# Patient Record
Sex: Female | Born: 1940 | Race: White | Hispanic: No | Smoking: Former smoker
Health system: Southern US, Community
[De-identification: ages and names within clinical notes are randomized; demographics above are authoritative.]

## PROBLEM LIST (undated history)

## (undated) DIAGNOSIS — IMO0001 Reserved for inherently not codable concepts without codable children: Secondary | ICD-10-CM

## (undated) DIAGNOSIS — Z9289 Personal history of other medical treatment: Secondary | ICD-10-CM

## (undated) DIAGNOSIS — D649 Anemia, unspecified: Secondary | ICD-10-CM

## (undated) DIAGNOSIS — E039 Hypothyroidism, unspecified: Secondary | ICD-10-CM

## (undated) DIAGNOSIS — F17200 Nicotine dependence, unspecified, uncomplicated: Secondary | ICD-10-CM

## (undated) DIAGNOSIS — C801 Malignant (primary) neoplasm, unspecified: Secondary | ICD-10-CM

## (undated) DIAGNOSIS — J189 Pneumonia, unspecified organism: Secondary | ICD-10-CM

## (undated) DIAGNOSIS — C3490 Malignant neoplasm of unspecified part of unspecified bronchus or lung: Secondary | ICD-10-CM

## (undated) DIAGNOSIS — E785 Hyperlipidemia, unspecified: Secondary | ICD-10-CM

## (undated) DIAGNOSIS — F419 Anxiety disorder, unspecified: Secondary | ICD-10-CM

## (undated) DIAGNOSIS — E119 Type 2 diabetes mellitus without complications: Secondary | ICD-10-CM

## (undated) DIAGNOSIS — I10 Essential (primary) hypertension: Secondary | ICD-10-CM

## (undated) HISTORY — DX: Hyperlipidemia, unspecified: E78.5

## (undated) HISTORY — DX: Pneumonia, unspecified organism: J18.9

## (undated) HISTORY — DX: Type 2 diabetes mellitus without complications: E11.9

## (undated) HISTORY — DX: Essential (primary) hypertension: I10

## (undated) HISTORY — DX: Hypothyroidism, unspecified: E03.9

## (undated) HISTORY — DX: Anxiety disorder, unspecified: F41.9

## (undated) HISTORY — DX: Nicotine dependence, unspecified, uncomplicated: F17.200

## (undated) HISTORY — PX: EYE SURGERY: SHX253

---

## 2005-02-11 DIAGNOSIS — J189 Pneumonia, unspecified organism: Secondary | ICD-10-CM

## 2005-02-11 HISTORY — DX: Pneumonia, unspecified organism: J18.9

## 2014-09-26 DIAGNOSIS — C3491 Malignant neoplasm of unspecified part of right bronchus or lung: Secondary | ICD-10-CM | POA: Insufficient documentation

## 2014-10-13 ENCOUNTER — Ambulatory Visit (HOSPITAL_BASED_OUTPATIENT_CLINIC_OR_DEPARTMENT_OTHER): Payer: Medicare Other | Admitting: Hematology

## 2014-10-13 ENCOUNTER — Other Ambulatory Visit: Payer: Self-pay | Admitting: Hematology

## 2014-10-13 ENCOUNTER — Ambulatory Visit: Payer: Self-pay | Admitting: Internal Medicine

## 2014-10-13 ENCOUNTER — Telehealth: Payer: Self-pay | Admitting: Hematology

## 2014-10-13 ENCOUNTER — Other Ambulatory Visit: Payer: Self-pay

## 2014-10-13 ENCOUNTER — Telehealth: Payer: Self-pay | Admitting: *Deleted

## 2014-10-13 ENCOUNTER — Encounter: Payer: Self-pay | Admitting: Hematology

## 2014-10-13 ENCOUNTER — Other Ambulatory Visit (HOSPITAL_BASED_OUTPATIENT_CLINIC_OR_DEPARTMENT_OTHER): Payer: Medicare Other

## 2014-10-13 ENCOUNTER — Other Ambulatory Visit: Payer: Self-pay | Admitting: *Deleted

## 2014-10-13 VITALS — BP 126/61 | HR 78 | Temp 98.4°F | Resp 18 | Ht 62.0 in | Wt 140.6 lb

## 2014-10-13 DIAGNOSIS — E46 Unspecified protein-calorie malnutrition: Secondary | ICD-10-CM

## 2014-10-13 DIAGNOSIS — C781 Secondary malignant neoplasm of mediastinum: Secondary | ICD-10-CM

## 2014-10-13 DIAGNOSIS — E875 Hyperkalemia: Secondary | ICD-10-CM

## 2014-10-13 DIAGNOSIS — C3490 Malignant neoplasm of unspecified part of unspecified bronchus or lung: Secondary | ICD-10-CM

## 2014-10-13 DIAGNOSIS — E039 Hypothyroidism, unspecified: Secondary | ICD-10-CM | POA: Diagnosis not present

## 2014-10-13 DIAGNOSIS — F419 Anxiety disorder, unspecified: Secondary | ICD-10-CM

## 2014-10-13 LAB — COMPREHENSIVE METABOLIC PANEL (CC13)
ALBUMIN: 3.6 g/dL (ref 3.5–5.0)
ALK PHOS: 42 U/L (ref 40–150)
ALT: 16 U/L (ref 0–55)
AST: 16 U/L (ref 5–34)
Anion Gap: 7 mEq/L (ref 3–11)
BILIRUBIN TOTAL: 0.45 mg/dL (ref 0.20–1.20)
BUN: 16.3 mg/dL (ref 7.0–26.0)
CO2: 28 meq/L (ref 22–29)
Calcium: 10.2 mg/dL (ref 8.4–10.4)
Chloride: 101 mEq/L (ref 98–109)
Creatinine: 1 mg/dL (ref 0.6–1.1)
EGFR: 57 mL/min/{1.73_m2} — ABNORMAL LOW (ref >90)
GLUCOSE: 132 mg/dL (ref 70–140)
Potassium: 5.6 mEq/L — ABNORMAL HIGH (ref 3.5–5.1)
SODIUM: 136 meq/L (ref 136–145)
TOTAL PROTEIN: 6.6 g/dL (ref 6.4–8.3)

## 2014-10-13 LAB — CBC & DIFF AND RETIC
BASO%: 0.2 % (ref 0.0–2.0)
BASOS ABS: 0 10*3/uL (ref 0.0–0.1)
EOS%: 0.9 % (ref 0.0–7.0)
Eosinophils Absolute: 0.1 10*3/uL (ref 0.0–0.5)
HEMATOCRIT: 35.6 % (ref 34.8–46.6)
HGB: 12.2 g/dL (ref 11.6–15.9)
IMMATURE RETIC FRACT: 4.7 % (ref 1.60–10.00)
LYMPH#: 1.6 10*3/uL (ref 0.9–3.3)
LYMPH%: 12.2 % — AB (ref 14.0–49.7)
MCH: 30.3 pg (ref 25.1–34.0)
MCHC: 34.3 g/dL (ref 31.5–36.0)
MCV: 88.6 fL (ref 79.5–101.0)
MONO#: 0.7 10*3/uL (ref 0.1–0.9)
MONO%: 5.6 % (ref 0.0–14.0)
NEUT#: 10.8 10*3/uL — ABNORMAL HIGH (ref 1.5–6.5)
NEUT%: 81.1 % — AB (ref 38.4–76.8)
PLATELETS: 189 10*3/uL (ref 145–400)
RBC: 4.02 10*6/uL (ref 3.70–5.45)
RDW: 14.1 % (ref 11.2–14.5)
RETIC CT ABS: 67.13 10*3/uL (ref 33.70–90.70)
Retic %: 1.67 % (ref 0.70–2.10)
WBC: 13.3 10*3/uL — ABNORMAL HIGH (ref 3.9–10.3)

## 2014-10-13 LAB — LACTATE DEHYDROGENASE (CC13): LDH: 189 U/L (ref 125–245)

## 2014-10-13 LAB — MAGNESIUM (CC13): Magnesium: 2 mg/dl (ref 1.5–2.5)

## 2014-10-13 MED ORDER — ONDANSETRON HCL 4 MG PO TABS: 4.0000 mg | ORAL_TABLET | Freq: Three times a day (TID) | ORAL | Status: DC | PRN

## 2014-10-13 MED ORDER — LORAZEPAM 0.5 MG PO TABS: 0.5000 mg | ORAL_TABLET | Freq: Three times a day (TID) | ORAL | Status: DC | PRN

## 2014-10-13 MED ORDER — CITALOPRAM HYDROBROMIDE 10 MG PO TABS: 10.0000 mg | ORAL_TABLET | Freq: Every day | ORAL | Status: DC

## 2014-10-13 NOTE — Progress Notes (Signed)
Patient suffers from hip pains and ambulatory issues which impairs their ability to perform daily activities like ambulating in the home. A walker, can, or crutch will not resolve  issue with performing activities of daily living. A wheelchair will allow patient to safely perform daily activities. Patient is not able to propel themselves in the home using a standard weight wheelchair due to weakness. Patient can self propel in the lightweight wheelchair.  Accessories: elevating leg rests (ELRs), wheel locks, extensions and anti-tippers.

## 2014-10-13 NOTE — Telephone Encounter (Signed)
Gave patient avs report and appointments for September including scans. Tx added per pof - no care plan at this time. Due to holiday and dates for scans and port tx to start 10/24/14 - GK aware

## 2014-10-13 NOTE — Telephone Encounter (Signed)
Per staff message and POF I have scheduled appts. Advised scheduler of appts. JMW  

## 2014-10-13 NOTE — Telephone Encounter (Signed)
per [place] will call to set up scans with pt

## 2014-10-13 NOTE — Telephone Encounter (Signed)
New chemo approved per maggie/lashonya and will have patient get a new schedule 9/2 at chemo class

## 2014-10-14 ENCOUNTER — Other Ambulatory Visit: Payer: Medicare Other

## 2014-10-17 ENCOUNTER — Encounter: Payer: Self-pay | Admitting: Hematology

## 2014-10-17 NOTE — Progress Notes (Signed)
Marland Kitchen    HEMATOLOGY/ONCOLOGY CONSULTATION NOTE  Date of Service: 10/17/2014  CHIEF COMPLAINTS/PURPOSE OF CONSULTATION:  Newly diagnosed small cell carcinoma of the lung for further evaluation and management.  HISTORY OF PRESENTING ILLNESS:  Rachel Chapman is a wonderful 74 y.o. female who has been referred to [place] by Dr Marikay Alar Kopstick and Dr Man Bernie Covey of [facility] further evaluation and management of newly diagnosed small cell lung cancer.  Patient has a history of heavy smoking one and 1/2 pack per day for 60 years, hypertension, dyslipidemia, diabetes, hypothyroidism and significant anxiety.   She presented to the [facility] in [place] with 1-2 months of progressive shortness of breath, fatigue and weakness, dizziness and dehydration with a weight loss of about 15-20 pounds. She had a CT scan of the chest on 09/26/2014 which showed enlarged mediastinal lymph nodes.  She subsequently had a bronchoscopic biopsy of the subcarinal lymph nodes which were report showed a small cell lung carcinoma.  She subsequently moved from [place] to [place] to live with her daughter and son and get her further treatment here.  Patient is noted to be very anxious and notes that she even gets jittery.  She does not have an official diagnosis of COPD but notes that she was prescribed albuterol inhaler which she has been using as needed. She notes that she quit smoking about one month ago.  She notes that she just moved and is living with her daughter she is eating a little better and has gained back some of her lost weight.  She notes no headaches no focal neurological deficits.  Notes some element of anorexia and fatigue. It is very anxious about what additional workup and treatment she might need as requesting some antianxiety medicines.     MEDICAL HISTORY:  Past Medical History  Diagnosis Date  . Hypertension   . Dyslipidemia   . Diabetes   .  Hypothyroidism   . Pneumonia 2007  . Smoker     1.5 packs per day for 60 years.  Quit one month ago  . Anxiety     SURGICAL HISTORY: Past Surgical History  Procedure Laterality Date  . Cesarean section      SOCIAL HISTORY: Social History   Social History  . Marital Status: Widowed    Spouse Name: N/A  . Number of Children: N/A  . Years of Education: N/A   Occupational History  . Not on file.   Social History Main Topics  . Smoking status: Former Smoker -- 1.50 packs/day for 60 years    Quit date: 09/12/2014  . Smokeless tobacco: Not on file  . Alcohol Use: No  . Drug Use: No  . Sexual Activity: No   Other Topics Concern  . 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 Cancer Neg Hx   . Clotting disorder Neg Hx     ALLERGIES:  has No Known Allergies.  MEDICATIONS:  Current Outpatient Prescriptions  Medication Sig Dispense Refill  . albuterol (PROVENTIL HFA;VENTOLIN HFA) 108 (90 BASE) MCG/ACT inhaler Inhale 1 puff into the lungs every 6 (six) hours as needed.    . cholecalciferol (VITAMIN D) 1000 UNITS tablet Take 1,000 Units by mouth daily.    Marland Kitchen levothyroxine (SYNTHROID, LEVOTHROID) 112 MCG tablet Take 112 mcg by mouth 2 (two) times daily.    Marland Kitchen  Multiple Vitamin (MULTIVITAMIN) tablet Take 1 tablet by mouth daily.    . nebivolol (BYSTOLIC) 5 MG tablet Take 5 mg by mouth 2 (two) times daily.    . simvastatin (ZOCOR) 20 MG tablet Take 20 mg by mouth daily.    . sitaGLIPtin (JANUVIA) 100 MG tablet Take 100 mg by mouth daily.    . vitamin B-12 (CYANOCOBALAMIN) 1000 MCG tablet Take 1,000 mcg by mouth daily.    . citalopram (CELEXA) 10 MG tablet Take 1 tablet (10 mg total) by mouth daily. 30 tablet 1  . LORazepam (ATIVAN) 0.5 MG tablet Take 1 tablet (0.5 mg total) by mouth every 8 (eight) hours as needed for anxiety (or nausea). Could take 2 tab 30-45 mins  prior to PET/CT and MRI Brain 60 tablet 0  . ondansetron (ZOFRAN) 4 MG tablet Take 1 tablet (4 mg total) by mouth every 8 (eight) hours as needed for nausea or vomiting. 30 tablet 0   No current facility-administered medications for this visit.     REVIEW OF SYSTEMS:    10 Point review of Systems was done is negative except as noted above.  PHYSICAL EXAMINATION: ECOG PERFORMANCE STATUS: 1 - Symptomatic but completely ambulatory  . Filed Vitals:   10/13/14 0840  Height: '5\' 2"'$  (1.575 m)  Weight: 140 lb 9.6 oz (63.776 kg)   Filed Weights   10/13/14 0840  Weight: 140 lb 9.6 oz (63.776 kg)   .Body mass index is 25.71 kg/(m^2).  GENERAL:alert, in no acute distress and comfortable SKIN: skin color, texture, turgor are normal, no rashes or significant lesions EYES: normal, conjunctiva are pink and non-injected, sclera clear OROPHARYNX:no exudate, no erythema and lips, buccal mucosa, and tongue normal  NECK: supple, no JVD, thyroid normal size, non-tender, without nodularity LYMPH:  no palpable lymphadenopathy in the cervical, axillary or inguinal LUNGS: clear to auscultation with normal respiratory effort HEART: regular rate & rhythm,  no murmurs and no lower extremity edema ABDOMEN: abdomen soft, non-tender, normoactive bowel sounds  Musculoskeletal: no cyanosis of digits and no clubbing  PSYCH: alert & oriented x 3 with fluent speech NEURO: no focal motor/sensory deficits  LABORATORY DATA:  I have reviewed the data as listed  . CBC Latest Ref Rng 10/13/2014  WBC 3.9 - 10.3 10e3/uL 13.3(H)  Hemoglobin 11.6 - 15.9 g/dL 12.2  Hematocrit 34.8 - 46.6 % 35.6  Platelets 145 - 400 10e3/uL 189    . CMP Latest Ref Rng 10/13/2014  Glucose 70 - 140 mg/dl 132  BUN 7.0 - 26.0 mg/dL 16.3  Creatinine 0.6 - 1.1 mg/dL 1.0  Sodium 136 - 145 mEq/L 136  Potassium 3.5 - 5.1 mEq/L 5.6(H)  CO2 22 - 29 mEq/L 28  Calcium 8.4 - 10.4 mg/dL 10.2  Total Protein 6.4 - 8.3 g/dL 6.6  Total Bilirubin  0.20 - 1.20 mg/dL 0.45  Alkaline Phos 40 - 150 U/L 42  AST 5 - 34 U/L 16  ALT 0 - 55 U/L 16     RADIOGRAPHIC STUDIES:  CT chest without contrast [09/26/2014]-done at [facility].  There is left apical scarring with fibrous retraction that is grossly unchanged from the prior examination. There is a tree in bud opacification within the anterior aspect of the right upper lung field measuring 8 mm x 6 mm. There is a large paratracheal lymph node mass that measures 2.3 cm x 2.8 cm with extension into the right hilum.  There is a 2.9 cm subcarinal lymphadenopathy. Gallstones noted within  the gallbladder. Impression Mediastinal lymphadenopathy indicative of neoplasm.  9 mm right apical nodule.  Apical pleural based thickening, left-sided, chronic in nature.  [facility] pathology report specimen #[id number] Final diagnosis A] subcarinal lymph node #1 ultrasound-guided cell block- Small cell carcinoma B] subcarinal lymph node #2 ultrasound-guided cell block- Small cell carcinoma  Comment: Immunohistochemistry studies were performed and results of both the diagnosis positive for CD 56, pancytokeratin and synaptophysin.  Negative for chromogranin, CD3 and CD 20.  ASSESSMENT & PLAN:   74 year old female with history of heavy smoking with  #1 Newly diagnosed small cell lung cancer -unknown staging at this time.  CT scan on 09/26/2014 at the outside hospital showed mediastinal adenopathy which was bronchoscopically biopsied and noted to be consistent with small cell lung cancer. No overt focal neurological deficits or symptoms suggestive of brain metastases at this time. Plan -PET/CT scan and MRI of the brain to complete staging as soon as possible.  This has been scheduled for 10/19/2014. -We will start treatment with carboplatin plus etoposide immediately after staging -Radiation oncology referral for consideration of concurrent radiation therapy if the  patient has limited stage small cell lung cancer.   -Patient was counseled to continue her smoking cessation.  She notes that she quit about a month ago. -We discussed the diagnosis in detail as well as the nature of the disease treatment options which would be further characterized after staging workup. -Given referral to nutritional therapy  #2 protein calorie malnutrition and weight loss due to small cell lung cancer. -Given referral to nutritional therapy.  #3 ex-smoker 90-pack-year history of smoking at one month ago.  #4 likely COPD has never been diagnosed as such.  He has been recently given an albuterol inhaler as needed.  Not oxygen dependent.  #5 hyperkalemia potassium 5.6.  Counseled patient to reduce potassium intake and increase oral fluid intake.patient has normal kidney function. Plan -A repeat potassium levels with labs in 5-6 days.  #6 hypothyroidism -continuity with levothyroxine replacement  #7Diabetes patient has been controlled with oral hypoglycemics.  She has lost about 15-20 pounds. Plan Continue Januvia. -we will discontinue her metformin to reduce its anorexic effects. -Patient will monitor blood sugars at home  #8 severe anxiety.  Patient is having insomnia and difficulty with relaxing and is requesting medications for this. Plan -We'll start the patient on Celexa for long-term management of generalized anxiety. -Given prescription for lorazepam when necessary for acute anxiety attacks and as premedication for her MRI of the brain and PET/CT scan and  As needed for insomnia.   All of the patients and her family's questions were answered to their apparent satisfaction. The patient knows to call the clinic with any problems, questions or concerns.  I spent 60 minutes counseling the patient face to face. The total time spent in the appointment was 80 minutes and more than 50% was on counseling and direct patient cares.    Sullivan Lone MD [place] AAHIVMS [facility]  [facility] Hematology/Oncology Physician [facility]  (Office):       [phone] (Work cell):  [phone] (Fax):           [phone]  10/17/2014 7:17 PM

## 2014-10-18 ENCOUNTER — Encounter (HOSPITAL_COMMUNITY): Payer: Self-pay | Admitting: Pharmacist

## 2014-10-19 ENCOUNTER — Ambulatory Visit (HOSPITAL_COMMUNITY)
Admission: RE | Admit: 2014-10-19 | Discharge: 2014-10-19 | Disposition: A | Discharge status: Home / Self Care | Payer: Medicare Other | Source: Ambulatory Visit | Attending: Hematology | Admitting: Hematology

## 2014-10-19 ENCOUNTER — Other Ambulatory Visit: Payer: Self-pay | Admitting: Hematology

## 2014-10-19 ENCOUNTER — Ambulatory Visit (HOSPITAL_BASED_OUTPATIENT_CLINIC_OR_DEPARTMENT_OTHER): Payer: Medicare Other

## 2014-10-19 ENCOUNTER — Encounter (HOSPITAL_COMMUNITY)
Admission: RE | Admit: 2014-10-19 | Discharge: 2014-10-19 | Disposition: A | Discharge status: Home / Self Care | Payer: Medicare Other | Source: Ambulatory Visit | Attending: Hematology | Admitting: Hematology

## 2014-10-19 ENCOUNTER — Encounter: Payer: Self-pay | Admitting: General Practice

## 2014-10-19 VITALS — BP 136/66 | HR 69 | Temp 98.6°F | Resp 18

## 2014-10-19 DIAGNOSIS — M47896 Other spondylosis, lumbar region: Secondary | ICD-10-CM | POA: Insufficient documentation

## 2014-10-19 DIAGNOSIS — Z79899 Other long term (current) drug therapy: Secondary | ICD-10-CM | POA: Insufficient documentation

## 2014-10-19 DIAGNOSIS — C3490 Malignant neoplasm of unspecified part of unspecified bronchus or lung: Secondary | ICD-10-CM | POA: Diagnosis not present

## 2014-10-19 DIAGNOSIS — Z5111 Encounter for antineoplastic chemotherapy: Secondary | ICD-10-CM | POA: Diagnosis not present

## 2014-10-19 DIAGNOSIS — C781 Secondary malignant neoplasm of mediastinum: Secondary | ICD-10-CM | POA: Diagnosis not present

## 2014-10-19 DIAGNOSIS — R590 Localized enlarged lymph nodes: Secondary | ICD-10-CM | POA: Diagnosis not present

## 2014-10-19 DIAGNOSIS — I700 Atherosclerosis of aorta: Secondary | ICD-10-CM | POA: Insufficient documentation

## 2014-10-19 DIAGNOSIS — K8020 Calculus of gallbladder without cholecystitis without obstruction: Secondary | ICD-10-CM | POA: Diagnosis not present

## 2014-10-19 LAB — GLUCOSE, CAPILLARY: GLUCOSE-CAPILLARY: 190 mg/dL — AB (ref 65–99)

## 2014-10-19 MED ORDER — FLUDEOXYGLUCOSE F - 18 (FDG) INJECTION
6.6000 | Freq: Once | INTRAVENOUS | Status: DC | PRN
  Administered 2014-10-19: 6.6 via INTRAVENOUS
  Filled 2014-10-19: qty 6.6

## 2014-10-19 MED ORDER — SODIUM CHLORIDE 0.9 % IV SOLN
100.0000 mg/m2 | Freq: Once | INTRAVENOUS | Status: AC
  Administered 2014-10-19: 170 mg via INTRAVENOUS
  Filled 2014-10-19: qty 8.5

## 2014-10-19 MED ORDER — SODIUM CHLORIDE 0.9 % IV SOLN
366.0000 mg | Freq: Once | INTRAVENOUS | Status: AC
  Administered 2014-10-19: 370 mg via INTRAVENOUS
  Filled 2014-10-19: qty 37

## 2014-10-19 MED ORDER — GADOBENATE DIMEGLUMINE 529 MG/ML IV SOLN
12.0000 mL | Freq: Once | INTRAVENOUS | Status: AC | PRN
  Administered 2014-10-19: 12 mL via INTRAVENOUS

## 2014-10-19 MED ORDER — SODIUM CHLORIDE 0.9 % IV SOLN
Freq: Once | INTRAVENOUS | Status: AC
  Administered 2014-10-19: 11:00:00 via INTRAVENOUS

## 2014-10-19 MED ORDER — SODIUM CHLORIDE 0.9 % IV SOLN
Freq: Once | INTRAVENOUS | Status: AC
  Administered 2014-10-19: 11:00:00 via INTRAVENOUS
  Filled 2014-10-19: qty 8

## 2014-10-19 NOTE — Patient Instructions (Addendum)
[facility] Discharge Instructions for Patients Receiving Chemotherapy  Today you received the following chemotherapy agents Carboplatin and Etoposide.   To help prevent nausea and vomiting after your treatment, we encourage you to take your nausea medication Zofran 4 mg every 8 hours as needed.  needed. If you develop nausea and vomiting that is not controlled by your nausea medication, call the clinic.   BELOW ARE SYMPTOMS THAT SHOULD BE REPORTED IMMEDIATELY:  *FEVER GREATER THAN 100.5 F  *CHILLS WITH OR WITHOUT FEVER  NAUSEA AND VOMITING THAT IS NOT CONTROLLED WITH YOUR NAUSEA MEDICATION  *UNUSUAL SHORTNESS OF BREATH  *UNUSUAL BRUISING OR BLEEDING  TENDERNESS IN MOUTH AND THROAT WITH OR WITHOUT PRESENCE OF ULCERS  *URINARY PROBLEMS  *BOWEL PROBLEMS  UNUSUAL RASH Items with * indicate a potential emergency and should be followed up as soon as possible.  Feel free to call the clinic you have any questions or concerns. The clinic phone number is [phone].  Please show the [place] at check-in to the Emergency Department and triage nurse.  Etoposide, VP-16 injection What is this medicine? ETOPOSIDE, VP-16 (e toe POE side) is a chemotherapy drug. It is used to treat testicular cancer, lung cancer, and other cancers. This medicine may be used for other purposes; ask your health care provider or pharmacist if you have questions. COMMON BRAND NAME(S): Etopophos, Toposar, VePesid What should I tell my health care provider before I take this medicine? They need to know if you have any of these conditions: -infection -kidney disease -low blood counts, like low white cell, platelet, or red cell counts -an unusual or allergic reaction to etoposide, other chemotherapeutic agents, other medicines, foods, dyes, or preservatives -pregnant or trying to get pregnant -breast-feeding How should I use this medicine? This medicine is for infusion into a vein.  It is administered in a hospital or clinic by a specially trained health care professional. Talk to your pediatrician regarding the use of this medicine in children. Special care may be needed. Overdosage: If you think you have taken too much of this medicine contact a poison control center or emergency room at once. NOTE: This medicine is only for you. Do not share this medicine with others. What if I miss a dose? It is important not to miss your dose. Call your doctor or health care professional if you are unable to keep an appointment. What may interact with this medicine? -cyclosporine -medicines to increase blood counts like filgrastim, pegfilgrastim, sargramostim -vaccines This list may not describe all possible interactions. Give your health care provider a list of all the medicines, herbs, non-prescription drugs, or dietary supplements you use. Also tell them if you smoke, drink alcohol, or use illegal drugs. Some items may interact with your medicine. What should I watch for while using this medicine? Visit your doctor for checks on your progress. This drug may make you feel generally unwell. This is not uncommon, as chemotherapy can affect healthy cells as well as cancer cells. Report any side effects. Continue your course of treatment even though you feel ill unless your doctor tells you to stop. In some cases, you may be given additional medicines to help with side effects. Follow all directions for their use. Call your doctor or health care professional for advice if you get a fever, chills or sore throat, or other symptoms of a cold or flu. Do not treat yourself. This drug decreases your body's ability to fight infections. Try to avoid being  around people who are sick. This medicine may increase your risk to bruise or bleed. Call your doctor or health care professional if you notice any unusual bleeding. Be careful brushing and flossing your teeth or using a toothpick because you may  get an infection or bleed more easily. If you have any dental work done, tell your dentist you are receiving this medicine. Avoid taking products that contain aspirin, acetaminophen, ibuprofen, naproxen, or ketoprofen unless instructed by your doctor. These medicines may hide a fever. Do not become pregnant while taking this medicine. Women should inform their doctor if they wish to become pregnant or think they might be pregnant. There is a potential for serious side effects to an unborn child. Talk to your health care professional or pharmacist for more information. Do not breast-feed an infant while taking this medicine. What side effects may I notice from receiving this medicine? Side effects that you should report to your doctor or health care professional as soon as possible: -allergic reactions like skin rash, itching or hives, swelling of the face, lips, or tongue -low blood counts - this medicine may decrease the number of white blood cells, red blood cells and platelets. You may be at increased risk for infections and bleeding. -signs of infection - fever or chills, cough, sore throat, pain or difficulty passing urine -signs of decreased platelets or bleeding - bruising, pinpoint red spots on the skin, black, tarry stools, blood in the urine -signs of decreased red blood cells - unusually weak or tired, fainting spells, lightheadedness -breathing problems -changes in vision -mouth or throat sores or ulcers -pain, redness, swelling or irritation at the injection site -pain, tingling, numbness in the hands or feet -redness, blistering, peeling or loosening of the skin, including inside the mouth -seizures -vomiting Side effects that usually do not require medical attention (report to your doctor or health care professional if they continue or are bothersome): -diarrhea -hair loss -loss of appetite -nausea -stomach pain This list may not describe all possible side effects. Call your  doctor for medical advice about side effects. You may report side effects to FDA at 1-800-FDA-1088. Where should I keep my medicine? This drug is given in a hospital or clinic and will not be stored at home. NOTE: This sheet is a summary. It may not cover all possible information. If you have questions about this medicine, talk to your doctor, pharmacist, or health care provider.  2015, Elsevier/Gold Standard. (2007-06-01 17:24:12) Carboplatin injection What is this medicine? CARBOPLATIN (KAR boe pla tin) is a chemotherapy drug. It targets fast dividing cells, like cancer cells, and causes these cells to die. This medicine is used to treat ovarian cancer and many other cancers. This medicine may be used for other purposes; ask your health care provider or pharmacist if you have questions. COMMON BRAND NAME(S): Paraplatin What should I tell my health care provider before I take this medicine? They need to know if you have any of these conditions: -blood disorders -hearing problems -kidney disease -recent or ongoing radiation therapy -an unusual or allergic reaction to carboplatin, cisplatin, other chemotherapy, other medicines, foods, dyes, or preservatives -pregnant or trying to get pregnant -breast-feeding How should I use this medicine? This drug is usually given as an infusion into a vein. It is administered in a hospital or clinic by a specially trained health care professional. Talk to your pediatrician regarding the use of this medicine in children. Special care may be needed. Overdosage: If you think you  have taken too much of this medicine contact a poison control center or emergency room at once. NOTE: This medicine is only for you. Do not share this medicine with others. What if I miss a dose? It is important not to miss a dose. Call your doctor or health care professional if you are unable to keep an appointment. What may interact with this medicine? -medicines for  seizures -medicines to increase blood counts like filgrastim, pegfilgrastim, sargramostim -some antibiotics like amikacin, gentamicin, neomycin, streptomycin, tobramycin -vaccines Talk to your doctor or health care professional before taking any of these medicines: -acetaminophen -aspirin -ibuprofen -ketoprofen -naproxen This list may not describe all possible interactions. Give your health care provider a list of all the medicines, herbs, non-prescription drugs, or dietary supplements you use. Also tell them if you smoke, drink alcohol, or use illegal drugs. Some items may interact with your medicine. What should I watch for while using this medicine? Your condition will be monitored carefully while you are receiving this medicine. You will need important blood work done while you are taking this medicine. This drug may make you feel generally unwell. This is not uncommon, as chemotherapy can affect healthy cells as well as cancer cells. Report any side effects. Continue your course of treatment even though you feel ill unless your doctor tells you to stop. In some cases, you may be given additional medicines to help with side effects. Follow all directions for their use. Call your doctor or health care professional for advice if you get a fever, chills or sore throat, or other symptoms of a cold or flu. Do not treat yourself. This drug decreases your body's ability to fight infections. Try to avoid being around people who are sick. This medicine may increase your risk to bruise or bleed. Call your doctor or health care professional if you notice any unusual bleeding. Be careful brushing and flossing your teeth or using a toothpick because you may get an infection or bleed more easily. If you have any dental work done, tell your dentist you are receiving this medicine. Avoid taking products that contain aspirin, acetaminophen, ibuprofen, naproxen, or ketoprofen unless instructed by your doctor.  These medicines may hide a fever. Do not become pregnant while taking this medicine. Women should inform their doctor if they wish to become pregnant or think they might be pregnant. There is a potential for serious side effects to an unborn child. Talk to your health care professional or pharmacist for more information. Do not breast-feed an infant while taking this medicine. What side effects may I notice from receiving this medicine? Side effects that you should report to your doctor or health care professional as soon as possible: -allergic reactions like skin rash, itching or hives, swelling of the face, lips, or tongue -signs of infection - fever or chills, cough, sore throat, pain or difficulty passing urine -signs of decreased platelets or bleeding - bruising, pinpoint red spots on the skin, black, tarry stools, nosebleeds -signs of decreased red blood cells - unusually weak or tired, fainting spells, lightheadedness -breathing problems -changes in hearing -changes in vision -chest pain -high blood pressure -low blood counts - This drug may decrease the number of white blood cells, red blood cells and platelets. You may be at increased risk for infections and bleeding. -nausea and vomiting -pain, swelling, redness or irritation at the injection site -pain, tingling, numbness in the hands or feet -problems with balance, talking, walking -trouble passing urine or change in  the amount of urine Side effects that usually do not require medical attention (report to your doctor or health care professional if they continue or are bothersome): -hair loss -loss of appetite -metallic taste in the mouth or changes in taste This list may not describe all possible side effects. Call your doctor for medical advice about side effects. You may report side effects to FDA at 1-800-FDA-1088. Where should I keep my medicine? This drug is given in a hospital or clinic and will not be stored at  home. NOTE: This sheet is a summary. It may not cover all possible information. If you have questions about this medicine, talk to your doctor, pharmacist, or health care provider.  2015, Elsevier/Gold Standard. (2007-05-05 14:38:05)

## 2014-10-19 NOTE — Progress Notes (Signed)
Met Ms Rachel Chapman, who goes by "Rachel Chapman" (spelling?), with her son and daughter-in-law in chemo class, following up with each of them today for spiritual and emotional support.Rachel Chapman was very tearful and almost unable to engage in chemo class because of her fears about dying and "being a burden" on her family; at her family's encouragement, she took a xanax to cope with her high level of anxiety.  Per family, this anxiety is not typical for her.  Since dx, Rachel Chapman has relocated from home in [place] to son Rachel Chapman and DIL Rachel Chapman's home for support.  Per DIL, pt also reports weakness and wobbliness in her legs, as well as lightheadedness, causing her to avoid walking; per DIL, pt's baseline is all independent ADLs.  Per DIL, this is of concern significant enough to seek MD/psych consultation.  Family appears to have loving and affectionate rapport.  Rachel Chapman and Rachel Chapman appear very supportive and concerned; Rachel Chapman reports high caregiving stress level, particularly with pt's sudden decrease in independence (with walking, showering, etc) and distress at pt's talking about dying in front of Rachel Chapman and Rachel Chapman's teenage daughters.  Family welcomes chaplain support; son has met Counseling Intern Rachel Chapman, as well.  Per pt, she keeps her prayer shawl (from hospital volunteers, via chaplain) with her at all times at home, which has become a source of comfort.  Following for support and encouragement, but please also page as needs arise, including anxiety at appointments/in infusion.  Thank you!  [place], [place] Pager [phone] Voicemail  [phone]

## 2014-10-20 ENCOUNTER — Ambulatory Visit (HOSPITAL_BASED_OUTPATIENT_CLINIC_OR_DEPARTMENT_OTHER): Payer: Medicare Other | Admitting: Hematology

## 2014-10-20 ENCOUNTER — Ambulatory Visit (HOSPITAL_BASED_OUTPATIENT_CLINIC_OR_DEPARTMENT_OTHER): Payer: Medicare Other

## 2014-10-20 ENCOUNTER — Encounter: Payer: Self-pay | Admitting: General Practice

## 2014-10-20 ENCOUNTER — Other Ambulatory Visit (HOSPITAL_BASED_OUTPATIENT_CLINIC_OR_DEPARTMENT_OTHER): Payer: Medicare Other

## 2014-10-20 ENCOUNTER — Other Ambulatory Visit: Payer: Self-pay | Admitting: Radiology

## 2014-10-20 ENCOUNTER — Encounter: Payer: Self-pay | Admitting: Hematology

## 2014-10-20 VITALS — BP 137/65 | HR 76 | Temp 97.7°F | Resp 18 | Ht 62.0 in | Wt 139.0 lb

## 2014-10-20 DIAGNOSIS — C3490 Malignant neoplasm of unspecified part of unspecified bronchus or lung: Secondary | ICD-10-CM

## 2014-10-20 DIAGNOSIS — C781 Secondary malignant neoplasm of mediastinum: Secondary | ICD-10-CM

## 2014-10-20 DIAGNOSIS — E875 Hyperkalemia: Secondary | ICD-10-CM | POA: Diagnosis not present

## 2014-10-20 DIAGNOSIS — E039 Hypothyroidism, unspecified: Secondary | ICD-10-CM

## 2014-10-20 DIAGNOSIS — Z5111 Encounter for antineoplastic chemotherapy: Secondary | ICD-10-CM | POA: Diagnosis not present

## 2014-10-20 DIAGNOSIS — E119 Type 2 diabetes mellitus without complications: Secondary | ICD-10-CM

## 2014-10-20 LAB — CBC & DIFF AND RETIC
BASO%: 0.2 % (ref 0.0–2.0)
Basophils Absolute: 0 10*3/uL (ref 0.0–0.1)
EOS ABS: 0.1 10*3/uL (ref 0.0–0.5)
EOS%: 0.6 % (ref 0.0–7.0)
HCT: 35.4 % (ref 34.8–46.6)
HGB: 12.8 g/dL (ref 11.6–15.9)
IMMATURE RETIC FRACT: 3.2 % (ref 1.60–10.00)
LYMPH#: 1.6 10*3/uL (ref 0.9–3.3)
LYMPH%: 14.2 % (ref 14.0–49.7)
MCH: 30.6 pg (ref 25.1–34.0)
MCHC: 36.2 g/dL — ABNORMAL HIGH (ref 31.5–36.0)
MCV: 84.7 fL (ref 79.5–101.0)
MONO#: 1.4 10*3/uL — AB (ref 0.1–0.9)
MONO%: 12.2 % (ref 0.0–14.0)
NEUT%: 72.8 % (ref 38.4–76.8)
NEUTROS ABS: 8.3 10*3/uL — AB (ref 1.5–6.5)
Platelets: 283 10*3/uL (ref 145–400)
RBC: 4.18 10*6/uL (ref 3.70–5.45)
RDW: 14.4 % (ref 11.2–14.5)
RETIC CT ABS: 94.05 10*3/uL — AB (ref 33.70–90.70)
Retic %: 2.25 % — ABNORMAL HIGH (ref 0.70–2.10)
WBC: 11.4 10*3/uL — AB (ref 3.9–10.3)

## 2014-10-20 LAB — COMPREHENSIVE METABOLIC PANEL (CC13)
ALT: 26 U/L (ref 0–55)
AST: 19 U/L (ref 5–34)
Albumin: 3.7 g/dL (ref 3.5–5.0)
Alkaline Phosphatase: 47 U/L (ref 40–150)
Anion Gap: 10 mEq/L (ref 3–11)
BILIRUBIN TOTAL: 0.67 mg/dL (ref 0.20–1.20)
BUN: 17.7 mg/dL (ref 7.0–26.0)
CO2: 24 meq/L (ref 22–29)
CREATININE: 1 mg/dL (ref 0.6–1.1)
Calcium: 9.8 mg/dL (ref 8.4–10.4)
Chloride: 97 mEq/L — ABNORMAL LOW (ref 98–109)
EGFR: 55 mL/min/{1.73_m2} — ABNORMAL LOW (ref >90)
GLUCOSE: 178 mg/dL — AB (ref 70–140)
Potassium: 4.4 mEq/L (ref 3.5–5.1)
SODIUM: 130 meq/L — AB (ref 136–145)
TOTAL PROTEIN: 6.6 g/dL (ref 6.4–8.3)

## 2014-10-20 LAB — URIC ACID (CC13): Uric Acid, Serum: 5.3 mg/dl (ref 2.6–7.4)

## 2014-10-20 MED ORDER — SODIUM CHLORIDE 0.9 % IV SOLN
100.0000 mg/m2 | Freq: Once | INTRAVENOUS | Status: AC
  Administered 2014-10-20: 170 mg via INTRAVENOUS
  Filled 2014-10-20: qty 8.5

## 2014-10-20 MED ORDER — PROCHLORPERAZINE MALEATE 10 MG PO TABS
ORAL_TABLET | ORAL | Status: AC
  Filled 2014-10-20: qty 1

## 2014-10-20 MED ORDER — LIDOCAINE-PRILOCAINE 2.5-2.5 % EX KIT: PACK | Freq: Once | CUTANEOUS | Status: DC

## 2014-10-20 MED ORDER — PROCHLORPERAZINE MALEATE 10 MG PO TABS
10.0000 mg | ORAL_TABLET | Freq: Once | ORAL | Status: AC
  Administered 2014-10-20: 10 mg via ORAL

## 2014-10-20 MED ORDER — SODIUM CHLORIDE 0.9 % IV SOLN
Freq: Once | INTRAVENOUS | Status: AC
  Administered 2014-10-20: 15:00:00 via INTRAVENOUS

## 2014-10-20 NOTE — Patient Instructions (Signed)
[facility] Discharge Instructions for Patients Receiving Chemotherapy  Today you received the following chemotherapy agents: Etoposide   To help prevent nausea and vomiting after your treatment, we encourage you to take your nausea medication as directed.    If you develop nausea and vomiting that is not controlled by your nausea medication, call the clinic.   BELOW ARE SYMPTOMS THAT SHOULD BE REPORTED IMMEDIATELY:  *FEVER GREATER THAN 100.5 F  *CHILLS WITH OR WITHOUT FEVER  NAUSEA AND VOMITING THAT IS NOT CONTROLLED WITH YOUR NAUSEA MEDICATION  *UNUSUAL SHORTNESS OF BREATH  *UNUSUAL BRUISING OR BLEEDING  TENDERNESS IN MOUTH AND THROAT WITH OR WITHOUT PRESENCE OF ULCERS  *URINARY PROBLEMS  *BOWEL PROBLEMS  UNUSUAL RASH Items with * indicate a potential emergency and should be followed up as soon as possible.  Feel free to call the clinic you have any questions or concerns. The clinic phone number is [phone].  Please show the CHEMO ALERT CARD at check-in to the Emergency Department and triage nurse.   

## 2014-10-20 NOTE — Progress Notes (Signed)
Spiritual Care Note  Provided further emotional and logistical support to pt ("Rachel Chapman"), son Wille Glaser, and daughter-in-law Langley Gauss.  They all celebrated that Rachel Chapman has had more energy/appetite and much less distress, which Rachel Chapman's affect reflected today, as well.  Per family, they are grateful that PET scan showed no mets.  Langley Gauss is helping Rachel Chapman prepare emotionally for hair loss; to that end, I shared hair regrowth photos, gave Rachel Chapman a knitted cap, and encouraged facing that change in small steps.  Also provided lung cancer print materials about phone and online support resources, as well as information about [employer] support for teens (Rachel Chapman's granddaughters) because family has expressed repeated concern about emotional care for them as they navigate fears and worries.  Family very appreciative of chaplain support, encouragement, and engagement.  Following, and family aware of ongoing chaplain availability, but please also page as needs arise.  Thank you.  [place], [place] Pager [phone] Voicemail [phone]

## 2014-10-21 ENCOUNTER — Other Ambulatory Visit: Payer: Self-pay | Admitting: Hematology

## 2014-10-21 ENCOUNTER — Other Ambulatory Visit: Payer: Medicare Other

## 2014-10-21 ENCOUNTER — Ambulatory Visit: Payer: Medicare Other | Admitting: Hematology

## 2014-10-21 ENCOUNTER — Telehealth: Payer: Self-pay | Admitting: Hematology

## 2014-10-21 ENCOUNTER — Ambulatory Visit (HOSPITAL_COMMUNITY)
Admission: RE | Admit: 2014-10-21 | Discharge: 2014-10-21 | Disposition: A | Discharge status: Home / Self Care | Payer: Medicare Other | Source: Ambulatory Visit | Attending: Hematology | Admitting: Hematology

## 2014-10-21 ENCOUNTER — Encounter (HOSPITAL_COMMUNITY): Payer: Self-pay

## 2014-10-21 ENCOUNTER — Ambulatory Visit (HOSPITAL_BASED_OUTPATIENT_CLINIC_OR_DEPARTMENT_OTHER): Payer: Medicare Other

## 2014-10-21 ENCOUNTER — Encounter: Payer: Self-pay | Admitting: Radiation Oncology

## 2014-10-21 VITALS — BP 114/79 | HR 82 | Temp 98.6°F | Resp 18

## 2014-10-21 DIAGNOSIS — Z87891 Personal history of nicotine dependence: Secondary | ICD-10-CM | POA: Diagnosis not present

## 2014-10-21 DIAGNOSIS — C3490 Malignant neoplasm of unspecified part of unspecified bronchus or lung: Secondary | ICD-10-CM

## 2014-10-21 DIAGNOSIS — Z5111 Encounter for antineoplastic chemotherapy: Secondary | ICD-10-CM

## 2014-10-21 DIAGNOSIS — E119 Type 2 diabetes mellitus without complications: Secondary | ICD-10-CM | POA: Insufficient documentation

## 2014-10-21 DIAGNOSIS — F419 Anxiety disorder, unspecified: Secondary | ICD-10-CM | POA: Insufficient documentation

## 2014-10-21 DIAGNOSIS — C779 Secondary and unspecified malignant neoplasm of lymph node, unspecified: Secondary | ICD-10-CM | POA: Diagnosis not present

## 2014-10-21 DIAGNOSIS — E785 Hyperlipidemia, unspecified: Secondary | ICD-10-CM | POA: Diagnosis not present

## 2014-10-21 DIAGNOSIS — E039 Hypothyroidism, unspecified: Secondary | ICD-10-CM | POA: Diagnosis not present

## 2014-10-21 DIAGNOSIS — C781 Secondary malignant neoplasm of mediastinum: Secondary | ICD-10-CM

## 2014-10-21 DIAGNOSIS — I10 Essential (primary) hypertension: Secondary | ICD-10-CM | POA: Insufficient documentation

## 2014-10-21 DIAGNOSIS — C3491 Malignant neoplasm of unspecified part of right bronchus or lung: Secondary | ICD-10-CM | POA: Diagnosis present

## 2014-10-21 HISTORY — DX: Malignant (primary) neoplasm, unspecified: C80.1

## 2014-10-21 LAB — CBC WITH DIFFERENTIAL/PLATELET
BASOS ABS: 0 10*3/uL (ref 0.0–0.1)
Basophils Relative: 0 % (ref 0–1)
EOS PCT: 1 % (ref 0–5)
Eosinophils Absolute: 0.2 10*3/uL (ref 0.0–0.7)
HCT: 36.9 % (ref 36.0–46.0)
Hemoglobin: 12.9 g/dL (ref 12.0–15.0)
Lymphocytes Relative: 12 % (ref 12–46)
Lymphs Abs: 1.4 10*3/uL (ref 0.7–4.0)
MCH: 30.4 pg (ref 26.0–34.0)
MCHC: 35 g/dL (ref 30.0–36.0)
MCV: 87 fL (ref 78.0–100.0)
MONO ABS: 0.7 10*3/uL (ref 0.1–1.0)
Monocytes Relative: 7 % (ref 3–12)
NEUTROS PCT: 80 % — AB (ref 43–77)
Neutro Abs: 8.9 10*3/uL — ABNORMAL HIGH (ref 1.7–7.7)
PLATELETS: 296 10*3/uL (ref 150–400)
RBC: 4.24 MIL/uL (ref 3.87–5.11)
RDW: 14.4 % (ref 11.5–15.5)
WBC: 11.2 10*3/uL — ABNORMAL HIGH (ref 4.0–10.5)

## 2014-10-21 LAB — BASIC METABOLIC PANEL
Anion gap: 8 (ref 5–15)
BUN: 22 mg/dL — ABNORMAL HIGH (ref 6–20)
CHLORIDE: 94 mmol/L — AB (ref 101–111)
CO2: 26 mmol/L (ref 22–32)
CREATININE: 0.93 mg/dL (ref 0.44–1.00)
Calcium: 9.5 mg/dL (ref 8.9–10.3)
GFR calc non Af Amer: 58 mL/min — ABNORMAL LOW (ref >60)
Glucose, Bld: 174 mg/dL — ABNORMAL HIGH (ref 65–99)
POTASSIUM: 4.3 mmol/L (ref 3.5–5.1)
Sodium: 128 mmol/L — ABNORMAL LOW (ref 135–145)

## 2014-10-21 LAB — PROTIME-INR
INR: 0.91 (ref 0.00–1.49)
Prothrombin Time: 12.5 seconds (ref 11.6–15.2)

## 2014-10-21 LAB — GLUCOSE, CAPILLARY: GLUCOSE-CAPILLARY: 166 mg/dL — AB (ref 65–99)

## 2014-10-21 LAB — APTT: aPTT: 26 seconds (ref 24–37)

## 2014-10-21 MED ORDER — CEFAZOLIN SODIUM-DEXTROSE 2-3 GM-% IV SOLR
2.0000 g | INTRAVENOUS | Status: AC
  Administered 2014-10-21: 2 g via INTRAVENOUS

## 2014-10-21 MED ORDER — PROCHLORPERAZINE MALEATE 10 MG PO TABS
ORAL_TABLET | ORAL | Status: AC
  Filled 2014-10-21: qty 1

## 2014-10-21 MED ORDER — CEFAZOLIN SODIUM-DEXTROSE 2-3 GM-% IV SOLR
INTRAVENOUS | Status: AC
  Filled 2014-10-21: qty 50

## 2014-10-21 MED ORDER — MIDAZOLAM HCL 2 MG/2ML IJ SOLN
INTRAMUSCULAR | Status: AC
  Filled 2014-10-21: qty 4

## 2014-10-21 MED ORDER — PROCHLORPERAZINE MALEATE 10 MG PO TABS
10.0000 mg | ORAL_TABLET | Freq: Once | ORAL | Status: AC
  Administered 2014-10-21: 10 mg via ORAL

## 2014-10-21 MED ORDER — HEPARIN SOD (PORK) LOCK FLUSH 100 UNIT/ML IV SOLN
INTRAVENOUS | Status: AC
  Filled 2014-10-21: qty 5

## 2014-10-21 MED ORDER — FENTANYL CITRATE (PF) 100 MCG/2ML IJ SOLN
INTRAMUSCULAR | Status: AC
  Filled 2014-10-21: qty 2

## 2014-10-21 MED ORDER — HEPARIN SOD (PORK) LOCK FLUSH 100 UNIT/ML IV SOLN
500.0000 [IU] | Freq: Once | INTRAVENOUS | Status: AC | PRN
  Administered 2014-10-21: 500 [IU]
  Filled 2014-10-21: qty 5

## 2014-10-21 MED ORDER — SODIUM CHLORIDE 0.9 % IV SOLN
Freq: Once | INTRAVENOUS | Status: AC
  Administered 2014-10-21: 13:00:00 via INTRAVENOUS

## 2014-10-21 MED ORDER — LIDOCAINE HCL 1 % IJ SOLN
INTRAMUSCULAR | Status: AC
  Filled 2014-10-21: qty 20

## 2014-10-21 MED ORDER — ETOPOSIDE CHEMO INJECTION 1 GM/50ML
100.0000 mg/m2 | Freq: Once | INTRAVENOUS | Status: AC
  Administered 2014-10-21: 170 mg via INTRAVENOUS
  Filled 2014-10-21: qty 8.5

## 2014-10-21 MED ORDER — FENTANYL CITRATE (PF) 100 MCG/2ML IJ SOLN
INTRAMUSCULAR | Status: AC | PRN
  Administered 2014-10-21: 25 ug via INTRAVENOUS
  Administered 2014-10-21: 50 ug via INTRAVENOUS

## 2014-10-21 MED ORDER — LIDOCAINE-EPINEPHRINE 2 %-1:100000 IJ SOLN
INTRAMUSCULAR | Status: AC
  Filled 2014-10-21: qty 1

## 2014-10-21 MED ORDER — SODIUM CHLORIDE 0.9 % IV SOLN
INTRAVENOUS | Status: DC
  Administered 2014-10-21: 08:00:00 via INTRAVENOUS

## 2014-10-21 MED ORDER — SODIUM CHLORIDE 0.9 % IJ SOLN
10.0000 mL | INTRAMUSCULAR | Status: DC | PRN
  Administered 2014-10-21: 10 mL
  Filled 2014-10-21: qty 10

## 2014-10-21 MED ORDER — MIDAZOLAM HCL 2 MG/2ML IJ SOLN
INTRAMUSCULAR | Status: AC | PRN
  Administered 2014-10-21: 1 mg via INTRAVENOUS
  Administered 2014-10-21 (×2): 0.5 mg via INTRAVENOUS

## 2014-10-21 NOTE — Progress Notes (Addendum)
Thoracic Location of Tumor / Histology: Lung Small cell carcinoma  (Large mass centered around the right hilar region and invading the mediastinum )  Patient presented  months ago with symptoms of: Shortness breath,fatigue,weakness  Weight loss 15-20 lbs  1-2 months,went to [facility], Dr. Lauretta Chester  Seen CT scan 09/26/14 done,  Biopsies of  (if applicable) revealed: Bronchoscopy  Bx done in [place], 8/216 = per note  From Dr,. Irene Limbo ,shows subcarinal lymph nodes report shpwed a small lung carcinoma, path report copy brought by family  Tobacco/Marijuana/Snuff/ETOH use: 1.5ppd x 60 years,quit 09/27/14 ,no alcohol or illicit drug use   Past/Anticipated interventions by cardiothoracic surgery, if any: none at present   Past/Anticipated interventions by medical oncology, if any:Dr. Irene Limbo 10/17/14, to start chemotherapy and referral nutrition therapy    Signs/Symptoms  Weight changes, if any:15-20 lbs loss but gaining some back since moved here to live with daughter and son   Respiratory complaints, if any: SOB, has inhaler, says COPD not dx,   Hemoptysis, if any: NO  Pain issues, if any:  Abdominal   Cramping, gas  SAFETY ISSUES: yes, unsteady   Prior radiation? NO  Pacemaker/ICD? NO  Possible current pregnancy?  N/A  Is the patient on methotrexate? NO  Current Complaints / other details:  Widowed,  1 son, Anxiety,(has acute anxiety attacks)  Mother lung cancer non smoker, sister colon cancer living,   Allergies: NKA  BP 122/68 mmHg  Pulse 73  Temp(Src) 98.6 F (37 C) (Oral)  Resp 20  Ht '5\' 3"'$  (1.6 m)  Wt 136 lb 6.4 oz (61.871 kg)  BMI 24.17 kg/m2  SpO2 100%  Wt Readings from Last 3 Encounters:  10/24/14 136 lb 6.4 oz (61.871 kg)  10/21/14 139 lb (63.05 kg)  10/20/14 139 lb (63.05 kg)

## 2014-10-21 NOTE — Patient Instructions (Signed)
[facility] Discharge Instructions for Patients Receiving Chemotherapy  Today you received the following chemotherapy agents Etoposide (VP 16) To help prevent nausea and vomiting after your treatment, we encourage you to take your nausea medication as prescribed.If you develop nausea and vomiting that is not controlled by your nausea medication, call the clinic.   BELOW ARE SYMPTOMS THAT SHOULD BE REPORTED IMMEDIATELY:  *FEVER GREATER THAN 100.5 F  *CHILLS WITH OR WITHOUT FEVER  NAUSEA AND VOMITING THAT IS NOT CONTROLLED WITH YOUR NAUSEA MEDICATION  *UNUSUAL SHORTNESS OF BREATH  *UNUSUAL BRUISING OR BLEEDING  TENDERNESS IN MOUTH AND THROAT WITH OR WITHOUT PRESENCE OF ULCERS  *URINARY PROBLEMS  *BOWEL PROBLEMS  UNUSUAL RASH Items with * indicate a potential emergency and should be followed up as soon as possible.  Feel free to call the clinic you have any questions or concerns. The clinic phone number is [phone].  Please show the CHEMO ALERT CARD at check-in to the Emergency Department and triage nurse. 

## 2014-10-21 NOTE — Progress Notes (Signed)
Dizziness resolved, pt stable, vss, afebrile.  portacath inserted today in IR,  Port was left accessed because pt has a 1230 appointment at the cancer center.  Pt going via wheelchair with family with her to cancer center with nurse tech taking her.

## 2014-10-21 NOTE — H&P (Signed)
Chief Complaint: Patient was seen in consultation today for  Port-A-Cath placement  Referring Physician(s): Kale,Gautam Kishore  History of Present Illness: Rachel Chapman is a 74 y.o. female with past medical history significant for hypertension, prior tobacco abuse, hypothyroidism, diabetes, hyperlipidemia, and newly diagnosed small cell carcinoma of the right lung. She presents today for Port-A-Cath placement for chemotherapy.  Past Medical History  Diagnosis Date  . Hypertension   . Dyslipidemia   . Diabetes   . Hypothyroidism   . Pneumonia 2007  . Smoker     1.5 packs per day for 60 years.  Quit one month ago  . Anxiety   . Cancer     Past Surgical History  Procedure Laterality Date  . Cesarean section      Allergies: Review of patient's allergies indicates no known allergies.  Medications: Prior to Admission medications   Medication Sig Start Date End Date Taking? Authorizing Provider  cholecalciferol (VITAMIN D) 1000 UNITS tablet Take 1,000 Units by mouth daily.   Yes Historical Provider, MD  citalopram (CELEXA) 10 MG tablet Take 1 tablet (10 mg total) by mouth daily. 10/13/14  Yes Gautam Juleen China, MD  levothyroxine (SYNTHROID, LEVOTHROID) 112 MCG tablet Take 112 mcg by mouth 2 (two) times daily.   Yes Historical Provider, MD  LORazepam (ATIVAN) 0.5 MG tablet Take 1 tablet (0.5 mg total) by mouth every 8 (eight) hours as needed for anxiety (or nausea). Could take 2 tab 30-45 mins prior to PET/CT and MRI Brain 10/13/14  Yes [name], MD  Multiple Vitamin (MULTIVITAMIN) tablet Take 1 tablet by mouth daily.   Yes Historical Provider, MD  nebivolol (BYSTOLIC) 5 MG tablet Take 5 mg by mouth 2 (two) times daily.   Yes Historical Provider, MD  simvastatin (ZOCOR) 20 MG tablet Take 20 mg by mouth at bedtime.    Yes Historical Provider, MD  sitaGLIPtin (JANUVIA) 100 MG tablet Take 100 mg by mouth daily.   Yes Historical Provider, MD  vitamin B-12  (CYANOCOBALAMIN) 1000 MCG tablet Take 1,000 mcg by mouth daily.   Yes Historical Provider, MD  albuterol (PROVENTIL HFA;VENTOLIN HFA) 108 (90 BASE) MCG/ACT inhaler Inhale 2 puffs into the lungs every 6 (six) hours as needed for shortness of breath.     Historical Provider, MD  lidocaine-prilocaine (EMLA) cream Apply topically once. 10/20/14   [name], MD  ondansetron (ZOFRAN) 4 MG tablet Take 1 tablet (4 mg total) by mouth every 8 (eight) hours as needed for nausea or vomiting. 10/13/14   [name], MD     Family History  Problem Relation Age of Onset  . Cancer Neg Hx   . Clotting disorder Neg Hx     Social History   Social History  . Marital Status: Widowed    Spouse Name: N/A  . Number of Children: N/A  . Years of Education: N/A   Social History Main Topics  . Smoking status: Former Smoker -- 1.50 packs/day for 60 years    Quit date: 09/12/2014  . Smokeless tobacco: None  . Alcohol Use: No  . Drug Use: No  . Sexual Activity: No   Other Topics Concern  . None   Social History Narrative      Review of Systems  Constitutional: Negative for fever and chills.  Respiratory: Negative for cough.        Some dyspnea with exertion  Cardiovascular: Negative for chest pain.  Gastrointestinal: Negative for nausea, vomiting, abdominal  pain and blood in stool.  Genitourinary: Negative for dysuria and hematuria.  Musculoskeletal: Negative for back pain.  Neurological: Negative for headaches.  Psychiatric/Behavioral: The patient is nervous/anxious.     Vital Signs: BP 174/67 mmHg  Pulse 72  Temp(Src) 97.9 F (36.6 C) (Oral)  Resp 16  Ht '5\' 2"'$  (1.575 m)  Wt 139 lb (63.05 kg)  BMI 25.42 kg/m2  SpO2 100%  Physical Exam  Constitutional: She is oriented to person, place, and time. She appears well-developed and well-nourished.  Cardiovascular: Normal rate and regular rhythm.   Pulmonary/Chest: Effort normal. She has wheezes.  Abdominal: Soft. Bowel sounds  are normal. There is no tenderness.  Musculoskeletal: Normal range of motion. She exhibits no edema.  Neurological: She is alert and oriented to person, place, and time.    Mallampati Score:     Imaging: Mr Jeri Cos XQ Contrast  10/19/2014   CLINICAL DATA:  New diagnosis of small cell lung cancer. Staging. Patient complains of confusion and nausea.  EXAM: MRI HEAD WITHOUT AND WITH CONTRAST  TECHNIQUE: Multiplanar, multiecho pulse sequences of the brain and surrounding structures were obtained without and with intravenous contrast.  CONTRAST:  25m MULTIHANCE GADOBENATE DIMEGLUMINE 529 MG/ML IV SOLN  COMPARISON:  None.  FINDINGS: No evidence for acute infarction, hemorrhage, mass lesion, hydrocephalus, or extra-axial fluid. T2 shine through affecting the LEFT parietal subcortical white matter corresponds to an area of ischemic demyelination on FLAIR sequence. Generalized atrophy. Fairly advanced T2 and FLAIR hyperintensities throughout periventricular and subcortical white matter representing chronic microvascular ischemic change.  Flow voids are maintained throughout the carotid, basilar, and vertebral arteries. There are no areas of chronic hemorrhage.  Pituitary, pineal, and cerebellar tonsils unremarkable. No upper cervical lesions.  Post infusion, no abnormal intracranial enhancement of concern for metastatic disease. There may be a small 4 mm nonenhancing extra-axial lesion along the LEFT occipital extra-axial compartment, suspected meningioma or osteoma (see image 4 series 10.  There is slight linear enhancement associated with a LEFT superior frontal gyrus and subcortical white matter. See image 36 series 11 and image 15 series 13. This is favored represent an incidental venous vascular anomaly, but short-term 6 weeks follow-up, using 3T imaging, is recommended for further evaluation.  No acute sinus or mastoid disease. Negative orbits. Scalp soft tissues unremarkable.  IMPRESSION: Slight linear  enhancement associated with the LEFT superior frontal gyrus favored to represent an incidental occult vascular malformation. Short-term 6 week follow-up recommended using 3 T scanner.  No acute intracranial findings. No enhancing intracranial mass lesion is definitely observed.   Electronically Signed   By: JStaci RighterM.D.   On: 10/19/2014 08:47   Nm Pet Image Initial (pi) Skull Base To Thigh  10/19/2014   CLINICAL DATA:  Initial treatment strategy for small cell lung cancer.  EXAM: NUCLEAR MEDICINE PET SKULL BASE TO THIGH  TECHNIQUE: 6.6 mCi F-18 FDG was injected intravenously. Full-ring PET imaging was performed from the skull base to thigh after the radiotracer. CT data was obtained and used for attenuation correction and anatomic localization.  FASTING BLOOD GLUCOSE:  Value: 190 mg/dl  COMPARISON:  None.  FINDINGS: NECK  No hypermetabolic lymph nodes in the neck.  CHEST  There is a large hypermetabolic mass centered around the right hilar region and invading the mediastinum. This measures 10.6 cm in maximum length and has an SUV max equal to 15.0. Tumor extends into the sub- carinal and right paratracheal region. Encasement and narrowing of the right mainstem  bronchus and right pulmonary artery noted. Hypermetabolic right paratracheal and sub- carinal adenopathy noted. Sub- carinal lymph node measures 2.5 cm and has an SUV max equal to 14.1. High right paratracheal lymph node measures 2.1 cm and has an SUV max equal to 15.8.Scarring identified within the left upper lobe. No pleural effusion.  ABDOMEN/PELVIS  No abnormal hypermetabolic activity within the liver, pancreas, adrenal glands, or spleen. Multiple stones identified within the gallbladder. Aortic atherosclerosis. No hypermetabolic lymph nodes in the abdomen or pelvis.  SKELETON  Degenerative disc disease is identified within the lumbar spine. There is an anterolisthesis of L5 on S1 bilateral L5 pars defects are present. There is heterogeneous marrow  uptake identified. No definite evidence for hypermetabolic bone metastases. No focal hypermetabolic activity to suggest skeletal metastasis.  IMPRESSION: 1. Intensely hypermetabolic right lung mass compatible with primary small cell lung cancer. There is evidence of involvement of the right mainstem bronchus as well as invasion of the mediastinum including the sub- carinal region. 2. Hypermetabolic sub- carinal and right paratracheal lymph node metastasis. 3. No evidence for distant metastatic disease. 4. Gallstones 5. Lumbar spondylosis 6. Aortic atherosclerosis.   Electronically Signed   By: Kerby Moors M.D.   On: 10/19/2014 10:54    Labs:  CBC:  Recent Labs  10/13/14 1123 10/20/14 1344 10/21/14 0755  WBC 13.3* 11.4* 11.2*  HGB 12.2 12.8 12.9  HCT 35.6 35.4 36.9  PLT 189 283 296    COAGS:  Recent Labs  10/21/14 0755  INR 0.91  APTT 26    BMP:  Recent Labs  10/13/14 1122 10/20/14 1344 10/21/14 0755  NA 136 130* 128*  K 5.6* 4.4 4.3  CL  --   --  94*  CO2 '28 24 26  '$ GLUCOSE 132 178* 174*  BUN 16.3 17.7 22*  CALCIUM 10.2 9.8 9.5  CREATININE 1.0 1.0 0.93  GFRNONAA  --   --  58*  GFRAA  --   --  >60    LIVER FUNCTION TESTS:  Recent Labs  10/13/14 1122 10/20/14 1344  BILITOT 0.45 0.67  AST 16 19  ALT 16 26  ALKPHOS 42 47  PROT 6.6 6.6  ALBUMIN 3.6 3.7    TUMOR MARKERS: No results for input(s): AFPTM, CEA, CA199, CHROMGRNA in the last 8760 hours.  Assessment and Plan: Patient with history of newly diagnosed small cell carcinoma of right lung. Plan is for Port-A-Cath placement today for chemotherapy.Risks and benefits discussed with the patient/family including, but not limited to bleeding, infection, pneumothorax, or fibrin sheath development and need for additional procedures.All of the patient's questions were answered, patient is agreeable to proceed.Consent signed and in chart.     Thank you for this interesting consult.  I greatly enjoyed meeting  Rachel Chapman and look forward to participating in their care.  A copy of this report was sent to the requesting provider on this date.  Signed: D. Rowe Robert 10/21/2014, 9:01 AM   I spent a total of 15 minutes  in face to face in clinical consultation, greater than 50% of which was counseling/coordinating care for Port-A-Cath placement

## 2014-10-21 NOTE — Telephone Encounter (Signed)
Gave and printed appt sched and avs for pt for Sept and OCT °

## 2014-10-21 NOTE — Procedures (Signed)
R IJ Port cathter placement with US and fluoroscopy No complication No blood loss. See complete dictation in [facility] PACS.  

## 2014-10-21 NOTE — Discharge Instructions (Signed)
Implanted Port Insertion, Care After °Refer to this sheet in the next few weeks. These instructions provide you with information on caring for yourself after your procedure. Your health care provider may also give you more specific instructions. Your treatment has been planned according to current medical practices, but problems sometimes occur. Call your health care provider if you have any problems or questions after your procedure. °WHAT TO EXPECT AFTER THE PROCEDURE °After your procedure, it is typical to have the following:  °· Discomfort at the port insertion site. Ice packs to the area will help. °· Bruising on the skin over the port. This will subside in 3-4 days. °HOME CARE INSTRUCTIONS °· After your port is placed, you will get a manufacturer's information card. The card has information about your port. Keep this card with you at all times.   °· Know what kind of port you have. There are many types of ports available.   °· Wear a medical alert bracelet in case of an emergency. This can help alert health care workers that you have a port.   °· The port can stay in for as long as your health care provider believes it is necessary.   °· A home health care nurse may give medicines and take care of the port.   °· You or a family member can get special training and directions for giving medicine and taking care of the port at home.   °SEEK MEDICAL CARE IF:  °· Your port does not flush or you are unable to get a blood return.   °· You have a fever or chills. °SEEK IMMEDIATE MEDICAL CARE IF: °· You have new fluid or pus coming from your incision.   °· You notice a bad smell coming from your incision site.   °· You have swelling, pain, or more redness at the incision or port site.   °· You have chest pain or shortness of breath. °Document Released: 11/18/2012 Document Revised: 02/02/2013 Document Reviewed: 11/18/2012 °[employer]® Patient Information ©2015 [employer]. This information is not intended to replace  advice given to you by your health care provider. Make sure you discuss any questions you have with your health care provider. °Implanted Port Home Guide °An implanted port is a type of central line that is placed under the skin. Central lines are used to provide IV access when treatment or nutrition needs to be given through a person's veins. Implanted ports are used for long-term IV access. An implanted port may be placed because:  °· You need IV medicine that would be irritating to the small veins in your hands or arms.   °· You need long-term IV medicines, such as antibiotics.   °· You need IV nutrition for a long period.   °· You need frequent blood draws for lab tests.   °· You need dialysis.   °Implanted ports are usually placed in the chest area, but they can also be placed in the upper arm, the abdomen, or the leg. An implanted port has two main parts:  °· Reservoir. The reservoir is round and will appear as a small, raised area under your skin. The reservoir is the part where a needle is inserted to give medicines or draw blood.   °· Catheter. The catheter is a thin, flexible tube that extends from the reservoir. The catheter is placed into a large vein. Medicine that is inserted into the reservoir goes into the catheter and then into the vein.   °HOW WILL I CARE FOR MY INCISION SITE? °Do not get the incision site wet. Bathe or   shower as directed by your health care provider.  °HOW IS MY PORT ACCESSED? °Special steps must be taken to access the port:  °· Before the port is accessed, a numbing cream can be placed on the skin. This helps numb the skin over the port site.   °· Your health care provider uses a sterile technique to access the port. °· Your health care provider must put on a mask and sterile gloves. °· The skin over your port is cleaned carefully with an antiseptic and allowed to dry. °· The port is gently pinched between sterile gloves, and a needle is inserted into the port. °· Only  "non-coring" port needles should be used to access the port. Once the port is accessed, a blood return should be checked. This helps ensure that the port is in the vein and is not clogged.   °· If your port needs to remain accessed for a constant infusion, a clear (transparent) bandage will be placed over the needle site. The bandage and needle will need to be changed every week, or as directed by your health care provider.   °· Keep the bandage covering the needle clean and dry. Do not get it wet. Follow your health care provider's instructions on how to take a shower or bath while the port is accessed.   °· If your port does not need to stay accessed, no bandage is needed over the port.   °WHAT IS FLUSHING? °Flushing helps keep the port from getting clogged. Follow your health care provider's instructions on how and when to flush the port. Ports are usually flushed with saline solution or a medicine called heparin. The need for flushing will depend on how the port is used.  °· If the port is used for intermittent medicines or blood draws, the port will need to be flushed:   °· After medicines have been given.   °· After blood has been drawn.   °· As part of routine maintenance.   °· If a constant infusion is running, the port may not need to be flushed.   °HOW LONG WILL MY PORT STAY IMPLANTED? °The port can stay in for as long as your health care provider thinks it is needed. When it is time for the port to come out, surgery will be done to remove it. The procedure is similar to the one performed when the port was put in.  °WHEN SHOULD I SEEK IMMEDIATE MEDICAL CARE? °When you have an implanted port, you should seek immediate medical care if:  °· You notice a bad smell coming from the incision site.   °· You have swelling, redness, or drainage at the incision site.   °· You have more swelling or pain at the port site or the surrounding area.   °· You have a fever that is not controlled with medicine. °Document  Released: 01/28/2005 Document Revised: 11/18/2012 Document Reviewed: 10/05/2012 °[employer]® Patient Information ©2015 [employer]. This information is not intended to replace advice given to you by your health care provider. Make sure you discuss any questions you have with your health care provider.Conscious Sedation, Adult, Care After °Refer to this sheet in the next few weeks. These instructions provide you with information on caring for yourself after your procedure. Your health care provider may also give you more specific instructions. Your treatment has been planned according to current medical practices, but problems sometimes occur. Call your health care provider if you have any problems or questions after your procedure. °WHAT TO EXPECT AFTER THE PROCEDURE  °After your procedure: °·   You may feel sleepy, clumsy, and have poor balance for several hours. °· Vomiting may occur if you eat too soon after the procedure. °HOME CARE INSTRUCTIONS °· Do not participate in any activities where you could become injured for at least 24 hours. Do not: °¨ Drive. °¨ Swim. °¨ Ride a bicycle. °¨ Operate heavy machinery. °¨ Cook. °¨ Use power tools. °¨ Climb ladders. °¨ Work from a high place. °· Do not make important decisions or sign legal documents until you are improved. °· If you vomit, drink water, juice, or soup when you can drink without vomiting. Make sure you have little or no nausea before eating solid foods. °· Only take over-the-counter or prescription medicines for pain, discomfort, or fever as directed by your health care provider. °· Make sure you and your family fully understand everything about the medicines given to you, including what side effects may occur. °· You should not drink alcohol, take sleeping pills, or take medicines that cause drowsiness for at least 24 hours. °· If you smoke, do not smoke without supervision. °· If you are feeling better, you may resume normal activities 24 hours after you  were sedated. °· Keep all appointments with your health care provider. °SEEK MEDICAL CARE IF: °· Your skin is pale or bluish in color. °· You continue to feel nauseous or vomit. °· Your pain is getting worse and is not helped by medicine. °· You have bleeding or swelling. °· You are still sleepy or feeling clumsy after 24 hours. °SEEK IMMEDIATE MEDICAL CARE IF: °· You develop a rash. °· You have difficulty breathing. °· You develop any type of allergic problem. °· You have a fever. °MAKE SURE YOU: °· Understand these instructions. °· Will watch your condition. °· Will get help right away if you are not doing well or get worse. °Document Released: 11/18/2012 Document Reviewed: 11/18/2012 °[employer]® Patient Information ©2015 [employer]. This information is not intended to replace advice given to you by your health care provider. Make sure you discuss any questions you have with your health care provider. ° °

## 2014-10-21 NOTE — Progress Notes (Signed)
Post Right IJ PAC insert. Dressing D&I and covered with gauze and tegaderm and upper bandaid over insertion site.PAC was left accessed ,capped and clamped x2 as pt has and appointment at the [facility] at 1230.Pt sat on edged of bed BP 102/66 then ambulated pt in room. Pt became dizzy and was placed back in bed where dizziness resolved. Pt is resting comfortably with HOB 45 degrees.

## 2014-10-24 ENCOUNTER — Ambulatory Visit: Payer: Medicare Other

## 2014-10-24 ENCOUNTER — Telehealth: Payer: Self-pay | Admitting: *Deleted

## 2014-10-24 ENCOUNTER — Encounter: Payer: Self-pay | Admitting: General Practice

## 2014-10-24 ENCOUNTER — Encounter: Payer: Self-pay | Admitting: Radiation Oncology

## 2014-10-24 ENCOUNTER — Ambulatory Visit
Admission: RE | Admit: 2014-10-24 | Discharge: 2014-10-24 | Disposition: A | Discharge status: Home / Self Care | Payer: Medicare Other | Source: Ambulatory Visit | Attending: Radiation Oncology | Admitting: Radiation Oncology

## 2014-10-24 ENCOUNTER — Ambulatory Visit (HOSPITAL_COMMUNITY)
Admission: RE | Admit: 2014-10-24 | Discharge: 2014-10-24 | Disposition: A | Discharge status: Home / Self Care | Payer: Medicare Other | Source: Ambulatory Visit | Attending: Radiation Oncology | Admitting: Radiation Oncology

## 2014-10-24 VITALS — BP 81/49 | HR 68 | Temp 98.6°F | Resp 20 | Ht 63.0 in | Wt 136.4 lb

## 2014-10-24 DIAGNOSIS — Z510 Encounter for antineoplastic radiation therapy: Secondary | ICD-10-CM | POA: Diagnosis present

## 2014-10-24 DIAGNOSIS — C3490 Malignant neoplasm of unspecified part of unspecified bronchus or lung: Secondary | ICD-10-CM

## 2014-10-24 DIAGNOSIS — C342 Malignant neoplasm of middle lobe, bronchus or lung: Secondary | ICD-10-CM | POA: Diagnosis not present

## 2014-10-24 DIAGNOSIS — F17210 Nicotine dependence, cigarettes, uncomplicated: Secondary | ICD-10-CM | POA: Insufficient documentation

## 2014-10-24 HISTORY — DX: Malignant neoplasm of unspecified part of unspecified bronchus or lung: C34.90

## 2014-10-24 MED ORDER — SODIUM CHLORIDE 0.9 % IV SOLN
INTRAVENOUS | Status: DC
  Administered 2014-10-24: 10:00:00 via INTRAVENOUS

## 2014-10-24 MED ORDER — HEPARIN SOD (PORK) LOCK FLUSH 100 UNIT/ML IV SOLN
500.0000 [IU] | INTRAVENOUS | Status: AC | PRN
  Administered 2014-10-24: 500 [IU]
  Filled 2014-10-24: qty 5

## 2014-10-24 MED ORDER — SODIUM CHLORIDE 0.9 % IJ SOLN
10.0000 mL | INTRAMUSCULAR | Status: AC | PRN
  Administered 2014-10-24: 10 mL

## 2014-10-24 NOTE — Progress Notes (Signed)
Rachel Chapman    HEMATOLOGY/ONCOLOGY CONSULTATION NOTE  Date of Service: 10/20/2014  CHIEF COMPLAINTS/PURPOSE OF CONSULTATION:  Newly diagnosed small cell carcinoma of the lung for further evaluation and management.  HISTORY OF PRESENTING ILLNESS: (plz see my initial clinic note for details on initial presentation)  Interval History  Patient is here for her schedule clinic followup. She had her PET/CT which showed bulky mediastinal disease but no evidence of overt spread beyond the chest making this limited stage small cell lung cancer. MRI brain with no overt evidence of brain mets. She started her first cycle of carboplatin/Etoposide 10/19/2014 and tolerated the first day well. Had scheduled appointment to have her port placed 10/21/2014. Accompanied by son and daughter. All questions answered in details. Notes that she is a little less anxious today.  MEDICAL HISTORY:  Past Medical History  Diagnosis Date  . Hypertension   . Dyslipidemia   . Diabetes   . Hypothyroidism   . Pneumonia 2007  . Smoker     1.5 packs per day for 60 years.  Quit one month ago  . Anxiety   . Cancer   . Lung cancer 8/216    hilar/mediastinum lung scca    SURGICAL HISTORY: Past Surgical History  Procedure Laterality Date  . Cesarean section      SOCIAL HISTORY: Social History   Social History  . Marital Status: Widowed    Spouse Name: N/A  . Number of Children: N/A  . Years of Education: N/A   Occupational History  . Not on file.   Social History Main Topics  . Smoking status: Former Smoker -- 1.50 packs/day for 60 years    Quit date: 09/12/2014  . Smokeless tobacco: Not on file  . Alcohol Use: No  . Drug Use: No  . Sexual Activity: No   Other Topics Concern  . 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 Clotting disorder Neg Hx   . Cancer Mother     lung non smoker  . Cancer  Sister     colon    ALLERGIES:  has No Known Allergies.  MEDICATIONS:  Current Outpatient Prescriptions  Medication Sig Dispense Refill  . albuterol (PROVENTIL HFA;VENTOLIN HFA) 108 (90 BASE) MCG/ACT inhaler Inhale 2 puffs into the lungs every 6 (six) hours as needed for shortness of breath.     . cholecalciferol (VITAMIN D) 1000 UNITS tablet Take 1,000 Units by mouth daily.    . citalopram (CELEXA) 10 MG tablet Take 1 tablet (10 mg total) by mouth daily. 30 tablet 1  . levothyroxine (SYNTHROID, LEVOTHROID) 112 MCG tablet Take 112 mcg by mouth 2 (two) times daily.    Rachel Chapman LORazepam (ATIVAN) 0.5 MG tablet Take 1 tablet (0.5 mg total) by mouth every 8 (eight) hours as needed for anxiety (or nausea). Could take 2 tab 30-45 mins prior to PET/CT and MRI Brain 60 tablet 0  . Multiple Vitamin (MULTIVITAMIN) tablet Take 1 tablet by mouth daily.    . nebivolol (BYSTOLIC) 5 MG tablet Take 5 mg by mouth 2 (two) times daily.    . ondansetron (ZOFRAN) 4 MG tablet Take 1 tablet (4 mg total) by mouth every 8 (eight) hours as needed for nausea or vomiting. 30 tablet 0  . simvastatin (ZOCOR) 20 MG tablet Take 20 mg by mouth at bedtime.     . sitaGLIPtin (JANUVIA) 100 MG  tablet Take 100 mg by mouth daily.    . vitamin B-12 (CYANOCOBALAMIN) 1000 MCG tablet Take 1,000 mcg by mouth daily.    Rachel Chapman lidocaine-prilocaine (EMLA) cream Apply topically once. 30 each 2   No current facility-administered medications for this visit.   Facility-Administered Medications Ordered in Other Visits  Medication Dose Route Frequency Provider Last Rate Last Dose  . 0.9 %  sodium chloride infusion   Intravenous Continuous [name], MD   Stopped at 10/24/14 1157  . fludeoxyglucose F - 18 (FDG) injection 6.6 milli Curie  6.6 milli Curie Intravenous Once PRN Medication Radiologist, MD   6.6 milli Curie at 10/19/14 1005     REVIEW OF SYSTEMS:    10 Point review of Systems was done is negative except as noted above.  PHYSICAL  EXAMINATION: ECOG PERFORMANCE STATUS: 1 - Symptomatic but completely ambulatory  . Filed Vitals:   10/20/14 1413  Height: '5\' 2"'$  (1.575 m)  Weight: 139 lb (63.05 kg)   Filed Weights   10/20/14 1413  Weight: 139 lb (63.05 kg)   .Body mass index is 25.42 kg/(m^2).  GENERAL:alert, in no acute distress but anxious SKIN: skin color, texture, turgor are normal, no rashes or significant lesions EYES: normal, conjunctiva are pink and non-injected, sclera clear OROPHARYNX:no exudate, no erythema and lips, buccal mucosa, and tongue normal  NECK: supple, no JVD, thyroid normal size, non-tender, without nodularity LYMPH:  no palpable lymphadenopathy in the cervical, axillary or inguinal LUNGS: clear to auscultation with normal respiratory effort HEART: regular rate & rhythm,  no murmurs and no lower extremity edema ABDOMEN: abdomen soft, non-tender, normoactive bowel sounds  Musculoskeletal: no cyanosis of digits and no clubbing  PSYCH: alert & oriented x 3 with fluent speech NEURO: no focal motor/sensory deficits  LABORATORY DATA:  I have reviewed the data as listed  . CBC Latest Ref Rng 10/21/2014 10/20/2014 10/13/2014  WBC 4.0 - 10.5 K/uL 11.2(H) 11.4(H) 13.3(H)  Hemoglobin 12.0 - 15.0 g/dL 12.9 12.8 12.2  Hematocrit 36.0 - 46.0 % 36.9 35.4 35.6  Platelets 150 - 400 K/uL 296 283 189    . CMP Latest Ref Rng 10/21/2014 10/20/2014 10/13/2014  Glucose 65 - 99 mg/dL 174(H) 178(H) 132  BUN 6 - 20 mg/dL 22(H) 17.7 16.3  Creatinine 0.44 - 1.00 mg/dL 0.93 1.0 1.0  Sodium 135 - 145 mmol/L 128(L) 130(L) 136  Potassium 3.5 - 5.1 mmol/L 4.3 4.4 5.6(H)  Chloride 101 - 111 mmol/L 94(L) - -  CO2 22 - 32 mmol/L '26 24 28  '$ Calcium 8.9 - 10.3 mg/dL 9.5 9.8 10.2  Total Protein 6.4 - 8.3 g/dL - 6.6 6.6  Total Bilirubin 0.20 - 1.20 mg/dL - 0.67 0.45  Alkaline Phos 40 - 150 U/L - 47 42  AST 5 - 34 U/L - 19 16  ALT 0 - 55 U/L - 26 16     RADIOGRAPHIC STUDIES:  CT chest without contrast [09/26/2014]-done  at [facility].  There is left apical scarring with fibrous retraction that is grossly unchanged from the prior examination. There is a tree in bud opacification within the anterior aspect of the right upper lung field measuring 8 mm x 6 mm. There is a large paratracheal lymph node mass that measures 2.3 cm x 2.8 cm with extension into the right hilum.  There is a 2.9 cm subcarinal lymphadenopathy. Gallstones noted within the gallbladder. Impression Mediastinal lymphadenopathy indicative of neoplasm.  9 mm right apical nodule.  Apical pleural based  thickening, left-sided, chronic in nature.  [facility] pathology report specimen #[id number] Final diagnosis A] subcarinal lymph node #1 ultrasound-guided cell block- Small cell carcinoma B] subcarinal lymph node #2 ultrasound-guided cell block- Small cell carcinoma  Comment: Immunohistochemistry studies were performed and results of both the diagnosis positive for CD 56, pancytokeratin and synaptophysin.  Negative for chromogranin, CD3 and CD 20.  .Mr Jeri Cos Wo Contrast  10/19/2014   CLINICAL DATA:  New diagnosis of small cell lung cancer. Staging. Patient complains of confusion and nausea.  EXAM: MRI HEAD WITHOUT AND WITH CONTRAST  TECHNIQUE: Multiplanar, multiecho pulse sequences of the brain and surrounding structures were obtained without and with intravenous contrast.  CONTRAST:  21m MULTIHANCE GADOBENATE DIMEGLUMINE 529 MG/ML IV SOLN  COMPARISON:  None.  FINDINGS: No evidence for acute infarction, hemorrhage, mass lesion, hydrocephalus, or extra-axial fluid. T2 shine through affecting the LEFT parietal subcortical white matter corresponds to an area of ischemic demyelination on FLAIR sequence. Generalized atrophy. Fairly advanced T2 and FLAIR hyperintensities throughout periventricular and subcortical white matter representing chronic microvascular ischemic change.  Flow voids are maintained throughout  the carotid, basilar, and vertebral arteries. There are no areas of chronic hemorrhage.  Pituitary, pineal, and cerebellar tonsils unremarkable. No upper cervical lesions.  Post infusion, no abnormal intracranial enhancement of concern for metastatic disease. There may be a small 4 mm nonenhancing extra-axial lesion along the LEFT occipital extra-axial compartment, suspected meningioma or osteoma (see image 4 series 10.  There is slight linear enhancement associated with a LEFT superior frontal gyrus and subcortical white matter. See image 36 series 11 and image 15 series 13. This is favored represent an incidental venous vascular anomaly, but short-term 6 weeks follow-up, using 3T imaging, is recommended for further evaluation.  No acute sinus or mastoid disease. Negative orbits. Scalp soft tissues unremarkable.  IMPRESSION: Slight linear enhancement associated with the LEFT superior frontal gyrus favored to represent an incidental occult vascular malformation. Short-term 6 week follow-up recommended using 3 T scanner.  No acute intracranial findings. No enhancing intracranial mass lesion is definitely observed.   Electronically Signed   By: JStaci RighterM.D.   On: 10/19/2014 08:47   Nm Pet Image Initial (pi) Skull Base To Thigh  10/19/2014   CLINICAL DATA:  Initial treatment strategy for small cell lung cancer.  EXAM: NUCLEAR MEDICINE PET SKULL BASE TO THIGH  TECHNIQUE: 6.6 mCi F-18 FDG was injected intravenously. Full-ring PET imaging was performed from the skull base to thigh after the radiotracer. CT data was obtained and used for attenuation correction and anatomic localization.  FASTING BLOOD GLUCOSE:  Value: 190 mg/dl  COMPARISON:  None.  FINDINGS: NECK  No hypermetabolic lymph nodes in the neck.  CHEST  There is a large hypermetabolic mass centered around the right hilar region and invading the mediastinum. This measures 10.6 cm in maximum length and has an SUV max equal to 15.0. Tumor extends into the  sub- carinal and right paratracheal region. Encasement and narrowing of the right mainstem bronchus and right pulmonary artery noted. Hypermetabolic right paratracheal and sub- carinal adenopathy noted. Sub- carinal lymph node measures 2.5 cm and has an SUV max equal to 14.1. High right paratracheal lymph node measures 2.1 cm and has an SUV max equal to 15.8.Scarring identified within the left upper lobe. No pleural effusion.  ABDOMEN/PELVIS  No abnormal hypermetabolic activity within the liver, pancreas, adrenal glands, or spleen. Multiple stones identified within the gallbladder. Aortic atherosclerosis. No hypermetabolic lymph  nodes in the abdomen or pelvis.  SKELETON  Degenerative disc disease is identified within the lumbar spine. There is an anterolisthesis of L5 on S1 bilateral L5 pars defects are present. There is heterogeneous marrow uptake identified. No definite evidence for hypermetabolic bone metastases. No focal hypermetabolic activity to suggest skeletal metastasis.  IMPRESSION: 1. Intensely hypermetabolic right lung mass compatible with primary small cell lung cancer. There is evidence of involvement of the right mainstem bronchus as well as invasion of the mediastinum including the sub- carinal region. 2. Hypermetabolic sub- carinal and right paratracheal lymph node metastasis. 3. No evidence for distant metastatic disease. 4. Gallstones 5. Lumbar spondylosis 6. Aortic atherosclerosis.   Electronically Signed   By: Kerby Moors M.D.   On: 10/19/2014 10:54   Ir Fluoro Guide Cv Line Right  10/21/2014   CLINICAL DATA:  Primary right lung carcinoma, needs access for chemotherapy  EXAM: TUNNELED PORT CATHETER PLACEMENT WITH ULTRASOUND AND FLUOROSCOPIC GUIDANCE  FLUOROSCOPY TIME:  30 seconds, 5 mGy  ANESTHESIA/SEDATION: Intravenous Fentanyl and Versed were administered as conscious sedation during continuous cardiorespiratory monitoring by the radiology RN, with a total moderate sedation time of 17  minutes.  TECHNIQUE: The procedure, risks, benefits, and alternatives were explained to the patient. Questions regarding the procedure were encouraged and answered. The patient understands and consents to the procedure. As antibiotic prophylaxis, cefazolin 2 g was ordered pre-procedure and administered intravenously within one hour of incision. Patency of the right IJ vein was confirmed with ultrasound with image documentation. An appropriate skin site was determined. Skin site was marked. Region was prepped using maximum barrier technique including cap and mask, sterile gown, sterile gloves, large sterile sheet, and Chlorhexidine as cutaneous antisepsis. The region was infiltrated locally with 1% lidocaine. Under real-time ultrasound guidance, the right IJ vein was accessed with a 21 gauge micropuncture needle; the needle tip within the vein was confirmed with ultrasound image documentation. Needle was exchanged over a 018 guidewire for transitional dilator which allowed passage of the [facility] wire into the IVC. Over this, the transitional dilator was exchanged for a 5 [place] MPA catheter. A small incision was made on the right anterior chest wall and a subcutaneous pocket fashioned. The power-injectable port was positioned and its catheter tunneled to the right IJ dermatotomy site. The MPA catheter was exchanged over an Amplatz wire for a peel-away sheath, through which the port catheter, which had been trimmed to the appropriate length, was advanced and positioned under fluoroscopy with its tip at the cavoatrial junction. Spot chest radiograph confirms good catheter position and no pneumothorax. The pocket was closed with deep interrupted and subcuticular continuous 3-0 Monocryl sutures. The port was flushed per protocol. The incisions were covered with Dermabond then covered with a sterile dressing.  COMPLICATIONS: COMPLICATIONS None immediate  IMPRESSION: Technically successful right IJ power-injectable port  catheter placement. Ready for routine use.   Electronically Signed   By: Lucrezia Europe M.D.   On: 10/21/2014 11:14   Ir US Guide Vasc Access Right  10/21/2014   CLINICAL DATA:  Primary right lung carcinoma, needs access for chemotherapy  EXAM: TUNNELED PORT CATHETER PLACEMENT WITH ULTRASOUND AND FLUOROSCOPIC GUIDANCE  FLUOROSCOPY TIME:  30 seconds, 5 mGy  ANESTHESIA/SEDATION: Intravenous Fentanyl and Versed were administered as conscious sedation during continuous cardiorespiratory monitoring by the radiology RN, with a total moderate sedation time of 17 minutes.  TECHNIQUE: The procedure, risks, benefits, and alternatives were explained to the patient. Questions regarding the procedure were encouraged and  answered. The patient understands and consents to the procedure. As antibiotic prophylaxis, cefazolin 2 g was ordered pre-procedure and administered intravenously within one hour of incision. Patency of the right IJ vein was confirmed with ultrasound with image documentation. An appropriate skin site was determined. Skin site was marked. Region was prepped using maximum barrier technique including cap and mask, sterile gown, sterile gloves, large sterile sheet, and Chlorhexidine as cutaneous antisepsis. The region was infiltrated locally with 1% lidocaine. Under real-time ultrasound guidance, the right IJ vein was accessed with a 21 gauge micropuncture needle; the needle tip within the vein was confirmed with ultrasound image documentation. Needle was exchanged over a 018 guidewire for transitional dilator which allowed passage of the [facility] wire into the IVC. Over this, the transitional dilator was exchanged for a 5 [place] MPA catheter. A small incision was made on the right anterior chest wall and a subcutaneous pocket fashioned. The power-injectable port was positioned and its catheter tunneled to the right IJ dermatotomy site. The MPA catheter was exchanged over an Amplatz wire for a peel-away sheath, through  which the port catheter, which had been trimmed to the appropriate length, was advanced and positioned under fluoroscopy with its tip at the cavoatrial junction. Spot chest radiograph confirms good catheter position and no pneumothorax. The pocket was closed with deep interrupted and subcuticular continuous 3-0 Monocryl sutures. The port was flushed per protocol. The incisions were covered with Dermabond then covered with a sterile dressing.  COMPLICATIONS: COMPLICATIONS None immediate  IMPRESSION: Technically successful right IJ power-injectable port catheter placement. Ready for routine use.   Electronically Signed   By: Lucrezia Europe M.D.   On: 10/21/2014 11:14   ASSESSMENT & PLAN:   74 year old female with history of heavy smoking with  #1 Newly diagnosed Limited stage Small cell lung cancer -unknown staging at this time.  CT scan on 09/26/2014 at the outside hospital showed mediastinal adenopathy which was bronchoscopically biopsied and noted to be consistent with small cell lung cancer. No overt focal neurological deficits or symptoms suggestive of brain metastases at this time. PET/CT showed bulky hypermetabolic mediastinal disease consistent with limited stage small cell lung cancer. MRI brain with no overt CNS mets Plan -patient started with cycle of carboplatin + Etoposide on 10/19/2014. -she will be getting a port on 10/21/2014 -scheduled to see radiation oncology on 10/24/2014 to consider concurrent chemo-RT either from cycle 1 of chemotherapy or cycle 2 -PET/CT and MRI Brain were discussed in details with the patient, her son and daughter in clinic. -f/u with nutritional therapy  #2 protein calorie malnutrition and weight loss due to small cell lung cancer. -f/u with nutritional therapy.  #3 ex-smoker 90-pack-year history of smoking at one month ago.  #4 likely COPD has never been diagnosed as such.  He has been recently given an albuterol inhaler as needed.  Not oxygen dependent.  #5  hyperkalemia potassium 5.6. - resolved on rpt labs  #6 hypothyroidism -continuity with levothyroxine replacement  #7Diabetes patient has been controlled with oral hypoglycemics.  She has lost about 15-20 pounds. Plan Continue Januvia. -off her metformin to reduce its anorexic effects. -Patient will monitor blood sugars at home  #8 severe anxiety.  Patient is having insomnia and difficulty with relaxing and is requesting medications for this. Plan -continue on Celexa for long-term management of generalized anxiety. -continue lorazepam when necessary for acute anxiety attacks  RTC in 1 week for chemotherapy toxicity check with labs.  I spent 20 minutes counseling the  patient face to face. The total time spent in the appointment was 30 minutes and more than 50% was on counseling and direct patient cares.    Sullivan Lone MD [place] AAHIVMS [facility] Hematology/Oncology Physician [facility]  (Office):       [phone] (Work cell):  [phone] (Fax):           [phone] 10/20/2014

## 2014-10-24 NOTE — Progress Notes (Signed)
Please see the Nurse Progress Note in the MD Initial Consult Encounter for this patient. 

## 2014-10-24 NOTE — Progress Notes (Signed)
Spiritual Care Note  Followed up with Rachel Chapman, who goes by "Rachel Chapman," and her son Rachel Chapman and daughter-in-law Rachel Chapman at [facility], where Rachel Chapman was receiving fluids.  Received original referral from [place], [place] via Polo Riley, LCSW due to high score on distress screen, as well as poor appetite/hydration and weight loss.    Provided further spiritual and emotional support to pt and family.  Son and DIL are feeling caregiver distress and fatigue.  Rachel Chapman reports no appetite or interest in food, naming that she is indeed "fighting" (that is, approaching tx seriously as a route to longer, better life) but that nothing appeals to her.  Encouraged her to see RD for support and suggestions for maximizing calories/nutrition; pt indicates that appointment is already scheduled.  Explored motivations and coping strategies.  Assisted pt and family in identifying and verbalizing common goals, using repeatable communication tools.  Provided encouragement and affirmation as Rachel Chapman walked from clinic to [facility] hospital entrance.  Pt and family left with a lunch plan and specific eating goals in mind.  Pt and family verbalized appreciation and are aware of ongoing chaplain availability.  Please also page as needs arise.  Thank you.  [place], [place] Pager [phone] Voicemail  [phone]

## 2014-10-24 NOTE — Telephone Encounter (Signed)
Called sickle  Cell they can give IVF"S NS now, per Charlene,thanked her  Escorted patient via 2w/c to sickle cell  9:24 AM

## 2014-10-24 NOTE — Progress Notes (Signed)
Patient ID: Rachel Chapman, female   DOB: 1940/11/08, 74 y.o.   MRN: [id number] Patient of dr. Isidore Moos, with diagnosis of Primary cancer of right middle lobe of lung (162.4) Arrived to the [facility] for infusion of NS. Port was accessed, good blood return noted. Port is fresh and glue is present on skin; scar is intact. Pt tolerated procedure well. Pt is ambulatory post-procedure. Discharged to home.  Lucien Mons, [place]

## 2014-10-24 NOTE — Progress Notes (Signed)
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 female who presented shortness breath, fatigue, weakness and weight loss of 15-20 lbs  Bronchoscopy / Biopsy in [place] state revealed small cell carcinoma.   She has quit smoking since diagnosis was established last month. She moved here to be with family during treatments.    PET negative for metastatic disease.  There is bilateral mediastinal adenopathy including left prevascular node that is hypermetabolic, and mediastinal invasion by primary tumor. Brain MRI negative for obvious metastases.  Dr. Irene Limbo, last week, started chemotherapy.   Respiratory complaints, if any: SOB, has inhaler, says COPD not dx,   Hemoptysis, if any: NO  Pain issues, if any:  Abdominal Cramping, gas, constipation with diarrhea.  Other: dizzy, cannot stand without help, poor po intake.  Anxiety,(has acute anxiety attacks)     PREVIOUS RADIATION THERAPY: No  PAST MEDICAL HISTORY:  has a past medical history of Hypertension; Dyslipidemia; Diabetes; Hypothyroidism; Pneumonia (2007); Smoker; Anxiety; Cancer; and Lung cancer (8/216).    PAST SURGICAL HISTORY: Past Surgical History  Procedure Laterality Date  . Cesarean section      FAMILY HISTORY: family history includes Cancer in her mother and sister. There is no history of Clotting disorder.  SOCIAL HISTORY:  reports that she quit smoking about 6 weeks ago. She does not have any smokeless tobacco history on file. She reports that she does not drink alcohol or use illicit  drugs.  ALLERGIES: Review of patient's allergies indicates no known allergies.  MEDICATIONS:  Current Outpatient Prescriptions  Medication Sig Dispense Refill  . cholecalciferol (VITAMIN D) 1000 UNITS tablet Take 1,000 Units by mouth daily.    . citalopram (CELEXA) 10 MG tablet Take 1 tablet (10 mg total) by mouth daily. 30 tablet 1  . levothyroxine (SYNTHROID, LEVOTHROID) 112 MCG tablet Take 112 mcg by mouth 2 (two) times daily.    Marland Kitchen lidocaine-prilocaine (EMLA) cream Apply topically once. 30 each 2  . LORazepam (ATIVAN) 0.5 MG tablet Take 1 tablet (0.5 mg total) by mouth every 8 (eight) hours as needed for anxiety (or nausea). Could take 2 tab 30-45 mins prior to PET/CT and MRI Brain 60 tablet 0  . nebivolol (BYSTOLIC) 5 MG tablet Take 5 mg by mouth 2 (two) times daily.    . ondansetron (ZOFRAN) 4 MG tablet Take 1 tablet (4 mg total) by mouth every 8 (eight) hours as needed for nausea or vomiting. 30 tablet 0  . simvastatin (ZOCOR) 20 MG tablet Take 20 mg by mouth at bedtime.     . vitamin B-12 (CYANOCOBALAMIN) 1000 MCG tablet Take 1,000 mcg by mouth daily.    Marland Kitchen albuterol (PROVENTIL HFA;VENTOLIN HFA) 108 (90 BASE) MCG/ACT inhaler Inhale 2 puffs into the lungs every 6 (six) hours as needed for shortness of breath.     . Multiple Vitamin (MULTIVITAMIN) tablet Take 1 tablet by mouth daily.    . sitaGLIPtin (JANUVIA) 100 MG tablet Take 100 mg by mouth daily.     No current facility-administered medications for this encounter.  Facility-Administered Medications Ordered in Other Encounters  Medication Dose Route Frequency Provider Last Rate Last Dose  . 0.9 %  sodium chloride infusion   Intravenous Continuous [name], MD   Stopped at 10/24/14 1157  . fludeoxyglucose F - 18 (FDG) injection 6.6 milli Curie  6.6 milli Curie Intravenous Once PRN Medication Radiologist, MD   6.6 milli Curie at 10/19/14 1005    REVIEW OF SYSTEMS:  Notable for that above.   PHYSICAL EXAM:  height is '5\' 3"'$   (1.6 m) and weight is 136 lb 6.4 oz (61.871 kg). Her oral temperature is 98.6 F (37 C). Her blood pressure is 81/49 and her pulse is 68. Her respiration is 20 and oxygen saturation is 100%.   General: Alert and oriented, in no acute distress HEENT: Head is normocephalic. Extraocular movements are intact. Oropharynx is clear. Neck: Neck is supple, no palpable cervical or supraclavicular lymphadenopathy. Heart: Regular in rate and rhythm with no murmurs, rubs, or gallops. Chest: Clear to auscultation bilaterally, with no rhonchi, wheezes, or rales. Abdomen: Soft, nontender, nondistended, with no rigidity or guarding. Extremities: No cyanosis or edema. Lymphatics: see Neck Exam Skin: No concerning lesions. Musculoskeletal: symmetric strength and muscle tone throughout. Neurologic: Cranial nerves II through XII are grossly intact. No obvious focalities. Speech is fluent.  Cannot stand without help - unsteady Psychiatric: Judgment and insight are intact. Affect blunted  ECOG = 3  0 - Asymptomatic (Fully active, able to carry on all predisease activities without restriction)  1 - Symptomatic but completely ambulatory (Restricted in physically strenuous activity but ambulatory and able to carry out work of a light or sedentary nature. For example, light housework, office work)  2 - Symptomatic, <50% in bed during the day (Ambulatory and capable of all self care but unable to carry out any work activities. Up and about more than 50% of waking hours)  3 - Symptomatic, >50% in bed, but not bedbound (Capable of only limited self-care, confined to bed or chair 50% or more of waking hours)  4 - Bedbound (Completely disabled. Cannot carry on any self-care. Totally confined to bed or chair)  5 - Death   Eustace Pen MM, Creech RH, Tormey DC, et al. [phone]). "Toxicity and response criteria of the [facility]". [place] Oncol. 5 (6): 649-55   LABORATORY DATA:  Lab Results   Component Value Date   WBC 11.2* 10/21/2014   HGB 12.9 10/21/2014   HCT 36.9 10/21/2014   MCV 87.0 10/21/2014   PLT 296 10/21/2014   CMP     Component Value Date/Time   NA 128* 10/21/2014 0755   NA 130* 10/20/2014 1344   K 4.3 10/21/2014 0755   K 4.4 10/20/2014 1344   CL 94* 10/21/2014 0755   CO2 26 10/21/2014 0755   CO2 24 10/20/2014 1344   GLUCOSE 174* 10/21/2014 0755   GLUCOSE 178* 10/20/2014 1344   BUN 22* 10/21/2014 0755   BUN 17.7 10/20/2014 1344   CREATININE 0.93 10/21/2014 0755   CREATININE 1.0 10/20/2014 1344   CALCIUM 9.5 10/21/2014 0755   CALCIUM 9.8 10/20/2014 1344   PROT 6.6 10/20/2014 1344   ALBUMIN 3.7 10/20/2014 1344   AST 19 10/20/2014 1344   ALT 26 10/20/2014 1344   ALKPHOS 47 10/20/2014 1344   BILITOT 0.67 10/20/2014 1344   GFRNONAA 58* 10/21/2014 0755   GFRAA >60 10/21/2014 0755         RADIOGRAPHY: Mr Jeri Cos Wo Contrast  10/19/2014  CLINICAL DATA:  New diagnosis of small cell lung cancer. Staging. Patient complains of confusion and nausea.  EXAM: MRI HEAD WITHOUT AND WITH CONTRAST  TECHNIQUE: Multiplanar, multiecho pulse sequences of the brain and surrounding structures were obtained without and with intravenous contrast.  CONTRAST:  33m MULTIHANCE GADOBENATE DIMEGLUMINE 529 MG/ML IV SOLN  COMPARISON:  None.  FINDINGS: No evidence for acute infarction, hemorrhage, mass lesion, hydrocephalus, or extra-axial fluid. T2 shine through affecting the LEFT parietal subcortical white matter corresponds to an area of ischemic demyelination on FLAIR sequence. Generalized atrophy. Fairly advanced T2 and FLAIR hyperintensities throughout periventricular and subcortical white matter representing chronic microvascular ischemic change.  Flow voids are maintained throughout the carotid, basilar, and vertebral arteries. There are no areas of chronic hemorrhage.  Pituitary, pineal, and cerebellar tonsils unremarkable. No upper cervical lesions.  Post infusion, no abnormal  intracranial enhancement of concern for metastatic disease. There may be a small 4 mm nonenhancing extra-axial lesion along the LEFT occipital extra-axial compartment, suspected meningioma or osteoma (see image 4 series 10.  There is slight linear enhancement associated with a LEFT superior frontal gyrus and subcortical white matter. See image 36 series 11 and image 15 series 13. This is favored represent an incidental venous vascular anomaly, but short-term 6 weeks follow-up, using 3T imaging, is recommended for further evaluation.  No acute sinus or mastoid disease. Negative orbits. Scalp soft tissues unremarkable.  IMPRESSION: Slight linear enhancement associated with the LEFT superior frontal gyrus favored to represent an incidental occult vascular malformation. Short-term 6 week follow-up recommended using 3 T scanner.  No acute intracranial findings. No enhancing intracranial mass lesion is definitely observed.   Electronically Signed   By: JStaci RighterM.D.   On: 10/19/2014 08:47   Nm Pet Image Initial (pi) Skull Base To Thigh  10/19/2014   CLINICAL DATA:  Initial treatment strategy for small cell lung cancer.  EXAM: NUCLEAR MEDICINE PET SKULL BASE TO THIGH  TECHNIQUE: 6.6 mCi F-18 FDG was injected intravenously. Full-ring PET imaging was performed from the skull base to thigh after the radiotracer. CT data was obtained and used for attenuation correction and anatomic localization.  FASTING BLOOD GLUCOSE:  Value: 190 mg/dl  COMPARISON:  None.  FINDINGS: NECK  No hypermetabolic lymph nodes in the neck.  CHEST  There is a large hypermetabolic mass centered around the right hilar region and invading the mediastinum. This measures 10.6 cm in maximum length and has an SUV max equal to 15.0. Tumor extends into the sub- carinal and right paratracheal region. Encasement and narrowing of the right mainstem bronchus and right pulmonary artery noted. Hypermetabolic right paratracheal and sub- carinal adenopathy  noted. Sub- carinal lymph node measures 2.5 cm and has an SUV max equal to 14.1. High right paratracheal lymph node measures 2.1 cm and has an SUV max equal to 15.8.Scarring identified within the left upper lobe. No pleural effusion.  ABDOMEN/PELVIS  No abnormal hypermetabolic activity within the liver, pancreas, adrenal glands, or spleen. Multiple stones identified within the gallbladder. Aortic atherosclerosis. No hypermetabolic lymph nodes in the abdomen or pelvis.  SKELETON  Degenerative disc disease is identified within the lumbar spine. There is an anterolisthesis of L5 on S1 bilateral L5 pars defects are present. There is heterogeneous marrow uptake identified. No definite evidence for hypermetabolic bone metastases. No focal hypermetabolic activity to suggest skeletal metastasis.  IMPRESSION: 1. Intensely hypermetabolic right lung mass compatible with primary small cell lung cancer. There is evidence of involvement of the  right mainstem bronchus as well as invasion of the mediastinum including the sub- carinal region. 2. Hypermetabolic sub- carinal and right paratracheal lymph node metastasis. 3. No evidence for distant metastatic disease. 4. Gallstones 5. Lumbar spondylosis 6. Aortic atherosclerosis.   Electronically Signed   By: Kerby Moors M.D.   On: 10/19/2014 10:54   Ir Fluoro Guide Cv Line Right  10/21/2014   CLINICAL DATA:  Primary right lung carcinoma, needs access for chemotherapy  EXAM: TUNNELED PORT CATHETER PLACEMENT WITH ULTRASOUND AND FLUOROSCOPIC GUIDANCE  FLUOROSCOPY TIME:  30 seconds, 5 mGy  ANESTHESIA/SEDATION: Intravenous Fentanyl and Versed were administered as conscious sedation during continuous cardiorespiratory monitoring by the radiology RN, with a total moderate sedation time of 17 minutes.  TECHNIQUE: The procedure, risks, benefits, and alternatives were explained to the patient. Questions regarding the procedure were encouraged and answered. The patient understands and  consents to the procedure. As antibiotic prophylaxis, cefazolin 2 g was ordered pre-procedure and administered intravenously within one hour of incision. Patency of the right IJ vein was confirmed with ultrasound with image documentation. An appropriate skin site was determined. Skin site was marked. Region was prepped using maximum barrier technique including cap and mask, sterile gown, sterile gloves, large sterile sheet, and Chlorhexidine as cutaneous antisepsis. The region was infiltrated locally with 1% lidocaine. Under real-time ultrasound guidance, the right IJ vein was accessed with a 21 gauge micropuncture needle; the needle tip within the vein was confirmed with ultrasound image documentation. Needle was exchanged over a 018 guidewire for transitional dilator which allowed passage of the [facility] wire into the IVC. Over this, the transitional dilator was exchanged for a 5 [place] MPA catheter. A small incision was made on the right anterior chest wall and a subcutaneous pocket fashioned. The power-injectable port was positioned and its catheter tunneled to the right IJ dermatotomy site. The MPA catheter was exchanged over an Amplatz wire for a peel-away sheath, through which the port catheter, which had been trimmed to the appropriate length, was advanced and positioned under fluoroscopy with its tip at the cavoatrial junction. Spot chest radiograph confirms good catheter position and no pneumothorax. The pocket was closed with deep interrupted and subcuticular continuous 3-0 Monocryl sutures. The port was flushed per protocol. The incisions were covered with Dermabond then covered with a sterile dressing.  COMPLICATIONS: COMPLICATIONS None immediate  IMPRESSION: Technically successful right IJ power-injectable port catheter placement. Ready for routine use.   Electronically Signed   By: Lucrezia Europe M.D.   On: 10/21/2014 11:14   Ir US Guide Vasc Access Right  10/21/2014   CLINICAL DATA:  Primary right lung  carcinoma, needs access for chemotherapy  EXAM: TUNNELED PORT CATHETER PLACEMENT WITH ULTRASOUND AND FLUOROSCOPIC GUIDANCE  FLUOROSCOPY TIME:  30 seconds, 5 mGy  ANESTHESIA/SEDATION: Intravenous Fentanyl and Versed were administered as conscious sedation during continuous cardiorespiratory monitoring by the radiology RN, with a total moderate sedation time of 17 minutes.  TECHNIQUE: The procedure, risks, benefits, and alternatives were explained to the patient. Questions regarding the procedure were encouraged and answered. The patient understands and consents to the procedure. As antibiotic prophylaxis, cefazolin 2 g was ordered pre-procedure and administered intravenously within one hour of incision. Patency of the right IJ vein was confirmed with ultrasound with image documentation. An appropriate skin site was determined. Skin site was marked. Region was prepped using maximum barrier technique including cap and mask, sterile gown, sterile gloves, large sterile sheet, and Chlorhexidine as cutaneous antisepsis. The region was  infiltrated locally with 1% lidocaine. Under real-time ultrasound guidance, the right IJ vein was accessed with a 21 gauge micropuncture needle; the needle tip within the vein was confirmed with ultrasound image documentation. Needle was exchanged over a 018 guidewire for transitional dilator which allowed passage of the [facility] wire into the IVC. Over this, the transitional dilator was exchanged for a 5 [place] MPA catheter. A small incision was made on the right anterior chest wall and a subcutaneous pocket fashioned. The power-injectable port was positioned and its catheter tunneled to the right IJ dermatotomy site. The MPA catheter was exchanged over an Amplatz wire for a peel-away sheath, through which the port catheter, which had been trimmed to the appropriate length, was advanced and positioned under fluoroscopy with its tip at the cavoatrial junction. Spot chest radiograph confirms  good catheter position and no pneumothorax. The pocket was closed with deep interrupted and subcuticular continuous 3-0 Monocryl sutures. The port was flushed per protocol. The incisions were covered with Dermabond then covered with a sterile dressing.  COMPLICATIONS: COMPLICATIONS None immediate  IMPRESSION: Technically successful right IJ power-injectable port catheter placement. Ready for routine use.   Electronically Signed   By: Lucrezia Europe M.D.   On: 10/21/2014 11:14      IMPRESSION/PLAN: Today, I talked to the patient about the findings and work-up thus far. We discussed the patient's diagnosis of small cell lung cancer, STAGE III,  and general treatment for this, highlighting the role of radiotherapy in the management. We discussed the available radiation techniques, and focused on the details of logistics and delivery.    We discussed the risks, benefits, and side effects of radiotherapy to the chest for approx 6 weeks. Side effects may include but not necessarily be limited to: esophagitis, skin irritation, fatigue, rare internal organ injury. No guarantees of treatment were given. A consent form was signed and placed in the patient's medical record. The patient was encouraged to ask questions that I answered to the best of my ability. We will proceed with RT planning later this week.  Will order Baseline PFTs Social work consult for anxiety Nutrition consult - anorexia. IV fluids today - patient unsteady, suspect related in part due to poor po intake, dehydration, labs/orthostatic vitals noted.  __________________________________________   [name], MD

## 2014-10-25 ENCOUNTER — Encounter: Payer: Self-pay | Admitting: *Deleted

## 2014-10-25 ENCOUNTER — Ambulatory Visit: Payer: Medicare Other

## 2014-10-25 NOTE — Progress Notes (Signed)
[facility] Psychosocial Distress Screening Clinical Social Work  Clinical Social Work was referred by distress screening protocol.  The patient scored a 8 on the Psychosocial Distress Thermometer which indicates severe distress. Clinical Social Worker reviewed chart and chaplain, Lorrin Jackson met with pt on 10/24/14 to assess for distress and other psychosocial needs. Please see copy of chaplain's note as noted below. CSW team will be available at future appointments as well.   ONCBCN DISTRESS SCREENING 10/24/2014  Screening Type Initial Screening  Distress experienced in past week (1-10) 8  Emotional problem type Nervousness/Anxiety;Adjusting to illness;Feeling hopeless  Spiritual/Religous concerns type Loss of sense of purpose  Physical Problem type Pain;Getting around;Loss of appetitie;Constipation/diarrhea  Physician notified of physical symptoms Yes  Referral to clinical social work Yes    Spiritual Care Note  Followed up with Ms Schmutz, who goes by "Chickie," and her son Wille Glaser and daughter-in-law Langley Gauss at [facility], where Chickie was receiving fluids. Received original referral from [place], [place] via Polo Riley, LCSW due to high score on distress screen, as well as poor appetite/hydration and weight loss.   Provided further spiritual and emotional support to pt and family. Son and DIL are feeling caregiver distress and fatigue. Chickie reports no appetite or interest in food, naming that she is indeed "fighting" (that is, approaching tx seriously as a route to longer, better life) but that nothing appeals to her. Encouraged her to see RD for support and suggestions for maximizing calories/nutrition; pt indicates that appointment is already scheduled. Explored motivations and coping strategies. Assisted pt and family in identifying and verbalizing common goals, using repeatable communication tools. Provided encouragement and affirmation as Chickie walked from clinic to [facility]  hospital entrance. Pt and family left with a lunch plan and specific eating goals in mind.  Pt and family verbalized appreciation and are aware of ongoing chaplain availability. Please also page as needs arise. Thank you.  [address] Lorrin Jackson, [place] Pager [phone] Voicemail [phone]           Clinical Social Worker follow up needed: Yes.    If yes, follow up plan: See above Loren Racer, [place] Worker [place]  [facility] Phone: [phone] Fax: [phone]

## 2014-10-26 ENCOUNTER — Ambulatory Visit (HOSPITAL_BASED_OUTPATIENT_CLINIC_OR_DEPARTMENT_OTHER): Payer: Medicare Other | Admitting: Hematology

## 2014-10-26 ENCOUNTER — Ambulatory Visit
Admission: RE | Admit: 2014-10-26 | Discharge: 2014-10-26 | Disposition: A | Discharge status: Home / Self Care | Payer: Medicare Other | Source: Ambulatory Visit | Attending: Radiation Oncology | Admitting: Radiation Oncology

## 2014-10-26 ENCOUNTER — Other Ambulatory Visit (HOSPITAL_BASED_OUTPATIENT_CLINIC_OR_DEPARTMENT_OTHER): Payer: Medicare Other

## 2014-10-26 ENCOUNTER — Ambulatory Visit: Payer: Medicare Other

## 2014-10-26 ENCOUNTER — Encounter: Payer: Self-pay | Admitting: Hematology

## 2014-10-26 VITALS — BP 129/57 | HR 67 | Temp 97.5°F | Resp 17 | Ht 63.0 in | Wt 139.6 lb

## 2014-10-26 VITALS — Wt 140.1 lb

## 2014-10-26 DIAGNOSIS — E876 Hypokalemia: Secondary | ICD-10-CM

## 2014-10-26 DIAGNOSIS — F419 Anxiety disorder, unspecified: Secondary | ICD-10-CM | POA: Diagnosis not present

## 2014-10-26 DIAGNOSIS — E119 Type 2 diabetes mellitus without complications: Secondary | ICD-10-CM

## 2014-10-26 DIAGNOSIS — C781 Secondary malignant neoplasm of mediastinum: Secondary | ICD-10-CM | POA: Diagnosis not present

## 2014-10-26 DIAGNOSIS — Z510 Encounter for antineoplastic radiation therapy: Secondary | ICD-10-CM | POA: Diagnosis not present

## 2014-10-26 DIAGNOSIS — F411 Generalized anxiety disorder: Secondary | ICD-10-CM

## 2014-10-26 DIAGNOSIS — E875 Hyperkalemia: Secondary | ICD-10-CM

## 2014-10-26 DIAGNOSIS — E039 Hypothyroidism, unspecified: Secondary | ICD-10-CM | POA: Diagnosis not present

## 2014-10-26 DIAGNOSIS — C3490 Malignant neoplasm of unspecified part of unspecified bronchus or lung: Secondary | ICD-10-CM

## 2014-10-26 DIAGNOSIS — E46 Unspecified protein-calorie malnutrition: Secondary | ICD-10-CM

## 2014-10-26 DIAGNOSIS — C342 Malignant neoplasm of middle lobe, bronchus or lung: Secondary | ICD-10-CM | POA: Insufficient documentation

## 2014-10-26 LAB — COMPREHENSIVE METABOLIC PANEL (CC13)
ALT: 24 U/L (ref 0–55)
AST: 17 U/L (ref 5–34)
Albumin: 3 g/dL — ABNORMAL LOW (ref 3.5–5.0)
Alkaline Phosphatase: 42 U/L (ref 40–150)
Anion Gap: 6 mEq/L (ref 3–11)
BILIRUBIN TOTAL: 0.46 mg/dL (ref 0.20–1.20)
BUN: 29 mg/dL — ABNORMAL HIGH (ref 7.0–26.0)
CHLORIDE: 100 meq/L (ref 98–109)
CO2: 25 meq/L (ref 22–29)
CREATININE: 0.8 mg/dL (ref 0.6–1.1)
Calcium: 8 mg/dL — ABNORMAL LOW (ref 8.4–10.4)
EGFR: 71 mL/min/{1.73_m2} — ABNORMAL LOW (ref >90)
GLUCOSE: 159 mg/dL — AB (ref 70–140)
Potassium: 4.8 mEq/L (ref 3.5–5.1)
SODIUM: 131 meq/L — AB (ref 136–145)
TOTAL PROTEIN: 5.4 g/dL — AB (ref 6.4–8.3)

## 2014-10-26 LAB — CBC & DIFF AND RETIC
BASO%: 0.2 % (ref 0.0–2.0)
Basophils Absolute: 0 10*3/uL (ref 0.0–0.1)
EOS%: 2 % (ref 0.0–7.0)
Eosinophils Absolute: 0.1 10*3/uL (ref 0.0–0.5)
HCT: 29.2 % — ABNORMAL LOW (ref 34.8–46.6)
HGB: 10.3 g/dL — ABNORMAL LOW (ref 11.6–15.9)
IMMATURE RETIC FRACT: 2.7 % (ref 1.60–10.00)
LYMPH#: 1.1 10*3/uL (ref 0.9–3.3)
LYMPH%: 17.9 % (ref 14.0–49.7)
MCH: 30.5 pg (ref 25.1–34.0)
MCHC: 35.3 g/dL (ref 31.5–36.0)
MCV: 86.4 fL (ref 79.5–101.0)
MONO#: 0.1 10*3/uL (ref 0.1–0.9)
MONO%: 1.2 % (ref 0.0–14.0)
NEUT%: 78.7 % — AB (ref 38.4–76.8)
NEUTROS ABS: 4.7 10*3/uL (ref 1.5–6.5)
Platelets: 163 10*3/uL (ref 145–400)
RBC: 3.38 10*6/uL — AB (ref 3.70–5.45)
RDW: 14.3 % (ref 11.2–14.5)
RETIC CT ABS: 8.11 10*3/uL — AB (ref 33.70–90.70)
WBC: 6 10*3/uL (ref 3.9–10.3)

## 2014-10-26 LAB — MAGNESIUM (CC13): Magnesium: 1.9 mg/dl (ref 1.5–2.5)

## 2014-10-26 MED ORDER — SODIUM CHLORIDE 0.9 % IJ SOLN
10.0000 mL | Freq: Once | INTRAMUSCULAR | Status: AC
  Administered 2014-10-26: 10 mL via INTRAVENOUS

## 2014-10-26 MED ORDER — HEPARIN SOD (PORK) LOCK FLUSH 100 UNIT/ML IV SOLN
500.0000 [IU] | Freq: Once | INTRAVENOUS | Status: AC
  Administered 2014-10-26: 500 [IU] via INTRAVENOUS

## 2014-10-26 NOTE — Progress Notes (Addendum)
Patient arrived ambulatory, here for IV start for Ct simulation,gave name and dob as Identification, labs 10/21/14 BUN+22, CR=0.9, patient states she feels better  After getting IVF'S the other day,drinking more also, not allergic to IV dye,  Doesn't take metformin, emla cream over right port a cath,  Accessed by Joaquim Lai, RN,  X 1 attempt  Excellent blood return, called Joellen Jersey, RT therapist patient is ready for Ct simulation

## 2014-10-26 NOTE — Progress Notes (Signed)
  Radiation Oncology         [phone] ________________________________  Name: Rachel Chapman MRN: [id number]  Date: 10/26/2014  DOB: 11/29/1940  SIMULATION AND TREATMENT PLANNING NOTE  Outpatient  DIAGNOSIS:     ICD-9-CM ICD-10-CM   1. Cancer of middle lobe of lung 162.4 C34.2     NARRATIVE:  The patient was brought to the [facility].  Identity was confirmed.  All relevant records and images related to the planned course of therapy were reviewed.  The patient freely provided informed written consent to proceed with treatment after reviewing the details related to the planned course of therapy. The consent form was witnessed and verified by the simulation staff.    Then, the patient was set-up in a stable reproducible  supine position for radiation therapy.  CT images were obtained with IV constrast.  Surface markings were placed.  The CT images were loaded into the planning software.    TREATMENT PLANNING NOTE: Treatment planning then occurred.  The radiation prescription was entered and confirmed.    A total of 4 medically necessary complex treatment devices were fabricated and supervised by me - 4 fields with MLCs to block heart, lungs, cord, esophagus . I have requested : 3D Simulation  I have requested a DVH of the following structures: heart, lungs, cord, esophagus.  I have ordered:Nutrition Consult IMRT may be needed if the [facility] does not demonstrate adequate sparing of her normal tissues.  The patient will receive 60 Gy in 30 fractions to the gross disease in her chest.  Special Treatment Procedure Note: The patient will be receiving chemotherapy concurrently. Chemotherapy heightens the risk of side effects. I have considered this during the patient's treatment planning process and will monitor the patient accordingly for side effects on a weekly basis. Concurrent chemotherapy increases the complexity of this patient's treatment and therefore this constitutes  a special treatment procedure.  -----------------------------------  [name], MD

## 2014-10-28 ENCOUNTER — Telehealth: Payer: Self-pay | Admitting: *Deleted

## 2014-10-28 NOTE — Telephone Encounter (Signed)
-----   Message from Arna Snipe, RN sent at 10/19/2014  4:20 PM EDT ----- Regarding: Irene Limbo - cHEMO fOLLOW-UP CALL. Contact: [phone] This is daughter-in-law's cell. Pt lives with her and her husband. First time carbo/VP16. Her IV was in her left arm and was tender when she was discharged.

## 2014-10-28 NOTE — Telephone Encounter (Signed)
MD vist this week. Patient is not having any issues after chemotherapy. Patient tolerated well.

## 2014-10-28 NOTE — Progress Notes (Signed)
Received patient in the clinic following simulation. Obtained blood from power port per protocol to satisfy need for labs. Flushed power port per protocol. Removed access needle from power port. Needle intact upon removal. Applied an occlusive dressing over the site. Patient tolerated this well. Discharged patient with family to proceed onto appointment with Dr. Irene Limbo. Delivered vials from blood draw to lab.

## 2014-10-31 ENCOUNTER — Encounter: Payer: Self-pay | Admitting: General Practice

## 2014-10-31 ENCOUNTER — Ambulatory Visit: Payer: Medicare Other | Admitting: Nutrition

## 2014-10-31 ENCOUNTER — Ambulatory Visit (HOSPITAL_COMMUNITY)
Admission: RE | Admit: 2014-10-31 | Discharge: 2014-10-31 | Disposition: A | Discharge status: Home / Self Care | Payer: Medicare Other | Source: Ambulatory Visit | Attending: Radiation Oncology | Admitting: Radiation Oncology

## 2014-10-31 DIAGNOSIS — Z510 Encounter for antineoplastic radiation therapy: Secondary | ICD-10-CM | POA: Diagnosis not present

## 2014-10-31 DIAGNOSIS — F17210 Nicotine dependence, cigarettes, uncomplicated: Secondary | ICD-10-CM | POA: Insufficient documentation

## 2014-10-31 DIAGNOSIS — C342 Malignant neoplasm of middle lobe, bronchus or lung: Secondary | ICD-10-CM | POA: Diagnosis not present

## 2014-10-31 DIAGNOSIS — R0609 Other forms of dyspnea: Secondary | ICD-10-CM | POA: Diagnosis not present

## 2014-10-31 LAB — PULMONARY FUNCTION TEST
DL/VA % pred: 70 %
DL/VA: 3.31 ml/min/mmHg/L
DLCO COR % PRED: 50 %
DLCO cor: 11.57 ml/min/mmHg
DLCO unc % pred: 44 %
DLCO unc: 10.29 ml/min/mmHg
FEF 25-75 POST: 1.76 L/s
FEF 25-75 Pre: 1.23 L/sec
FEF2575-%Change-Post: 43 %
FEF2575-%Pred-Post: 116 %
FEF2575-%Pred-Pre: 81 %
FEV1-%CHANGE-POST: 10 %
FEV1-%Pred-Post: 84 %
FEV1-%Pred-Pre: 76 %
FEV1-POST: 1.65 L
FEV1-Pre: 1.5 L
FEV1FVC-%CHANGE-POST: 3 %
FEV1FVC-%Pred-Pre: 101 %
FEV6-%Change-Post: 6 %
FEV6-%PRED-PRE: 79 %
FEV6-%Pred-Post: 84 %
FEV6-PRE: 1.97 L
FEV6-Post: 2.1 L
FEV6FVC-%Pred-Post: 105 %
FEV6FVC-%Pred-Pre: 105 %
FVC-%CHANGE-POST: 6 %
FVC-%PRED-PRE: 74 %
FVC-%Pred-Post: 79 %
FVC-POST: 2.1 L
FVC-PRE: 1.97 L
POST FEV6/FVC RATIO: 100 %
PRE FEV1/FVC RATIO: 76 %
PRE FEV6/FVC RATIO: 100 %
Post FEV1/FVC ratio: 79 %
RV % pred: 86 %
RV: 1.97 L
TLC % PRED: 82 %
TLC: 4.02 L

## 2014-10-31 MED ORDER — ALBUTEROL SULFATE (2.5 MG/3ML) 0.083% IN NEBU
2.5000 mg | INHALATION_SOLUTION | Freq: Once | RESPIRATORY_TRACT | Status: AC
  Administered 2014-10-31: 2.5 mg via RESPIRATORY_TRACT

## 2014-10-31 NOTE — Progress Notes (Signed)
Spiritual Care Note  Spoke with pt Rachel Chapman, son Wille Glaser, and DIL Langley Gauss in [employer].  Family reports intermittent improvement in appetite/eating and energy/affect.  Pt reports some improvement with walking and also continued experiences with dizziness.  Rachel Chapman was less talkative today and appeared to have lower energy than in some of our past encounters.  She indicated joy at seeing her granddaughters' performance in their high school marching band's half-time show; band is a significant source of meaning in their household, including Jon's active involvement in practices and travel.  Son and DIL indicate consternation at pt's lack of eating, "even though we offer her all kinds of choices--everything she likes or mentions."  Pt and family seem to benefit most from opportunities to speak with chaplain in pairs or individually, in encounters that encourage more depth and privacy/intimacy of sharing.  Will continue to offer such opportunities as I see pt/family.   Pt/family aware of ongoing chaplain availability, but please also page as needs arise.  Thank you.  [place], [place] Pager [phone] Voicemail  [phone]

## 2014-10-31 NOTE — Progress Notes (Signed)
74 year old female diagnosed with small cell lung cancer. She is a patient of Dr. Irene Limbo.  Past medical history includes hypertension, dyslipidemia, diabetes, hypothyroidism, pneumonia, tobacco, and anxiety.  Medications include vitamin D, Celexa, Synthroid, multivitamin, Zofran, Januvia, vitamin B12.  Labs include sodium 131, glucose 159, and albumin 3.0 on September 14.  Height: 5 feet 3 inches. Weight: 140.1 pounds September 14. Usual body weight: 150 pounds. BMI: 24.82.  Patient prefers to be called "Rachel Chapman". Patient reports poor appetite. She endorses approximate 10 pound weight loss. Patient does not enjoy eating meat but does enjoy yogurt and some other protein containing foods. Patient has alternating constipation and diarrhea. Patient's family attends nutrition assessment with patient and son reports patient is highly "suggestible." She currently does not consume oral nutrition supplements.  Nutrition diagnosis: Unintended weight loss related to poor appetite as evidenced by 10 pound weight loss from usual body weight.  Intervention: Educated patient to increase meals and snacks to 6 times daily. Educated patient on high-protein foods and recommended patient consume high protein foods 6 times daily. Reviewed how to add additional calories. Recommended patient try oral nutrition supplements including Carnation breakfast.  Provided a variety of samples. Discouraged continued weight loss. Fact sheets were provided.  Questions were answered.  Teach back method was used.  Contact information was given.  Monitoring, evaluation, goals: Patient will work to increase calories and protein to minimize further weight loss.  Next visit: Wednesday, September 28, during infusion.  **Disclaimer: This note was dictated with voice recognition software. Similar sounding words can inadvertently be transcribed and this note may contain transcription errors which may not have been corrected upon  publication of note.**

## 2014-11-01 ENCOUNTER — Ambulatory Visit (INDEPENDENT_AMBULATORY_CARE_PROVIDER_SITE_OTHER): Payer: Medicare Other | Admitting: Adult Health

## 2014-11-01 ENCOUNTER — Encounter: Payer: Self-pay | Admitting: Adult Health

## 2014-11-01 VITALS — HR 78 | Temp 98.4°F | Resp 16 | Ht 63.0 in | Wt 140.0 lb

## 2014-11-01 DIAGNOSIS — Z7689 Persons encountering health services in other specified circumstances: Secondary | ICD-10-CM

## 2014-11-01 DIAGNOSIS — E119 Type 2 diabetes mellitus without complications: Secondary | ICD-10-CM | POA: Diagnosis not present

## 2014-11-01 DIAGNOSIS — E039 Hypothyroidism, unspecified: Secondary | ICD-10-CM

## 2014-11-01 DIAGNOSIS — Z7189 Other specified counseling: Secondary | ICD-10-CM

## 2014-11-01 DIAGNOSIS — E785 Hyperlipidemia, unspecified: Secondary | ICD-10-CM | POA: Insufficient documentation

## 2014-11-01 DIAGNOSIS — I10 Essential (primary) hypertension: Secondary | ICD-10-CM | POA: Diagnosis not present

## 2014-11-01 DIAGNOSIS — Z760 Encounter for issue of repeat prescription: Secondary | ICD-10-CM

## 2014-11-01 MED ORDER — LEVOTHYROXINE SODIUM 112 MCG PO TABS: 112.0000 ug | ORAL_TABLET | Freq: Two times a day (BID) | ORAL | Status: DC

## 2014-11-01 MED ORDER — LISINOPRIL 10 MG PO TABS: 10.0000 mg | ORAL_TABLET | Freq: Every day | ORAL | Status: DC

## 2014-11-01 MED ORDER — ALBUTEROL SULFATE HFA 108 (90 BASE) MCG/ACT IN AERS: 2.0000 | INHALATION_SPRAY | Freq: Four times a day (QID) | RESPIRATORY_TRACT | Status: DC | PRN

## 2014-11-01 MED ORDER — VITAMIN D 1000 UNITS PO TABS: 1000.0000 [IU] | ORAL_TABLET | Freq: Every day | ORAL | Status: AC

## 2014-11-01 MED ORDER — VITAMIN B-12 1000 MCG PO TABS: 1000.0000 ug | ORAL_TABLET | Freq: Every day | ORAL | Status: DC

## 2014-11-01 MED ORDER — CITALOPRAM HYDROBROMIDE 10 MG PO TABS: 10.0000 mg | ORAL_TABLET | Freq: Every day | ORAL | Status: DC

## 2014-11-01 MED ORDER — SITAGLIPTIN PHOSPHATE 100 MG PO TABS: 100.0000 mg | ORAL_TABLET | Freq: Every day | ORAL | Status: DC

## 2014-11-01 MED ORDER — SIMVASTATIN 20 MG PO TABS: 20.0000 mg | ORAL_TABLET | Freq: Every day | ORAL | Status: DC

## 2014-11-01 NOTE — Progress Notes (Addendum)
HPI:  Rachel Chapman is here to establish care. She recently moved from [place] to [place] to be with her daughter while she undergoes treatment for small cell lung CA. She is with her daughter at this appointment.   Last PCP and physical: Has not had a physical in many years  Immunizations:Has not had any and does not want any Diet:Decreased appetite and poor eating habits. Does not like protein Exercise:Does not exercise.  Colonoscopy: never had and does not want one Dexa: 2005 Pap Smear:"44 years ago"  Mammogram:never had one, does not want one Eye: Does not have an eye doctor Has the following chronic problems that require follow up and concerns today:  Lung Cancer - Was diagnosed with small cell lung cancer in August 2016.PET/CT showed bulky mediastinal disease but no evidence of overt spread beyond the chest making this limited stage. She has started chemo and then begins five days a week for 7 weeks of radiation.    Diabetes  - Unknown how well controlled her diabetes is at this time. Per daughter, her oncologist recommended going off Metformin in order to add some weight for chemo and radiation. She is also taking Januvia  Hypertension - This appears to be well controlled on Bystolic. She does not monitor her blood pressure at home. She can no longer afford Bystolic and needs a cheaper option. She does not have any reported history of CHF  She reports SOB with exertion. Vision loss and decreased appetite.   ROS negative for unless reported above: fevers, chills, hearing  loss, chest pain, palpitations, leg claudication, struggling to breath,Not feeling congested in the chest, no orthopenia, no cough,no wheezing,no soft tissue swelling, no hemoptysis, melena, hematochezia, hematuria, falls, loc, si, or thoughts of self harm.    Past Medical History  Diagnosis Date  . Hypertension   . Dyslipidemia   . Diabetes   . Hypothyroidism   . Pneumonia 2007  . Smoker     1.5 packs  per day for 60 years.  Quit one month ago  . Anxiety   . Cancer   . Lung cancer 8/216    hilar/mediastinum lung scca    Past Surgical History  Procedure Laterality Date  . Cesarean section      Family History  Problem Relation Age of Onset  . Clotting disorder Neg Hx   . Cancer Mother     lung non smoker  . Cancer Sister     colon  . Diabetes Sister   . Diabetes Mother     Social History   Social History  . Marital Status: Widowed    Spouse Name: N/A  . Number of Children: N/A  . Years of Education: N/A   Social History Main Topics  . Smoking status: Former Smoker -- 1.50 packs/day for 60 years    Quit date: 09/12/2014  . Smokeless tobacco: Not on file  . Alcohol Use: No  . Drug Use: No  . Sexual Activity: No   Other Topics Concern  . Not on file   Social History Narrative   She has moved from [place]   She is retired from working with special education    Was married widowed twice.            Current outpatient prescriptions:  .  albuterol (PROVENTIL HFA;VENTOLIN HFA) 108 (90 BASE) MCG/ACT inhaler, Inhale 2 puffs into the lungs every 6 (six) hours as needed for shortness of breath. , Disp: , Rfl:  .  cholecalciferol (VITAMIN D) 1000 UNITS tablet, Take 1,000 Units by mouth daily., Disp: , Rfl:  .  citalopram (CELEXA) 10 MG tablet, Take 1 tablet (10 mg total) by mouth daily., Disp: 30 tablet, Rfl: 1 .  levothyroxine (SYNTHROID, LEVOTHROID) 112 MCG tablet, Take 112 mcg by mouth 2 (two) times daily., Disp: , Rfl:  .  lidocaine-prilocaine (EMLA) cream, Apply topically once., Disp: 30 each, Rfl: 2 .  LORazepam (ATIVAN) 0.5 MG tablet, Take 1 tablet (0.5 mg total) by mouth every 8 (eight) hours as needed for anxiety (or nausea). Could take 2 tab 30-45 mins prior to PET/CT and MRI Brain, Disp: 60 tablet, Rfl: 0 .  simvastatin (ZOCOR) 20 MG tablet, Take 20 mg by mouth at bedtime. , Disp: , Rfl:  .  sitaGLIPtin (JANUVIA) 100 MG tablet, Take 100 mg by mouth daily., Disp: ,  Rfl:  .  vitamin B-12 (CYANOCOBALAMIN) 1000 MCG tablet, Take 1,000 mcg by mouth daily., Disp: , Rfl:  .  lisinopril (PRINIVIL,ZESTRIL) 10 MG tablet, Take 1 tablet (10 mg total) by mouth daily., Disp: 90 tablet, Rfl: 1  EXAM:  Filed Vitals:   11/01/14 1150  Pulse: 78  Temp: 98.4 F (36.9 C)  Resp: 16    Body mass index is 24.81 kg/(m^2).  GENERAL: vitals reviewed and listed above, alert, oriented, appears well hydrated and in no acute distress. She appears tired.  HEENT: atraumatic, conjunttiva clear, no obvious abnormalities on inspection of external nose and ears  NECK: Neck is soft and supple without masses, no adenopathy or thyromegaly, trachea midline, no JVD. Normal range of motion.   LUNGS: clear to auscultation bilaterally, no wheezes, rales or rhonchi, good air movement  CV: Regular rate and rhythm, normal S1/S2, no audible murmurs, gallops, or rubs. No carotid bruit and no peripheral edema.   MS: moves all extremities without noticeable abnormality. No edema noted  Skin: warm and dry, no rash . Has port in right chest wall.   Extremities: No clubbing, cyanosis, or edema. Capillary refill is WNL. Pulses intact bilaterally in upper and lower extremities.   Neuro: CN II-XII intact, sensation and reflexes normal throughout, 5/5 muscle strength in bilateral upper and lower extremities. Normal finger to nose. Normal rapid alternating movements. Normal romberg. No pronator drift.   PSYCH: pleasant and cooperative, no obvious depression or anxiety  ASSESSMENT AND PLAN:  1. Diabetes type 2, controlled - Will check A1c at next visit.  - Consider adding glipizide  2. Essential hypertension - She does not have any history of CHF and was not on a ACE with her diabetes.  - lisinopril (PRINIVIL,ZESTRIL) 10 MG tablet; Take 1 tablet (10 mg total) by mouth daily.  Dispense: 90 tablet; Refill: 1 - Monitor BP at home. Keep log and bring it to physical. Inform me if BP readings over  694 systolic.  - D/C Bystolic 3. Hypothyroidism, unspecified hypothyroidism type - Will check TSH at next visit.   4. Encounter to establish care - Follow up as soon as you can for a CPE - Follow up sooner if needed - Eat high quality high protein foods to help increase weight.  Discussed the following assessment and plan:  -We reviewed the PMH, PSH, FH, SH, Meds and Allergies. -We provided refills for any medications we will prescribe as needed. -We addressed current concerns per orders and patient instructions. -We have asked for records for pertinent exams, studies, vaccines and notes from previous providers. -We have advised patient to follow up per  instructions below.   -Patient advised to return or notify a provider immediately if symptoms worsen or persist or new concerns arise.    [name], AGNP

## 2014-11-01 NOTE — Patient Instructions (Signed)
It was great meeting you today!  Please follow up at your convenience for the complete physical.   Continue to work on eating high calorie foods to get your weight up.   If you need anything in the meantime, please let me know. Good luck with the chemo and radiation over the next few weeks.

## 2014-11-01 NOTE — Addendum Note (Signed)
Addended by: Apolinar Junes on: 11/01/2014 04:16 PM   Modules accepted: Orders

## 2014-11-01 NOTE — Progress Notes (Signed)
Pre visit review using our clinic review tool, if applicable. No additional management support is needed unless otherwise documented below in the visit note. 

## 2014-11-01 NOTE — Addendum Note (Signed)
Addended by: Apolinar Junes on: 11/01/2014 04:30 PM   Modules accepted: Orders

## 2014-11-02 DIAGNOSIS — Z510 Encounter for antineoplastic radiation therapy: Secondary | ICD-10-CM | POA: Diagnosis not present

## 2014-11-03 NOTE — Progress Notes (Signed)
Marland Kitchen    HEMATOLOGY/ONCOLOGY CLINIC NOTE  Date of Service: 10/26/2014  CHIEF COMPLAINTS/PURPOSE OF CONSULTATION:  Newly diagnosed small cell carcinoma of the lung for further evaluation and management.  HISTORY OF PRESENTING ILLNESS: (plz see my initial clinic note for details on initial presentation)  Interval History  Patient is here for her scheduled clinic followup for toxicity check.  She appears much less answers than before and notes that her appetite is significantly improved.  She has been started on radiation therapy.  Sleeping better. No acute new symptoms.   MEDICAL HISTORY:  Past Medical History  Diagnosis Date  . Hypertension   . Dyslipidemia   . Diabetes   . Hypothyroidism   . Pneumonia 2007  . Smoker     1.5 packs per day for 60 years.  Quit one month ago  . Anxiety   . Cancer   . Lung cancer 8/216    hilar/mediastinum lung scca    SURGICAL HISTORY: Past Surgical History  Procedure Laterality Date  . Cesarean section      SOCIAL HISTORY: Social History   Social History  . Marital Status: Widowed    Spouse Name: N/A  . Number of Children: N/A  . Years of Education: N/A   Occupational History  . Not on file.   Social History Main Topics  . Smoking status: Former Smoker -- 1.50 packs/day for 60 years    Quit date: 09/12/2014  . Smokeless tobacco: Not on file  . Alcohol Use: No  . Drug Use: No  . Sexual Activity: No   Other Topics Concern  . Not on file   Social History Narrative   She has moved from [place]   She is retired from working with special education    Was married widowed twice.         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 Clotting disorder Neg Hx   . Cancer Mother     lung non smoker  . Cancer Sister     colon  . Diabetes Sister   . Diabetes Mother     ALLERGIES:  has No Known Allergies.  MEDICATIONS:  Current Outpatient Prescriptions    Medication Sig Dispense Refill  . lidocaine-prilocaine (EMLA) cream Apply topically once. 30 each 2  . LORazepam (ATIVAN) 0.5 MG tablet Take 1 tablet (0.5 mg total) by mouth every 8 (eight) hours as needed for anxiety (or nausea). Could take 2 tab 30-45 mins prior to PET/CT and MRI Brain 60 tablet 0  . albuterol (PROVENTIL HFA;VENTOLIN HFA) 108 (90 BASE) MCG/ACT inhaler Inhale 2 puffs into the lungs every 6 (six) hours as needed for shortness of breath. 3.7 g 3  . cholecalciferol (VITAMIN D) 1000 UNITS tablet Take 1 tablet (1,000 Units total) by mouth daily. 90 tablet 3  . citalopram (CELEXA) 10 MG tablet Take 1 tablet (10 mg total) by mouth daily. 30 tablet 3  . levothyroxine (SYNTHROID, LEVOTHROID) 112 MCG tablet Take 1 tablet (112 mcg total) by mouth 2 (two) times daily. 90 tablet 3  . lisinopril (PRINIVIL,ZESTRIL) 10 MG tablet Take 1 tablet (10 mg total) by mouth daily. 90 tablet 1  . simvastatin (ZOCOR) 20 MG tablet Take 1 tablet (20 mg total) by mouth at bedtime. 90 tablet 3  . sitaGLIPtin (JANUVIA) 100 MG tablet Take 1 tablet (100 mg total) by mouth daily. 30 tablet 3  . vitamin B-12 (CYANOCOBALAMIN) 1000  MCG tablet Take 1 tablet (1,000 mcg total) by mouth daily. 90 tablet 3   No current facility-administered medications for this visit.     REVIEW OF SYSTEMS:    10 Point review of Systems was done is negative except as noted above.  PHYSICAL EXAMINATION: ECOG PERFORMANCE STATUS: 1 - Symptomatic but completely ambulatory  . Filed Vitals:   10/26/14 1139  Height: '5\' 3"'$  (1.6 m)  Weight: 139 lb 9.6 oz (63.322 kg)   Filed Weights   10/26/14 1139  Weight: 139 lb 9.6 oz (63.322 kg)   .Body mass index is 24.74 kg/(m^2).  GENERAL:alert, in no acute distress but anxious SKIN: skin color, texture, turgor are normal, no rashes or significant lesions EYES: normal, conjunctiva are pink and non-injected, sclera clear OROPHARYNX:no exudate, no erythema and lips, buccal mucosa, and  tongue normal  NECK: supple, no JVD, thyroid normal size, non-tender, without nodularity LYMPH:  no palpable lymphadenopathy in the cervical, axillary or inguinal LUNGS: clear to auscultation with normal respiratory effort HEART: regular rate & rhythm,  no murmurs and no lower extremity edema ABDOMEN: abdomen soft, non-tender, normoactive bowel sounds  Musculoskeletal: no cyanosis of digits and no clubbing  PSYCH: alert & oriented x 3 with fluent speech NEURO: no focal motor/sensory deficits  LABORATORY DATA:  I have reviewed the data as listed  . CBC Latest Ref Rng 10/26/2014 10/21/2014 10/20/2014  WBC 3.9 - 10.3 10e3/uL 6.0 11.2(H) 11.4(H)  Hemoglobin 11.6 - 15.9 g/dL 10.3(L) 12.9 12.8  Hematocrit 34.8 - 46.6 % 29.2(L) 36.9 35.4  Platelets 145 - 400 10e3/uL 163 296 283    . CMP Latest Ref Rng 10/26/2014 10/21/2014 10/20/2014  Glucose 70 - 140 mg/dl 159(H) 174(H) 178(H)  BUN 7.0 - 26.0 mg/dL 29.0(H) 22(H) 17.7  Creatinine 0.6 - 1.1 mg/dL 0.8 0.93 1.0  Sodium 136 - 145 mEq/L 131(L) 128(L) 130(L)  Potassium 3.5 - 5.1 mEq/L 4.8 4.3 4.4  Chloride 101 - 111 mmol/L - 94(L) -  CO2 22 - 29 mEq/L '25 26 24  '$ Calcium 8.4 - 10.4 mg/dL 8.0(L) 9.5 9.8  Total Protein 6.4 - 8.3 g/dL 5.4(L) - 6.6  Total Bilirubin 0.20 - 1.20 mg/dL 0.46 - 0.67  Alkaline Phos 40 - 150 U/L 42 - 47  AST 5 - 34 U/L 17 - 19  ALT 0 - 55 U/L 24 - 26     RADIOGRAPHIC STUDIES:  CT chest without contrast [09/26/2014]-done at [facility].  There is left apical scarring with fibrous retraction that is grossly unchanged from the prior examination. There is a tree in bud opacification within the anterior aspect of the right upper lung field measuring 8 mm x 6 mm. There is a large paratracheal lymph node mass that measures 2.3 cm x 2.8 cm with extension into the right hilum.  There is a 2.9 cm subcarinal lymphadenopathy. Gallstones noted within the gallbladder. Impression Mediastinal lymphadenopathy  indicative of neoplasm.  9 mm right apical nodule.  Apical pleural based thickening, left-sided, chronic in nature.  [facility] pathology report specimen #[id number] Final diagnosis A] subcarinal lymph node #1 ultrasound-guided cell block- Small cell carcinoma B] subcarinal lymph node #2 ultrasound-guided cell block- Small cell carcinoma  Comment: Immunohistochemistry studies were performed and results of both the diagnosis positive for CD 56, pancytokeratin and synaptophysin.  Negative for chromogranin, CD3 and CD 20.  .Mr Jeri Cos Wo Contrast  10/19/2014   CLINICAL DATA:  New diagnosis of small cell lung cancer. Staging.  Patient complains of confusion and nausea.  EXAM: MRI HEAD WITHOUT AND WITH CONTRAST  TECHNIQUE: Multiplanar, multiecho pulse sequences of the brain and surrounding structures were obtained without and with intravenous contrast.  CONTRAST:  46m MULTIHANCE GADOBENATE DIMEGLUMINE 529 MG/ML IV SOLN  COMPARISON:  None.  FINDINGS: No evidence for acute infarction, hemorrhage, mass lesion, hydrocephalus, or extra-axial fluid. T2 shine through affecting the LEFT parietal subcortical white matter corresponds to an area of ischemic demyelination on FLAIR sequence. Generalized atrophy. Fairly advanced T2 and FLAIR hyperintensities throughout periventricular and subcortical white matter representing chronic microvascular ischemic change.  Flow voids are maintained throughout the carotid, basilar, and vertebral arteries. There are no areas of chronic hemorrhage.  Pituitary, pineal, and cerebellar tonsils unremarkable. No upper cervical lesions.  Post infusion, no abnormal intracranial enhancement of concern for metastatic disease. There may be a small 4 mm nonenhancing extra-axial lesion along the LEFT occipital extra-axial compartment, suspected meningioma or osteoma (see image 4 series 10.  There is slight linear enhancement associated with a LEFT superior frontal gyrus and  subcortical white matter. See image 36 series 11 and image 15 series 13. This is favored represent an incidental venous vascular anomaly, but short-term 6 weeks follow-up, using 3T imaging, is recommended for further evaluation.  No acute sinus or mastoid disease. Negative orbits. Scalp soft tissues unremarkable.  IMPRESSION: Slight linear enhancement associated with the LEFT superior frontal gyrus favored to represent an incidental occult vascular malformation. Short-term 6 week follow-up recommended using 3 T scanner.  No acute intracranial findings. No enhancing intracranial mass lesion is definitely observed.   Electronically Signed   By: JStaci RighterM.D.   On: 10/19/2014 08:47   Nm Pet Image Initial (pi) Skull Base To Thigh  10/19/2014   CLINICAL DATA:  Initial treatment strategy for small cell lung cancer.  EXAM: NUCLEAR MEDICINE PET SKULL BASE TO THIGH  TECHNIQUE: 6.6 mCi F-18 FDG was injected intravenously. Full-ring PET imaging was performed from the skull base to thigh after the radiotracer. CT data was obtained and used for attenuation correction and anatomic localization.  FASTING BLOOD GLUCOSE:  Value: 190 mg/dl  COMPARISON:  None.  FINDINGS: NECK  No hypermetabolic lymph nodes in the neck.  CHEST  There is a large hypermetabolic mass centered around the right hilar region and invading the mediastinum. This measures 10.6 cm in maximum length and has an SUV max equal to 15.0. Tumor extends into the sub- carinal and right paratracheal region. Encasement and narrowing of the right mainstem bronchus and right pulmonary artery noted. Hypermetabolic right paratracheal and sub- carinal adenopathy noted. Sub- carinal lymph node measures 2.5 cm and has an SUV max equal to 14.1. High right paratracheal lymph node measures 2.1 cm and has an SUV max equal to 15.8.Scarring identified within the left upper lobe. No pleural effusion.  ABDOMEN/PELVIS  No abnormal hypermetabolic activity within the liver, pancreas,  adrenal glands, or spleen. Multiple stones identified within the gallbladder. Aortic atherosclerosis. No hypermetabolic lymph nodes in the abdomen or pelvis.  SKELETON  Degenerative disc disease is identified within the lumbar spine. There is an anterolisthesis of L5 on S1 bilateral L5 pars defects are present. There is heterogeneous marrow uptake identified. No definite evidence for hypermetabolic bone metastases. No focal hypermetabolic activity to suggest skeletal metastasis.  IMPRESSION: 1. Intensely hypermetabolic right lung mass compatible with primary small cell lung cancer. There is evidence of involvement of the right mainstem bronchus as well as invasion of the mediastinum including  the sub- carinal region. 2. Hypermetabolic sub- carinal and right paratracheal lymph node metastasis. 3. No evidence for distant metastatic disease. 4. Gallstones 5. Lumbar spondylosis 6. Aortic atherosclerosis.   Electronically Signed   By: Kerby Moors M.D.   On: 10/19/2014 10:54   Ir Fluoro Guide Cv Line Right  10/21/2014   CLINICAL DATA:  Primary right lung carcinoma, needs access for chemotherapy  EXAM: TUNNELED PORT CATHETER PLACEMENT WITH ULTRASOUND AND FLUOROSCOPIC GUIDANCE  FLUOROSCOPY TIME:  30 seconds, 5 mGy  ANESTHESIA/SEDATION: Intravenous Fentanyl and Versed were administered as conscious sedation during continuous cardiorespiratory monitoring by the radiology RN, with a total moderate sedation time of 17 minutes.  TECHNIQUE: The procedure, risks, benefits, and alternatives were explained to the patient. Questions regarding the procedure were encouraged and answered. The patient understands and consents to the procedure. As antibiotic prophylaxis, cefazolin 2 g was ordered pre-procedure and administered intravenously within one hour of incision. Patency of the right IJ vein was confirmed with ultrasound with image documentation. An appropriate skin site was determined. Skin site was marked. Region was prepped  using maximum barrier technique including cap and mask, sterile gown, sterile gloves, large sterile sheet, and Chlorhexidine as cutaneous antisepsis. The region was infiltrated locally with 1% lidocaine. Under real-time ultrasound guidance, the right IJ vein was accessed with a 21 gauge micropuncture needle; the needle tip within the vein was confirmed with ultrasound image documentation. Needle was exchanged over a 018 guidewire for transitional dilator which allowed passage of the [facility] wire into the IVC. Over this, the transitional dilator was exchanged for a 5 [place] MPA catheter. A small incision was made on the right anterior chest wall and a subcutaneous pocket fashioned. The power-injectable port was positioned and its catheter tunneled to the right IJ dermatotomy site. The MPA catheter was exchanged over an Amplatz wire for a peel-away sheath, through which the port catheter, which had been trimmed to the appropriate length, was advanced and positioned under fluoroscopy with its tip at the cavoatrial junction. Spot chest radiograph confirms good catheter position and no pneumothorax. The pocket was closed with deep interrupted and subcuticular continuous 3-0 Monocryl sutures. The port was flushed per protocol. The incisions were covered with Dermabond then covered with a sterile dressing.  COMPLICATIONS: COMPLICATIONS None immediate  IMPRESSION: Technically successful right IJ power-injectable port catheter placement. Ready for routine use.   Electronically Signed   By: Lucrezia Europe M.D.   On: 10/21/2014 11:14   Ir US Guide Vasc Access Right  10/21/2014   CLINICAL DATA:  Primary right lung carcinoma, needs access for chemotherapy  EXAM: TUNNELED PORT CATHETER PLACEMENT WITH ULTRASOUND AND FLUOROSCOPIC GUIDANCE  FLUOROSCOPY TIME:  30 seconds, 5 mGy  ANESTHESIA/SEDATION: Intravenous Fentanyl and Versed were administered as conscious sedation during continuous cardiorespiratory monitoring by the radiology RN,  with a total moderate sedation time of 17 minutes.  TECHNIQUE: The procedure, risks, benefits, and alternatives were explained to the patient. Questions regarding the procedure were encouraged and answered. The patient understands and consents to the procedure. As antibiotic prophylaxis, cefazolin 2 g was ordered pre-procedure and administered intravenously within one hour of incision. Patency of the right IJ vein was confirmed with ultrasound with image documentation. An appropriate skin site was determined. Skin site was marked. Region was prepped using maximum barrier technique including cap and mask, sterile gown, sterile gloves, large sterile sheet, and Chlorhexidine as cutaneous antisepsis. The region was infiltrated locally with 1% lidocaine. Under real-time ultrasound guidance, the right  IJ vein was accessed with a 21 gauge micropuncture needle; the needle tip within the vein was confirmed with ultrasound image documentation. Needle was exchanged over a 018 guidewire for transitional dilator which allowed passage of the [facility] wire into the IVC. Over this, the transitional dilator was exchanged for a 5 [place] MPA catheter. A small incision was made on the right anterior chest wall and a subcutaneous pocket fashioned. The power-injectable port was positioned and its catheter tunneled to the right IJ dermatotomy site. The MPA catheter was exchanged over an Amplatz wire for a peel-away sheath, through which the port catheter, which had been trimmed to the appropriate length, was advanced and positioned under fluoroscopy with its tip at the cavoatrial junction. Spot chest radiograph confirms good catheter position and no pneumothorax. The pocket was closed with deep interrupted and subcuticular continuous 3-0 Monocryl sutures. The port was flushed per protocol. The incisions were covered with Dermabond then covered with a sterile dressing.  COMPLICATIONS: COMPLICATIONS None immediate  IMPRESSION: Technically  successful right IJ power-injectable port catheter placement. Ready for routine use.   Electronically Signed   By: Lucrezia Europe M.D.   On: 10/21/2014 11:14   ASSESSMENT & PLAN:   74 year old female with history of heavy smoking with  #1 Newly diagnosed Limited stage Small cell lung cancer -unknown staging at this time.  CT scan on 09/26/2014 at the outside hospital showed mediastinal adenopathy which was bronchoscopically biopsied and noted to be consistent with small cell lung cancer. No overt focal neurological deficits or symptoms suggestive of brain metastases at this time. PET/CT showed bulky hypermetabolic mediastinal disease consistent with limited stage small cell lung cancer. MRI brain with no overt CNS mets  Patient in for followup today and notes no overt prohibitive toxicities of this time.  No new acute concerns.  Improving oral intake.  Had her port placed uneventfully.  Plan -patient started with cycle of carboplatin + Etoposide on 10/19/2014. -continued followup for her scheduled RT. -She will see me before her second cycle of carboplatin and etoposide. -f/u with nutritional therapy  #2 protein calorie malnutrition and weight loss due to small cell lung cancer. -f/u with nutritional therapy.  #3 ex-smoker 90-pack-year history of smoking at one month ago. Continues to stay off her cigarettes.  #4 likely COPD -- has never been diagnosed as such.  He has been recently given an albuterol inhaler as needed.  Not oxygen dependent.  #5 hyperkalemia  - resolved on rpt labs  #6 hypothyroidism -continue with levothyroxine replacement  #7Diabetes patient has been controlled with oral hypoglycemics.  She has lost about 15-20 pounds. Plan Continue Januvia. -off her metformin to reduce its anorexic effects. -Patient will monitor blood sugars at home  #8 severe anxiety.  Patient is having insomnia and difficulty with relaxing. Patient appears much improved on Celexa and when  necessary lorazepam. Heart anxiety is from acute smoking cessation and due to her medical condition. Plan -continue on Celexa for long-term management of generalized anxiety. -continue lorazepam when necessary for acute anxiety attacks  RTC 11/09/2014 prior to second cycle of carboplatin and etoposide chemotherapy with Dr. Irene Limbo  I spent 20 minutes counseling the patient face to face. The total time spent in the appointment was 25 minutes and more than 50% was on counseling and direct patient cares.    Sullivan Lone MD [place] AAHIVMS [facility] Hematology/Oncology Physician [facility]  (Office):       [phone] (Work cell):  [phone] (Fax):  [phone] 10/20/2014   

## 2014-11-07 ENCOUNTER — Encounter: Payer: Self-pay | Admitting: Radiation Oncology

## 2014-11-07 ENCOUNTER — Ambulatory Visit
Admission: RE | Admit: 2014-11-07 | Discharge: 2014-11-07 | Disposition: A | Discharge status: Home / Self Care | Payer: Medicare Other | Source: Ambulatory Visit | Attending: Radiation Oncology | Admitting: Radiation Oncology

## 2014-11-07 ENCOUNTER — Ambulatory Visit: Payer: Medicare Other | Admitting: Radiation Oncology

## 2014-11-07 VITALS — BP 120/59 | HR 72 | Temp 97.5°F | Ht 63.0 in | Wt 142.5 lb

## 2014-11-07 DIAGNOSIS — Z510 Encounter for antineoplastic radiation therapy: Secondary | ICD-10-CM | POA: Diagnosis not present

## 2014-11-07 DIAGNOSIS — C342 Malignant neoplasm of middle lobe, bronchus or lung: Secondary | ICD-10-CM

## 2014-11-07 NOTE — Progress Notes (Signed)
Rachel Chapman is here for her first treatment. She denies shortness of breath, says she is eating ok. She does report some mild fatigue. She reports feeling dizzy when she got off the machine today. BP 120/59 mmHg  Pulse 72  Temp(Src) 97.5 F (36.4 C)  Ht '5\' 3"'$  (1.6 m)  Wt 142 lb 8 oz (64.638 kg)  BMI 25.25 kg/m2  Wt Readings from Last 3 Encounters:  11/07/14 142 lb 8 oz (64.638 kg)  11/01/14 140 lb (63.504 kg)  10/26/14 139 lb 9.6 oz (63.322 kg)

## 2014-11-07 NOTE — Progress Notes (Signed)
   Weekly Management Note:  Outpatient    ICD-9-CM ICD-10-CM   1. Cancer of middle lobe of lung 162.4 C34.2     Current Dose:  2 Gy  Projected Dose: 60 Gy   Narrative:  The patient presents for routine under treatment assessment.  CBCT/MVCT images/Port film x-rays were reviewed.  The chart was checked. Anxiety during tomotherapy treatments  Physical Findings:  Wt Readings from Last 3 Encounters:  11/07/14 142 lb 8 oz (64.638 kg)  11/01/14 140 lb (63.504 kg)  10/26/14 139 lb 9.6 oz (63.322 kg)    height is '5\' 3"'$  (1.6 m) and weight is 142 lb 8 oz (64.638 kg). Her temperature is 97.5 F (36.4 C). Her blood pressure is 120/59 and her pulse is 72.  NAD, early alopecia  CBC    Component Value Date/Time   WBC 6.0 10/26/2014 0911   WBC 11.2* 10/21/2014 0755   RBC 3.38* 10/26/2014 0911   RBC 4.24 10/21/2014 0755   HGB 10.3* 10/26/2014 0911   HGB 12.9 10/21/2014 0755   HCT 29.2* 10/26/2014 0911   HCT 36.9 10/21/2014 0755   PLT 163 10/26/2014 0911   PLT 296 10/21/2014 0755   MCV 86.4 10/26/2014 0911   MCV 87.0 10/21/2014 0755   MCH 30.5 10/26/2014 0911   MCH 30.4 10/21/2014 0755   MCHC 35.3 10/26/2014 0911   MCHC 35.0 10/21/2014 0755   RDW 14.3 10/26/2014 0911   RDW 14.4 10/21/2014 0755   LYMPHSABS 1.1 10/26/2014 0911   LYMPHSABS 1.4 10/21/2014 0755   MONOABS 0.1 10/26/2014 0911   MONOABS 0.7 10/21/2014 0755   EOSABS 0.1 10/26/2014 0911   EOSABS 0.2 10/21/2014 0755   BASOSABS 0.0 10/26/2014 0911   BASOSABS 0.0 10/21/2014 0755     CMP     Component Value Date/Time   NA 131* 10/26/2014 0911   NA 128* 10/21/2014 0755   K 4.8 10/26/2014 0911   K 4.3 10/21/2014 0755   CL 94* 10/21/2014 0755   CO2 25 10/26/2014 0911   CO2 26 10/21/2014 0755   GLUCOSE 159* 10/26/2014 0911   GLUCOSE 174* 10/21/2014 0755   BUN 29.0* 10/26/2014 0911   BUN 22* 10/21/2014 0755   CREATININE 0.8 10/26/2014 0911   CREATININE 0.93 10/21/2014 0755   CALCIUM 8.0* 10/26/2014 0911   CALCIUM 9.5  10/21/2014 0755   PROT 5.4* 10/26/2014 0911   ALBUMIN 3.0* 10/26/2014 0911   AST 17 10/26/2014 0911   ALT 24 10/26/2014 0911   ALKPHOS 42 10/26/2014 0911   BILITOT 0.46 10/26/2014 0911   GFRNONAA 58* 10/21/2014 0755   GFRAA >60 10/21/2014 0755     Impression:  The patient is tolerating radiotherapy.   Plan:  Continue radiotherapy as planned. Ativan PRN anxiety/claustrophia.  -----------------------------------  [name], MD

## 2014-11-07 NOTE — Progress Notes (Signed)
IMRT Device Note       ICD-9-CM ICD-10-CM   1. Cancer of middle lobe of lung 162.4 C34.2     6.6 delivered field widths represent one set of IMRT treatment devices. The code is [phone].  -----------------------------------  Rachel Gibson, MD

## 2014-11-08 ENCOUNTER — Ambulatory Visit: Payer: Medicare Other

## 2014-11-08 ENCOUNTER — Ambulatory Visit
Admission: RE | Admit: 2014-11-08 | Discharge: 2014-11-08 | Disposition: A | Discharge status: Home / Self Care | Payer: Medicare Other | Source: Ambulatory Visit | Attending: Radiation Oncology | Admitting: Radiation Oncology

## 2014-11-08 DIAGNOSIS — Z510 Encounter for antineoplastic radiation therapy: Secondary | ICD-10-CM | POA: Diagnosis not present

## 2014-11-09 ENCOUNTER — Ambulatory Visit
Admission: RE | Admit: 2014-11-09 | Discharge: 2014-11-09 | Disposition: A | Discharge status: Home / Self Care | Payer: Medicare Other | Source: Ambulatory Visit | Attending: Radiation Oncology | Admitting: Radiation Oncology

## 2014-11-09 ENCOUNTER — Ambulatory Visit: Payer: Medicare Other | Admitting: Nutrition

## 2014-11-09 ENCOUNTER — Encounter: Payer: Self-pay | Admitting: Hematology

## 2014-11-09 ENCOUNTER — Ambulatory Visit (HOSPITAL_BASED_OUTPATIENT_CLINIC_OR_DEPARTMENT_OTHER): Payer: Medicare Other

## 2014-11-09 ENCOUNTER — Ambulatory Visit: Payer: Medicare Other

## 2014-11-09 ENCOUNTER — Ambulatory Visit (HOSPITAL_BASED_OUTPATIENT_CLINIC_OR_DEPARTMENT_OTHER): Payer: Medicare Other | Admitting: Hematology

## 2014-11-09 ENCOUNTER — Other Ambulatory Visit (HOSPITAL_BASED_OUTPATIENT_CLINIC_OR_DEPARTMENT_OTHER): Payer: Medicare Other

## 2014-11-09 VITALS — BP 116/61 | HR 75 | Temp 97.7°F | Resp 18 | Ht 63.0 in | Wt 141.4 lb

## 2014-11-09 DIAGNOSIS — D6481 Anemia due to antineoplastic chemotherapy: Secondary | ICD-10-CM

## 2014-11-09 DIAGNOSIS — L640 Drug-induced androgenic alopecia: Secondary | ICD-10-CM | POA: Diagnosis not present

## 2014-11-09 DIAGNOSIS — T50905A Adverse effect of unspecified drugs, medicaments and biological substances, initial encounter: Secondary | ICD-10-CM

## 2014-11-09 DIAGNOSIS — C3490 Malignant neoplasm of unspecified part of unspecified bronchus or lung: Secondary | ICD-10-CM | POA: Diagnosis present

## 2014-11-09 DIAGNOSIS — Z5111 Encounter for antineoplastic chemotherapy: Secondary | ICD-10-CM | POA: Diagnosis present

## 2014-11-09 DIAGNOSIS — E119 Type 2 diabetes mellitus without complications: Secondary | ICD-10-CM | POA: Diagnosis not present

## 2014-11-09 DIAGNOSIS — Z510 Encounter for antineoplastic radiation therapy: Secondary | ICD-10-CM | POA: Diagnosis not present

## 2014-11-09 DIAGNOSIS — C781 Secondary malignant neoplasm of mediastinum: Secondary | ICD-10-CM | POA: Diagnosis not present

## 2014-11-09 DIAGNOSIS — Z95828 Presence of other vascular implants and grafts: Secondary | ICD-10-CM

## 2014-11-09 DIAGNOSIS — F419 Anxiety disorder, unspecified: Secondary | ICD-10-CM

## 2014-11-09 DIAGNOSIS — L658 Other specified nonscarring hair loss: Secondary | ICD-10-CM

## 2014-11-09 DIAGNOSIS — E039 Hypothyroidism, unspecified: Secondary | ICD-10-CM

## 2014-11-09 DIAGNOSIS — F411 Generalized anxiety disorder: Secondary | ICD-10-CM

## 2014-11-09 LAB — COMPREHENSIVE METABOLIC PANEL (CC13)
ALBUMIN: 3.5 g/dL (ref 3.5–5.0)
ALK PHOS: 66 U/L (ref 40–150)
ALT: 22 U/L (ref 0–55)
AST: 12 U/L (ref 5–34)
Anion Gap: 8 mEq/L (ref 3–11)
BUN: 22.3 mg/dL (ref 7.0–26.0)
CALCIUM: 9.2 mg/dL (ref 8.4–10.4)
CO2: 25 mEq/L (ref 22–29)
CREATININE: 1.1 mg/dL (ref 0.6–1.1)
Chloride: 101 mEq/L (ref 98–109)
EGFR: 48 mL/min/{1.73_m2} — ABNORMAL LOW (ref >90)
Glucose: 211 mg/dl — ABNORMAL HIGH (ref 70–140)
POTASSIUM: 4.6 meq/L (ref 3.5–5.1)
Sodium: 134 mEq/L — ABNORMAL LOW (ref 136–145)
Total Bilirubin: <0.3 mg/dL (ref 0.20–1.20)
Total Protein: 6.2 g/dL — ABNORMAL LOW (ref 6.4–8.3)

## 2014-11-09 LAB — CBC & DIFF AND RETIC
BASO%: 0.1 % (ref 0.0–2.0)
BASOS ABS: 0 10*3/uL (ref 0.0–0.1)
EOS%: 1.3 % (ref 0.0–7.0)
Eosinophils Absolute: 0.1 10*3/uL (ref 0.0–0.5)
HEMATOCRIT: 28.3 % — AB (ref 34.8–46.6)
HGB: 9.7 g/dL — ABNORMAL LOW (ref 11.6–15.9)
Immature Retic Fract: 22.2 % — ABNORMAL HIGH (ref 1.60–10.00)
LYMPH#: 1.2 10*3/uL (ref 0.9–3.3)
LYMPH%: 18.3 % (ref 14.0–49.7)
MCH: 30.3 pg (ref 25.1–34.0)
MCHC: 34.3 g/dL (ref 31.5–36.0)
MCV: 88.4 fL (ref 79.5–101.0)
MONO#: 1.2 10*3/uL — ABNORMAL HIGH (ref 0.1–0.9)
MONO%: 17.2 % — AB (ref 0.0–14.0)
NEUT#: 4.2 10*3/uL (ref 1.5–6.5)
NEUT%: 63.1 % (ref 38.4–76.8)
PLATELETS: 303 10*3/uL (ref 145–400)
RBC: 3.2 10*6/uL — ABNORMAL LOW (ref 3.70–5.45)
RDW: 15 % — ABNORMAL HIGH (ref 11.2–14.5)
RETIC CT ABS: 108.8 10*3/uL — AB (ref 33.70–90.70)
Retic %: 3.4 % — ABNORMAL HIGH (ref 0.70–2.10)
WBC: 6.7 10*3/uL (ref 3.9–10.3)

## 2014-11-09 LAB — MAGNESIUM (CC13): Magnesium: 2.1 mg/dl (ref 1.5–2.5)

## 2014-11-09 MED ORDER — SODIUM CHLORIDE 0.9 % IV SOLN
Freq: Once | INTRAVENOUS | Status: AC
  Administered 2014-11-09: 20 mL via INTRAVENOUS

## 2014-11-09 MED ORDER — MAGIC MOUTHWASH W/LIDOCAINE: 5.0000 mL | Freq: Four times a day (QID) | ORAL | Status: DC | PRN

## 2014-11-09 MED ORDER — SODIUM CHLORIDE 0.9 % IV SOLN
Freq: Once | INTRAVENOUS | Status: AC
  Administered 2014-11-09: 13:00:00 via INTRAVENOUS
  Filled 2014-11-09: qty 8

## 2014-11-09 MED ORDER — HEPARIN SOD (PORK) LOCK FLUSH 100 UNIT/ML IV SOLN
500.0000 [IU] | Freq: Once | INTRAVENOUS | Status: AC | PRN
  Administered 2014-11-09: 500 [IU]
  Filled 2014-11-09: qty 5

## 2014-11-09 MED ORDER — CARBOPLATIN CHEMO INJECTION 450 MG/45ML
344.0000 mg | Freq: Once | INTRAVENOUS | Status: AC
  Administered 2014-11-09: 340 mg via INTRAVENOUS
  Filled 2014-11-09: qty 34

## 2014-11-09 MED ORDER — LORAZEPAM 0.5 MG PO TABS: 0.5000 mg | ORAL_TABLET | Freq: Three times a day (TID) | ORAL | Status: DC | PRN

## 2014-11-09 MED ORDER — SODIUM CHLORIDE 0.9 % IV SOLN
100.0000 mg/m2 | Freq: Once | INTRAVENOUS | Status: AC
  Administered 2014-11-09: 170 mg via INTRAVENOUS
  Filled 2014-11-09: qty 8.5

## 2014-11-09 MED ORDER — SENNOSIDES-DOCUSATE SODIUM 8.6-50 MG PO TABS: 2.0000 | ORAL_TABLET | Freq: Every evening | ORAL | Status: DC | PRN

## 2014-11-09 MED ORDER — SODIUM CHLORIDE 0.9 % IJ SOLN
10.0000 mL | INTRAMUSCULAR | Status: DC | PRN
  Administered 2014-11-09: 10 mL
  Filled 2014-11-09: qty 10

## 2014-11-09 MED ORDER — SODIUM CHLORIDE 0.9 % IJ SOLN
10.0000 mL | INTRAMUSCULAR | Status: DC | PRN
  Administered 2014-11-09: 10 mL via INTRAVENOUS
  Filled 2014-11-09: qty 10

## 2014-11-09 NOTE — Progress Notes (Signed)
Nutrition follow-up completed with patient and family, during infusion for lung cancer. Patient reports she has a poor appetite. States she forces herself to eat. Patient unhappy with current weight and states she would like to weigh 134 pounds because her closes will look nicer. Weight documented 141.4 pounds on September 28 stable from 140.1 pounds September 14. Patient refuses oral nutrition supplements.  Nutrition diagnosis: Unintended weight loss improved.  Intervention:  Support and encouragement provided to continue increased calories and protein for weight maintenance. Reviewed high-calorie, high-protein snacks. Teach back method used.  Monitoring, evaluation, goals: Patient will continue to consume adequate calories and protein for weight maintenance.  Next visit: Thursday, October 20, during infusion.  **Disclaimer: This note was dictated with voice recognition software. Similar sounding words can inadvertently be transcribed and this note may contain transcription errors which may not have been corrected upon publication of note.**

## 2014-11-09 NOTE — Patient Instructions (Signed)
[facility] Discharge Instructions for Patients Receiving Chemotherapy  Today you received the following chemotherapy agents Carboplatin/Etoposide  To help prevent nausea and vomiting after your treatment, we encourage you to take your nausea medication     If you develop nausea and vomiting that is not controlled by your nausea medication, call the clinic.   BELOW ARE SYMPTOMS THAT SHOULD BE REPORTED IMMEDIATELY:  *FEVER GREATER THAN 100.5 F  *CHILLS WITH OR WITHOUT FEVER  NAUSEA AND VOMITING THAT IS NOT CONTROLLED WITH YOUR NAUSEA MEDICATION  *UNUSUAL SHORTNESS OF BREATH  *UNUSUAL BRUISING OR BLEEDING  TENDERNESS IN MOUTH AND THROAT WITH OR WITHOUT PRESENCE OF ULCERS  *URINARY PROBLEMS  *BOWEL PROBLEMS  UNUSUAL RASH Items with * indicate a potential emergency and should be followed up as soon as possible.  Feel free to call the clinic you have any questions or concerns. The clinic phone number is [phone].  Please show the [facility] at check-in to the Emergency Department and triage nurse.

## 2014-11-09 NOTE — Patient Instructions (Signed)

## 2014-11-10 ENCOUNTER — Ambulatory Visit
Admission: RE | Admit: 2014-11-10 | Discharge: 2014-11-10 | Disposition: A | Discharge status: Home / Self Care | Payer: Medicare Other | Source: Ambulatory Visit | Attending: Radiation Oncology | Admitting: Radiation Oncology

## 2014-11-10 ENCOUNTER — Ambulatory Visit (HOSPITAL_BASED_OUTPATIENT_CLINIC_OR_DEPARTMENT_OTHER): Payer: Medicare Other

## 2014-11-10 ENCOUNTER — Telehealth: Payer: Self-pay | Admitting: Hematology

## 2014-11-10 ENCOUNTER — Ambulatory Visit: Payer: Medicare Other

## 2014-11-10 VITALS — BP 125/58 | HR 83 | Temp 98.0°F | Resp 18

## 2014-11-10 DIAGNOSIS — Z5111 Encounter for antineoplastic chemotherapy: Secondary | ICD-10-CM | POA: Diagnosis present

## 2014-11-10 DIAGNOSIS — C3490 Malignant neoplasm of unspecified part of unspecified bronchus or lung: Secondary | ICD-10-CM | POA: Diagnosis not present

## 2014-11-10 DIAGNOSIS — C781 Secondary malignant neoplasm of mediastinum: Secondary | ICD-10-CM

## 2014-11-10 DIAGNOSIS — C342 Malignant neoplasm of middle lobe, bronchus or lung: Secondary | ICD-10-CM

## 2014-11-10 DIAGNOSIS — Z510 Encounter for antineoplastic radiation therapy: Secondary | ICD-10-CM | POA: Diagnosis not present

## 2014-11-10 MED ORDER — HEPARIN SOD (PORK) LOCK FLUSH 100 UNIT/ML IV SOLN
500.0000 [IU] | Freq: Once | INTRAVENOUS | Status: AC | PRN
  Administered 2014-11-10: 500 [IU]
  Filled 2014-11-10: qty 5

## 2014-11-10 MED ORDER — PROCHLORPERAZINE MALEATE 10 MG PO TABS
10.0000 mg | ORAL_TABLET | Freq: Once | ORAL | Status: AC
Start: 2014-11-10 — End: 2014-11-10
  Administered 2014-11-10: 10 mg via ORAL

## 2014-11-10 MED ORDER — SODIUM CHLORIDE 0.9 % IJ SOLN
10.0000 mL | INTRAMUSCULAR | Status: DC | PRN
  Administered 2014-11-10: 10 mL
  Filled 2014-11-10: qty 10

## 2014-11-10 MED ORDER — SODIUM CHLORIDE 0.9 % IV SOLN
100.0000 mg/m2 | Freq: Once | INTRAVENOUS | Status: AC
  Administered 2014-11-10: 170 mg via INTRAVENOUS
  Filled 2014-11-10: qty 8.5

## 2014-11-10 MED ORDER — PROCHLORPERAZINE MALEATE 10 MG PO TABS
ORAL_TABLET | ORAL | Status: AC
  Filled 2014-11-10: qty 1

## 2014-11-10 MED ORDER — BIAFINE EX EMUL
CUTANEOUS | Status: DC | PRN
  Administered 2014-11-10: 16:00:00 via TOPICAL

## 2014-11-10 MED ORDER — EMOLLIENT BASE EX CREA: TOPICAL_CREAM | CUTANEOUS | Status: DC | PRN

## 2014-11-10 MED ORDER — SODIUM CHLORIDE 0.9 % IV SOLN
Freq: Once | INTRAVENOUS | Status: AC
  Administered 2014-11-10: 13:00:00 via INTRAVENOUS

## 2014-11-10 NOTE — Progress Notes (Signed)
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  Diagnosis: Limited stage small cell lung cancer  Current treatment: Concurrent chemoradiation  HISTORY OF PRESENTING ILLNESS: (plz see my initial clinic note for details on initial presentation)  Interval History  Patient is here for her scheduled clinic followup prior to her second cycle of carboplatin and etoposide. She notes that her breathing is much improved. She had certainly appears more relaxed. Significant alopecia as expected. Has had 2 doses of her IMRT. No acute new concerns. No fevers or chills. No other infections. No significant fatigue. Given a prescription for a hair wig as requested.  Notes that she's been eating better and gaining weight.  MEDICAL HISTORY:  Past Medical History  Diagnosis Date  . Hypertension   . Dyslipidemia   . Diabetes   . Hypothyroidism   . Pneumonia 2007  . Smoker     1.5 packs per day for 60 years.  Quit one month ago  . Anxiety   . Cancer   . Lung cancer 8/216    hilar/mediastinum lung scca    SURGICAL HISTORY: Past Surgical History  Procedure Laterality Date  . Cesarean section      SOCIAL HISTORY: Social History   Social History  . Marital Status: Widowed    Spouse Name: N/A  . Number of Children: N/A  . Years of Education: N/A   Occupational History  . Not on file.   Social History Main Topics  . Smoking status: Former Smoker -- 1.50 packs/day for 60 years    Quit date: 09/12/2014  . Smokeless tobacco: Not on file  . Alcohol Use: No  . Drug Use: No  . Sexual Activity: No   Other Topics Concern  . Not on file   Social History Narrative   She has moved from [place]   She is retired from working with special education    Was married widowed twice.         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 Clotting disorder Neg Hx   . Cancer Mother     lung non smoker  . Cancer Sister     colon  . Diabetes Sister   . Diabetes Mother     ALLERGIES:  has No Known Allergies.  MEDICATIONS:  Current Outpatient Prescriptions  Medication Sig Dispense Refill  . albuterol (PROVENTIL HFA;VENTOLIN HFA) 108 (90 BASE) MCG/ACT inhaler Inhale 2 puffs into the lungs every 6 (six) hours as needed for shortness of breath. 3.7 g 3  . cholecalciferol (VITAMIN D) 1000 UNITS tablet Take 1 tablet (1,000 Units total) by mouth daily. 90 tablet 3  . citalopram (CELEXA) 10 MG tablet Take 1 tablet (10 mg total) by mouth daily. 30 tablet 3  . levothyroxine (SYNTHROID, LEVOTHROID) 112 MCG tablet Take 1 tablet (112 mcg total) by mouth 2 (two) times daily. 90 tablet 3  . lidocaine-prilocaine (EMLA) cream Apply topically once. 30 each 2  . lisinopril (PRINIVIL,ZESTRIL) 10 MG tablet Take 1 tablet (10 mg total) by mouth daily. 90 tablet 1  . LORazepam (ATIVAN) 0.5 MG tablet Take 1 tablet (0.5 mg total) by mouth every 8 (eight) hours as needed for anxiety (or nausea). Could take 2 tab 30-45 mins prior to PET/CT and MRI Brain 60 tablet 0  . ondansetron (ZOFRAN-ODT) 4 MG disintegrating  tablet Take 4 mg by mouth every 8 (eight) hours as needed for nausea or vomiting.    . simvastatin (ZOCOR) 20 MG tablet Take 1 tablet (20 mg total) by mouth at bedtime. 90 tablet 3  . sitaGLIPtin (JANUVIA) 100 MG tablet Take 1 tablet (100 mg total) by mouth daily. 30 tablet 3  . vitamin B-12 (CYANOCOBALAMIN) 1000 MCG tablet Take 1 tablet (1,000 mcg total) by mouth daily. 90 tablet 3  . magic mouthwash w/lidocaine SOLN Take 5 mLs by mouth 4 (four) times daily as needed for mouth pain. 200 mL 0  . senna-docusate (SENNA S) 8.6-50 MG tablet Take 2 tablets by mouth at bedtime as needed for mild constipation. 60 tablet 1   No current facility-administered medications for this visit.     REVIEW OF SYSTEMS:    10 Point  review of Systems was done is negative except as noted above.  PHYSICAL EXAMINATION: ECOG PERFORMANCE STATUS: 1 - Symptomatic but completely ambulatory  . Filed Vitals:   11/09/14 1103  Height: '5\' 3"'$  (1.6 m)  Weight: 141 lb 6.4 oz (64.139 kg)   Filed Weights   11/09/14 1103  Weight: 141 lb 6.4 oz (64.139 kg)   .Body mass index is 25.05 kg/(m^2).  GENERAL:alert, in no acute distress but anxious SKIN: skin color, texture, turgor are normal, no rashes or significant lesions EYES: normal, conjunctiva are pink and non-injected, sclera clear OROPHARYNX:no exudate, no erythema and lips, buccal mucosa, and tongue normal  NECK: supple, no JVD, thyroid normal size, non-tender, without nodularity LYMPH:  no palpable lymphadenopathy in the cervical, axillary or inguinal LUNGS: clear to auscultation with normal respiratory effort HEART: regular rate & rhythm,  no murmurs and no lower extremity edema ABDOMEN: abdomen soft, non-tender, normoactive bowel sounds  Musculoskeletal: no cyanosis of digits and no clubbing  PSYCH: alert & oriented x 3 with fluent speech NEURO: no focal motor/sensory deficits  LABORATORY DATA:  I have reviewed the data as listed  . CBC Latest Ref Rng 11/09/2014 10/26/2014 10/21/2014  WBC 3.9 - 10.3 10e3/uL 6.7 6.0 11.2(H)  Hemoglobin 11.6 - 15.9 g/dL 9.7(L) 10.3(L) 12.9  Hematocrit 34.8 - 46.6 % 28.3(L) 29.2(L) 36.9  Platelets 145 - 400 10e3/uL 303 163 296    . CMP Latest Ref Rng 11/09/2014 10/26/2014 10/21/2014  Glucose 70 - 140 mg/dl 211(H) 159(H) 174(H)  BUN 7.0 - 26.0 mg/dL 22.3 29.0(H) 22(H)  Creatinine 0.6 - 1.1 mg/dL 1.1 0.8 0.93  Sodium 136 - 145 mEq/L 134(L) 131(L) 128(L)  Potassium 3.5 - 5.1 mEq/L 4.6 4.8 4.3  Chloride 101 - 111 mmol/L - - 94(L)  CO2 22 - 29 mEq/L '25 25 26  '$ Calcium 8.4 - 10.4 mg/dL 9.2 8.0(L) 9.5  Total Protein 6.4 - 8.3 g/dL 6.2(L) 5.4(L) -  Total Bilirubin 0.20 - 1.20 mg/dL <0.30 0.46 -  Alkaline Phos 40 - 150 U/L 66 42 -  AST 5 -  34 U/L 12 17 -  ALT 0 - 55 U/L 22 24 -     RADIOGRAPHIC STUDIES:  CT chest without contrast [09/26/2014]-done at [facility].  There is left apical scarring with fibrous retraction that is grossly unchanged from the prior examination. There is a tree in bud opacification within the anterior aspect of the right upper lung field measuring 8 mm x 6 mm. There is a large paratracheal lymph node mass that measures 2.3 cm x 2.8 cm with extension into the right hilum.  There is a 2.9 cm  subcarinal lymphadenopathy. Gallstones noted within the gallbladder. Impression Mediastinal lymphadenopathy indicative of neoplasm.  9 mm right apical nodule.  Apical pleural based thickening, left-sided, chronic in nature.  [facility] pathology report specimen #[id number] Final diagnosis A] subcarinal lymph node #1 ultrasound-guided cell block- Small cell carcinoma B] subcarinal lymph node #2 ultrasound-guided cell block- Small cell carcinoma  Comment: Immunohistochemistry studies were performed and results of both the diagnosis positive for CD 56, pancytokeratin and synaptophysin.  Negative for chromogranin, CD3 and CD 20.  .Mr Jeri Cos Wo Contrast  10/19/2014   CLINICAL DATA:  New diagnosis of small cell lung cancer. Staging. Patient complains of confusion and nausea.  EXAM: MRI HEAD WITHOUT AND WITH CONTRAST  TECHNIQUE: Multiplanar, multiecho pulse sequences of the brain and surrounding structures were obtained without and with intravenous contrast.  CONTRAST:  15m MULTIHANCE GADOBENATE DIMEGLUMINE 529 MG/ML IV SOLN  COMPARISON:  None.  FINDINGS: No evidence for acute infarction, hemorrhage, mass lesion, hydrocephalus, or extra-axial fluid. T2 shine through affecting the LEFT parietal subcortical white matter corresponds to an area of ischemic demyelination on FLAIR sequence. Generalized atrophy. Fairly advanced T2 and FLAIR hyperintensities throughout periventricular and  subcortical white matter representing chronic microvascular ischemic change.  Flow voids are maintained throughout the carotid, basilar, and vertebral arteries. There are no areas of chronic hemorrhage.  Pituitary, pineal, and cerebellar tonsils unremarkable. No upper cervical lesions.  Post infusion, no abnormal intracranial enhancement of concern for metastatic disease. There may be a small 4 mm nonenhancing extra-axial lesion along the LEFT occipital extra-axial compartment, suspected meningioma or osteoma (see image 4 series 10.  There is slight linear enhancement associated with a LEFT superior frontal gyrus and subcortical white matter. See image 36 series 11 and image 15 series 13. This is favored represent an incidental venous vascular anomaly, but short-term 6 weeks follow-up, using 3T imaging, is recommended for further evaluation.  No acute sinus or mastoid disease. Negative orbits. Scalp soft tissues unremarkable.  IMPRESSION: Slight linear enhancement associated with the LEFT superior frontal gyrus favored to represent an incidental occult vascular malformation. Short-term 6 week follow-up recommended using 3 T scanner.  No acute intracranial findings. No enhancing intracranial mass lesion is definitely observed.   Electronically Signed   By: JStaci RighterM.D.   On: 10/19/2014 08:47   Nm Pet Image Initial (pi) Skull Base To Thigh  10/19/2014   CLINICAL DATA:  Initial treatment strategy for small cell lung cancer.  EXAM: NUCLEAR MEDICINE PET SKULL BASE TO THIGH  TECHNIQUE: 6.6 mCi F-18 FDG was injected intravenously. Full-ring PET imaging was performed from the skull base to thigh after the radiotracer. CT data was obtained and used for attenuation correction and anatomic localization.  FASTING BLOOD GLUCOSE:  Value: 190 mg/dl  COMPARISON:  None.  FINDINGS: NECK  No hypermetabolic lymph nodes in the neck.  CHEST  There is a large hypermetabolic mass centered around the right hilar region and invading  the mediastinum. This measures 10.6 cm in maximum length and has an SUV max equal to 15.0. Tumor extends into the sub- carinal and right paratracheal region. Encasement and narrowing of the right mainstem bronchus and right pulmonary artery noted. Hypermetabolic right paratracheal and sub- carinal adenopathy noted. Sub- carinal lymph node measures 2.5 cm and has an SUV max equal to 14.1. High right paratracheal lymph node measures 2.1 cm and has an SUV max equal to 15.8.Scarring identified within the left upper lobe. No pleural effusion.  ABDOMEN/PELVIS  No abnormal hypermetabolic activity within the liver, pancreas, adrenal glands, or spleen. Multiple stones identified within the gallbladder. Aortic atherosclerosis. No hypermetabolic lymph nodes in the abdomen or pelvis.  SKELETON  Degenerative disc disease is identified within the lumbar spine. There is an anterolisthesis of L5 on S1 bilateral L5 pars defects are present. There is heterogeneous marrow uptake identified. No definite evidence for hypermetabolic bone metastases. No focal hypermetabolic activity to suggest skeletal metastasis.  IMPRESSION: 1. Intensely hypermetabolic right lung mass compatible with primary small cell lung cancer. There is evidence of involvement of the right mainstem bronchus as well as invasion of the mediastinum including the sub- carinal region. 2. Hypermetabolic sub- carinal and right paratracheal lymph node metastasis. 3. No evidence for distant metastatic disease. 4. Gallstones 5. Lumbar spondylosis 6. Aortic atherosclerosis.   Electronically Signed   By: Kerby Moors M.D.   On: 10/19/2014 10:54   Ir Fluoro Guide Cv Line Right  10/21/2014   CLINICAL DATA:  Primary right lung carcinoma, needs access for chemotherapy  EXAM: TUNNELED PORT CATHETER PLACEMENT WITH ULTRASOUND AND FLUOROSCOPIC GUIDANCE  FLUOROSCOPY TIME:  30 seconds, 5 mGy  ANESTHESIA/SEDATION: Intravenous Fentanyl and Versed were administered as conscious sedation  during continuous cardiorespiratory monitoring by the radiology RN, with a total moderate sedation time of 17 minutes.  TECHNIQUE: The procedure, risks, benefits, and alternatives were explained to the patient. Questions regarding the procedure were encouraged and answered. The patient understands and consents to the procedure. As antibiotic prophylaxis, cefazolin 2 g was ordered pre-procedure and administered intravenously within one hour of incision. Patency of the right IJ vein was confirmed with ultrasound with image documentation. An appropriate skin site was determined. Skin site was marked. Region was prepped using maximum barrier technique including cap and mask, sterile gown, sterile gloves, large sterile sheet, and Chlorhexidine as cutaneous antisepsis. The region was infiltrated locally with 1% lidocaine. Under real-time ultrasound guidance, the right IJ vein was accessed with a 21 gauge micropuncture needle; the needle tip within the vein was confirmed with ultrasound image documentation. Needle was exchanged over a 018 guidewire for transitional dilator which allowed passage of the [facility] wire into the IVC. Over this, the transitional dilator was exchanged for a 5 [place] MPA catheter. A small incision was made on the right anterior chest wall and a subcutaneous pocket fashioned. The power-injectable port was positioned and its catheter tunneled to the right IJ dermatotomy site. The MPA catheter was exchanged over an Amplatz wire for a peel-away sheath, through which the port catheter, which had been trimmed to the appropriate length, was advanced and positioned under fluoroscopy with its tip at the cavoatrial junction. Spot chest radiograph confirms good catheter position and no pneumothorax. The pocket was closed with deep interrupted and subcuticular continuous 3-0 Monocryl sutures. The port was flushed per protocol. The incisions were covered with Dermabond then covered with a sterile dressing.   COMPLICATIONS: COMPLICATIONS None immediate  IMPRESSION: Technically successful right IJ power-injectable port catheter placement. Ready for routine use.   Electronically Signed   By: Lucrezia Europe M.D.   On: 10/21/2014 11:14   Ir US Guide Vasc Access Right  10/21/2014   CLINICAL DATA:  Primary right lung carcinoma, needs access for chemotherapy  EXAM: TUNNELED PORT CATHETER PLACEMENT WITH ULTRASOUND AND FLUOROSCOPIC GUIDANCE  FLUOROSCOPY TIME:  30 seconds, 5 mGy  ANESTHESIA/SEDATION: Intravenous Fentanyl and Versed were administered as conscious sedation during continuous cardiorespiratory monitoring by the radiology RN, with a total moderate sedation time  of 17 minutes.  TECHNIQUE: The procedure, risks, benefits, and alternatives were explained to the patient. Questions regarding the procedure were encouraged and answered. The patient understands and consents to the procedure. As antibiotic prophylaxis, cefazolin 2 g was ordered pre-procedure and administered intravenously within one hour of incision. Patency of the right IJ vein was confirmed with ultrasound with image documentation. An appropriate skin site was determined. Skin site was marked. Region was prepped using maximum barrier technique including cap and mask, sterile gown, sterile gloves, large sterile sheet, and Chlorhexidine as cutaneous antisepsis. The region was infiltrated locally with 1% lidocaine. Under real-time ultrasound guidance, the right IJ vein was accessed with a 21 gauge micropuncture needle; the needle tip within the vein was confirmed with ultrasound image documentation. Needle was exchanged over a 018 guidewire for transitional dilator which allowed passage of the [facility] wire into the IVC. Over this, the transitional dilator was exchanged for a 5 [place] MPA catheter. A small incision was made on the right anterior chest wall and a subcutaneous pocket fashioned. The power-injectable port was positioned and its catheter tunneled to the  right IJ dermatotomy site. The MPA catheter was exchanged over an Amplatz wire for a peel-away sheath, through which the port catheter, which had been trimmed to the appropriate length, was advanced and positioned under fluoroscopy with its tip at the cavoatrial junction. Spot chest radiograph confirms good catheter position and no pneumothorax. The pocket was closed with deep interrupted and subcuticular continuous 3-0 Monocryl sutures. The port was flushed per protocol. The incisions were covered with Dermabond then covered with a sterile dressing.  COMPLICATIONS: COMPLICATIONS None immediate  IMPRESSION: Technically successful right IJ power-injectable port catheter placement. Ready for routine use.   Electronically Signed   By: Lucrezia Europe M.D.   On: 10/21/2014 11:14   ASSESSMENT & PLAN:   74 year old female with history of heavy smoking with  #1 Limited stage Small cell lung cancer. CT scan on 09/26/2014 at the outside hospital showed mediastinal adenopathy which was bronchoscopically biopsied and noted to be consistent with small cell lung cancer. No overt focal neurological deficits or symptoms suggestive of brain metastases at this time. PET/CT showed bulky hypermetabolic mediastinal disease consistent with limited stage small cell lung cancer. MRI brain with no overt CNS mets  Patient seen in for followup today and notes no overt prohibitive toxicities of this time.  No new acute concerns.  Improving oral intake. No port-related issues.  #2 normocytic normochromic anemia related to chemotherapy. #3 alopecia related to chemotherapy Plan Patient stable to proceed with second cycle  of carboplatin + Etoposide starting today. -continued followup for her scheduled RT. -Given refill on her Zofran  -Given a prescription for Magic mouthwash in case she developed some radiation associated esophagitis . -Given refill for when necessary Ativan for nausea or anxiety . -She will see me before her  third cycle of carboplatin and etoposide with a repeat PET CT scan to assess response -Given prescription for hair wig.  #4 protein calorie malnutrition and weight loss due to small cell lung cancer.  improving oral intake and nutritional status  #5 ex-smoker 90-pack-year history of smoking at one month ago. Continues to stay off her cigarettes.  #4 likely COPD -- has never been diagnosed as such.  He has been recently given an albuterol inhaler as needed.  Not oxygen dependent.  #6 hyperkalemia  - resolved on rpt labs  #7 hypothyroidism -continue with levothyroxine replacement  #8 Diabetes patient has  been controlled with oral hypoglycemics.  She has lost about 15-20 pounds. Plan Continue Januvia. -off her metformin to reduce its anorexic effects. -Patient will monitor blood sugars at home  #8 severe anxiety.  Patient was having insomnia and difficulty with relaxing. Patient appears much improved on Celexa and when necessary lorazepam. Plan -continue on Celexa for long-term management of generalized anxiety. -continue lorazepam when necessary for acute anxiety attacks  RTC in 3 weeks prior to third cycle of carboplatin and etoposide chemotherapy with Dr. Irene Limbo  I spent 25 minutes counseling the patient face to face. The total time spent in the appointment was 30 minutes and more than 50% was on counseling and direct patient cares.    Sullivan Lone MD [place] AAHIVMS [facility] Hematology/Oncology Physician [facility]  (Office):       [phone] (Work cell):  [phone] (Fax):           [phone] 10/20/2014

## 2014-11-10 NOTE — Patient Instructions (Signed)
[facility] Discharge Instructions for Patients Receiving Chemotherapy  Today you received the following chemotherapy agents Etoposide (VP 16) To help prevent nausea and vomiting after your treatment, we encourage you to take your nausea medication as prescribed.If you develop nausea and vomiting that is not controlled by your nausea medication, call the clinic.   BELOW ARE SYMPTOMS THAT SHOULD BE REPORTED IMMEDIATELY:  *FEVER GREATER THAN 100.5 F  *CHILLS WITH OR WITHOUT FEVER  NAUSEA AND VOMITING THAT IS NOT CONTROLLED WITH YOUR NAUSEA MEDICATION  *UNUSUAL SHORTNESS OF BREATH  *UNUSUAL BRUISING OR BLEEDING  TENDERNESS IN MOUTH AND THROAT WITH OR WITHOUT PRESENCE OF ULCERS  *URINARY PROBLEMS  *BOWEL PROBLEMS  UNUSUAL RASH Items with * indicate a potential emergency and should be followed up as soon as possible.  Feel free to call the clinic you have any questions or concerns. The clinic phone number is [phone].  Please show the CHEMO ALERT CARD at check-in to the Emergency Department and triage nurse. 

## 2014-11-10 NOTE — Telephone Encounter (Signed)
pof noted bur next appointments on schedule

## 2014-11-10 NOTE — Progress Notes (Signed)
Pt here for patient teaching.  Pt given Radiation and You booklet, skin care instructions and Biafine. Pt reports they have not watched the Radiation Therapy Education video. Reviewed areas of pertinence such as fatigue, skin changes, throat changes, cough and shortness of breath . Pt able to give teach back of to pat skin, use unscented/gentle soap and drink plenty of water,apply Biafine bid and avoid applying anything to skin within 4 hours of treatment. Pt needs reinforcement of information given and will contact nursing with any questions or concerns.    Pt states they have not watched the Radiation Therapy Education. Family given link for video. [email or web address]

## 2014-11-11 ENCOUNTER — Ambulatory Visit: Payer: Medicare Other

## 2014-11-11 ENCOUNTER — Ambulatory Visit (HOSPITAL_BASED_OUTPATIENT_CLINIC_OR_DEPARTMENT_OTHER): Payer: Medicare Other

## 2014-11-11 ENCOUNTER — Ambulatory Visit
Admission: RE | Admit: 2014-11-11 | Discharge: 2014-11-11 | Disposition: A | Discharge status: Home / Self Care | Payer: Medicare Other | Source: Ambulatory Visit | Attending: Radiation Oncology | Admitting: Radiation Oncology

## 2014-11-11 VITALS — BP 137/57 | HR 79 | Temp 98.0°F | Resp 18

## 2014-11-11 DIAGNOSIS — C781 Secondary malignant neoplasm of mediastinum: Secondary | ICD-10-CM | POA: Diagnosis not present

## 2014-11-11 DIAGNOSIS — C3490 Malignant neoplasm of unspecified part of unspecified bronchus or lung: Secondary | ICD-10-CM

## 2014-11-11 DIAGNOSIS — Z510 Encounter for antineoplastic radiation therapy: Secondary | ICD-10-CM | POA: Diagnosis not present

## 2014-11-11 DIAGNOSIS — Z5111 Encounter for antineoplastic chemotherapy: Secondary | ICD-10-CM | POA: Diagnosis present

## 2014-11-11 MED ORDER — PROCHLORPERAZINE MALEATE 10 MG PO TABS
ORAL_TABLET | ORAL | Status: AC
Start: 2014-11-11 — End: 2014-11-11
  Filled 2014-11-11: qty 1

## 2014-11-11 MED ORDER — PROCHLORPERAZINE MALEATE 10 MG PO TABS
10.0000 mg | ORAL_TABLET | Freq: Once | ORAL | Status: AC
  Administered 2014-11-11: 10 mg via ORAL

## 2014-11-11 MED ORDER — SODIUM CHLORIDE 0.9 % IV SOLN
Freq: Once | INTRAVENOUS | Status: AC
  Administered 2014-11-11: 10:00:00 via INTRAVENOUS

## 2014-11-11 MED ORDER — HEPARIN SOD (PORK) LOCK FLUSH 100 UNIT/ML IV SOLN
500.0000 [IU] | Freq: Once | INTRAVENOUS | Status: AC | PRN
  Administered 2014-11-11: 500 [IU]
  Filled 2014-11-11: qty 5

## 2014-11-11 MED ORDER — SODIUM CHLORIDE 0.9 % IJ SOLN
10.0000 mL | INTRAMUSCULAR | Status: DC | PRN
  Administered 2014-11-11: 10 mL
  Filled 2014-11-11: qty 10

## 2014-11-11 MED ORDER — SODIUM CHLORIDE 0.9 % IV SOLN
100.0000 mg/m2 | Freq: Once | INTRAVENOUS | Status: AC
  Administered 2014-11-11: 170 mg via INTRAVENOUS
  Filled 2014-11-11: qty 8.5

## 2014-11-11 NOTE — Patient Instructions (Signed)
[facility] Discharge Instructions for Patients Receiving Chemotherapy  Today you received the following chemotherapy agents Etoposide (VP 16) To help prevent nausea and vomiting after your treatment, we encourage you to take your nausea medication as prescribed.If you develop nausea and vomiting that is not controlled by your nausea medication, call the clinic.   BELOW ARE SYMPTOMS THAT SHOULD BE REPORTED IMMEDIATELY:  *FEVER GREATER THAN 100.5 F  *CHILLS WITH OR WITHOUT FEVER  NAUSEA AND VOMITING THAT IS NOT CONTROLLED WITH YOUR NAUSEA MEDICATION  *UNUSUAL SHORTNESS OF BREATH  *UNUSUAL BRUISING OR BLEEDING  TENDERNESS IN MOUTH AND THROAT WITH OR WITHOUT PRESENCE OF ULCERS  *URINARY PROBLEMS  *BOWEL PROBLEMS  UNUSUAL RASH Items with * indicate a potential emergency and should be followed up as soon as possible.  Feel free to call the clinic you have any questions or concerns. The clinic phone number is [phone].  Please show the CHEMO ALERT CARD at check-in to the Emergency Department and triage nurse. 

## 2014-11-14 ENCOUNTER — Ambulatory Visit
Admission: RE | Admit: 2014-11-14 | Discharge: 2014-11-14 | Disposition: A | Discharge status: Home / Self Care | Payer: Medicare Other | Source: Ambulatory Visit | Attending: Radiation Oncology | Admitting: Radiation Oncology

## 2014-11-14 ENCOUNTER — Ambulatory Visit: Admission: RE | Admit: 2014-11-14 | Payer: Medicare Other | Source: Ambulatory Visit | Admitting: Radiation Oncology

## 2014-11-14 ENCOUNTER — Ambulatory Visit: Payer: Medicare Other

## 2014-11-14 VITALS — BP 141/64 | HR 74 | Temp 97.5°F | Ht 63.0 in | Wt 142.0 lb

## 2014-11-14 DIAGNOSIS — Z510 Encounter for antineoplastic radiation therapy: Secondary | ICD-10-CM | POA: Diagnosis present

## 2014-11-14 DIAGNOSIS — F17210 Nicotine dependence, cigarettes, uncomplicated: Secondary | ICD-10-CM | POA: Diagnosis not present

## 2014-11-14 DIAGNOSIS — C342 Malignant neoplasm of middle lobe, bronchus or lung: Secondary | ICD-10-CM | POA: Diagnosis present

## 2014-11-14 NOTE — Progress Notes (Signed)
   Weekly Management Note:  Outpatient    ICD-9-CM ICD-10-CM   1. 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  CBCT/MVCT images/Port film x-rays were reviewed.  The chart was checked.  No new complaints.  Physical Findings:  Wt Readings from Last 3 Encounters:  11/14/14 142 lb (64.411 kg)  11/09/14 141 lb 6.4 oz (64.139 kg)  11/07/14 142 lb 8 oz (64.638 kg)    height is '5\' 3"'$  (1.6 m) and weight is 142 lb (64.411 kg). Her temperature is 97.5 F (36.4 C). Her blood pressure is 141/64 and her pulse is 74. Her oxygen saturation is 100%.  NAD, early alopecia, ambulatory  CBC    Component Value Date/Time   WBC 6.7 11/09/2014 1019   WBC 11.2* 10/21/2014 0755   RBC 3.20* 11/09/2014 1019   RBC 4.24 10/21/2014 0755   HGB 9.7* 11/09/2014 1019   HGB 12.9 10/21/2014 0755   HCT 28.3* 11/09/2014 1019   HCT 36.9 10/21/2014 0755   PLT 303 11/09/2014 1019   PLT 296 10/21/2014 0755   MCV 88.4 11/09/2014 1019   MCV 87.0 10/21/2014 0755   MCH 30.3 11/09/2014 1019   MCH 30.4 10/21/2014 0755   MCHC 34.3 11/09/2014 1019   MCHC 35.0 10/21/2014 0755   RDW 15.0* 11/09/2014 1019   RDW 14.4 10/21/2014 0755   LYMPHSABS 1.2 11/09/2014 1019   LYMPHSABS 1.4 10/21/2014 0755   MONOABS 1.2* 11/09/2014 1019   MONOABS 0.7 10/21/2014 0755   EOSABS 0.1 11/09/2014 1019   EOSABS 0.2 10/21/2014 0755   BASOSABS 0.0 11/09/2014 1019   BASOSABS 0.0 10/21/2014 0755     CMP     Component Value Date/Time   NA 134* 11/09/2014 1020   NA 128* 10/21/2014 0755   K 4.6 11/09/2014 1020   K 4.3 10/21/2014 0755   CL 94* 10/21/2014 0755   CO2 25 11/09/2014 1020   CO2 26 10/21/2014 0755   GLUCOSE 211* 11/09/2014 1020   GLUCOSE 174* 10/21/2014 0755   BUN 22.3 11/09/2014 1020   BUN 22* 10/21/2014 0755   CREATININE 1.1 11/09/2014 1020   CREATININE 0.93 10/21/2014 0755   CALCIUM 9.2  11/09/2014 1020   CALCIUM 9.5 10/21/2014 0755   PROT 6.2* 11/09/2014 1020   ALBUMIN 3.5 11/09/2014 1020   AST 12 11/09/2014 1020   ALT 22 11/09/2014 1020   ALKPHOS 66 11/09/2014 1020   BILITOT <0.30 11/09/2014 1020   GFRNONAA 58* 10/21/2014 0755   GFRAA >60 10/21/2014 0755     Impression:  The patient is tolerating radiotherapy.   Plan:  Continue radiotherapy as planned. Ativan PRN anxiety/claustrophia. Recommended limiting caffeine intake and staying hydrated. -----------------------------------  [name], MD

## 2014-11-14 NOTE — Progress Notes (Signed)
Ms. Rachel Chapman presents for her 6th fraction to her chest. She denies pain, says she is eating well but says she could drink more. She feels mild fatigue and admits to taking naps at times. She has no skin issues, and I encouraged the use of the biafine as needed.  BP 141/64 mmHg  Pulse 74  Temp(Src) 97.5 F (36.4 C)  Ht '5\' 3"'$  (1.6 m)  Wt 142 lb (64.411 kg)  BMI 25.16 kg/m2  SpO2 100%  Wt Readings from Last 3 Encounters:  11/14/14 142 lb (64.411 kg)  11/09/14 141 lb 6.4 oz (64.139 kg)  11/07/14 142 lb 8 oz (64.638 kg)

## 2014-11-15 ENCOUNTER — Encounter: Payer: Self-pay | Admitting: Adult Health

## 2014-11-15 ENCOUNTER — Telehealth: Payer: Self-pay | Admitting: Adult Health

## 2014-11-15 ENCOUNTER — Ambulatory Visit: Payer: Medicare Other

## 2014-11-15 ENCOUNTER — Ambulatory Visit
Admission: RE | Admit: 2014-11-15 | Discharge: 2014-11-15 | Disposition: A | Discharge status: Home / Self Care | Payer: Medicare Other | Source: Ambulatory Visit | Attending: Radiation Oncology | Admitting: Radiation Oncology

## 2014-11-15 ENCOUNTER — Ambulatory Visit (INDEPENDENT_AMBULATORY_CARE_PROVIDER_SITE_OTHER): Payer: Medicare Other | Admitting: Adult Health

## 2014-11-15 VITALS — BP 110/76 | Temp 98.4°F | Ht 63.0 in | Wt 143.8 lb

## 2014-11-15 DIAGNOSIS — E039 Hypothyroidism, unspecified: Secondary | ICD-10-CM | POA: Diagnosis not present

## 2014-11-15 DIAGNOSIS — E785 Hyperlipidemia, unspecified: Secondary | ICD-10-CM

## 2014-11-15 DIAGNOSIS — H9193 Unspecified hearing loss, bilateral: Secondary | ICD-10-CM

## 2014-11-15 DIAGNOSIS — Z0000 Encounter for general adult medical examination without abnormal findings: Secondary | ICD-10-CM

## 2014-11-15 DIAGNOSIS — Z23 Encounter for immunization: Secondary | ICD-10-CM | POA: Diagnosis not present

## 2014-11-15 DIAGNOSIS — E119 Type 2 diabetes mellitus without complications: Secondary | ICD-10-CM

## 2014-11-15 DIAGNOSIS — I10 Essential (primary) hypertension: Secondary | ICD-10-CM

## 2014-11-15 DIAGNOSIS — Z510 Encounter for antineoplastic radiation therapy: Secondary | ICD-10-CM | POA: Diagnosis not present

## 2014-11-15 LAB — HEPATIC FUNCTION PANEL
ALT: 15 U/L (ref 0–35)
AST: 9 U/L (ref 0–37)
Albumin: 3.7 g/dL (ref 3.5–5.2)
Alkaline Phosphatase: 57 U/L (ref 39–117)
BILIRUBIN DIRECT: 0.1 mg/dL (ref 0.0–0.3)
BILIRUBIN TOTAL: 0.4 mg/dL (ref 0.2–1.2)
Total Protein: 6 g/dL (ref 6.0–8.3)

## 2014-11-15 LAB — HEMOGLOBIN A1C: HEMOGLOBIN A1C: 7.4 % — AB (ref 4.6–6.5)

## 2014-11-15 LAB — LIPID PANEL
CHOL/HDL RATIO: 3
Cholesterol: 143 mg/dL (ref 0–200)
HDL: 46.4 mg/dL (ref >39.00)
LDL CALC: 71 mg/dL (ref 0–99)
NONHDL: 96.82
Triglycerides: 129 mg/dL (ref 0.0–149.0)
VLDL: 25.8 mg/dL (ref 0.0–40.0)

## 2014-11-15 LAB — TSH: TSH: 0.98 u[IU]/mL (ref 0.35–4.50)

## 2014-11-15 MED ORDER — ONETOUCH DELICA LANCETS FINE MISC: Status: DC

## 2014-11-15 MED ORDER — GLUCOSE BLOOD VI STRP: ORAL_STRIP | Status: DC

## 2014-11-15 NOTE — Progress Notes (Signed)
Subjective:   Patient presents today for their annual wellness visit. She is a pleasant caucasian female who  has a past medical history of Hypertension; Dyslipidemia; Diabetes ([place]); Hypothyroidism; Pneumonia (2007); Smoker; Anxiety; Cancer [facility]); and Lung cancer ([place]) (8/216).   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 She has also begun radiation therapy and is in her second week of that, she has a total of 7 weeks. She denies any issues with treatment besides her hair falling out.   She was seen in the ER in August for Dizziness.  Medicare questionnaire was completed  All immunizations and health maintenance protocols were reviewed with the patient.   Medication reconciliation,  past medical history, social history, problem list and allergies were reviewed in detail with the patient  Goals were established with regard to weight loss, exercise, and diet in compliance with medications   Preventive Screening-Counseling & Management  Smoking Status: Former Smoker [employer] Smoking status: No smokers in home  Risk Factors Regular exercise:  Diet: She is gaining weight and feels as though she has a good appetite.  Fall Risk: Yes   Cardiac risk factors:  advanced age (older than 35 for men, 23 for women)  Hyperlipidemia  Diabetes.  Family History:Diabetes  Depression Screen None. PHQ2 0   Activities of Daily Living  Independent ADLs and IADLs   Hearing Difficulties: patient declines  Cognitive Testing No reported trouble.   Normal 3 word recall  List the Names of Other Physician/Practitioners you currently use: 1.Oncology - Dr. Irene Limbo 2. Radiation Oncologist - Dr. Isidore Moos    There is no immunization history on file for this patient. Required Immunizations needed today Flu and PNA. Patient would only like PNA today  Screening tests- up to date Health Maintenance Due    Topic Date Due  . HEMOGLOBIN A1C  1940/05/28  . FOOT EXAM  03/25/1948  . OPHTHALMOLOGY EXAM  03/25/1948  . URINE MICROALBUMIN  03/25/1948  . TETANUS/TDAP  03/25/1957  . ZOSTAVAX  03/25/1998  . DEXA SCAN  03/26/2003  . PNA vac Low Risk Adult (1 of 2 - PCV13) 03/26/2003  . INFLUENZA VACCINE  09/12/2014    ROS- No pertinent positives discovered in course of AWV  The following were reviewed and entered/updated in epic: Past Medical History  Diagnosis Date  . Hypertension   . Dyslipidemia   . Diabetes   . Hypothyroidism   . Pneumonia 2007  . Smoker     1.5 packs per day for 60 years.  Quit one month ago  . Anxiety   . Cancer   . Lung cancer 8/216    hilar/mediastinum lung scca   Patient Active Problem List   Diagnosis Date Noted  . Essential hypertension 11/01/2014  . Diabetes type 2, controlled ([place]) 11/01/2014  . Hypothyroidism 11/01/2014  . Hyperlipidemia 11/01/2014  . Generalized anxiety disorder 11/01/2014  . Cancer of middle lobe of lung ([place]) 10/26/2014  . Small cell lung cancer ([place]) 10/13/2014   Past Surgical History  Procedure Laterality Date  . Cesarean section      Family History  Problem Relation Age of Onset  . Clotting disorder Neg Hx   . Cancer Mother     lung non smoker  . Cancer Sister     colon  . Diabetes Sister   . Diabetes Mother     Medications- reviewed and  updated Current Outpatient Prescriptions  Medication Sig Dispense Refill  . albuterol (PROVENTIL HFA;VENTOLIN HFA) 108 (90 BASE) MCG/ACT inhaler Inhale 2 puffs into the lungs every 6 (six) hours as needed for shortness of breath. 3.7 g 3  . cholecalciferol (VITAMIN D) 1000 UNITS tablet Take 1 tablet (1,000 Units total) by mouth daily. 90 tablet 3  . citalopram (CELEXA) 10 MG tablet Take 1 tablet (10 mg total) by mouth daily. 30 tablet 3  . emollient (BIAFINE) cream Apply topically as needed. 454 g 0  . levothyroxine (SYNTHROID, LEVOTHROID) 112 MCG tablet Take 1 tablet (112 mcg  total) by mouth 2 (two) times daily. 90 tablet 3  . lidocaine-prilocaine (EMLA) cream Apply topically once. 30 each 2  . lisinopril (PRINIVIL,ZESTRIL) 10 MG tablet Take 1 tablet (10 mg total) by mouth daily. 90 tablet 1  . LORazepam (ATIVAN) 0.5 MG tablet Take 1 tablet (0.5 mg total) by mouth every 8 (eight) hours as needed for anxiety (or nausea). Could take 2 tab 30-45 mins prior to PET/CT and MRI Brain 60 tablet 0  . magic mouthwash w/lidocaine SOLN Take 5 mLs by mouth 4 (four) times daily as needed for mouth pain. 200 mL 0  . ondansetron (ZOFRAN-ODT) 4 MG disintegrating tablet Take 4 mg by mouth every 8 (eight) hours as needed for nausea or vomiting.    . senna-docusate (SENNA S) 8.6-50 MG tablet Take 2 tablets by mouth at bedtime as needed for mild constipation. (Patient not taking: Reported on 11/14/2014) 60 tablet 1  . simvastatin (ZOCOR) 20 MG tablet Take 1 tablet (20 mg total) by mouth at bedtime. 90 tablet 3  . sitaGLIPtin (JANUVIA) 100 MG tablet Take 1 tablet (100 mg total) by mouth daily. 30 tablet 3  . vitamin B-12 (CYANOCOBALAMIN) 1000 MCG tablet Take 1 tablet (1,000 mcg total) by mouth daily. 90 tablet 3   No current facility-administered medications for this visit.   Facility-Administered Medications Ordered in Other Visits  Medication Dose Route Frequency Provider Last Rate Last Dose  . sodium chloride 0.9 % injection 10 mL  10 mL Intracatheter PRN [name], MD   10 mL at 11/11/14 1131    Allergies-reviewed and updated No Known Allergies  Social History   Social History  . Marital Status: Widowed    Spouse Name: N/A  . Number of Children: N/A  . Years of Education: N/A   Social History Main Topics  . Smoking status: Former Smoker -- 1.50 packs/day for 60 years    Quit date: 09/12/2014  . Smokeless tobacco: Not on file  . Alcohol Use: No  . Drug Use: No  . Sexual Activity: No   Other Topics Concern  . Not on file   Social History Narrative   She has  moved from [place]   She is retired from working with special education    Was married widowed twice.           Objective: There were no vitals taken for this visit. GENERAL: vitals reviewed and listed above, alert, oriented, appears well hydrated and in no acute distress. She appears with added weight and less tired than her previous visit  HEENT: atraumatic, conjunttiva clear, no obvious abnormalities on inspection of external nose and ears. Alopecia   NECK: Neck is soft and supple without masses, no adenopathy or thyromegaly, trachea midline, no JVD. Normal range of motion.   LUNGS: clear to auscultation bilaterally, no wheezes, rales or rhonchi, good air movement  CV: Regular  rate and rhythm, normal S1/S2, no audible murmurs, gallops, or rubs. No carotid bruit and no peripheral edema.   MS: moves all extremities without noticeable abnormality. No edema noted  Skin: warm and dry, no rash . Has port in right chest wall.   Extremities: No clubbing, cyanosis, or edema. Capillary refill is WNL. Pulses intact bilaterally in upper and lower extremities.   Neuro: CN II-XII intact, sensation and reflexes normal throughout, 5/5 muscle strength in bilateral upper and lower extremities. Normal finger to nose. Normal rapid alternating movements. Normal romberg. No pronator drift.   PSYCH: pleasant and cooperative, no obvious depression or anxiety  Assessment/Plan: 1. Medicare annual wellness visit, subsequent - Continue to add weight with high quality proteins - Rest  - Be as active as possible.   2. Essential hypertension - No change in medications - Lipid panel - Hepatic function panel - Hemoglobin A1c  3. Controlled type 2 diabetes mellitus without complication, without long-term current use of insulin ([place]) - Continue with not taking Metformin in order to help with weight gain.  - Lipid panel - Hepatic function panel - Hemoglobin V6P - ONETOUCH DELICA LANCETS FINE MISC; Test once  daily.  Dispense: 100 each; Refill: 5 - glucose blood (ONETOUCH VERIO) test strip; Test once daily.  Dispense: 100 each; Refill: 5  4. Hypothyroidism, unspecified hypothyroidism type - TSH - Consider change in Synthroid 5. Hyperlipidemia - Lipid panel - Hepatic function panel  6. Encounter for immunization - PNA vaccination given   Return precautions advised.

## 2014-11-15 NOTE — Progress Notes (Signed)
Pre visit review using our clinic review tool, if applicable. No additional management support is needed unless otherwise documented below in the visit note. 

## 2014-11-15 NOTE — Patient Instructions (Signed)
It was great seeing you again!  I will follow up with you on your blood work.  Make an appointment to follow up with before the end of the year/.   You are doing great!!!  Please let me know if you need anything.

## 2014-11-15 NOTE — Telephone Encounter (Signed)
Pt request refills which were already sent

## 2014-11-16 ENCOUNTER — Ambulatory Visit
Admission: RE | Admit: 2014-11-16 | Discharge: 2014-11-16 | Disposition: A | Discharge status: Home / Self Care | Payer: Medicare Other | Source: Ambulatory Visit | Attending: Radiation Oncology | Admitting: Radiation Oncology

## 2014-11-16 ENCOUNTER — Ambulatory Visit: Payer: Medicare Other

## 2014-11-16 DIAGNOSIS — Z510 Encounter for antineoplastic radiation therapy: Secondary | ICD-10-CM | POA: Diagnosis not present

## 2014-11-17 ENCOUNTER — Ambulatory Visit
Admission: RE | Admit: 2014-11-17 | Discharge: 2014-11-17 | Disposition: A | Discharge status: Home / Self Care | Payer: Medicare Other | Source: Ambulatory Visit | Attending: Radiation Oncology | Admitting: Radiation Oncology

## 2014-11-17 ENCOUNTER — Ambulatory Visit: Payer: Medicare Other

## 2014-11-17 DIAGNOSIS — Z510 Encounter for antineoplastic radiation therapy: Secondary | ICD-10-CM | POA: Diagnosis not present

## 2014-11-18 ENCOUNTER — Ambulatory Visit: Payer: Medicare Other

## 2014-11-18 ENCOUNTER — Ambulatory Visit
Admission: RE | Admit: 2014-11-18 | Discharge: 2014-11-18 | Disposition: A | Discharge status: Home / Self Care | Payer: Medicare Other | Source: Ambulatory Visit | Attending: Radiation Oncology | Admitting: Radiation Oncology

## 2014-11-18 DIAGNOSIS — Z510 Encounter for antineoplastic radiation therapy: Secondary | ICD-10-CM | POA: Diagnosis not present

## 2014-11-21 ENCOUNTER — Ambulatory Visit
Admission: RE | Admit: 2014-11-21 | Discharge: 2014-11-21 | Disposition: A | Discharge status: Home / Self Care | Payer: Medicare Other | Source: Ambulatory Visit | Attending: Radiation Oncology | Admitting: Radiation Oncology

## 2014-11-21 ENCOUNTER — Encounter: Payer: Self-pay | Admitting: Radiation Oncology

## 2014-11-21 ENCOUNTER — Ambulatory Visit: Payer: Medicare Other

## 2014-11-21 VITALS — BP 103/56 | HR 88 | Temp 97.4°F | Ht 63.0 in | Wt 145.7 lb

## 2014-11-21 DIAGNOSIS — C342 Malignant neoplasm of middle lobe, bronchus or lung: Secondary | ICD-10-CM

## 2014-11-21 DIAGNOSIS — Z510 Encounter for antineoplastic radiation therapy: Secondary | ICD-10-CM | POA: Diagnosis not present

## 2014-11-21 MED ORDER — SUCRALFATE 1 G PO TABS: ORAL_TABLET | ORAL | Status: DC

## 2014-11-21 NOTE — Progress Notes (Signed)
   Weekly Management Note:  Outpatient    ICD-9-CM ICD-10-CM   1. 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  CBCT/MVCT images/Port film x-rays were reviewed.  The chart was checked.  No new complaints but she is drinking "not much" and somewhat dizzy when standing after RT .Small catch in throat, no pain.  Physical Findings:  Wt Readings from Last 3 Encounters:  11/21/14 145 lb 11.2 oz (66.089 kg)  11/15/14 143 lb 12.8 oz (65.227 kg)  11/14/14 142 lb (64.411 kg)    height is '5\' 3"'$  (1.6 m) and weight is 145 lb 11.2 oz (66.089 kg). Her temperature is 97.4 F (36.3 C). Her blood pressure is 103/56 and her pulse is 88. Her oxygen saturation is 100%.  NAD, early alopecia, ambulatory  CBC    Component Value Date/Time   WBC 6.7 11/09/2014 1019   WBC 11.2* 10/21/2014 0755   RBC 3.20* 11/09/2014 1019   RBC 4.24 10/21/2014 0755   HGB 9.7* 11/09/2014 1019   HGB 12.9 10/21/2014 0755   HCT 28.3* 11/09/2014 1019   HCT 36.9 10/21/2014 0755   PLT 303 11/09/2014 1019   PLT 296 10/21/2014 0755   MCV 88.4 11/09/2014 1019   MCV 87.0 10/21/2014 0755   MCH 30.3 11/09/2014 1019   MCH 30.4 10/21/2014 0755   MCHC 34.3 11/09/2014 1019   MCHC 35.0 10/21/2014 0755   RDW 15.0* 11/09/2014 1019   RDW 14.4 10/21/2014 0755   LYMPHSABS 1.2 11/09/2014 1019   LYMPHSABS 1.4 10/21/2014 0755   MONOABS 1.2* 11/09/2014 1019   MONOABS 0.7 10/21/2014 0755   EOSABS 0.1 11/09/2014 1019   EOSABS 0.2 10/21/2014 0755   BASOSABS 0.0 11/09/2014 1019   BASOSABS 0.0 10/21/2014 0755     CMP     Component Value Date/Time   NA 134* 11/09/2014 1020   NA 128* 10/21/2014 0755   K 4.6 11/09/2014 1020   K 4.3 10/21/2014 0755   CL 94* 10/21/2014 0755   CO2 25 11/09/2014 1020   CO2 26 10/21/2014 0755   GLUCOSE 211* 11/09/2014 1020   GLUCOSE 174* 10/21/2014 0755   BUN 22.3 11/09/2014 1020    BUN 22* 10/21/2014 0755   CREATININE 1.1 11/09/2014 1020   CREATININE 0.93 10/21/2014 0755   CALCIUM 9.2 11/09/2014 1020   CALCIUM 9.5 10/21/2014 0755   PROT 6.0 11/15/2014 0858   PROT 6.2* 11/09/2014 1020   ALBUMIN 3.7 11/15/2014 0858   ALBUMIN 3.5 11/09/2014 1020   AST 9 11/15/2014 0858   AST 12 11/09/2014 1020   ALT 15 11/15/2014 0858   ALT 22 11/09/2014 1020   ALKPHOS 57 11/15/2014 0858   ALKPHOS 66 11/09/2014 1020   BILITOT 0.4 11/15/2014 0858   BILITOT <0.30 11/09/2014 1020   GFRNONAA 58* 10/21/2014 0755   GFRAA >60 10/21/2014 0755     Impression:  The patient is tolerating radiotherapy.   Plan:  Continue radiotherapy as planned. Urged to push PO intake (fluids particularly) and hold BP meds; pt reports she was recently switched to lisinopril alone.  I will let her PCP Beaulah Dinning know of this recommendation in light of her vitals today.  Patient will have labs and vitals checked tomorrow - if not satisfactory, we may order IV fluids.  Sucralfate for early esophagitis symptoms.   -----------------------------------  [name], MD

## 2014-11-21 NOTE — Progress Notes (Signed)
Rachel Chapman presents for her 11th treatment of radiation to her lung. She reports some fatigue, but is still able to complete her normal activities. She denies any pain at this time. She states she is eating well, but reports she is not drinking as much as she should, but her daughter states they are "working on it". She is orthostatic today with a sitting BP of 103/56 and pulse 88, and her standing BP is 76/37 and pulse 87. She does report some dizziness when standing, especially after radiation treatments.  BP 103/56 mmHg  Pulse 88  Temp(Src) 97.4 F (36.3 C)  Ht '5\' 3"'$  (1.6 m)  Wt 145 lb 11.2 oz (66.089 kg)  BMI 25.82 kg/m2  SpO2 100% Wt Readings from Last 3 Encounters:  11/21/14 145 lb 11.2 oz (66.089 kg)  11/15/14 143 lb 12.8 oz (65.227 kg)  11/14/14 142 lb (64.411 kg)

## 2014-11-22 ENCOUNTER — Ambulatory Visit: Payer: Medicare Other

## 2014-11-22 ENCOUNTER — Telehealth: Payer: Self-pay

## 2014-11-22 ENCOUNTER — Ambulatory Visit
Admission: RE | Admit: 2014-11-22 | Discharge: 2014-11-22 | Disposition: A | Discharge status: Home / Self Care | Payer: Medicare Other | Source: Ambulatory Visit | Attending: Radiation Oncology | Admitting: Radiation Oncology

## 2014-11-22 ENCOUNTER — Telehealth: Payer: Self-pay | Admitting: *Deleted

## 2014-11-22 DIAGNOSIS — C342 Malignant neoplasm of middle lobe, bronchus or lung: Secondary | ICD-10-CM

## 2014-11-22 DIAGNOSIS — Z510 Encounter for antineoplastic radiation therapy: Secondary | ICD-10-CM | POA: Diagnosis not present

## 2014-11-22 LAB — BASIC METABOLIC PANEL (CC13)
ANION GAP: 7 meq/L (ref 3–11)
BUN: 21.6 mg/dL (ref 7.0–26.0)
CALCIUM: 9 mg/dL (ref 8.4–10.4)
CO2: 25 mEq/L (ref 22–29)
Chloride: 105 mEq/L (ref 98–109)
Creatinine: 1 mg/dL (ref 0.6–1.1)
EGFR: 57 mL/min/{1.73_m2} — ABNORMAL LOW (ref >90)
GLUCOSE: 242 mg/dL — AB (ref 70–140)
Potassium: 4.8 mEq/L (ref 3.5–5.1)
Sodium: 137 mEq/L (ref 136–145)

## 2014-11-22 NOTE — Progress Notes (Signed)
Rachel Chapman left today without seeing the doctor, due to a prolonged wait at the time.  She was aware that she might need IVF due to her orthostatics, but stated she was not interested in receiving IVF at this time regardless of what the MD recommended. She stated she would go home and drink lots of fluids. I have let Dr. Isidore Moos know this via email.

## 2014-11-22 NOTE — Telephone Encounter (Signed)
Told Heather in the pharmacy that the ratio of the ingredients can be 1:1.

## 2014-11-22 NOTE — Telephone Encounter (Signed)
Called patient to ask about coming for labs today, patient will be here @ 1 pm, spoke with patient and she agreed to this.

## 2014-11-22 NOTE — Progress Notes (Signed)
Rachel Chapman is here to check her orthostatics after treatment today. Her sitting BP is  126/63, and pulse is 90. Her standing BP is 82/35 and pulse 88. Dr. Valere Dross has been paged as Dr. Isidore Moos is not in the office today.

## 2014-11-23 ENCOUNTER — Other Ambulatory Visit: Payer: Self-pay | Admitting: *Deleted

## 2014-11-23 ENCOUNTER — Ambulatory Visit: Payer: Medicare Other

## 2014-11-23 ENCOUNTER — Ambulatory Visit
Admission: RE | Admit: 2014-11-23 | Discharge: 2014-11-23 | Disposition: A | Discharge status: Home / Self Care | Payer: Medicare Other | Source: Ambulatory Visit | Attending: Radiation Oncology | Admitting: Radiation Oncology

## 2014-11-23 DIAGNOSIS — Z510 Encounter for antineoplastic radiation therapy: Secondary | ICD-10-CM | POA: Diagnosis not present

## 2014-11-24 ENCOUNTER — Ambulatory Visit: Payer: Medicare Other

## 2014-11-24 ENCOUNTER — Ambulatory Visit
Admission: RE | Admit: 2014-11-24 | Discharge: 2014-11-24 | Disposition: A | Discharge status: Home / Self Care | Payer: Medicare Other | Source: Ambulatory Visit | Attending: Radiation Oncology | Admitting: Radiation Oncology

## 2014-11-24 DIAGNOSIS — Z510 Encounter for antineoplastic radiation therapy: Secondary | ICD-10-CM | POA: Diagnosis not present

## 2014-11-25 ENCOUNTER — Ambulatory Visit: Payer: Medicare Other

## 2014-11-25 ENCOUNTER — Emergency Department (HOSPITAL_COMMUNITY)
Admission: EM | Admit: 2014-11-25 | Discharge: 2014-11-25 | Disposition: A | Discharge status: Home / Self Care | Payer: Medicare Other | Attending: Emergency Medicine | Admitting: Emergency Medicine

## 2014-11-25 ENCOUNTER — Ambulatory Visit
Admission: RE | Admit: 2014-11-25 | Discharge: 2014-11-25 | Disposition: A | Discharge status: Home / Self Care | Payer: Medicare Other | Source: Ambulatory Visit | Attending: Radiation Oncology | Admitting: Radiation Oncology

## 2014-11-25 ENCOUNTER — Encounter: Payer: Self-pay | Admitting: General Practice

## 2014-11-25 ENCOUNTER — Encounter (HOSPITAL_COMMUNITY): Payer: Self-pay

## 2014-11-25 DIAGNOSIS — R42 Dizziness and giddiness: Secondary | ICD-10-CM | POA: Diagnosis not present

## 2014-11-25 DIAGNOSIS — Z8701 Personal history of pneumonia (recurrent): Secondary | ICD-10-CM | POA: Diagnosis not present

## 2014-11-25 DIAGNOSIS — Z87891 Personal history of nicotine dependence: Secondary | ICD-10-CM | POA: Diagnosis not present

## 2014-11-25 DIAGNOSIS — E860 Dehydration: Secondary | ICD-10-CM | POA: Diagnosis not present

## 2014-11-25 DIAGNOSIS — T451X5A Adverse effect of antineoplastic and immunosuppressive drugs, initial encounter: Secondary | ICD-10-CM

## 2014-11-25 DIAGNOSIS — R000 Tachycardia, unspecified: Secondary | ICD-10-CM | POA: Insufficient documentation

## 2014-11-25 DIAGNOSIS — R079 Chest pain, unspecified: Secondary | ICD-10-CM | POA: Diagnosis not present

## 2014-11-25 DIAGNOSIS — R112 Nausea with vomiting, unspecified: Secondary | ICD-10-CM | POA: Diagnosis not present

## 2014-11-25 DIAGNOSIS — I10 Essential (primary) hypertension: Secondary | ICD-10-CM | POA: Insufficient documentation

## 2014-11-25 DIAGNOSIS — I951 Orthostatic hypotension: Secondary | ICD-10-CM

## 2014-11-25 DIAGNOSIS — R5383 Other fatigue: Secondary | ICD-10-CM | POA: Insufficient documentation

## 2014-11-25 DIAGNOSIS — Z79899 Other long term (current) drug therapy: Secondary | ICD-10-CM | POA: Diagnosis not present

## 2014-11-25 DIAGNOSIS — E785 Hyperlipidemia, unspecified: Secondary | ICD-10-CM | POA: Insufficient documentation

## 2014-11-25 DIAGNOSIS — E039 Hypothyroidism, unspecified: Secondary | ICD-10-CM | POA: Diagnosis not present

## 2014-11-25 DIAGNOSIS — E119 Type 2 diabetes mellitus without complications: Secondary | ICD-10-CM | POA: Insufficient documentation

## 2014-11-25 DIAGNOSIS — D61810 Antineoplastic chemotherapy induced pancytopenia: Secondary | ICD-10-CM | POA: Diagnosis not present

## 2014-11-25 DIAGNOSIS — Z510 Encounter for antineoplastic radiation therapy: Secondary | ICD-10-CM | POA: Diagnosis not present

## 2014-11-25 DIAGNOSIS — D701 Agranulocytosis secondary to cancer chemotherapy: Secondary | ICD-10-CM

## 2014-11-25 DIAGNOSIS — F419 Anxiety disorder, unspecified: Secondary | ICD-10-CM | POA: Diagnosis not present

## 2014-11-25 DIAGNOSIS — Z85118 Personal history of other malignant neoplasm of bronchus and lung: Secondary | ICD-10-CM | POA: Diagnosis not present

## 2014-11-25 DIAGNOSIS — R531 Weakness: Secondary | ICD-10-CM | POA: Diagnosis present

## 2014-11-25 LAB — CBC WITH DIFFERENTIAL/PLATELET
BASOS ABS: 0 10*3/uL (ref 0.0–0.1)
Basophils Relative: 1 %
EOS ABS: 0.1 10*3/uL (ref 0.0–0.7)
Eosinophils Relative: 3 %
HEMATOCRIT: 21.2 % — AB (ref 36.0–46.0)
HEMOGLOBIN: 7.3 g/dL — AB (ref 12.0–15.0)
LYMPHS PCT: 40 %
Lymphs Abs: 0.6 10*3/uL — ABNORMAL LOW (ref 0.7–4.0)
MCH: 30.5 pg (ref 26.0–34.0)
MCHC: 34.4 g/dL (ref 30.0–36.0)
MCV: 88.7 fL (ref 78.0–100.0)
MONOS PCT: 39 %
Monocytes Absolute: 0.7 10*3/uL (ref 0.1–1.0)
Neutro Abs: 0.3 10*3/uL — ABNORMAL LOW (ref 1.7–7.7)
Neutrophils Relative %: 17 %
Platelets: 105 10*3/uL — ABNORMAL LOW (ref 150–400)
RBC: 2.39 MIL/uL — AB (ref 3.87–5.11)
RDW: 15.5 % (ref 11.5–15.5)
WBC: 1.7 10*3/uL — AB (ref 4.0–10.5)

## 2014-11-25 LAB — COMPREHENSIVE METABOLIC PANEL
ALT: 29 U/L (ref 14–54)
ANION GAP: 7 (ref 5–15)
AST: 21 U/L (ref 15–41)
Albumin: 3.4 g/dL — ABNORMAL LOW (ref 3.5–5.0)
Alkaline Phosphatase: 63 U/L (ref 38–126)
BILIRUBIN TOTAL: 0.4 mg/dL (ref 0.3–1.2)
BUN: 19 mg/dL (ref 6–20)
CO2: 26 mmol/L (ref 22–32)
Calcium: 8.6 mg/dL — ABNORMAL LOW (ref 8.9–10.3)
Chloride: 100 mmol/L — ABNORMAL LOW (ref 101–111)
Creatinine, Ser: 1.25 mg/dL — ABNORMAL HIGH (ref 0.44–1.00)
GFR, EST AFRICAN AMERICAN: 47 mL/min — AB (ref >60)
GFR, EST NON AFRICAN AMERICAN: 41 mL/min — AB (ref >60)
Glucose, Bld: 233 mg/dL — ABNORMAL HIGH (ref 65–99)
POTASSIUM: 3.9 mmol/L (ref 3.5–5.1)
Sodium: 133 mmol/L — ABNORMAL LOW (ref 135–145)
TOTAL PROTEIN: 6.7 g/dL (ref 6.5–8.1)

## 2014-11-25 LAB — URINALYSIS, ROUTINE W REFLEX MICROSCOPIC
BILIRUBIN URINE: NEGATIVE
Glucose, UA: NEGATIVE mg/dL
Hgb urine dipstick: NEGATIVE
Ketones, ur: NEGATIVE mg/dL
NITRITE: NEGATIVE
PH: 6.5 (ref 5.0–8.0)
Protein, ur: NEGATIVE mg/dL
SPECIFIC GRAVITY, URINE: 1.006 (ref 1.005–1.030)
Urobilinogen, UA: 0.2 mg/dL (ref 0.0–1.0)

## 2014-11-25 LAB — I-STAT CG4 LACTIC ACID, ED
LACTIC ACID, VENOUS: 2.43 mmol/L — AB (ref 0.5–2.0)
LACTIC ACID, VENOUS: 1.49 mmol/L (ref 0.5–2.0)

## 2014-11-25 LAB — URINE MICROSCOPIC-ADD ON

## 2014-11-25 LAB — MAGNESIUM: MAGNESIUM: 1.8 mg/dL (ref 1.7–2.4)

## 2014-11-25 LAB — I-STAT TROPONIN, ED
TROPONIN I, POC: 0.01 ng/mL (ref 0.00–0.08)
TROPONIN I, POC: 0.02 ng/mL (ref 0.00–0.08)

## 2014-11-25 MED ORDER — SODIUM CHLORIDE 0.9 % IV BOLUS (SEPSIS)
1000.0000 mL | Freq: Once | INTRAVENOUS | Status: AC
Start: 2014-11-25 — End: 2014-11-25
  Administered 2014-11-25: 1000 mL via INTRAVENOUS

## 2014-11-25 MED ORDER — SODIUM CHLORIDE 0.9 % IV BOLUS (SEPSIS)
1000.0000 mL | Freq: Once | INTRAVENOUS | Status: AC
  Administered 2014-11-25: 1000 mL via INTRAVENOUS

## 2014-11-25 NOTE — ED Notes (Signed)
Delay in lab draw, edp in room

## 2014-11-25 NOTE — ED Notes (Signed)
Nurse getting labs 

## 2014-11-25 NOTE — ED Provider Notes (Signed)
CSN: [id number]     Arrival date & time 11/25/14  1258 History   First MD Initiated Contact with Patient 11/25/14 1305     Chief Complaint  Patient presents with  . Weakness     (Consider location/radiation/quality/duration/timing/severity/associated sxs/prior Treatment) HPI Comments: Chemo 2 weeks ago Radiation every day  Patient is a 74 y.o. female presenting with weakness.  Weakness This is a new problem. Episode onset: Since Monday. Associated symptoms include chest pain (1 second episodes intermittently at rest, pressure, brief, resolves spontaneously no associated symptoms). Pertinent negatives include no abdominal pain, no headaches and no shortness of breath. Exacerbated by: sitting to standing. Nothing relieves the symptoms. She has tried nothing for the symptoms. The treatment provided no relief.    Past Medical History  Diagnosis Date  . Hypertension   . Dyslipidemia   . Diabetes ([place])   . Hypothyroidism   . Pneumonia 2007  . Smoker     1.5 packs per day for 60 years.  Quit one month ago  . Anxiety   . Cancer ([place])   . Lung cancer ([place]) 8/216    hilar/mediastinum lung scca   Past Surgical History  Procedure Laterality Date  . Cesarean section     Family History  Problem Relation Age of Onset  . Clotting disorder Neg Hx   . Cancer Mother     lung non smoker  . Cancer Sister     colon  . Diabetes Sister   . Diabetes Mother    Social History  Substance Use Topics  . Smoking status: Former Smoker -- 1.50 packs/day for 60 years    Quit date: 09/12/2014  . Smokeless tobacco: None  . Alcohol Use: No   OB History    No data available     Review of Systems  Constitutional: Positive for appetite change (eating ok, not drinking fluids) and fatigue. Negative for fever.  HENT: Negative for sore throat.   Eyes: Negative for visual disturbance.  Respiratory: Negative for cough and shortness of breath.   Cardiovascular: Positive for chest pain (1 second  episodes intermittently at rest, pressure, brief, resolves spontaneously no associated symptoms).  Gastrointestinal: Positive for nausea and vomiting (2 x). Negative for abdominal pain, diarrhea, constipation and blood in stool.  Genitourinary: Negative for vaginal bleeding and difficulty urinating.  Musculoskeletal: Negative for back pain and neck pain.  Skin: Negative for rash.  Neurological: Positive for weakness and light-headedness. Negative for dizziness, syncope, facial asymmetry, numbness and headaches.      Allergies  Review of patient's allergies indicates no known allergies.  Home Medications   Prior to Admission medications   Medication Sig Start Date End Date Taking? Authorizing Provider  albuterol (PROVENTIL HFA;VENTOLIN HFA) 108 (90 BASE) MCG/ACT inhaler Inhale 2 puffs into the lungs every 6 (six) hours as needed for shortness of breath. 11/01/14  Yes [name], NP  cholecalciferol (VITAMIN D) 1000 UNITS tablet Take 1 tablet (1,000 Units total) by mouth daily. 11/01/14  Yes [name], NP  citalopram (CELEXA) 10 MG tablet Take 1 tablet (10 mg total) by mouth daily. 11/01/14  Yes [name], NP  emollient (BIAFINE) cream Apply topically as needed. 11/10/14  Yes [name], MD  glucose blood [facility] VERIO) test strip Test once daily. Patient taking differently: 1 each by Other route. One to two times daily, 11/15/14  Yes [name], NP  levothyroxine (SYNTHROID, LEVOTHROID) 112 MCG tablet Take 1 tablet (112 mcg total) by mouth 2 (two) times  daily. 11/01/14  Yes [name], NP  lidocaine-prilocaine (EMLA) cream Apply topically once. 10/20/14  Yes Gautam Juleen China, MD  LORazepam (ATIVAN) 0.5 MG tablet Take 1 tablet (0.5 mg total) by mouth every 8 (eight) hours as needed for anxiety (or nausea). Could take 2 tab 30-45 mins prior to PET/CT and MRI Brain 11/09/14  Yes [name], MD  ondansetron (ZOFRAN-ODT) 4 MG disintegrating tablet Take 4 mg by mouth every  8 (eight) hours as needed for nausea or vomiting.   Yes Historical Provider, MD  Jonetta Speak LANCETS FINE MISC Test once daily. 11/15/14  Yes [name], NP  senna-docusate (SENNA S) 8.6-50 MG tablet Take 2 tablets by mouth at bedtime as needed for mild constipation. 11/09/14  Yes [name], MD  simvastatin (ZOCOR) 20 MG tablet Take 1 tablet (20 mg total) by mouth at bedtime. 11/01/14  Yes [name], NP  sitaGLIPtin (JANUVIA) 100 MG tablet Take 1 tablet (100 mg total) by mouth daily. 11/01/14  Yes [name], NP  vitamin B-12 (CYANOCOBALAMIN) 1000 MCG tablet Take 1 tablet (1,000 mcg total) by mouth daily. 11/01/14  Yes [name], NP  lisinopril (PRINIVIL,ZESTRIL) 10 MG tablet Take 1 tablet (10 mg total) by mouth daily. 11/01/14   [name], NP  magic mouthwash w/lidocaine SOLN Take 5 mLs by mouth 4 (four) times daily as needed for mouth pain. Patient not taking: Reported on 11/15/2014 11/09/14   [name], MD  sucralfate (CARAFATE) 1 G tablet Dissolve 1 tablet in 10 mL H20 and swallow 5 min before meals Patient not taking: Reported on 11/25/2014 11/21/14   [name], MD   BP 148/74 mmHg  Pulse 110  Temp(Src) 98 F (36.7 C) (Oral)  Resp 26  SpO2 100% Physical Exam  Constitutional: She is oriented to person, place, and time. She appears well-developed and well-nourished. No distress.  HENT:  Head: Normocephalic and atraumatic.  Eyes: Conjunctivae and EOM are normal.  Neck: Normal range of motion.  Cardiovascular: Regular rhythm, normal heart sounds and intact distal pulses.  Tachycardia present.  Exam reveals no gallop and no friction rub.   No murmur heard. Pulmonary/Chest: Effort normal and breath sounds normal. No respiratory distress. She has no wheezes. She has no rales.  Abdominal: Soft. She exhibits no distension. There is no tenderness. There is no guarding.  Musculoskeletal: She exhibits no edema or tenderness.  Neurological: She is alert and  oriented to person, place, and time.  Skin: Skin is warm and dry. No rash noted. She is not diaphoretic. No erythema.  Nursing note and vitals reviewed.   ED Course  Procedures (including critical care time) Labs Review Labs Reviewed  COMPREHENSIVE METABOLIC PANEL - Abnormal; Notable for the following:    Sodium 133 (*)    Chloride 100 (*)    Glucose, Bld 233 (*)    Creatinine, Ser 1.25 (*)    Calcium 8.6 (*)    Albumin 3.4 (*)    GFR calc non Af Amer 41 (*)    GFR calc Af Amer 47 (*)    All other components within normal limits  CBC WITH DIFFERENTIAL/PLATELET - Abnormal; Notable for the following:    WBC 1.7 (*)    RBC 2.39 (*)    Hemoglobin 7.3 (*)    HCT 21.2 (*)    Platelets 105 (*)    Neutro Abs 0.3 (*)    Lymphs Abs 0.6 (*)    All other components within normal limits  URINALYSIS, ROUTINE W REFLEX MICROSCOPIC (NOT AT [facility]) - Abnormal; Notable for the following:    Leukocytes, UA TRACE (*)    All other components within normal limits  I-STAT CG4 LACTIC ACID, ED - Abnormal; Notable for the following:    Lactic Acid, Venous 2.43 (*)    All other components within normal limits  URINE CULTURE  MAGNESIUM  URINE MICROSCOPIC-ADD ON  I-STAT TROPOININ, ED  I-STAT CG4 LACTIC ACID, ED  I-STAT TROPOININ, ED  I-STAT CG4 LACTIC ACID, ED    Imaging Review No results found. I have personally reviewed and evaluated these images and lab results as part of my medical decision-making.   EKG Interpretation   Date/Time:  Friday November 25 2014 13:07:01 EDT Ventricular Rate:  102 PR Interval:  179 QRS Duration: 92 QT Interval:  354 QTC Calculation: 461 R Axis:   31 Text Interpretation:  Sinus tachycardia Abnormal R-wave progression, early  transition Baseline wander No previous ECGs available Confirmed by  [facility] MD, Chayanne Filippi ([id number]) on 11/25/2014 6:11:08 PM      MDM   Final diagnoses:  Pancytopenia due to antineoplastic chemotherapy ([place])  Chemotherapy-induced  neutropenia (HCC)  Orthostatic hypotension  Dehydration   74 year old female with a history of small cell lung cancer, hypertension, diabetes, hypothyroidism, hyperlipidemia who presents from radiation oncology for concern of lightheadedness and orthostatic hypotension.  Patient reports not taking his much fluids as oncology had recommended, and that she declined IV fluids on Monday and Wednesday for orthostasis, however today became increasingly symptomatic and blood pressures were initially 20U systolic with sitting with radiation oncology. Patient's lactate initially 2.43. She was given 2 L of normal saline, and repeat lactate is within normal limits.   Patient after the first liter of fluid reports she is no longer symptomatic, and had normal blood pressures point from sitting to standing without symptoms.  Labs show creatinine of 1.25 from 1, indicating likely dehydration.  Patient has pancytopenia with absolute neutrophil count of 300, hemoglobin of 7.3, and platelets 105.  Pancytopenia likely secondary to chemotherapy, doubt anemia secondary to bleeding based off of history.  Patient reports approximately 3 episodes of 1 second chest pressure without other symptoms, not pleuritic, no SOB, no hypoxia, with negative delta troponins, and low suspicion at this time for PE/anginal equivalent.  Patient neutropenic, however afebrile, without source of infection.  Discussed neutropenic precautions with pt.  Pt feeling improved, normal blood pressures. Mild tachycardia may be secondary to anemia however pt not symptomatic at this time, no cardiac hx, and do not feel emergent transfusion is indicated at this time.  Discussed with Oncology Dr. Alen Blew, patient, and family, and per patient preferences of discharge home, will have her follow up with Oncology on Monday for likely transfusion and return if she develops any symptoms.  Patient and family in agreement with plan.      Gareth Morgan, MD 11/25/14  [phone]

## 2014-11-25 NOTE — ED Notes (Signed)
edp still in room

## 2014-11-25 NOTE — ED Notes (Signed)
Notified edp and nurse the results of the Istat lactic acid

## 2014-11-25 NOTE — Progress Notes (Signed)
Spiritual Care Note  Spent >1 hour with pt ("Rachel Chapman") and family (son Boneta Lucks) in ED.  Provided emotional support and safe place for pt/family to share 1:1 about complex family dynamics, a source of ongoing stress for pt.  Continuing to follow for support.  [place], [place], [place] Pager [phone] Voicemail  [phone]

## 2014-11-25 NOTE — ED Notes (Signed)
Pt decline having port accessed due to not having EMLA cream at bedside.  Pt states she prefers to have peripheral IV started.

## 2014-11-25 NOTE — ED Notes (Signed)
Bed: WA07 Expected date:  Expected time:  Means of arrival:  Comments: Pt from cancer ctr low bp, dehydration

## 2014-11-25 NOTE — ED Notes (Addendum)
Patient brought from cancer center for hypotension and weakness.  Pt has been feeling weak for the last week and was advised to get IV fluids, which she has refused to this point.  Pt is currently undergoing chemo and radiation for lung cancer and was in for radiation treatment today and was weak and found to be hypotensive and orthostatic.  Pts BP in cancer center was 88/57 sitting, 59/37 standing, HR 114.

## 2014-11-25 NOTE — ED Notes (Signed)
Pt. was made aware that we need urine .  PT. Stated not at this time .

## 2014-11-27 LAB — URINE CULTURE

## 2014-11-28 ENCOUNTER — Telehealth: Payer: Self-pay

## 2014-11-28 ENCOUNTER — Ambulatory Visit
Admission: RE | Admit: 2014-11-28 | Discharge: 2014-11-28 | Disposition: A | Discharge status: Home / Self Care | Payer: Medicare Other | Source: Ambulatory Visit | Attending: Radiation Oncology | Admitting: Radiation Oncology

## 2014-11-28 ENCOUNTER — Ambulatory Visit: Payer: Medicare Other

## 2014-11-28 ENCOUNTER — Encounter: Payer: Self-pay | Admitting: Radiation Oncology

## 2014-11-28 VITALS — BP 84/49 | HR 102 | Temp 98.9°F | Ht 63.0 in | Wt 147.7 lb

## 2014-11-28 DIAGNOSIS — Z510 Encounter for antineoplastic radiation therapy: Secondary | ICD-10-CM | POA: Diagnosis not present

## 2014-11-28 DIAGNOSIS — C3490 Malignant neoplasm of unspecified part of unspecified bronchus or lung: Secondary | ICD-10-CM

## 2014-11-28 NOTE — Progress Notes (Addendum)
Weekly Management Note:  Outpatient    ICD-9-CM ICD-10-CM   1. Small cell lung cancer, unspecified laterality (HCC) 162.9 C34.90 0.9 %  sodium chloride infusion    Current Dose:  32Gy  Projected Dose: 60 Gy   Narrative:  The patient presents for routine under treatment assessment.  CBCT/MVCT images/Port film x-rays were reviewed.  The chart was checked.  Seen in ED on 10-14 due to severe orthostatic hypotension, received IV fluids. Still orthostatic today, but less so  She reports she is eating and drinking slightly better. She is still orthostatic with a sitting BP of 105/76, pulse 100, and standing BP of 84/49, pulse 102. She reports some mild fatigue, and naps in the afternoon, though she states this was her habit before she started radiation.  She denies any pain at her radiation site.     Physical Findings:  Wt Readings from Last 3 Encounters:  11/28/14 147 lb 11.2 oz (66.996 kg)  11/21/14 145 lb 11.2 oz (66.089 kg)  11/15/14 143 lb 12.8 oz (65.227 kg)    height is '5\' 3"'$  (1.6 m) and weight is 147 lb 11.2 oz (66.996 kg). Her temperature is 98.9 F (37.2 C). Her blood pressure is 84/49 and her pulse is 102.  NAD, non toxic appearing, ambulatory  CBC    Component Value Date/Time   WBC 1.7* 11/25/2014 1412   WBC 6.7 11/09/2014 1019   RBC 2.39* 11/25/2014 1412   RBC 3.20* 11/09/2014 1019   HGB 7.3* 11/25/2014 1412   HGB 9.7* 11/09/2014 1019   HCT 21.2* 11/25/2014 1412   HCT 28.3* 11/09/2014 1019   PLT 105* 11/25/2014 1412   PLT 303 11/09/2014 1019   MCV 88.7 11/25/2014 1412   MCV 88.4 11/09/2014 1019   MCH 30.5 11/25/2014 1412   MCH 30.3 11/09/2014 1019   MCHC 34.4 11/25/2014 1412   MCHC 34.3 11/09/2014 1019   RDW 15.5 11/25/2014 1412   RDW 15.0* 11/09/2014 1019   LYMPHSABS 0.6* 11/25/2014 1412   LYMPHSABS 1.2 11/09/2014 1019   MONOABS 0.7 11/25/2014 1412   MONOABS 1.2* 11/09/2014 1019   EOSABS 0.1 11/25/2014 1412   EOSABS 0.1 11/09/2014 1019   BASOSABS 0.0  11/25/2014 1412   BASOSABS 0.0 11/09/2014 1019     CMP     Component Value Date/Time   NA 133* 11/25/2014 1412   NA 137 11/22/2014 1317   K 3.9 11/25/2014 1412   K 4.8 11/22/2014 1317   CL 100* 11/25/2014 1412   CO2 26 11/25/2014 1412   CO2 25 11/22/2014 1317   GLUCOSE 233* 11/25/2014 1412   GLUCOSE 242* 11/22/2014 1317   BUN 19 11/25/2014 1412   BUN 21.6 11/22/2014 1317   CREATININE 1.25* 11/25/2014 1412   CREATININE 1.0 11/22/2014 1317   CALCIUM 8.6* 11/25/2014 1412   CALCIUM 9.0 11/22/2014 1317   PROT 6.7 11/25/2014 1412   PROT 6.2* 11/09/2014 1020   ALBUMIN 3.4* 11/25/2014 1412   ALBUMIN 3.5 11/09/2014 1020   AST 21 11/25/2014 1412   AST 12 11/09/2014 1020   ALT 29 11/25/2014 1412   ALT 22 11/09/2014 1020   ALKPHOS 63 11/25/2014 1412   ALKPHOS 66 11/09/2014 1020   BILITOT 0.4 11/25/2014 1412   BILITOT <0.30 11/09/2014 1020   GFRNONAA 41* 11/25/2014 1412   GFRAA 47* 11/25/2014 1412     Impression:  The patient is tolerating radiotherapy.   Plan:  Continue radiotherapy as planned.   Pt declined IV fluids today, but  willing to do so tomorrow if an appt can be arranged in the AM, before RT, in med/onc.  Unwilling to get IV fluids in sickle cell clinic.  Our nurse will look into scheduling for her.  I will ask Dr Irene Limbo if twice or thrice weekly appts can be arranged for IV fluids to get her through ChRT for the duration of her treatments  Sucralfate for early esophagitis symptoms.  Anemia - will notify Dr Irene Limbo as she may benefit from blood transfusion this week.   -----------------------------------  [name], MD

## 2014-11-28 NOTE — Telephone Encounter (Signed)
Spoke to patients daughter on the phone. Velena has stopped her blood pressure medication last week. Was seen in the ER for weakness and hypotension. Her H&H was 7.3 and 21 in the ER. It was recommended that she had a blood transfusion but patient refused. Daughter is worried about her blood counts and dehydration since the patient does not like to drink fluids.   They are following up with radiation oncology today and has chemo at the end of the week.

## 2014-11-28 NOTE — Telephone Encounter (Signed)
Per Cory's request called to see how pt was doing and to make sure she had stopped her blood pressure medicaiton. Spoke with pt's daughter and she states the blood pressure medicaiton was stopped by the oncologist last week.  Per Langley Gauss pt had a good weekend.

## 2014-11-28 NOTE — Progress Notes (Signed)
Ms. Kittrell is here for her 16th fraction to her lung. She reports she is eating and drinking slightly better. She is still orthostatic with a sitting BP of 105/76, pulse 100, and standing BP of 84/49, pulse 102. She reports some mild fatigue, and naps in the afternoon, though she states this was her habit before she started radiation. Her skin is intact, with no redness noted at this time. She denies any pain at her radiation site.   BP 84/49 mmHg  Pulse 102  Temp(Src) 98.9 F (37.2 C)  Ht '5\' 3"'$  (1.6 m)  Wt 147 lb 11.2 oz (66.996 kg)  BMI 26.17 kg/m2   Wt Readings from Last 3 Encounters:  11/28/14 147 lb 11.2 oz (66.996 kg)  11/21/14 145 lb 11.2 oz (66.089 kg)  11/15/14 143 lb 12.8 oz (65.227 kg)

## 2014-11-28 NOTE — Progress Notes (Signed)
I have spoken to Rachel Chapman regarding iv fluids ordered by Dr. Isidore Moos for tomorrow 11/29/14. She is agreeable to come in at 2:30 to receive iv fluids in the cancer center, and her radiation treatment has been changed to 1:40 to assist with an improved schedule for the patient.

## 2014-11-29 ENCOUNTER — Other Ambulatory Visit: Payer: Self-pay | Admitting: *Deleted

## 2014-11-29 ENCOUNTER — Ambulatory Visit
Admission: RE | Admit: 2014-11-29 | Discharge: 2014-11-29 | Disposition: A | Discharge status: Home / Self Care | Payer: Medicare Other | Source: Ambulatory Visit | Attending: Radiation Oncology | Admitting: Radiation Oncology

## 2014-11-29 ENCOUNTER — Ambulatory Visit (HOSPITAL_COMMUNITY)
Admission: RE | Admit: 2014-11-29 | Discharge: 2014-11-29 | Disposition: A | Discharge status: Home / Self Care | Payer: Medicare Other | Source: Ambulatory Visit | Attending: Radiation Oncology | Admitting: Radiation Oncology

## 2014-11-29 ENCOUNTER — Ambulatory Visit (HOSPITAL_COMMUNITY)
Admission: RE | Admit: 2014-11-29 | Discharge: 2014-11-29 | Disposition: A | Discharge status: Home / Self Care | Payer: Medicare Other | Source: Ambulatory Visit | Attending: Hematology | Admitting: Hematology

## 2014-11-29 ENCOUNTER — Telehealth: Payer: Self-pay | Admitting: *Deleted

## 2014-11-29 ENCOUNTER — Other Ambulatory Visit: Payer: Self-pay | Admitting: Hematology

## 2014-11-29 ENCOUNTER — Ambulatory Visit: Payer: Medicare Other

## 2014-11-29 DIAGNOSIS — K8020 Calculus of gallbladder without cholecystitis without obstruction: Secondary | ICD-10-CM | POA: Insufficient documentation

## 2014-11-29 DIAGNOSIS — D6481 Anemia due to antineoplastic chemotherapy: Secondary | ICD-10-CM | POA: Insufficient documentation

## 2014-11-29 DIAGNOSIS — I700 Atherosclerosis of aorta: Secondary | ICD-10-CM | POA: Insufficient documentation

## 2014-11-29 DIAGNOSIS — C3490 Malignant neoplasm of unspecified part of unspecified bronchus or lung: Secondary | ICD-10-CM | POA: Insufficient documentation

## 2014-11-29 DIAGNOSIS — M47896 Other spondylosis, lumbar region: Secondary | ICD-10-CM | POA: Insufficient documentation

## 2014-11-29 DIAGNOSIS — M4317 Spondylolisthesis, lumbosacral region: Secondary | ICD-10-CM | POA: Diagnosis not present

## 2014-11-29 DIAGNOSIS — T451X5A Adverse effect of antineoplastic and immunosuppressive drugs, initial encounter: Principal | ICD-10-CM

## 2014-11-29 DIAGNOSIS — Z79899 Other long term (current) drug therapy: Secondary | ICD-10-CM | POA: Diagnosis not present

## 2014-11-29 DIAGNOSIS — C3491 Malignant neoplasm of unspecified part of right bronchus or lung: Secondary | ICD-10-CM | POA: Insufficient documentation

## 2014-11-29 DIAGNOSIS — J90 Pleural effusion, not elsewhere classified: Secondary | ICD-10-CM | POA: Diagnosis not present

## 2014-11-29 DIAGNOSIS — Z510 Encounter for antineoplastic radiation therapy: Secondary | ICD-10-CM | POA: Diagnosis not present

## 2014-11-29 LAB — GLUCOSE, CAPILLARY: GLUCOSE-CAPILLARY: 214 mg/dL — AB (ref 65–99)

## 2014-11-29 MED ORDER — FLUDEOXYGLUCOSE F - 18 (FDG) INJECTION
7.2600 | Freq: Once | INTRAVENOUS | Status: DC | PRN
  Administered 2014-11-29: 7.26 via INTRAVENOUS
  Filled 2014-11-29: qty 7.26

## 2014-11-29 NOTE — Telephone Encounter (Signed)
Per staff message  I have scheduled appts. Advised desk rn of appts. JMW

## 2014-11-30 ENCOUNTER — Ambulatory Visit
Admission: RE | Admit: 2014-11-30 | Discharge: 2014-11-30 | Disposition: A | Discharge status: Home / Self Care | Payer: Medicare Other | Source: Ambulatory Visit | Attending: Radiation Oncology | Admitting: Radiation Oncology

## 2014-11-30 ENCOUNTER — Other Ambulatory Visit: Payer: Self-pay | Admitting: Neurology

## 2014-11-30 ENCOUNTER — Ambulatory Visit (HOSPITAL_BASED_OUTPATIENT_CLINIC_OR_DEPARTMENT_OTHER): Payer: Medicare Other

## 2014-11-30 ENCOUNTER — Ambulatory Visit: Payer: Medicare Other

## 2014-11-30 ENCOUNTER — Ambulatory Visit (HOSPITAL_BASED_OUTPATIENT_CLINIC_OR_DEPARTMENT_OTHER): Payer: Medicare Other | Admitting: Hematology

## 2014-11-30 ENCOUNTER — Telehealth: Payer: Self-pay | Admitting: Hematology

## 2014-11-30 ENCOUNTER — Encounter: Payer: Self-pay | Admitting: Hematology

## 2014-11-30 VITALS — BP 157/78 | HR 94 | Temp 98.5°F | Resp 20

## 2014-11-30 VITALS — BP 118/60 | HR 105 | Temp 98.0°F | Resp 18 | Ht 63.0 in | Wt 146.8 lb

## 2014-11-30 DIAGNOSIS — Z5111 Encounter for antineoplastic chemotherapy: Secondary | ICD-10-CM

## 2014-11-30 DIAGNOSIS — C3490 Malignant neoplasm of unspecified part of unspecified bronchus or lung: Secondary | ICD-10-CM | POA: Diagnosis present

## 2014-11-30 DIAGNOSIS — D6481 Anemia due to antineoplastic chemotherapy: Secondary | ICD-10-CM | POA: Diagnosis not present

## 2014-11-30 DIAGNOSIS — D649 Anemia, unspecified: Secondary | ICD-10-CM | POA: Diagnosis present

## 2014-11-30 DIAGNOSIS — Z95828 Presence of other vascular implants and grafts: Secondary | ICD-10-CM

## 2014-11-30 DIAGNOSIS — T451X5A Adverse effect of antineoplastic and immunosuppressive drugs, initial encounter: Principal | ICD-10-CM

## 2014-11-30 DIAGNOSIS — C781 Secondary malignant neoplasm of mediastinum: Secondary | ICD-10-CM

## 2014-11-30 DIAGNOSIS — Z510 Encounter for antineoplastic radiation therapy: Secondary | ICD-10-CM | POA: Diagnosis not present

## 2014-11-30 DIAGNOSIS — E119 Type 2 diabetes mellitus without complications: Secondary | ICD-10-CM | POA: Diagnosis not present

## 2014-11-30 DIAGNOSIS — C3491 Malignant neoplasm of unspecified part of right bronchus or lung: Secondary | ICD-10-CM | POA: Diagnosis present

## 2014-11-30 LAB — CBC & DIFF AND RETIC
BASO%: 0.2 % (ref 0.0–2.0)
BASOS ABS: 0 10*3/uL (ref 0.0–0.1)
EOS ABS: 0.1 10*3/uL (ref 0.0–0.5)
EOS%: 1.3 % (ref 0.0–7.0)
HEMATOCRIT: 20.4 % — AB (ref 34.8–46.6)
HEMOGLOBIN: 6.7 g/dL — AB (ref 11.6–15.9)
IMMATURE RETIC FRACT: 24.8 % — AB (ref 1.60–10.00)
LYMPH%: 18.2 % (ref 14.0–49.7)
MCH: 30.6 pg (ref 25.1–34.0)
MCHC: 32.8 g/dL (ref 31.5–36.0)
MCV: 93.2 fL (ref 79.5–101.0)
MONO#: 1.2 10*3/uL — AB (ref 0.1–0.9)
MONO%: 24.6 % — AB (ref 0.0–14.0)
NEUT#: 2.6 10*3/uL (ref 1.5–6.5)
NEUT%: 55.7 % (ref 38.4–76.8)
Platelets: 192 10*3/uL (ref 145–400)
RBC: 2.19 10*6/uL — ABNORMAL LOW (ref 3.70–5.45)
RDW: 19.3 % — AB (ref 11.2–14.5)
RETIC %: 6.91 % — AB (ref 0.70–2.10)
Retic Ct Abs: 151.33 10*3/uL — ABNORMAL HIGH (ref 33.70–90.70)
WBC: 4.7 10*3/uL (ref 3.9–10.3)
lymph#: 0.9 10*3/uL (ref 0.9–3.3)

## 2014-11-30 LAB — COMPREHENSIVE METABOLIC PANEL (CC13)
ALBUMIN: 2.9 g/dL — AB (ref 3.5–5.0)
ALK PHOS: 61 U/L (ref 40–150)
ALT: 15 U/L (ref 0–55)
AST: 12 U/L (ref 5–34)
Anion Gap: 6 mEq/L (ref 3–11)
BUN: 11.4 mg/dL (ref 7.0–26.0)
CALCIUM: 8.6 mg/dL (ref 8.4–10.4)
CO2: 24 mEq/L (ref 22–29)
CREATININE: 1 mg/dL (ref 0.6–1.1)
Chloride: 107 mEq/L (ref 98–109)
EGFR: 54 mL/min/{1.73_m2} — ABNORMAL LOW (ref >90)
GLUCOSE: 235 mg/dL — AB (ref 70–140)
POTASSIUM: 4.5 meq/L (ref 3.5–5.1)
SODIUM: 137 meq/L (ref 136–145)
TOTAL PROTEIN: 5.9 g/dL — AB (ref 6.4–8.3)

## 2014-11-30 LAB — ABO/RH: ABO/RH(D): B POS

## 2014-11-30 LAB — TECHNOLOGIST REVIEW

## 2014-11-30 LAB — LACTATE DEHYDROGENASE (CC13): LDH: 195 U/L (ref 125–245)

## 2014-11-30 MED ORDER — SODIUM CHLORIDE 0.9 % IV SOLN
Freq: Once | INTRAVENOUS | Status: AC
  Administered 2014-11-30: 11:00:00 via INTRAVENOUS

## 2014-11-30 MED ORDER — HEPARIN SOD (PORK) LOCK FLUSH 100 UNIT/ML IV SOLN
500.0000 [IU] | Freq: Once | INTRAVENOUS | Status: AC | PRN
  Administered 2014-11-30: 500 [IU]
  Filled 2014-11-30: qty 5

## 2014-11-30 MED ORDER — SODIUM CHLORIDE 0.9 % IV SOLN
250.0000 mL | Freq: Once | INTRAVENOUS | Status: AC
  Administered 2014-11-30: 250 mL via INTRAVENOUS

## 2014-11-30 MED ORDER — ACETAMINOPHEN 325 MG PO TABS
650.0000 mg | ORAL_TABLET | Freq: Once | ORAL | Status: AC
  Administered 2014-11-30: 650 mg via ORAL

## 2014-11-30 MED ORDER — SODIUM CHLORIDE 0.9 % IJ SOLN
10.0000 mL | INTRAMUSCULAR | Status: DC | PRN
Start: 2014-11-30 — End: 2014-11-30
  Administered 2014-11-30: 10 mL
  Filled 2014-11-30: qty 10

## 2014-11-30 MED ORDER — ACETAMINOPHEN 325 MG PO TABS
ORAL_TABLET | ORAL | Status: AC
  Filled 2014-11-30: qty 2

## 2014-11-30 MED ORDER — SODIUM CHLORIDE 0.9 % IV SOLN
300.0000 mg | Freq: Once | INTRAVENOUS | Status: AC
  Administered 2014-11-30: 300 mg via INTRAVENOUS
  Filled 2014-11-30: qty 30

## 2014-11-30 MED ORDER — SODIUM CHLORIDE 0.9 % IV SOLN
100.0000 mg/m2 | Freq: Once | INTRAVENOUS | Status: AC
  Administered 2014-11-30: 170 mg via INTRAVENOUS
  Filled 2014-11-30: qty 8.5

## 2014-11-30 MED ORDER — DIPHENHYDRAMINE HCL 25 MG PO CAPS
25.0000 mg | ORAL_CAPSULE | Freq: Once | ORAL | Status: AC
  Administered 2014-11-30: 25 mg via ORAL

## 2014-11-30 MED ORDER — SODIUM CHLORIDE 0.9 % IJ SOLN
10.0000 mL | INTRAMUSCULAR | Status: DC | PRN
  Administered 2014-11-30: 10 mL via INTRAVENOUS
  Filled 2014-11-30: qty 10

## 2014-11-30 MED ORDER — SODIUM CHLORIDE 0.9 % IV SOLN
Freq: Once | INTRAVENOUS | Status: AC
  Administered 2014-11-30: 12:00:00 via INTRAVENOUS
  Filled 2014-11-30: qty 8

## 2014-11-30 MED ORDER — DIPHENHYDRAMINE HCL 25 MG PO CAPS
ORAL_CAPSULE | ORAL | Status: AC
  Filled 2014-11-30: qty 1

## 2014-11-30 NOTE — Patient Instructions (Signed)

## 2014-11-30 NOTE — Telephone Encounter (Signed)
Appointments added per pof and patient will get a new schedule 10/20

## 2014-11-30 NOTE — Patient Instructions (Signed)
[facility] Discharge Instructions for Patients Receiving Chemotherapy  Today you received the following chemotherapy agents carboplatin, etoposide  To help prevent nausea and vomiting after your treatment, we encourage you to take your nausea medication   If you develop nausea and vomiting that is not controlled by your nausea medication, call the clinic.   BELOW ARE SYMPTOMS THAT SHOULD BE REPORTED IMMEDIATELY:  *FEVER GREATER THAN 100.5 F  *CHILLS WITH OR WITHOUT FEVER  NAUSEA AND VOMITING THAT IS NOT CONTROLLED WITH YOUR NAUSEA MEDICATION  *UNUSUAL SHORTNESS OF BREATH  *UNUSUAL BRUISING OR BLEEDING  TENDERNESS IN MOUTH AND THROAT WITH OR WITHOUT PRESENCE OF ULCERS  *URINARY PROBLEMS  *BOWEL PROBLEMS  UNUSUAL RASH Items with * indicate a potential emergency and should be followed up as soon as possible.  Feel free to call the clinic you have any questions or concerns. The clinic phone number is [phone].  Please show the [facility] at check-in to the Emergency Department and triage nurse.  Blood Transfusion  A blood transfusion is a procedure in which you receive donated blood through an IV tube. You may need a blood transfusion because of illness, surgery, or injury. The blood may come from a donor, or it may be your own blood that you donated previously. The blood given in a transfusion is made up of different types of cells. You may receive:  Red blood cells. These carry oxygen and replace lost blood.  Platelets. These control bleeding.  Plasma. Thishelps blood to clot. If you have hemophilia or another clotting disorder, you may also receive other types of blood products. LET [facility] CARE PROVIDER KNOW ABOUT:  Any allergies you have.  All medicines you are taking, including vitamins, herbs, eye drops, creams, and over-the-counter medicines.  Previous problems you or members of your family have had with the use of  anesthetics.  Any blood disorders you have.  Previous surgeries you have had.  Any medical conditions you may have.  Any previous reactions you have had during a blood transfusion.  RISKS AND COMPLICATIONS Generally, this is a safe procedure. However, problems may occur, including:  Having an allergic reaction to something in the donated blood.  Fever. This may be a reaction to the white blood cells in the transfused blood.  Iron overload. This can happen from having many transfusions.  Transfusion-related acute lung injury (TRALI). This is a rare reaction that causes lung damage. The cause is not known.TRALI can occur within hours of a transfusion or several days later.  Sudden (acute) or delayed hemolytic reactions. This happens if your blood does not match the cells in your transfusion. Your body's defense system (immune system) may try to attack the new cells. This complication is rare.  Infection. This is rare. BEFORE THE PROCEDURE  You may have a blood test to determine your blood type. This is necessary to know what kind of blood your body will accept.  If you are going to have a planned surgery, you may donate your own blood. This may be done in case you need to have a transfusion.  If you have had an allergic reaction to a transfusion in the past, you may be given medicine to help prevent a reaction. Take this medicine only as directed by your health care provider.  You will have your temperature, blood pressure, and pulse monitored before the transfusion. PROCEDURE   An IV will be started in your hand or arm.  The bag  of donated blood will be attached to your IV tube and given into your vein.  Your temperature, blood pressure, and pulse will be monitored regularly during the transfusion. This monitoring is done to detect early signs of a transfusion reaction.  If you have any signs or symptoms of a reaction, your transfusion will be stopped and you may be given  medicine.  When the transfusion is over, your IV will be removed.  Pressure may be applied to the IV site for a few minutes.  A bandage (dressing) will be applied. The procedure may vary among health care providers and hospitals. AFTER THE PROCEDURE  Your blood pressure, temperature, and pulse will be monitored regularly.   This information is not intended to replace advice given to you by your health care provider. Make sure you discuss any questions you have with your health care provider.   Document Released: 01/26/2000 Document Revised: 02/18/2014 Document Reviewed: 12/08/2013 Elsevier Interactive Patient Education [employer].

## 2014-11-30 NOTE — Progress Notes (Signed)
Hemoglobin 6.7. Spoke with Delle Reining RN, Dr Grier Mitts nurse. Pt to receive 1 unit of blood today, and 1 unit tomorrow. Per MD okay to treat.

## 2014-12-01 ENCOUNTER — Ambulatory Visit: Payer: Medicare Other | Admitting: Nutrition

## 2014-12-01 ENCOUNTER — Ambulatory Visit
Admission: RE | Admit: 2014-12-01 | Discharge: 2014-12-01 | Disposition: A | Discharge status: Home / Self Care | Payer: Medicare Other | Source: Ambulatory Visit | Attending: Radiation Oncology | Admitting: Radiation Oncology

## 2014-12-01 ENCOUNTER — Other Ambulatory Visit: Payer: Self-pay | Admitting: *Deleted

## 2014-12-01 ENCOUNTER — Ambulatory Visit: Payer: Medicare Other

## 2014-12-01 ENCOUNTER — Ambulatory Visit (HOSPITAL_BASED_OUTPATIENT_CLINIC_OR_DEPARTMENT_OTHER): Payer: Medicare Other

## 2014-12-01 VITALS — BP 148/73 | HR 106 | Temp 97.2°F | Resp 18

## 2014-12-01 DIAGNOSIS — D649 Anemia, unspecified: Secondary | ICD-10-CM

## 2014-12-01 DIAGNOSIS — C3490 Malignant neoplasm of unspecified part of unspecified bronchus or lung: Secondary | ICD-10-CM

## 2014-12-01 DIAGNOSIS — Z5111 Encounter for antineoplastic chemotherapy: Secondary | ICD-10-CM

## 2014-12-01 DIAGNOSIS — Z510 Encounter for antineoplastic radiation therapy: Secondary | ICD-10-CM | POA: Diagnosis not present

## 2014-12-01 DIAGNOSIS — T451X5A Adverse effect of antineoplastic and immunosuppressive drugs, initial encounter: Secondary | ICD-10-CM

## 2014-12-01 DIAGNOSIS — C3491 Malignant neoplasm of unspecified part of right bronchus or lung: Secondary | ICD-10-CM | POA: Diagnosis not present

## 2014-12-01 DIAGNOSIS — D6481 Anemia due to antineoplastic chemotherapy: Secondary | ICD-10-CM

## 2014-12-01 MED ORDER — PROCHLORPERAZINE MALEATE 10 MG PO TABS
ORAL_TABLET | ORAL | Status: AC
Start: 2014-12-01 — End: 2014-12-01
  Filled 2014-12-01: qty 1

## 2014-12-01 MED ORDER — SODIUM CHLORIDE 0.9 % IJ SOLN
10.0000 mL | INTRAMUSCULAR | Status: DC | PRN
  Administered 2014-12-01: 10 mL
  Filled 2014-12-01: qty 10

## 2014-12-01 MED ORDER — DIPHENHYDRAMINE HCL 25 MG PO CAPS
ORAL_CAPSULE | ORAL | Status: AC
  Filled 2014-12-01: qty 1

## 2014-12-01 MED ORDER — SODIUM CHLORIDE 0.9 % IV SOLN
Freq: Once | INTRAVENOUS | Status: AC
  Administered 2014-12-01: 08:00:00 via INTRAVENOUS

## 2014-12-01 MED ORDER — HEPARIN SOD (PORK) LOCK FLUSH 100 UNIT/ML IV SOLN
500.0000 [IU] | Freq: Once | INTRAVENOUS | Status: AC | PRN
  Administered 2014-12-01: 500 [IU]
  Filled 2014-12-01: qty 5

## 2014-12-01 MED ORDER — PROCHLORPERAZINE MALEATE 10 MG PO TABS
10.0000 mg | ORAL_TABLET | Freq: Once | ORAL | Status: AC
  Administered 2014-12-01: 10 mg via ORAL

## 2014-12-01 MED ORDER — SODIUM CHLORIDE 0.9 % IV SOLN
100.0000 mg/m2 | Freq: Once | INTRAVENOUS | Status: AC
  Administered 2014-12-01: 170 mg via INTRAVENOUS
  Filled 2014-12-01: qty 8.5

## 2014-12-01 MED ORDER — DIPHENHYDRAMINE HCL 25 MG PO CAPS
25.0000 mg | ORAL_CAPSULE | Freq: Once | ORAL | Status: AC
  Administered 2014-12-01: 25 mg via ORAL

## 2014-12-01 MED ORDER — ACETAMINOPHEN 325 MG PO TABS
650.0000 mg | ORAL_TABLET | Freq: Once | ORAL | Status: AC
  Administered 2014-12-01: 650 mg via ORAL

## 2014-12-01 MED ORDER — ACETAMINOPHEN 325 MG PO TABS
ORAL_TABLET | ORAL | Status: AC
  Filled 2014-12-01: qty 2

## 2014-12-01 NOTE — Patient Instructions (Signed)
[facility] Discharge Instructions for Patients Receiving Chemotherapy  Today you received the following chemotherapy agents Etoposide.  To help prevent nausea and vomiting after your treatment, we encourage you to take your nausea medication as prescribed.   If you develop nausea and vomiting that is not controlled by your nausea medication, call the clinic.   BELOW ARE SYMPTOMS THAT SHOULD BE REPORTED IMMEDIATELY:  *FEVER GREATER THAN 100.5 F  *CHILLS WITH OR WITHOUT FEVER  NAUSEA AND VOMITING THAT IS NOT CONTROLLED WITH YOUR NAUSEA MEDICATION  *UNUSUAL SHORTNESS OF BREATH  *UNUSUAL BRUISING OR BLEEDING  TENDERNESS IN MOUTH AND THROAT WITH OR WITHOUT PRESENCE OF ULCERS  *URINARY PROBLEMS  *BOWEL PROBLEMS  UNUSUAL RASH Items with * indicate a potential emergency and should be followed up as soon as possible.  Feel free to call the clinic you have any questions or concerns. The clinic phone number is [phone].  Please show the [facility] at check-in to the Emergency Department and triage nurse.  Blood Transfusion  A blood transfusion is a procedure in which you receive donated blood through an IV tube. You may need a blood transfusion because of illness, surgery, or injury. The blood may come from a donor, or it may be your own blood that you donated previously. The blood given in a transfusion is made up of different types of cells. You may receive:  Red blood cells. These carry oxygen and replace lost blood.  Platelets. These control bleeding.  Plasma. Thishelps blood to clot. If you have hemophilia or another clotting disorder, you may also receive other types of blood products. LET [facility] CARE PROVIDER KNOW ABOUT:  Any allergies you have.  All medicines you are taking, including vitamins, herbs, eye drops, creams, and over-the-counter medicines.  Previous problems you or members of your family have had with the use of  anesthetics.  Any blood disorders you have.  Previous surgeries you have had.  Any medical conditions you may have.  Any previous reactions you have had during a blood transfusion.  RISKS AND COMPLICATIONS Generally, this is a safe procedure. However, problems may occur, including:  Having an allergic reaction to something in the donated blood.  Fever. This may be a reaction to the white blood cells in the transfused blood.  Iron overload. This can happen from having many transfusions.  Transfusion-related acute lung injury (TRALI). This is a rare reaction that causes lung damage. The cause is not known.TRALI can occur within hours of a transfusion or several days later.  Sudden (acute) or delayed hemolytic reactions. This happens if your blood does not match the cells in your transfusion. Your body's defense system (immune system) may try to attack the new cells. This complication is rare.  Infection. This is rare. BEFORE THE PROCEDURE  You may have a blood test to determine your blood type. This is necessary to know what kind of blood your body will accept.  If you are going to have a planned surgery, you may donate your own blood. This may be done in case you need to have a transfusion.  If you have had an allergic reaction to a transfusion in the past, you may be given medicine to help prevent a reaction. Take this medicine only as directed by your health care provider.  You will have your temperature, blood pressure, and pulse monitored before the transfusion. PROCEDURE   An IV will be started in your hand or arm.  The  bag of donated blood will be attached to your IV tube and given into your vein.  Your temperature, blood pressure, and pulse will be monitored regularly during the transfusion. This monitoring is done to detect early signs of a transfusion reaction.  If you have any signs or symptoms of a reaction, your transfusion will be stopped and you may be given  medicine.  When the transfusion is over, your IV will be removed.  Pressure may be applied to the IV site for a few minutes.  A bandage (dressing) will be applied. The procedure may vary among health care providers and hospitals. AFTER THE PROCEDURE  Your blood pressure, temperature, and pulse will be monitored regularly.   This information is not intended to replace advice given to you by your health care provider. Make sure you discuss any questions you have with your health care provider.   Document Released: 01/26/2000 Document Revised: 02/18/2014 Document Reviewed: 12/08/2013 Elsevier Interactive Patient Education [employer].

## 2014-12-01 NOTE — Progress Notes (Signed)
Nutrition follow up completed with patient and son during infusion. Patient reports she is well.  States her appetite is good. Weight is increased and documented as 146.8 pounds on October 19 increased from 141.4 pounds on September 28. Patient denies nutrition impact symptoms.  Diagnosis of Unintended Weight Loss resolved.  Encouraged patient to continue adequate calories and protein to promote weight maintenance.   Patient will contact me with questions or concerns.  **Disclaimer: This note was dictated with voice recognition software. Similar sounding words can inadvertently be transcribed and this note may contain transcription errors which may not have been corrected upon publication of note.**

## 2014-12-02 ENCOUNTER — Ambulatory Visit (HOSPITAL_BASED_OUTPATIENT_CLINIC_OR_DEPARTMENT_OTHER): Payer: Medicare Other

## 2014-12-02 ENCOUNTER — Ambulatory Visit: Payer: Medicare Other

## 2014-12-02 ENCOUNTER — Ambulatory Visit
Admission: RE | Admit: 2014-12-02 | Discharge: 2014-12-02 | Disposition: A | Discharge status: Home / Self Care | Payer: Medicare Other | Source: Ambulatory Visit | Attending: Radiation Oncology | Admitting: Radiation Oncology

## 2014-12-02 ENCOUNTER — Telehealth: Payer: Self-pay | Admitting: Adult Health

## 2014-12-02 VITALS — BP 166/85 | HR 107 | Temp 98.0°F | Resp 16

## 2014-12-02 DIAGNOSIS — C3490 Malignant neoplasm of unspecified part of unspecified bronchus or lung: Secondary | ICD-10-CM

## 2014-12-02 DIAGNOSIS — Z5111 Encounter for antineoplastic chemotherapy: Secondary | ICD-10-CM

## 2014-12-02 DIAGNOSIS — Z510 Encounter for antineoplastic radiation therapy: Secondary | ICD-10-CM | POA: Diagnosis not present

## 2014-12-02 DIAGNOSIS — E119 Type 2 diabetes mellitus without complications: Secondary | ICD-10-CM

## 2014-12-02 LAB — TYPE AND SCREEN
ABO/RH(D): B POS
Antibody Screen: NEGATIVE
UNIT DIVISION: 0
Unit division: 0

## 2014-12-02 MED ORDER — PROCHLORPERAZINE MALEATE 10 MG PO TABS
10.0000 mg | ORAL_TABLET | Freq: Once | ORAL | Status: AC
  Administered 2014-12-02: 10 mg via ORAL

## 2014-12-02 MED ORDER — ETOPOSIDE CHEMO INJECTION 1 GM/50ML
100.0000 mg/m2 | Freq: Once | INTRAVENOUS | Status: AC
  Administered 2014-12-02: 170 mg via INTRAVENOUS
  Filled 2014-12-02: qty 8.5

## 2014-12-02 MED ORDER — SODIUM CHLORIDE 0.9 % IJ SOLN
10.0000 mL | INTRAMUSCULAR | Status: DC | PRN
  Administered 2014-12-02: 10 mL
  Filled 2014-12-02: qty 10

## 2014-12-02 MED ORDER — PROCHLORPERAZINE MALEATE 10 MG PO TABS
ORAL_TABLET | ORAL | Status: AC
Start: 2014-12-02 — End: 2014-12-02
  Filled 2014-12-02: qty 1

## 2014-12-02 MED ORDER — SODIUM CHLORIDE 0.9 % IV SOLN
Freq: Once | INTRAVENOUS | Status: AC
  Administered 2014-12-02: 10:00:00 via INTRAVENOUS

## 2014-12-02 MED ORDER — HEPARIN SOD (PORK) LOCK FLUSH 100 UNIT/ML IV SOLN
500.0000 [IU] | Freq: Once | INTRAVENOUS | Status: AC | PRN
  Administered 2014-12-02: 500 [IU]
  Filled 2014-12-02: qty 5

## 2014-12-02 NOTE — Telephone Encounter (Signed)
Ok per [place] to enter referral for podiatry.  Pt's daughter Langley Gauss is aware.

## 2014-12-02 NOTE — Telephone Encounter (Signed)
Pt daughter call to ask for a referral to a foot doctor. She said Mom is diabetic and need to see a foot doctor to take care of her toe nails

## 2014-12-05 ENCOUNTER — Ambulatory Visit
Admission: RE | Admit: 2014-12-05 | Discharge: 2014-12-05 | Disposition: A | Discharge status: Home / Self Care | Payer: Medicare Other | Source: Ambulatory Visit | Attending: Radiation Oncology | Admitting: Radiation Oncology

## 2014-12-05 ENCOUNTER — Ambulatory Visit: Payer: Medicare Other

## 2014-12-05 ENCOUNTER — Encounter: Payer: Self-pay | Admitting: Radiation Oncology

## 2014-12-05 VITALS — BP 68/47 | HR 102 | Temp 97.6°F | Ht 63.0 in | Wt 142.4 lb

## 2014-12-05 DIAGNOSIS — C3490 Malignant neoplasm of unspecified part of unspecified bronchus or lung: Secondary | ICD-10-CM

## 2014-12-05 DIAGNOSIS — Z510 Encounter for antineoplastic radiation therapy: Secondary | ICD-10-CM | POA: Diagnosis not present

## 2014-12-05 NOTE — Progress Notes (Signed)
Rachel Chapman is here for her 21/30 fraction of radiation to her chest. She does report some fatigue, and is napping during the day. She reports she is not drinking as much as she should, but does report eating ok. She reports nausea/ vomiting after eating during the day. Her sitting BP is 115/70, pulse 101 and her standing BP is 68/47, pulse 102. She reports she feels dizzy when standing after radiation, but denies being dizzy at home. She is scheduled for IV fluids in the [facility] tomorrow. Her skin is of normal color to her chest, and not tender per her report.   BP 68/47 mmHg  Pulse 102  Temp(Src) 97.6 F (36.4 C)  Ht '5\' 3"'$  (1.6 m)  Wt 142 lb 6.4 oz (64.592 kg)  BMI 25.23 kg/m2   Wt Readings from Last 3 Encounters:  12/05/14 142 lb 6.4 oz (64.592 kg)  11/30/14 146 lb 12.8 oz (66.588 kg)  11/28/14 147 lb 11.2 oz (66.996 kg)

## 2014-12-05 NOTE — Progress Notes (Signed)
Weekly Management Note:  Outpatient    ICD-9-CM ICD-10-CM   1. Small cell lung cancer, unspecified laterality (HCC) 162.9 C34.90     Current Dose:  42Gy  Projected Dose: 60 Gy   Narrative:  The patient presents for routine under treatment assessment.  CBCT/MVCT images/Port film x-rays were reviewed.  The chart was checked.    Ms. Tieszen is here for her 21/30 fraction of radiation to her chest. She does report some fatigue, and is napping during the day. She reports she is not drinking as much as she should, but does report eating ok. She reports nausea/ vomiting after eating during the day. Her sitting BP is 115/70, pulse 101 and her standing BP is 68/47, pulse 102. She reports she feels dizzy when standing after radiation, but denies being dizzy at home. She is scheduled for IV fluids in the [facility] tomorrow.      Physical Findings:  Wt Readings from Last 3 Encounters:  12/05/14 142 lb 6.4 oz (64.592 kg)  11/30/14 146 lb 12.8 oz (66.588 kg)  11/28/14 147 lb 11.2 oz (66.996 kg)    height is '5\' 3"'$  (1.6 m) and weight is 142 lb 6.4 oz (64.592 kg). Her temperature is 97.6 F (36.4 C). Her blood pressure is 68/47 and her pulse is 102.  NAD, non toxic appearing, ambulatory, no respiratory distress  CBC    Component Value Date/Time   WBC 4.7 11/30/2014 0932   WBC 1.7* 11/25/2014 1412   RBC 2.19* 11/30/2014 0932   RBC 2.39* 11/25/2014 1412   HGB 6.7* 11/30/2014 0932   HGB 7.3* 11/25/2014 1412   HCT 20.4* 11/30/2014 0932   HCT 21.2* 11/25/2014 1412   PLT 192 11/30/2014 0932   PLT 105* 11/25/2014 1412   MCV 93.2 11/30/2014 0932   MCV 88.7 11/25/2014 1412   MCH 30.6 11/30/2014 0932   MCH 30.5 11/25/2014 1412   MCHC 32.8 11/30/2014 0932   MCHC 34.4 11/25/2014 1412   RDW 19.3* 11/30/2014 0932   RDW 15.5 11/25/2014 1412   LYMPHSABS 0.9 11/30/2014 0932   LYMPHSABS 0.6* 11/25/2014 1412   MONOABS 1.2* 11/30/2014 0932   MONOABS 0.7 11/25/2014 1412   EOSABS 0.1 11/30/2014  0932   EOSABS 0.1 11/25/2014 1412   BASOSABS 0.0 11/30/2014 0932   BASOSABS 0.0 11/25/2014 1412     CMP     Component Value Date/Time   NA 137 11/30/2014 0932   NA 133* 11/25/2014 1412   K 4.5 11/30/2014 0932   K 3.9 11/25/2014 1412   CL 100* 11/25/2014 1412   CO2 24 11/30/2014 0932   CO2 26 11/25/2014 1412   GLUCOSE 235* 11/30/2014 0932   GLUCOSE 233* 11/25/2014 1412   BUN 11.4 11/30/2014 0932   BUN 19 11/25/2014 1412   CREATININE 1.0 11/30/2014 0932   CREATININE 1.25* 11/25/2014 1412   CALCIUM 8.6 11/30/2014 0932   CALCIUM 8.6* 11/25/2014 1412   PROT 5.9* 11/30/2014 0932   PROT 6.7 11/25/2014 1412   ALBUMIN 2.9* 11/30/2014 0932   ALBUMIN 3.4* 11/25/2014 1412   AST 12 11/30/2014 0932   AST 21 11/25/2014 1412   ALT 15 11/30/2014 0932   ALT 29 11/25/2014 1412   ALKPHOS 61 11/30/2014 0932   ALKPHOS 63 11/25/2014 1412   BILITOT <0.30 11/30/2014 0932   BILITOT 0.4 11/25/2014 1412   GFRNONAA 41* 11/25/2014 1412   GFRAA 47* 11/25/2014 1412     Impression:  The patient is tolerating radiotherapy.   Plan:  Continue radiotherapy as planned.  IV fluids recommended for today, but patient and family decline.  She was told she is at risk for syncope, and to stand with caution.  Scheduled for IV fluids tomorrow.  Anemia- send another note to med/onc, defer to Dr Irene Limbo.  Discussed PCI with patient/family as possibility in future.    -----------------------------------  [name], MD

## 2014-12-06 ENCOUNTER — Ambulatory Visit (HOSPITAL_BASED_OUTPATIENT_CLINIC_OR_DEPARTMENT_OTHER): Payer: Medicare Other

## 2014-12-06 ENCOUNTER — Other Ambulatory Visit (HOSPITAL_BASED_OUTPATIENT_CLINIC_OR_DEPARTMENT_OTHER): Payer: Medicare Other

## 2014-12-06 ENCOUNTER — Ambulatory Visit: Payer: Medicare Other

## 2014-12-06 ENCOUNTER — Ambulatory Visit
Admission: RE | Admit: 2014-12-06 | Discharge: 2014-12-06 | Disposition: A | Discharge status: Home / Self Care | Payer: Medicare Other | Source: Ambulatory Visit | Attending: Radiation Oncology | Admitting: Radiation Oncology

## 2014-12-06 VITALS — BP 140/68 | HR 103 | Temp 97.0°F | Resp 22

## 2014-12-06 DIAGNOSIS — C3490 Malignant neoplasm of unspecified part of unspecified bronchus or lung: Secondary | ICD-10-CM | POA: Diagnosis present

## 2014-12-06 DIAGNOSIS — D649 Anemia, unspecified: Secondary | ICD-10-CM

## 2014-12-06 DIAGNOSIS — D6481 Anemia due to antineoplastic chemotherapy: Secondary | ICD-10-CM | POA: Diagnosis not present

## 2014-12-06 DIAGNOSIS — E039 Hypothyroidism, unspecified: Secondary | ICD-10-CM

## 2014-12-06 DIAGNOSIS — E860 Dehydration: Secondary | ICD-10-CM | POA: Diagnosis present

## 2014-12-06 DIAGNOSIS — Z510 Encounter for antineoplastic radiation therapy: Secondary | ICD-10-CM | POA: Diagnosis not present

## 2014-12-06 DIAGNOSIS — C781 Secondary malignant neoplasm of mediastinum: Secondary | ICD-10-CM

## 2014-12-06 DIAGNOSIS — E119 Type 2 diabetes mellitus without complications: Secondary | ICD-10-CM | POA: Diagnosis not present

## 2014-12-06 LAB — CBC & DIFF AND RETIC
BASO%: 0.4 % (ref 0.0–2.0)
BASOS ABS: 0 10*3/uL (ref 0.0–0.1)
EOS%: 0.2 % (ref 0.0–7.0)
Eosinophils Absolute: 0 10*3/uL (ref 0.0–0.5)
HEMATOCRIT: 29.3 % — AB (ref 34.8–46.6)
HGB: 10 g/dL — ABNORMAL LOW (ref 11.6–15.9)
Immature Retic Fract: 0.7 % — ABNORMAL LOW (ref 1.60–10.00)
LYMPH%: 9.9 % — ABNORMAL LOW (ref 14.0–49.7)
MCH: 30.5 pg (ref 25.1–34.0)
MCHC: 34.1 g/dL (ref 31.5–36.0)
MCV: 89.3 fL (ref 79.5–101.0)
MONO#: 0.1 10*3/uL (ref 0.1–0.9)
MONO%: 1.2 % (ref 0.0–14.0)
NEUT#: 4.5 10*3/uL (ref 1.5–6.5)
NEUT%: 88.3 % — AB (ref 38.4–76.8)
PLATELETS: 174 10*3/uL (ref 145–400)
RBC: 3.28 10*6/uL — ABNORMAL LOW (ref 3.70–5.45)
RDW: 16.7 % — AB (ref 11.2–14.5)
Retic %: 0.46 % — ABNORMAL LOW (ref 0.70–2.10)
Retic Ct Abs: 15.09 10*3/uL — ABNORMAL LOW (ref 33.70–90.70)
WBC: 5 10*3/uL (ref 3.9–10.3)
lymph#: 0.5 10*3/uL — ABNORMAL LOW (ref 0.9–3.3)

## 2014-12-06 LAB — COMPREHENSIVE METABOLIC PANEL (CC13)
ALT: 13 U/L (ref 0–55)
ANION GAP: 7 meq/L (ref 3–11)
AST: 9 U/L (ref 5–34)
Albumin: 3.3 g/dL — ABNORMAL LOW (ref 3.5–5.0)
Alkaline Phosphatase: 61 U/L (ref 40–150)
BUN: 34.4 mg/dL — ABNORMAL HIGH (ref 7.0–26.0)
CALCIUM: 9 mg/dL (ref 8.4–10.4)
CHLORIDE: 102 meq/L (ref 98–109)
CO2: 25 mEq/L (ref 22–29)
CREATININE: 1.1 mg/dL (ref 0.6–1.1)
EGFR: 49 mL/min/{1.73_m2} — AB (ref >90)
Glucose: 207 mg/dl — ABNORMAL HIGH (ref 70–140)
POTASSIUM: 4.6 meq/L (ref 3.5–5.1)
Sodium: 133 mEq/L — ABNORMAL LOW (ref 136–145)
Total Bilirubin: 0.68 mg/dL (ref 0.20–1.20)
Total Protein: 6.4 g/dL (ref 6.4–8.3)

## 2014-12-06 LAB — MAGNESIUM (CC13): MAGNESIUM: 2 mg/dL (ref 1.5–2.5)

## 2014-12-06 MED ORDER — SODIUM CHLORIDE 0.9 % IV SOLN
Freq: Once | INTRAVENOUS | Status: AC
  Administered 2014-12-06: 13:00:00 via INTRAVENOUS

## 2014-12-06 MED ORDER — SODIUM CHLORIDE 0.9 % IJ SOLN
10.0000 mL | INTRAMUSCULAR | Status: DC | PRN
  Administered 2014-12-06: 10 mL
  Filled 2014-12-06: qty 10

## 2014-12-06 MED ORDER — HEPARIN SOD (PORK) LOCK FLUSH 100 UNIT/ML IV SOLN
500.0000 [IU] | Freq: Once | INTRAVENOUS | Status: AC | PRN
  Administered 2014-12-06: 500 [IU]
  Filled 2014-12-06: qty 5

## 2014-12-06 NOTE — Patient Instructions (Signed)

## 2014-12-07 ENCOUNTER — Ambulatory Visit: Payer: Medicare Other

## 2014-12-07 ENCOUNTER — Ambulatory Visit
Admission: RE | Admit: 2014-12-07 | Discharge: 2014-12-07 | Disposition: A | Discharge status: Home / Self Care | Payer: Medicare Other | Source: Ambulatory Visit | Attending: Radiation Oncology | Admitting: Radiation Oncology

## 2014-12-07 DIAGNOSIS — Z510 Encounter for antineoplastic radiation therapy: Secondary | ICD-10-CM | POA: Diagnosis not present

## 2014-12-08 ENCOUNTER — Encounter: Payer: Self-pay | Admitting: Podiatry

## 2014-12-08 ENCOUNTER — Ambulatory Visit: Payer: Medicare Other

## 2014-12-08 ENCOUNTER — Ambulatory Visit (INDEPENDENT_AMBULATORY_CARE_PROVIDER_SITE_OTHER): Payer: Medicare Other | Admitting: Podiatry

## 2014-12-08 ENCOUNTER — Ambulatory Visit
Admission: RE | Admit: 2014-12-08 | Discharge: 2014-12-08 | Disposition: A | Discharge status: Home / Self Care | Payer: Medicare Other | Source: Ambulatory Visit | Attending: Radiation Oncology | Admitting: Radiation Oncology

## 2014-12-08 VITALS — BP 132/75 | HR 109 | Resp 12 | Ht 62.0 in | Wt 145.0 lb

## 2014-12-08 DIAGNOSIS — M79673 Pain in unspecified foot: Secondary | ICD-10-CM

## 2014-12-08 DIAGNOSIS — B351 Tinea unguium: Secondary | ICD-10-CM

## 2014-12-08 DIAGNOSIS — Z510 Encounter for antineoplastic radiation therapy: Secondary | ICD-10-CM | POA: Diagnosis not present

## 2014-12-08 DIAGNOSIS — E119 Type 2 diabetes mellitus without complications: Secondary | ICD-10-CM

## 2014-12-08 DIAGNOSIS — M79609 Pain in unspecified limb: Principal | ICD-10-CM

## 2014-12-08 NOTE — Progress Notes (Addendum)
   Subjective:    Patient ID: Rachel Chapman, female    DOB: 01-Dec-1940, 74 y.o.   MRN: [id number]  HPI This patient presents to the office for treatment of her nails.  She says she has moved from [facility] for treatment and her nails are painful walking and wearing her shoes.  She is diabetic with no foot complications.  She presents for preventive foot care services. The patient presents here today for B/L toenail debridement..    Review of Systems  HENT: Positive for hearing loss.   Eyes: Positive for visual disturbance.  Respiratory: Positive for cough, shortness of breath and wheezing.   Gastrointestinal: Positive for nausea, vomiting, diarrhea and constipation.  Musculoskeletal: Positive for gait problem.  Neurological: Positive for dizziness and light-headedness.       Objective:   Physical Exam GENERAL APPEARANCE: Alert, conversant. Appropriately groomed. No acute distress.  VASCULAR: Pedal pulses palpable at  [facility] and PT bilateral.  Capillary refill time is immediate to all digits,  Normal temperature gradient.  Digital hair growth is present bilateral  NEUROLOGIC: sensation is normal to 5.07 monofilament at 5/5 sites bilateral.  Light touch is intact bilateral, Muscle strength normal.  MUSCULOSKELETAL: acceptable muscle strength, tone and stability bilateral.  Intrinsic muscluature intact bilateral.  Rectus appearance of foot and digits noted bilateral.   DERMATOLOGIC: skin color, texture, and turgor are within normal limits.  No preulcerative lesions or ulcers  are seen, no interdigital maceration noted.  No open lesions present.  . No drainage noted.  Nails  Thick disfigured discolored nails both feet with no infections or ulcers.        Assessment & Plan:  Onychomycosis  IE  Debridement and grinding of long thick nails.

## 2014-12-09 ENCOUNTER — Ambulatory Visit
Admission: RE | Admit: 2014-12-09 | Discharge: 2014-12-09 | Disposition: A | Discharge status: Home / Self Care | Payer: Medicare Other | Source: Ambulatory Visit | Attending: Radiation Oncology | Admitting: Radiation Oncology

## 2014-12-09 ENCOUNTER — Ambulatory Visit (HOSPITAL_BASED_OUTPATIENT_CLINIC_OR_DEPARTMENT_OTHER): Payer: Medicare Other

## 2014-12-09 ENCOUNTER — Ambulatory Visit: Payer: Medicare Other

## 2014-12-09 VITALS — BP 132/62 | HR 103 | Temp 98.3°F | Resp 18

## 2014-12-09 DIAGNOSIS — C3490 Malignant neoplasm of unspecified part of unspecified bronchus or lung: Secondary | ICD-10-CM | POA: Diagnosis not present

## 2014-12-09 DIAGNOSIS — E860 Dehydration: Secondary | ICD-10-CM

## 2014-12-09 DIAGNOSIS — R110 Nausea: Secondary | ICD-10-CM

## 2014-12-09 MED ORDER — HEPARIN SOD (PORK) LOCK FLUSH 100 UNIT/ML IV SOLN
500.0000 [IU] | Freq: Once | INTRAVENOUS | Status: AC | PRN
  Administered 2014-12-09: 500 [IU]
  Filled 2014-12-09: qty 5

## 2014-12-09 MED ORDER — SODIUM CHLORIDE 0.9 % IV SOLN
Freq: Once | INTRAVENOUS | Status: AC
  Administered 2014-12-09: 13:00:00 via INTRAVENOUS
  Filled 2014-12-09: qty 4

## 2014-12-09 MED ORDER — SODIUM CHLORIDE 0.9 % IV SOLN
Freq: Once | INTRAVENOUS | Status: AC
  Administered 2014-12-09: 13:00:00 via INTRAVENOUS

## 2014-12-09 MED ORDER — ONDANSETRON HCL 4 MG/2ML IJ SOLN: 8.0000 mg | INTRAMUSCULAR | Status: DC

## 2014-12-09 MED ORDER — SODIUM CHLORIDE 0.9 % IJ SOLN
10.0000 mL | INTRAMUSCULAR | Status: DC | PRN
  Administered 2014-12-09: 10 mL
  Filled 2014-12-09: qty 10

## 2014-12-09 NOTE — Patient Instructions (Signed)

## 2014-12-12 ENCOUNTER — Ambulatory Visit (HOSPITAL_BASED_OUTPATIENT_CLINIC_OR_DEPARTMENT_OTHER): Payer: Medicare Other

## 2014-12-12 ENCOUNTER — Encounter: Payer: Self-pay | Admitting: Radiation Oncology

## 2014-12-12 ENCOUNTER — Telehealth: Payer: Self-pay | Admitting: *Deleted

## 2014-12-12 ENCOUNTER — Ambulatory Visit: Payer: Medicare Other

## 2014-12-12 ENCOUNTER — Inpatient Hospital Stay (HOSPITAL_COMMUNITY)
Admission: AD | Admit: 2014-12-12 | Discharge: 2014-12-14 | DRG: 314 | Disposition: A | Discharge status: Home / Self Care | Payer: Medicare Other | Source: Ambulatory Visit | Attending: Internal Medicine | Admitting: Internal Medicine

## 2014-12-12 ENCOUNTER — Ambulatory Visit
Admission: RE | Admit: 2014-12-12 | Discharge: 2014-12-12 | Disposition: A | Discharge status: Home / Self Care | Payer: Medicare Other | Source: Ambulatory Visit | Attending: Radiation Oncology | Admitting: Radiation Oncology

## 2014-12-12 ENCOUNTER — Encounter (HOSPITAL_COMMUNITY): Payer: Self-pay | Admitting: *Deleted

## 2014-12-12 ENCOUNTER — Other Ambulatory Visit (HOSPITAL_BASED_OUTPATIENT_CLINIC_OR_DEPARTMENT_OTHER): Payer: Medicare Other

## 2014-12-12 ENCOUNTER — Ambulatory Visit (HOSPITAL_BASED_OUTPATIENT_CLINIC_OR_DEPARTMENT_OTHER): Payer: Medicare Other | Admitting: Nurse Practitioner

## 2014-12-12 ENCOUNTER — Encounter: Payer: Self-pay | Admitting: General Practice

## 2014-12-12 VITALS — BP 94/41 | HR 101 | Temp 98.4°F

## 2014-12-12 VITALS — BP 92/31 | HR 102 | Ht 62.0 in | Wt 140.2 lb

## 2014-12-12 VITALS — BP 149/68 | HR 103 | Temp 98.6°F | Resp 16

## 2014-12-12 DIAGNOSIS — G893 Neoplasm related pain (acute) (chronic): Secondary | ICD-10-CM

## 2014-12-12 DIAGNOSIS — C342 Malignant neoplasm of middle lobe, bronchus or lung: Secondary | ICD-10-CM | POA: Diagnosis present

## 2014-12-12 DIAGNOSIS — D61810 Antineoplastic chemotherapy induced pancytopenia: Secondary | ICD-10-CM | POA: Diagnosis present

## 2014-12-12 DIAGNOSIS — I959 Hypotension, unspecified: Principal | ICD-10-CM | POA: Diagnosis present

## 2014-12-12 DIAGNOSIS — Z8701 Personal history of pneumonia (recurrent): Secondary | ICD-10-CM | POA: Diagnosis not present

## 2014-12-12 DIAGNOSIS — Z452 Encounter for adjustment and management of vascular access device: Secondary | ICD-10-CM | POA: Diagnosis present

## 2014-12-12 DIAGNOSIS — I951 Orthostatic hypotension: Secondary | ICD-10-CM

## 2014-12-12 DIAGNOSIS — T451X5A Adverse effect of antineoplastic and immunosuppressive drugs, initial encounter: Secondary | ICD-10-CM | POA: Diagnosis present

## 2014-12-12 DIAGNOSIS — E860 Dehydration: Secondary | ICD-10-CM

## 2014-12-12 DIAGNOSIS — Z801 Family history of malignant neoplasm of trachea, bronchus and lung: Secondary | ICD-10-CM

## 2014-12-12 DIAGNOSIS — Z833 Family history of diabetes mellitus: Secondary | ICD-10-CM

## 2014-12-12 DIAGNOSIS — C3490 Malignant neoplasm of unspecified part of unspecified bronchus or lung: Secondary | ICD-10-CM

## 2014-12-12 DIAGNOSIS — R112 Nausea with vomiting, unspecified: Secondary | ICD-10-CM | POA: Diagnosis not present

## 2014-12-12 DIAGNOSIS — E039 Hypothyroidism, unspecified: Secondary | ICD-10-CM | POA: Diagnosis present

## 2014-12-12 DIAGNOSIS — D649 Anemia, unspecified: Secondary | ICD-10-CM | POA: Insufficient documentation

## 2014-12-12 DIAGNOSIS — Z800 Family history of malignant neoplasm of digestive organs: Secondary | ICD-10-CM | POA: Diagnosis not present

## 2014-12-12 DIAGNOSIS — I10 Essential (primary) hypertension: Secondary | ICD-10-CM | POA: Diagnosis present

## 2014-12-12 DIAGNOSIS — Z79899 Other long term (current) drug therapy: Secondary | ICD-10-CM

## 2014-12-12 DIAGNOSIS — R531 Weakness: Secondary | ICD-10-CM | POA: Diagnosis present

## 2014-12-12 DIAGNOSIS — Y842 Radiological procedure and radiotherapy as the cause of abnormal reaction of the patient, or of later complication, without mention of misadventure at the time of the procedure: Secondary | ICD-10-CM | POA: Diagnosis present

## 2014-12-12 DIAGNOSIS — E119 Type 2 diabetes mellitus without complications: Secondary | ICD-10-CM | POA: Diagnosis present

## 2014-12-12 DIAGNOSIS — R627 Adult failure to thrive: Secondary | ICD-10-CM | POA: Diagnosis present

## 2014-12-12 DIAGNOSIS — Z87891 Personal history of nicotine dependence: Secondary | ICD-10-CM | POA: Diagnosis not present

## 2014-12-12 DIAGNOSIS — F419 Anxiety disorder, unspecified: Secondary | ICD-10-CM | POA: Diagnosis present

## 2014-12-12 DIAGNOSIS — K208 Other esophagitis without bleeding: Secondary | ICD-10-CM | POA: Diagnosis present

## 2014-12-12 DIAGNOSIS — R110 Nausea: Secondary | ICD-10-CM | POA: Diagnosis not present

## 2014-12-12 DIAGNOSIS — Z6825 Body mass index (BMI) 25.0-25.9, adult: Secondary | ICD-10-CM | POA: Diagnosis not present

## 2014-12-12 DIAGNOSIS — E43 Unspecified severe protein-calorie malnutrition: Secondary | ICD-10-CM | POA: Diagnosis present

## 2014-12-12 DIAGNOSIS — T66XXXA Radiation sickness, unspecified, initial encounter: Secondary | ICD-10-CM | POA: Diagnosis present

## 2014-12-12 DIAGNOSIS — I9589 Other hypotension: Secondary | ICD-10-CM | POA: Diagnosis not present

## 2014-12-12 DIAGNOSIS — Z95828 Presence of other vascular implants and grafts: Secondary | ICD-10-CM

## 2014-12-12 DIAGNOSIS — E871 Hypo-osmolality and hyponatremia: Secondary | ICD-10-CM | POA: Diagnosis present

## 2014-12-12 DIAGNOSIS — E785 Hyperlipidemia, unspecified: Secondary | ICD-10-CM | POA: Diagnosis present

## 2014-12-12 LAB — CBC WITH DIFFERENTIAL/PLATELET
BASO%: 1.3 % (ref 0.0–2.0)
Basophils Absolute: 0 10*3/uL (ref 0.0–0.1)
EOS%: 4 % (ref 0.0–7.0)
Eosinophils Absolute: 0 10*3/uL (ref 0.0–0.5)
HEMATOCRIT: 23.2 % — AB (ref 34.8–46.6)
HGB: 8 g/dL — ABNORMAL LOW (ref 11.6–15.9)
LYMPH%: 25.3 % (ref 14.0–49.7)
MCH: 30 pg (ref 25.1–34.0)
MCHC: 34.5 g/dL (ref 31.5–36.0)
MCV: 86.9 fL (ref 79.5–101.0)
MONO#: 0.2 10*3/uL (ref 0.1–0.9)
MONO%: 22.7 % — ABNORMAL HIGH (ref 0.0–14.0)
NEUT#: 0.4 10*3/uL — CL (ref 1.5–6.5)
NEUT%: 46.7 % (ref 38.4–76.8)
Platelets: 34 10*3/uL — ABNORMAL LOW (ref 145–400)
RBC: 2.67 10*6/uL — ABNORMAL LOW (ref 3.70–5.45)
RDW: 15.8 % — ABNORMAL HIGH (ref 11.2–14.5)
WBC: 0.8 10*3/uL — AB (ref 3.9–10.3)
lymph#: 0.2 10*3/uL — ABNORMAL LOW (ref 0.9–3.3)
nRBC: 0 % (ref 0–0)

## 2014-12-12 LAB — COMPREHENSIVE METABOLIC PANEL (CC13)
ALT: 9 U/L (ref 0–55)
AST: 8 U/L (ref 5–34)
Albumin: 3.2 g/dL — ABNORMAL LOW (ref 3.5–5.0)
Alkaline Phosphatase: 62 U/L (ref 40–150)
Anion Gap: 7 mEq/L (ref 3–11)
BUN: 20.6 mg/dL (ref 7.0–26.0)
CALCIUM: 9.5 mg/dL (ref 8.4–10.4)
CHLORIDE: 101 meq/L (ref 98–109)
CO2: 26 mEq/L (ref 22–29)
Creatinine: 1 mg/dL (ref 0.6–1.1)
EGFR: 52 mL/min/{1.73_m2} — ABNORMAL LOW (ref >90)
Glucose: 172 mg/dl — ABNORMAL HIGH (ref 70–140)
POTASSIUM: 4.5 meq/L (ref 3.5–5.1)
Sodium: 134 mEq/L — ABNORMAL LOW (ref 136–145)
Total Bilirubin: 0.72 mg/dL (ref 0.20–1.20)
Total Protein: 6.4 g/dL (ref 6.4–8.3)

## 2014-12-12 LAB — PREPARE RBC (CROSSMATCH)

## 2014-12-12 LAB — GLUCOSE, CAPILLARY: GLUCOSE-CAPILLARY: 135 mg/dL — AB (ref 65–99)

## 2014-12-12 MED ORDER — ACETAMINOPHEN 325 MG PO TABS: 650.0000 mg | ORAL_TABLET | Freq: Once | ORAL | Status: DC

## 2014-12-12 MED ORDER — MAGIC MOUTHWASH W/LIDOCAINE
5.0000 mL | Freq: Three times a day (TID) | ORAL | Status: DC
  Administered 2014-12-13 – 2014-12-14 (×5): 5 mL via ORAL
  Filled 2014-12-12 (×9): qty 5

## 2014-12-12 MED ORDER — MORPHINE SULFATE 4 MG/ML IJ SOLN
2.0000 mg | Freq: Once | INTRAMUSCULAR | Status: AC
  Administered 2014-12-12: 2 mg via INTRAVENOUS
  Filled 2014-12-12: qty 1

## 2014-12-12 MED ORDER — SUCRALFATE 1 GM/10ML PO SUSP
1.0000 g | Freq: Three times a day (TID) | ORAL | Status: DC
  Administered 2014-12-13 – 2014-12-14 (×6): 1 g via ORAL
  Filled 2014-12-12 (×9): qty 10

## 2014-12-12 MED ORDER — ALBUTEROL SULFATE (2.5 MG/3ML) 0.083% IN NEBU: 2.5000 mg | INHALATION_SOLUTION | Freq: Four times a day (QID) | RESPIRATORY_TRACT | Status: DC | PRN

## 2014-12-12 MED ORDER — OXYCODONE HCL 5 MG/5ML PO SOLN
5.0000 mg | ORAL | Status: DC | PRN
  Filled 2014-12-12: qty 5

## 2014-12-12 MED ORDER — OXYCODONE-ACETAMINOPHEN 5-325 MG PO TABS
ORAL_TABLET | ORAL | Status: AC
  Filled 2014-12-12: qty 1

## 2014-12-12 MED ORDER — SODIUM CHLORIDE 0.9 % IV SOLN
Freq: Once | INTRAVENOUS | Status: AC
  Administered 2014-12-12: 15:00:00 via INTRAVENOUS

## 2014-12-12 MED ORDER — MORPHINE SULFATE (PF) 10 MG/ML IV SOLN
INTRAVENOUS | Status: AC
  Filled 2014-12-12: qty 1

## 2014-12-12 MED ORDER — MORPHINE SULFATE (PF) 4 MG/ML IV SOLN
INTRAVENOUS | Status: AC
  Filled 2014-12-12: qty 1

## 2014-12-12 MED ORDER — VITAMIN B-12 1000 MCG PO TABS
1000.0000 ug | ORAL_TABLET | Freq: Every day | ORAL | Status: DC
  Administered 2014-12-13 – 2014-12-14 (×2): 1000 ug via ORAL
  Filled 2014-12-12 (×2): qty 1

## 2014-12-12 MED ORDER — SODIUM CHLORIDE 0.9 % IV SOLN
INTRAVENOUS | Status: DC
  Administered 2014-12-12: 17:00:00 via INTRAVENOUS

## 2014-12-12 MED ORDER — SODIUM CHLORIDE 0.9 % IJ SOLN: 10.0000 mL | INTRAMUSCULAR | Status: DC | PRN

## 2014-12-12 MED ORDER — INSULIN ASPART 100 UNIT/ML ~~LOC~~ SOLN: 0.0000 [IU] | Freq: Three times a day (TID) | SUBCUTANEOUS | Status: DC

## 2014-12-12 MED ORDER — SIMVASTATIN 20 MG PO TABS
20.0000 mg | ORAL_TABLET | Freq: Every day | ORAL | Status: DC
  Administered 2014-12-13: 20 mg via ORAL
  Filled 2014-12-12 (×2): qty 1

## 2014-12-12 MED ORDER — CETYLPYRIDINIUM CHLORIDE 0.05 % MT LIQD
7.0000 mL | Freq: Two times a day (BID) | OROMUCOSAL | Status: DC
  Administered 2014-12-13 – 2014-12-14 (×3): 7 mL via OROMUCOSAL

## 2014-12-12 MED ORDER — ONDANSETRON 4 MG PO TBDP: 4.0000 mg | ORAL_TABLET | Freq: Three times a day (TID) | ORAL | Status: DC | PRN

## 2014-12-12 MED ORDER — LORAZEPAM 0.5 MG PO TABS: 0.5000 mg | ORAL_TABLET | Freq: Three times a day (TID) | ORAL | Status: DC | PRN

## 2014-12-12 MED ORDER — SODIUM CHLORIDE 0.9 % IJ SOLN
10.0000 mL | INTRAMUSCULAR | Status: DC | PRN
  Administered 2014-12-12: 10 mL via INTRAVENOUS
  Filled 2014-12-12: qty 10

## 2014-12-12 MED ORDER — CHLORHEXIDINE GLUCONATE 0.12 % MT SOLN
15.0000 mL | Freq: Two times a day (BID) | OROMUCOSAL | Status: DC
  Administered 2014-12-13 – 2014-12-14 (×3): 15 mL via OROMUCOSAL
  Filled 2014-12-12 (×4): qty 15

## 2014-12-12 MED ORDER — HEPARIN SOD (PORK) LOCK FLUSH 100 UNIT/ML IV SOLN
500.0000 [IU] | Freq: Every day | INTRAVENOUS | Status: DC | PRN
  Filled 2014-12-12: qty 5

## 2014-12-12 MED ORDER — SODIUM CHLORIDE 0.9 % IV SOLN
Freq: Once | INTRAVENOUS | Status: AC
  Administered 2014-12-12: 16:00:00 via INTRAVENOUS
  Filled 2014-12-12: qty 4

## 2014-12-12 MED ORDER — LEVOTHYROXINE SODIUM 112 MCG PO TABS
112.0000 ug | ORAL_TABLET | Freq: Two times a day (BID) | ORAL | Status: DC
  Administered 2014-12-13 – 2014-12-14 (×3): 112 ug via ORAL
  Filled 2014-12-12 (×4): qty 1

## 2014-12-12 MED ORDER — SODIUM CHLORIDE 0.9 % IV SOLN
INTRAVENOUS | Status: DC
  Administered 2014-12-13 (×3): via INTRAVENOUS

## 2014-12-12 MED ORDER — SODIUM CHLORIDE 0.9 % IJ SOLN
3.0000 mL | INTRAMUSCULAR | Status: AC | PRN
  Administered 2014-12-12: 3 mL

## 2014-12-12 MED ORDER — SENNOSIDES-DOCUSATE SODIUM 8.6-50 MG PO TABS: 2.0000 | ORAL_TABLET | Freq: Every evening | ORAL | Status: DC | PRN

## 2014-12-12 MED ORDER — OXYCODONE-ACETAMINOPHEN 5-325 MG PO TABS: 1.0000 | ORAL_TABLET | Freq: Once | ORAL | Status: DC

## 2014-12-12 MED ORDER — CITALOPRAM HYDROBROMIDE 10 MG PO TABS
10.0000 mg | ORAL_TABLET | Freq: Every day | ORAL | Status: DC
  Administered 2014-12-13 – 2014-12-14 (×2): 10 mg via ORAL
  Filled 2014-12-12 (×2): qty 1

## 2014-12-12 MED ORDER — GI COCKTAIL ~~LOC~~
30.0000 mL | Freq: Three times a day (TID) | ORAL | Status: DC | PRN
  Administered 2014-12-13 – 2014-12-14 (×3): 30 mL via ORAL
  Filled 2014-12-12 (×5): qty 30

## 2014-12-12 MED ORDER — MORPHINE SULFATE (PF) 4 MG/ML IV SOLN
4.0000 mg | INTRAVENOUS | Status: DC | PRN
  Administered 2014-12-12 – 2014-12-14 (×4): 4 mg via INTRAVENOUS
  Filled 2014-12-12 (×4): qty 1

## 2014-12-12 MED ORDER — HEPARIN SOD (PORK) LOCK FLUSH 100 UNIT/ML IV SOLN
250.0000 [IU] | INTRAVENOUS | Status: DC | PRN
  Filled 2014-12-12: qty 3

## 2014-12-12 MED ORDER — SODIUM CHLORIDE 0.9 % IV SOLN
250.0000 mL | Freq: Once | INTRAVENOUS | Status: AC
  Administered 2014-12-12: 250 mL via INTRAVENOUS

## 2014-12-12 MED ORDER — SODIUM CHLORIDE 0.9 % IV SOLN
4.0000 mg | Freq: Four times a day (QID) | INTRAVENOUS | Status: DC | PRN
  Filled 2014-12-12: qty 2

## 2014-12-12 NOTE — Patient Instructions (Signed)

## 2014-12-12 NOTE — Progress Notes (Signed)
Rachel Chapman is here for her 26th fraction of radiation to her Chest. She reports feeling very badly today. She has not been able to eat or drink anything for several days. She reports it is because of severe pain (10/10) all the time, and worse when swallowing. She is using her carafate, but is not able to keep it down, all the time.  She also complains of nausea, vomiting whenever she does swallow, but cannot relate it to the pain, or if it is just nausea. She reports dizziness when standing, and having no energy for any activities at this time.   Othostatics: sitting BP 92/31, pulse 102 and standing 50/35, pulse 108.  BP 92/31 mmHg  Pulse 102  Ht '5\' 2"'$  (1.575 m)  Wt 140 lb 3.2 oz (63.594 kg)  BMI 25.64 kg/m2  SpO2 98%   Wt Readings from Last 3 Encounters:  12/12/14 140 lb 3.2 oz (63.594 kg)  12/08/14 145 lb (65.772 kg)  12/05/14 142 lb 6.4 oz (64.592 kg)     .

## 2014-12-12 NOTE — Progress Notes (Signed)
Report given to [facility] on 4E,  Pt taken by W/C w/ son to room 1406 at 1830 in stable condition.

## 2014-12-12 NOTE — Telephone Encounter (Signed)
err

## 2014-12-12 NOTE — Progress Notes (Signed)
Weekly Management Note:  Outpatient    ICD-9-CM ICD-10-CM   1. 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  CBCT/MVCT images/Port film x-rays were reviewed.  The chart was checked.     Ms. Rachel Chapman is here after  her 26th fraction of radiation to her Chest. She reports feeling very badly today. She has not been able to eat or drink anything for several days. She reports it is because of severe pain (10/10) all the time, and worse when swallowing. She is using her carafate, but is not able to keep it down, all the time.  She also complains of nausea, vomiting whenever she does swallow, but cannot relate it to the pain, or if it is just nausea. She reports dizziness when standing, and having no energy for any activities at this time. She reports hiccups.  She has no narcotics  and lidocaine is not helpful   Othostatics: sitting BP 92/31, pulse 102 and standing 50/35, pulse 108.    Physical Findings:  Wt Readings from Last 3 Encounters:  12/12/14 140 lb 3.2 oz (63.594 kg)  12/08/14 145 lb (65.772 kg)  12/05/14 142 lb 6.4 oz (64.592 kg)    height is '5\' 2"'$  (1.575 m) and weight is 140 lb 3.2 oz (63.594 kg). Her blood pressure is 92/31 and her pulse is 102. Her oxygen saturation is 98%.  Orthostatic; NAD, pale, alopecic, skin  intact over back, chest CTAB  CBC    Component Value Date/Time   WBC 5.0 12/06/2014 1242   WBC 1.7* 11/25/2014 1412   RBC 3.28* 12/06/2014 1242   RBC 2.39* 11/25/2014 1412   HGB 10.0* 12/06/2014 1242   HGB 7.3* 11/25/2014 1412   HCT 29.3* 12/06/2014 1242   HCT 21.2* 11/25/2014 1412   PLT 174 12/06/2014 1242   PLT 105* 11/25/2014 1412   MCV 89.3 12/06/2014 1242   MCV 88.7 11/25/2014 1412   MCH 30.5 12/06/2014 1242   MCH 30.5 11/25/2014 1412   MCHC 34.1 12/06/2014 1242   MCHC 34.4 11/25/2014 1412   RDW 16.7* 12/06/2014 1242   RDW 15.5 11/25/2014 1412   LYMPHSABS 0.5* 12/06/2014 1242   LYMPHSABS 0.6* 11/25/2014 1412   MONOABS 0.1 12/06/2014 1242   MONOABS 0.7 11/25/2014 1412   EOSABS 0.0 12/06/2014 1242   EOSABS 0.1 11/25/2014 1412   BASOSABS 0.0 12/06/2014 1242   BASOSABS 0.0 11/25/2014 1412     CMP     Component Value Date/Time   NA 133* 12/06/2014 1242   NA 133* 11/25/2014 1412   K 4.6 12/06/2014 1242   K 3.9 11/25/2014 1412   CL 100* 11/25/2014 1412   CO2 25 12/06/2014 1242   CO2 26 11/25/2014 1412   GLUCOSE 207* 12/06/2014 1242   GLUCOSE 233* 11/25/2014 1412   BUN 34.4* 12/06/2014 1242   BUN 19 11/25/2014 1412   CREATININE 1.1 12/06/2014 1242   CREATININE 1.25* 11/25/2014 1412   CALCIUM 9.0 12/06/2014 1242   CALCIUM 8.6* 11/25/2014 1412   PROT 6.4 12/06/2014 1242   PROT 6.7 11/25/2014 1412   ALBUMIN 3.3* 12/06/2014 1242   ALBUMIN 3.4* 11/25/2014 1412   AST 9 12/06/2014 1242   AST 21 11/25/2014 1412   ALT 13 12/06/2014 1242   ALT 29 11/25/2014 1412   ALKPHOS 61 12/06/2014 1242   ALKPHOS 63 11/25/2014 1412  BILITOT 0.68 12/06/2014 1242   BILITOT 0.4 11/25/2014 1412   GFRNONAA 41* 11/25/2014 1412   GFRAA 47* 11/25/2014 1412     Impression:  The patient is tolerating ChRT with difficulty.  Esophagitis,pain, N/V, hiccups, orthostasis, poor intake, malnutrition  Plan:  Continue radiotherapy as planned.    I discussed issues above with Michel Harrow of medical oncology. Pt will be escorted up to med/onc for management including revision of supportive meds for pain and nausea +/- for hiccups, as well as IV fluids.  She knows that she may need admission.    -----------------------------------  [name], MD

## 2014-12-12 NOTE — Progress Notes (Signed)
Spiritual Care Note  Met with pt, son Rachel Chapman, and their younger daughter Rachel Chapman in the lobby.  Family dynamics continue to be a stressor.  Pt ("Rachel Chapman") was exhausted and complaining of abdominal/esophageal pain, nearly falling asleep in her wheelchair.  This was the lowest, physically and emotionally, that I've seen her.  Continue to follow all family members to provide comforting presence, opportunity to share and process emotionally, spiritual encouragement, and witness to their stories/pain/hopes.  Please also page as needs arise or circumstances change.  Thank you.  [place], [place], [facility] Pager [phone] Voicemail [phone]

## 2014-12-12 NOTE — H&P (Signed)
[facility] History and Physical  Rachel Chapman [id number]:[id number] DOB: [birth year] DOA: 12/12/2014  Referring physician: EDP PCP: [name], NP   Chief Complaint: Generalized weakness   HPI: Rachel Chapman is a 74 y.o. 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  Patient is undergoing chemo and radiation therapy.  Last chemo treatment was ~2 weeks ago per family, last radiation therapy was today.  Patient reported feeling ill today during radiotherapy.  She was running low BPs in the 60s-70s range which improved after 1L IVF bolus.  Lab work revealed pancytopenia, HGB of 8.0, WBC of 0.8, and platelets in the 30s.  Patient is also suffering from poor PO intake due to radiation induced esophagitis.  Patient was sent over from Dr. Grier Mitts office for direct admission.  Review of Systems: Systems reviewed.  As above, otherwise negative  Past Medical History  Diagnosis Date  . Hypertension   . Dyslipidemia   . Diabetes ([place])   . Hypothyroidism   . Pneumonia 2007  . Smoker     1.5 packs per day for 60 years.  Quit one month ago  . Anxiety   . Cancer ([place])   . Lung cancer ([place]) 8/216    hilar/mediastinum lung scca   Past Surgical History  Procedure Laterality Date  . Cesarean section     Social History:  reports that she quit smoking about 2 months ago. She does not have any smokeless tobacco history on file. She reports that she does not drink alcohol or use illicit drugs.  No Known Allergies  Family History  Problem Relation Age of Onset  . Clotting disorder Neg Hx   . Cancer Mother     lung non smoker  . Cancer Sister     colon  . Diabetes Sister   . Diabetes Mother      Prior to Admission medications   Medication Sig Start Date End Date Taking? Authorizing Provider  albuterol (PROVENTIL HFA;VENTOLIN HFA) 108 (90 BASE) MCG/ACT inhaler Inhale 2 puffs into the lungs every 6 (six) hours as needed for shortness of breath. 11/01/14   [name], NP   cholecalciferol (VITAMIN D) 1000 UNITS tablet Take 1 tablet (1,000 Units total) by mouth daily. 11/01/14   [name], NP  citalopram (CELEXA) 10 MG tablet Take 1 tablet (10 mg total) by mouth daily. 11/01/14   [name], NP  emollient (BIAFINE) cream Apply topically as needed. 11/10/14   [name], MD  glucose blood [facility] VERIO) test strip Test once daily. Patient taking differently: 1 each by Other route. One to two times daily, 11/15/14   [name], NP  levothyroxine (SYNTHROID, LEVOTHROID) 112 MCG tablet Take 1 tablet (112 mcg total) by mouth 2 (two) times daily. 11/01/14   [name], NP  lidocaine-prilocaine (EMLA) cream Apply topically once. 10/20/14   [name], MD  lisinopril (PRINIVIL,ZESTRIL) 10 MG tablet Take 1 tablet (10 mg total) by mouth daily. 11/01/14   [name], NP  LORazepam (ATIVAN) 0.5 MG tablet Take 1 tablet (0.5 mg total) by mouth every 8 (eight) hours as needed for anxiety (or nausea). Could take 2 tab 30-45 mins prior to PET/CT and MRI Brain 11/09/14   [name], MD  magic mouthwash w/lidocaine SOLN Take 5 mLs by mouth 4 (four) times daily as needed for mouth pain. 11/09/14   [name], MD  ondansetron (ZOFRAN-ODT) 4 MG disintegrating tablet Take 4 mg by mouth every 8 (eight) hours as needed for  nausea or vomiting.    Historical Provider, MD  Jonetta Speak LANCETS FINE MISC Test once daily. 11/15/14   [name], NP  senna-docusate (SENNA S) 8.6-50 MG tablet Take 2 tablets by mouth at bedtime as needed for mild constipation. 11/09/14   [name], MD  simvastatin (ZOCOR) 20 MG tablet Take 1 tablet (20 mg total) by mouth at bedtime. 11/01/14   [name], NP  sitaGLIPtin (JANUVIA) 100 MG tablet Take 1 tablet (100 mg total) by mouth daily. 11/01/14   [name], NP  sucralfate (CARAFATE) 1 G tablet Dissolve 1 tablet in 10 mL H20 and swallow 5 min before meals 11/21/14   [name], MD  vitamin B-12  (CYANOCOBALAMIN) 1000 MCG tablet Take 1 tablet (1,000 mcg total) by mouth daily. 11/01/14   [name], NP   Physical Exam: There were no vitals filed for this visit.  There were no vitals taken for this visit.  General Appearance:    Alert, oriented, no distress, appears stated age  Head:    Normocephalic, atraumatic  Eyes:    PERRL, EOMI, sclera non-icteric        Nose:   Nares without drainage or epistaxis. Mucosa, turbinates normal  Throat:   Moist mucous membranes. Oropharynx without erythema or exudate.  Neck:   Supple. No carotid bruits.  No thyromegaly.  No lymphadenopathy.   Back:     No CVA tenderness, no spinal tenderness  Lungs:     Clear to auscultation bilaterally, without wheezes, rhonchi or rales  Chest wall:    No tenderness to palpitation  Heart:    Regular rate and rhythm without murmurs, gallops, rubs  Abdomen:     Soft, non-tender, nondistended, normal bowel sounds, no organomegaly  Genitalia:    deferred  Rectal:    deferred  Extremities:   No clubbing, cyanosis or edema.  Pulses:   2+ and symmetric all extremities  Skin:   Skin color, texture, turgor normal, no rashes or lesions  Lymph nodes:   Cervical, supraclavicular, and axillary nodes normal  Neurologic:   CNII-XII intact. Normal strength, sensation and reflexes      throughout    Labs on Admission:  Basic Metabolic Panel:  Recent Labs Lab 12/06/14 1242 12/12/14 1303  NA 133* 134*  K 4.6 4.5  CO2 25 26  GLUCOSE 207* 172*  BUN 34.4* 20.6  CREATININE 1.1 1.0  CALCIUM 9.0 9.5  MG 2.0  --    Liver Function Tests:  Recent Labs Lab 12/06/14 1242 12/12/14 1303  AST 9 8  ALT 13 9  ALKPHOS 61 62  BILITOT 0.68 0.72  PROT 6.4 6.4  ALBUMIN 3.3* 3.2*   No results for input(s): LIPASE, AMYLASE in the last 168 hours. No results for input(s): AMMONIA in the last 168 hours. CBC:  Recent Labs Lab 12/06/14 1242 12/12/14 1303  WBC 5.0 0.8*  NEUTROABS 4.5 0.4*  HGB 10.0* 8.0*  HCT 29.3*  23.2*  MCV 89.3 86.9  PLT 174 34*   Cardiac Enzymes: No results for input(s): CKTOTAL, CKMB, CKMBINDEX, TROPONINI in the last 168 hours.  BNP (last 3 results) No results for input(s): PROBNP in the last 8760 hours. CBG: No results for input(s): GLUCAP in the last 168 hours.  Radiological Exams on Admission: No results found.  EKG: Independently reviewed.  Assessment/Plan Active Problems:   Small cell lung cancer (HCC)   Essential hypertension   Diabetes type 2, controlled (HCC)   Symptomatic anemia  Dehydration   Radiation-induced esophagitis   Hypotension   Antineoplastic chemotherapy induced pancytopenia ([place])   1. Hypotension associated with dehydration - 1. Improved post IVF 2. Plan to continue IVF after transfusion. 3. Hold home BP meds that she takes for chronic HTN 4. Repeat BMP in AM 2. Symptomatic anemia - 1. Dr. Irene Limbo has ordered 2 units PRBC to be transfused 2. Tele monitor 3. Pancytopenia - 1. Repeat CBC in AM 2. Patient and family deny fever, so not febrile neutropenia at this point, will watch for this and treat if this develops 4. DM2 - 1. Hold home [place] 2. Put on moderate scale SSI AC/HS 5. Radiation esophagitis - 1. Morphine PRN 2. GI cocktail PRN    Code Status: Full  Family Communication: Family at bedside Disposition Plan: Admit to inpatient   Time spent: 91 min  GARDNER, JARED M. [facility] Pager [phone]  If 7AM-7PM, please contact the day team taking care of the patient [email or web address] Password [email or web address] 12/12/2014, 7:24 PM

## 2014-12-12 NOTE — Telephone Encounter (Signed)
RadOnc called to request pt to be evaluated and given fluids in [facility] today. Pt hypotensive and generalized weakness. POF entered for labs and [facility] visit

## 2014-12-13 ENCOUNTER — Encounter: Payer: Self-pay | Admitting: Nurse Practitioner

## 2014-12-13 ENCOUNTER — Telehealth: Payer: Self-pay | Admitting: *Deleted

## 2014-12-13 ENCOUNTER — Ambulatory Visit: Payer: Medicare Other

## 2014-12-13 ENCOUNTER — Ambulatory Visit
Admission: RE | Admit: 2014-12-13 | Discharge: 2014-12-13 | Disposition: A | Discharge status: Home / Self Care | Payer: Medicare Other | Source: Ambulatory Visit | Attending: Radiation Oncology | Admitting: Radiation Oncology

## 2014-12-13 DIAGNOSIS — E871 Hypo-osmolality and hyponatremia: Secondary | ICD-10-CM | POA: Diagnosis present

## 2014-12-13 DIAGNOSIS — R112 Nausea with vomiting, unspecified: Secondary | ICD-10-CM

## 2014-12-13 DIAGNOSIS — G893 Neoplasm related pain (acute) (chronic): Secondary | ICD-10-CM | POA: Insufficient documentation

## 2014-12-13 DIAGNOSIS — C342 Malignant neoplasm of middle lobe, bronchus or lung: Secondary | ICD-10-CM

## 2014-12-13 DIAGNOSIS — R110 Nausea: Secondary | ICD-10-CM | POA: Insufficient documentation

## 2014-12-13 DIAGNOSIS — D61810 Antineoplastic chemotherapy induced pancytopenia: Secondary | ICD-10-CM

## 2014-12-13 DIAGNOSIS — I951 Orthostatic hypotension: Secondary | ICD-10-CM | POA: Insufficient documentation

## 2014-12-13 DIAGNOSIS — I9589 Other hypotension: Secondary | ICD-10-CM

## 2014-12-13 DIAGNOSIS — E860 Dehydration: Secondary | ICD-10-CM

## 2014-12-13 DIAGNOSIS — E43 Unspecified severe protein-calorie malnutrition: Secondary | ICD-10-CM | POA: Diagnosis present

## 2014-12-13 LAB — CBC
HEMATOCRIT: 27.9 % — AB (ref 36.0–46.0)
HEMOGLOBIN: 9.9 g/dL — AB (ref 12.0–15.0)
MCH: 30.8 pg (ref 26.0–34.0)
MCHC: 35.5 g/dL (ref 30.0–36.0)
MCV: 86.9 fL (ref 78.0–100.0)
Platelets: 25 10*3/uL — CL (ref 150–400)
RBC: 3.21 MIL/uL — AB (ref 3.87–5.11)
RDW: 14.9 % (ref 11.5–15.5)
WBC: 0.7 10*3/uL — AB (ref 4.0–10.5)

## 2014-12-13 LAB — BASIC METABOLIC PANEL
ANION GAP: 6 (ref 5–15)
BUN: 18 mg/dL (ref 6–20)
CALCIUM: 8.6 mg/dL — AB (ref 8.9–10.3)
CO2: 27 mmol/L (ref 22–32)
Chloride: 101 mmol/L (ref 101–111)
Creatinine, Ser: 0.78 mg/dL (ref 0.44–1.00)
GLUCOSE: 114 mg/dL — AB (ref 65–99)
POTASSIUM: 4 mmol/L (ref 3.5–5.1)
Sodium: 134 mmol/L — ABNORMAL LOW (ref 135–145)

## 2014-12-13 LAB — MAGNESIUM: Magnesium: 1.7 mg/dL (ref 1.7–2.4)

## 2014-12-13 LAB — GLUCOSE, CAPILLARY
GLUCOSE-CAPILLARY: 140 mg/dL — AB (ref 65–99)
GLUCOSE-CAPILLARY: 100 mg/dL — AB (ref 65–99)
GLUCOSE-CAPILLARY: 148 mg/dL — AB (ref 65–99)
Glucose-Capillary: 109 mg/dL — ABNORMAL HIGH (ref 65–99)

## 2014-12-13 MED ORDER — SODIUM CHLORIDE 0.9 % IJ SOLN
10.0000 mL | INTRAMUSCULAR | Status: DC | PRN
Start: 2014-12-13 — End: 2014-12-14
  Administered 2014-12-13: 10 mL
  Filled 2014-12-13: qty 40

## 2014-12-13 MED ORDER — ONDANSETRON 4 MG PO TBDP: 4.0000 mg | ORAL_TABLET | Freq: Three times a day (TID) | ORAL | Status: DC | PRN

## 2014-12-13 MED ORDER — SODIUM CHLORIDE 0.9 % IV SOLN
Freq: Once | INTRAVENOUS | Status: AC
  Administered 2014-12-13: 11:00:00 via INTRAVENOUS

## 2014-12-13 MED ORDER — SODIUM CHLORIDE 0.9 % IJ SOLN
10.0000 mL | Freq: Two times a day (BID) | INTRAMUSCULAR | Status: DC
  Administered 2014-12-13: 10 mL

## 2014-12-13 NOTE — Care Management Note (Signed)
Case Management Note  Patient Details  Name: Rachel Chapman MRN: [id number] Date of Birth: 02/03/1941  Subjective/Objective:74 y/o f admitted w/anemia,hypotension, FTT. From home. PT cons-await recc.                    Action/Plan:d/c plan home.   Expected Discharge Date:                Expected Discharge Plan:  [place]  In-House Referral:     Discharge planning Services  CM Consult  Post Acute Care Choice:    Choice offered to:     DME Arranged:    DME Agency:     HH Arranged:    [place] Agency:     Status of Service:  In process, will continue to follow  Medicare Important Message Given:    Date Medicare IM Given:    Medicare IM give by:    Date Additional Medicare IM Given:    Additional Medicare Important Message give by:     If discussed at [place] of Stay Meetings, dates discussed:    Additional Comments:  Dessa Phi, RN 12/13/2014, 10:58 AM

## 2014-12-13 NOTE — Assessment & Plan Note (Signed)
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  Systolic blood pressure was in the 50s.  Patient states that she used to take blood pressure Lopressor blood pressure medication; but discontinued all blood pressure medication approximately 3 weeks ago.  Patient denies any other new symptoms whatsoever.  Patient was given approximately 1100 mL mls saline IV fluid rehydration while at the cancer Center today.  Patient was still unable to manage oral fluids; despite her blood pressure improving to 9441.  Patient will be admitted to the hospital for further evaluation and management.

## 2014-12-13 NOTE — Telephone Encounter (Signed)
Err

## 2014-12-13 NOTE — Telephone Encounter (Signed)
Call from [place] asking if we are aware this patient is admitted.  Called [facility] #4, notifying them patient is admitted on fourth floor.  Per patient station she is in room 1406-01.

## 2014-12-13 NOTE — Assessment & Plan Note (Signed)
Patient received cycle 3 of her carboplatin/etoposide on 11/30/2014.  Labs obtained today revealed.  ANC is 0.4, hemoglobin 8.0, and platelet count of 34.  Briefly review of all neutropenia guidelines with both patient and family today.  Also, patients hemoglobin is low; and will require a transfusion.  Patient denies any worsening issues with either bleeding or bruising secondary to thrombocytopenia.  Patient will be admitted to the hospital for further evaluation and management today.  See previous notes regarding transfer to the hospital.

## 2014-12-13 NOTE — Progress Notes (Addendum)
Patient ID: Rachel Chapman, female   DOB: [birth year], 74 y.o.   MRN: [id number]  [facility] PROGRESS NOTE  Rachel Chapman [id number]:[id number] DOB: 1940-06-15 DOA: 12/12/2014 PCP: [name], NP   Brief narrative:    74 y.o. 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 Last chemo treatment was ~2 weeks ago, last radiation therapy 10/31. Patient presented from cancer center to [facility] for evaluation of progressive weakness and failure to thrive. At the cancer center, pt had SBP in 60-70's requiring 1 L NS, blood work was notable for thrombocytopenia and [facility] asked to admit for further evaluation. Pt sees Dr. Irene Limbo at the cancer center, Dr. Isidore Moos radiation oncologist.   Assessment/Plan:    Principal Problem:   Hypotension - likely from progressive failure to thrive and poor oral intake - IVF have been provided and pt has responded well and BP has been stable since admission - continue to monitor vitals, PT evaluation requested   Active Problems:   Radiation-induced esophagitis - place on magic mouthwash - d/w rad oncologist for further recommendations     Antineoplastic chemotherapy induced pancytopenia ([place]) - blood counts still low - per oncology, can not give Neupogen as pt on radiation therapy - no need for blood transfusion at this time - will go ahead transfuse 1 unit of Plt  - CBC with diff in AM    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  Family Communication:  plan of care discussed with the patient Disposition  Plan: Home when stable.   IV access:  Peripheral IV  Procedures and diagnostic studies:    Nm Pet Image Restag (ps) Skull Base To Thigh 11/29/2014  Interval near complete metabolic response to therapy. Residual soft tissue in the right paratracheal and sub- carinal region exhibits nonspecific low level FDG uptake. 2. Increased uptake within the mid thoracic esophagus is favored to represent treatment related changes. 3. Gallstones 4. Lumbar spondylosis 5. Aortic atherosclerosis.  Medical Consultants:  Oncology  Other Consultants:  PT/OT  IAnti-Infectives:   None  Faye Ramsay, MD  [facility] Pager [phone]  If 7PM-7AM, please contact night-coverage [email or web address] Password [email or web address] 12/13/2014, 10:29 AM   LOS: 1 day   HPI/Subjective: No events overnight.   Objective: Filed Vitals:   12/13/14 0205 12/13/14 0215 12/13/14 0240 12/13/14 0520  BP: 139/65 133/67 138/63 147/74  Pulse: 96 98 95   Temp: 98.4 F (36.9 C) 98.4 F (36.9 C) 98.6 F (37 C) 98.4 F (36.9 C)  TempSrc: Oral Oral Oral Oral  Resp: '18 18 18 18  '$ Height:      Weight:      SpO2: 94% 95% 95% 97%    Intake/Output Summary (Last 24 hours) at 12/13/14 1029 Last data filed at 12/13/14 1287  Gross per 24 hour  Intake    930 ml  Output      0 ml  Net    930 ml    Exam:   General:  Pt is alert, follows commands appropriately, not in  acute distress  Cardiovascular: Regular rate and rhythm, no rubs, no gallops  Respiratory: Clear to auscultation bilaterally, no wheezing, diminished breath sounds at bases   Abdomen: Soft, non tender, non distended, bowel sounds present, no guarding  Data Reviewed: Basic Metabolic Panel:  Recent Labs Lab 12/06/14 1242 12/12/14 1303 12/13/14 0800  NA 133* 134* 134*  K 4.6 4.5 4.0  CL  --   --  101  CO2 '25 26 27  '$ GLUCOSE 207* 172* 114*  BUN 34.4* 20.6 18  CREATININE 1.1 1.0 0.78  CALCIUM 9.0 9.5 8.6*  MG 2.0  --  1.7   Liver Function Tests:  Recent Labs Lab  12/06/14 1242 12/12/14 1303  AST 9 8  ALT 13 9  ALKPHOS 61 62  BILITOT 0.68 0.72  PROT 6.4 6.4  ALBUMIN 3.3* 3.2*   CBC:  Recent Labs Lab 12/06/14 1242 12/12/14 1303 12/13/14 0800  WBC 5.0 0.8* 0.7*  NEUTROABS 4.5 0.4*  --   HGB 10.0* 8.0* 9.9*  HCT 29.3* 23.2* 27.9*  MCV 89.3 86.9 86.9  PLT 174 34* 25*   CBG:  Recent Labs Lab 12/12/14 2148 12/13/14 0734  GLUCAP 135* 109*   Scheduled Meds: . acetaminophen  650 mg Oral Once  . citalopram  10 mg Oral Daily  . insulin aspart  0-15 Units Subcutaneous TID WC  . levothyroxine  112 mcg Oral BID  . simvastatin  20 mg Oral QHS  . sucralfate  1 g Oral TID WC & HS  . vitamin B-12  1,000 mcg Oral Daily   Continuous Infusions: . sodium chloride 75 mL/hr at 12/13/14 0529

## 2014-12-13 NOTE — Progress Notes (Signed)
Initial Nutrition Assessment  DOCUMENTATION CODES:   Not applicable  INTERVENTION:   Providing pudding, jello, and ice cream with meals to work around esophagitis   NUTRITION DIAGNOSIS:   Inadequate oral intake related to cancer and cancer related treatments as evidenced by per patient/family report.  GOAL:   Patient will meet greater than or equal to 90% of their needs  MONITOR:   PO intake, Supplement acceptance, I & O's, Skin, Labs  REASON FOR ASSESSMENT:   Consult, Malnutrition Screening Tool Assessment of nutrition requirement/status  ASSESSMENT:   Rachel Chapman is a 74 y.o. 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 Patient is undergoing chemo and radiation therapy. Last chemo treatment was ~2 weeks ago per family, last radiation therapy was today. Patient reported feeling ill today during radiotherapy. She was running low BPs in the 60s-70s range which improved after 1L IVF bolus. Lab work revealed pancytopenia, HGB of 8.0, WBC of 0.8, and platelets in the 30s. Patient is also suffering from poor PO intake due to radiation induced esophagitis  Spoke with pt with son at beside. Son states mom was eating well until radiation induced esophagitis set in. She states everything feels like it is burning.  I spoke with her about trying soft/cold foods, she is willing to try anything soft.  Nutrition-Focused physical exam completed. Findings are Mild fat depletion, No muscle depletion, and No edema.   HGB 9.9, HCT 27.9  Medications reviewed  Follow for tolerance.  Diet Order:  Diet Carb Modified Fluid consistency:: Thin; Room service appropriate?: Yes  Skin:  Reviewed, no issues  Last BM:  PTA  Height:   Ht Readings from Last 1 Encounters:  12/12/14 '5\' 2"'$  (1.575 m)    Weight:   Wt Readings from Last 1 Encounters:  12/12/14 142 lb 9.6 oz (64.683 kg)    Ideal Body Weight:  50 kg  BMI:  Body mass index is 26.08 kg/(m^2).  Estimated Nutritional  Needs:   Kcal:  1600-1900 calories  Protein:  65-75 grams  Fluid:  >/= 1.6L  EDUCATION NEEDS:   No education needs identified at this time  Satira Anis. Rylan Bernard, MS, RD LDN After Hours/Weekend Pager [phone]

## 2014-12-13 NOTE — Telephone Encounter (Signed)
Per staff message from desk RN and POF I have scheduled appts. Advised desk RN  of appts. JMW

## 2014-12-13 NOTE — Therapy (Signed)
[facility] Therapy Treatment Record Phone [phone]   [id number] Therapy was administered to Rachel Chapman on: 12/13/2014  4:40 PM and was treatment # 27 out of a planned course of 30 treatments.

## 2014-12-13 NOTE — Assessment & Plan Note (Addendum)
Patient is complaining of some chronic esophageal discomfort for the past 1-2 weeks.  Patient was given a total of 4 mg morphine IV; with no relief in her discomfort.  She states that she becomes nauseous, and gags whenever she takes any oral hydration.  Patient received IV fluid rehydration while at the [facility] today; but failed a oral trial of fluids.  Patient will be admitted to the hospital for further evaluation and management.

## 2014-12-13 NOTE — Assessment & Plan Note (Signed)
Patient received cycle 3 of her carboplatin/etoposide chemotherapy regimen on 11/30/2014.  She also continues to receive radiation treatments on a daily basis.  Patient reports that her radiation treatments will be completed at the end of this week.  While receiving her radiation treatment.-Patient was noted to be experiencing significant orthostatic hypotension with systolic blood pressure in the mid 50s.  Per patient and notes-patient has had chronic issues with orthostatic hypotension for greater than one year.  Patient states that she used to take Lopressor; but has not taken any blood pressure medications for greater than 3 weeks.  Patient also reports some significant pain in her esophageal area when she swallows.  She states that the pain in her esophageal area causes her to become nauseous and she begins to gag.  She has had minimal oral intake and feels dehydrated today.  Patient has chemotherapy induced pancytopenia with PVCs 0.8, ANC 0.4, hemoglobin 8.0, and platelet count 34.  Patient received IV fluid rehydration while at the [facility]; and blood pressure improved to 94/41 while standing.  However, patient attempted to drink fluids-and began coughing and gagging once again.  Due to spelled fluid trial-patient will be corrected.  Admitted to the hospitalist program via Dr. Doyle Askew.  Brief history and report were given to the hospitalist pressure.  The patient being transferred to the hospital via wheelchair.  Per the cancer Center nurse.

## 2014-12-13 NOTE — Assessment & Plan Note (Signed)
Patient reports experiencing some chronic nausea and esophageal discomfort when she tries to swallow any oral intake whatsoever.  Quite possibly patient is experiencing radiation-induced esophagitis.  Patient already has Magic mouthwash with lidocaine at home to use.  Due to patient's inability to take in oral fluids with no difficulty.-Patient will be admitted to the hospital for further evaluation and management.

## 2014-12-13 NOTE — Progress Notes (Signed)
SYMPTOM MANAGEMENT CLINIC   HPI: Rachel Chapman 74 y.o. female diagnosed with lung cancer.  Currently undergoing carboplatin/etoposide chemotherapy; as well as radiation treatments.  Patient was noted to have significant orthostatic hypotension with a systolic blood pressure in the mid 50s while in radiation oncology.  Patient has history of chronic orthostatic hypotension for quite some time.  She is also complaining of chronic esophagitis-like discomfort; and states that she becomes nauseous and When she tries to take in any oral intake.Patient denies any recent fevers or chills.   HPI  ROS  Past Medical History  Diagnosis Date  . Hypertension   . Dyslipidemia   . Diabetes ([place])   . Hypothyroidism   . Pneumonia 2007  . Smoker     1.5 packs per day for 60 years.  Quit one month ago  . Anxiety   . Cancer ([place])   . Lung cancer ([place]) 8/216    hilar/mediastinum lung scca    Past Surgical History  Procedure Laterality Date  . Cesarean section      has Cancer of middle lobe of lung ([place]); Diabetes type 2, controlled ([place]); Hypothyroidism; Hyperlipidemia; Dehydration; Radiation-induced esophagitis; Hypotension; Antineoplastic chemotherapy induced pancytopenia ([place]); Protein-calorie malnutrition, severe ([place]); Hyponatremia; Orthostatic hypotension; Neoplasm related pain; and Nausea without vomiting on her problem list.    has No Known Allergies.    Medication List       This list is accurate as of: 12/12/14  6:50 PM.  Always use your most recent med list.               albuterol 108 (90 BASE) MCG/ACT inhaler  Commonly known as:  PROVENTIL HFA;VENTOLIN HFA  Inhale 2 puffs into the lungs every 6 (six) hours as needed for shortness of breath.     cholecalciferol 1000 UNITS tablet  Commonly known as:  VITAMIN D  Take 1 tablet (1,000 Units total) by mouth daily.     citalopram 10 MG tablet  Commonly known as:  CELEXA  Take 1 tablet (10 mg total) by mouth daily.     emollient cream  Commonly known as:  BIAFINE  Apply topically as needed.     glucose blood test strip  Commonly known as:  ONETOUCH VERIO  Test once daily.     levothyroxine 112 MCG tablet  Commonly known as:  SYNTHROID, LEVOTHROID  Take 1 tablet (112 mcg total) by mouth 2 (two) times daily.     lidocaine-prilocaine cream  Commonly known as:  EMLA  Apply topically once.     lisinopril 10 MG tablet  Commonly known as:  PRINIVIL,ZESTRIL  Take 1 tablet (10 mg total) by mouth daily.     LORazepam 0.5 MG tablet  Commonly known as:  ATIVAN  Take 1 tablet (0.5 mg total) by mouth every 8 (eight) hours as needed for anxiety (or nausea). Could take 2 tab 30-45 mins prior to PET/CT and MRI Brain     magic mouthwash w/lidocaine Soln  Take 5 mLs by mouth 4 (four) times daily as needed for mouth pain.     ondansetron 4 MG disintegrating tablet  Commonly known as:  ZOFRAN-ODT  Take 4 mg by mouth every 8 (eight) hours as needed for nausea or vomiting.     ONETOUCH DELICA LANCETS FINE Misc  Test once daily.     senna-docusate 8.6-50 MG tablet  Commonly known as:  SENNA S  Take 2 tablets by mouth at bedtime as needed for mild constipation.  simvastatin 20 MG tablet  Commonly known as:  ZOCOR  Take 1 tablet (20 mg total) by mouth at bedtime.     sitaGLIPtin 100 MG tablet  Commonly known as:  JANUVIA  Take 1 tablet (100 mg total) by mouth daily.     sucralfate 1 G tablet  Commonly known as:  CARAFATE  Dissolve 1 tablet in 10 mL H20 and swallow 5 min before meals     vitamin B-12 1000 MCG tablet  Commonly known as:  CYANOCOBALAMIN  Take 1 tablet (1,000 mcg total) by mouth daily.         PHYSICAL EXAMINATION  Oncology Vitals 12/13/2014 12/13/2014 12/13/2014 12/13/2014 12/13/2014 12/13/2014 12/13/2014  Height - - - - - - -  Weight - - - - - - -  Weight (lbs) - - - - - - -  BMI (kg/m2) - - - - - - -  Temp 98.9 99 98.7 98.7 98.3 98.4 98.6  Pulse 96 88 101 97 93 - 95  Resp 18  18 18 18  - 18 18  SpO2 94 96 95 93 93 97 95  BSA (m2) - - - - - - -   BP Readings from Last 3 Encounters:  12/13/14 167/71  12/12/14 149/68  12/12/14 94/41    Physical Exam  Constitutional: She is oriented to person, place, and time and well-developed, well-nourished, and in no distress.  HENT:  Head: Normocephalic and atraumatic.  Mouth/Throat: Oropharynx is clear and moist.  Eyes: Conjunctivae and EOM are normal. Pupils are equal, round, and reactive to light. Right eye exhibits no discharge. Left eye exhibits no discharge. No scleral icterus.  Neck: Normal range of motion. Neck supple. No JVD present. No tracheal deviation present. No thyromegaly present.  Cardiovascular: Normal rate, regular rhythm, normal heart sounds and intact distal pulses.   Pulmonary/Chest: Effort normal and breath sounds normal. No stridor. No respiratory distress. She has no wheezes. She has no rales. She exhibits no tenderness.  Abdominal: Soft. Bowel sounds are normal. She exhibits no distension and no mass. There is no tenderness. There is no rebound and no guarding.  Musculoskeletal: Normal range of motion. She exhibits no edema or tenderness.  Lymphadenopathy:    She has no cervical adenopathy.  Neurological: She is alert and oriented to person, place, and time. Gait normal.  Skin: Skin is warm and dry. No rash noted. No erythema. No pallor.  Psychiatric: Affect normal.  Nursing note and vitals reviewed.   LABORATORY DATA:. Admission on 12/12/2014  Component Date Value Ref Range Status  . Order Confirmation 12/12/2014 ORDER PROCESSED BY BLOOD BANK   Final  . Sodium 12/13/2014 134* 135 - 145 mmol/L Final  . Potassium 12/13/2014 4.0  3.5 - 5.1 mmol/L Final  . Chloride 12/13/2014 101  101 - 111 mmol/L Final  . CO2 12/13/2014 27  22 - 32 mmol/L Final  . Glucose, Bld 12/13/2014 114* 65 - 99 mg/dL Final  . BUN 12/13/2014 18  6 - 20 mg/dL Final  . Creatinine, Ser 12/13/2014 0.78  0.44 - 1.00 mg/dL  Final  . Calcium 12/13/2014 8.6* 8.9 - 10.3 mg/dL Final  . GFR calc non Af Amer 12/13/2014 >60  >60 mL/min Final  . GFR calc Af Amer 12/13/2014 >60  >60 mL/min Final   Comment: (NOTE) The eGFR has been calculated using the CKD EPI equation. This calculation has not been validated in all clinical situations. eGFR's persistently <60 mL/min signify possible Chronic Kidney Disease.   Marland Kitchen  Anion gap 12/13/2014 6  5 - 15 Final  . WBC 12/13/2014 0.7* 4.0 - 10.5 K/uL Final   Comment: REPEATED TO VERIFY CRITICAL RESULT CALLED TO, READ BACK BY AND VERIFIED WITH: A. MILTON RN AT 0845 ON 11.1.16 BY SHUEA   . RBC 12/13/2014 3.21* 3.87 - 5.11 MIL/uL Final  . Hemoglobin 12/13/2014 9.9* 12.0 - 15.0 g/dL Final  . HCT 12/13/2014 27.9* 36.0 - 46.0 % Final  . MCV 12/13/2014 86.9  78.0 - 100.0 fL Final  . MCH 12/13/2014 30.8  26.0 - 34.0 pg Final  . MCHC 12/13/2014 35.5  30.0 - 36.0 g/dL Final  . RDW 12/13/2014 14.9  11.5 - 15.5 % Final  . Platelets 12/13/2014 25* 150 - 400 K/uL Final   Comment: SPECIMEN CHECKED FOR CLOTS REPEATED TO VERIFY PLATELET COUNT CONFIRMED BY SMEAR CRITICAL RESULT CALLED TO, READ BACK BY AND VERIFIED WITH: A. MILTON RN AT 0845 ON 11.1.16 BY SHUEA   . ABO/RH(D) 12/12/2014 B POS   Final  . Antibody Screen 12/12/2014 NEG   Final  . Sample Expiration 12/12/2014 12/15/2014   Final  . Unit Number 12/12/2014 [id number]   Final  . Blood Component Type 12/12/2014 RED CELLS,LR   Final  . Unit division 12/12/2014 00   Final  . Status of Unit 12/12/2014 ISSUED   Final  . Transfusion Status 12/12/2014 OK TO TRANSFUSE   Final  . Crossmatch Result 12/12/2014 Compatible   Final  . Unit Number 12/12/2014 [id number]   Final  . Blood Component Type 12/12/2014 RED CELLS,LR   Final  . Unit division 12/12/2014 00   Final  . Status of Unit 12/12/2014 ISSUED,FINAL   Final  . Transfusion Status 12/12/2014 OK TO TRANSFUSE   Final  . Crossmatch Result 12/12/2014 Compatible   Final  .  Glucose-Capillary 12/12/2014 135* 65 - 99 mg/dL Final  . Glucose-Capillary 12/13/2014 109* 65 - 99 mg/dL Final  . Magnesium 12/13/2014 1.7  1.7 - 2.4 mg/dL Final  . Unit Number 12/13/2014 [id number]   Final  . Blood Component Type 12/13/2014 PLTPHER LR1   Final  . Unit division 12/13/2014 00   Final  . Status of Unit 12/13/2014 ISSUED   Final  . Transfusion Status 12/13/2014 OK TO TRANSFUSE   Final  . Glucose-Capillary 12/13/2014 140* 65 - 99 mg/dL Final  . Glucose-Capillary 12/13/2014 100* 65 - 99 mg/dL Final  . Glucose-Capillary 12/13/2014 148* 65 - 99 mg/dL Final  Appointment on 12/12/2014  Component Date Value Ref Range Status  . WBC 12/12/2014 0.8* 3.9 - 10.3 10e3/uL Final  . NEUT# 12/12/2014 0.4* 1.5 - 6.5 10e3/uL Final  . HGB 12/12/2014 8.0* 11.6 - 15.9 g/dL Final  . HCT 12/12/2014 23.2* 34.8 - 46.6 % Final  . Platelets 12/12/2014 34* 145 - 400 10e3/uL Final  . MCV 12/12/2014 86.9  79.5 - 101.0 fL Final  . MCH 12/12/2014 30.0  25.1 - 34.0 pg Final  . MCHC 12/12/2014 34.5  31.5 - 36.0 g/dL Final  . RBC 12/12/2014 2.67* 3.70 - 5.45 10e6/uL Final  . RDW 12/12/2014 15.8* 11.2 - 14.5 % Final  . lymph# 12/12/2014 0.2* 0.9 - 3.3 10e3/uL Final  . MONO# 12/12/2014 0.2  0.1 - 0.9 10e3/uL Final  . Eosinophils Absolute 12/12/2014 0.0  0.0 - 0.5 10e3/uL Final  . Basophils Absolute 12/12/2014 0.0  0.0 - 0.1 10e3/uL Final  . NEUT% 12/12/2014 46.7  38.4 - 76.8 % Final  . LYMPH% 12/12/2014 25.3  14.0 - 49.7 %  Final  . MONO% 12/12/2014 22.7* 0.0 - 14.0 % Final  . EOS% 12/12/2014 4.0  0.0 - 7.0 % Final  . BASO% 12/12/2014 1.3  0.0 - 2.0 % Final  . nRBC 12/12/2014 0  0 - 0 % Final  . Sodium 12/12/2014 134* 136 - 145 mEq/L Final  . Potassium 12/12/2014 4.5  3.5 - 5.1 mEq/L Final  . Chloride 12/12/2014 101  98 - 109 mEq/L Final  . CO2 12/12/2014 26  22 - 29 mEq/L Final  . Glucose 12/12/2014 172* 70 - 140 mg/dl Final   Glucose reference range is for nonfasting patients. Fasting glucose  reference range is 70- 100.  Marland Kitchen BUN 12/12/2014 20.6  7.0 - 26.0 mg/dL Final  . Creatinine 12/12/2014 1.0  0.6 - 1.1 mg/dL Final  . Total Bilirubin 12/12/2014 0.72  0.20 - 1.20 mg/dL Final  . Alkaline Phosphatase 12/12/2014 62  40 - 150 U/L Final  . AST 12/12/2014 8  5 - 34 U/L Final  . ALT 12/12/2014 9  0 - 55 U/L Final  . Total Protein 12/12/2014 6.4  6.4 - 8.3 g/dL Final  . Albumin 12/12/2014 3.2* 3.5 - 5.0 g/dL Final  . Calcium 12/12/2014 9.5  8.4 - 10.4 mg/dL Final  . Anion Gap 12/12/2014 7  3 - 11 mEq/L Final  . EGFR 12/12/2014 52* >90 ml/min/1.73 m2 Final   eGFR is calculated using the CKD-EPI Creatinine Equation (2009)     RADIOGRAPHIC STUDIES: No results found.  ASSESSMENT/PLAN:    Cancer of middle lobe of lung ([place]) Patient received cycle 3 of her carboplatin/etoposide chemotherapy regimen on 11/30/2014.  She also continues to receive radiation treatments on a daily basis.  Patient reports that her radiation treatments will be completed at the end of this week.  While receiving her radiation treatment.-Patient was noted to be experiencing significant orthostatic hypotension with systolic blood pressure in the mid 50s.  Per patient and notes-patient has had chronic issues with orthostatic hypotension for greater than one year.  Patient states that she used to take Lopressor; but has not taken any blood pressure medications for greater than 3 weeks.  Patient also reports some significant pain in her esophageal area when she swallows.  She states that the pain in her esophageal area causes her to become nauseous and she begins to gag.  She has had minimal oral intake and feels dehydrated today.  Patient has chemotherapy induced pancytopenia with PVCs 0.8, ANC 0.4, hemoglobin 8.0, and platelet count 34.  Patient received IV fluid rehydration while at the [facility]; and blood pressure improved to 94/41 while standing.  However, patient attempted to drink fluids-and began  coughing and gagging once again.  Due to spelled fluid trial-patient will be corrected.  Admitted to the hospitalist program via Dr. Doyle Askew.  Brief history and report were given to the hospitalist pressure.  The patient being transferred to the hospital via wheelchair.  Per the cancer Center nurse.    Dehydration Patient reports some chronic nausea and esophageal pain; causing her to have minimal oral intake recently.  She feels dehydrated today.  Patient received approximately 1100 ML's normal saline IV fluid rehydration while cancer Center.  However, patient felt her fluid trial prior to discharge.  She began gagging and was nauseous following one sip of fluids.  Patient will be direct admitted to the hospital via the hospitalist program for further evaluation and management.  Nausea without vomiting Patient reports experiencing some chronic nausea and esophageal discomfort  when she tries to swallow any oral intake whatsoever.  Quite possibly patient is experiencing radiation-induced esophagitis.  Patient already has Magic mouthwash with lidocaine at home to use.  Due to patient's inability to take in oral fluids with no difficulty.-Patient will be admitted to the hospital for further evaluation and management.  Neoplasm related pain Patient is complaining of some chronic esophageal discomfort for the past 1-2 weeks.  Patient was given a total of 4 mg morphine IV; with no relief in her discomfort.  She states that she becomes nauseous, and gags whenever she takes any oral hydration.  Patient received IV fluid rehydration while at the [facility] today; but failed a oral trial of fluids.  Patient will be admitted to the hospital for further evaluation and management.  Orthostatic hypotension Patient has history of chronic orthostatic hypotension for quite some time; but was found to be significantly hypotensive with standing while in her radiation treatment today.  Systolic blood pressure was  in the 50s.  Patient states that she used to take blood pressure Lopressor blood pressure medication; but discontinued all blood pressure medication approximately 3 weeks ago.  Patient denies any other new symptoms whatsoever.  Patient was given approximately 1100 mL mls saline IV fluid rehydration while at the cancer Center today.  Patient was still unable to manage oral fluids; despite her blood pressure improving to 9441.  Patient will be admitted to the hospital for further evaluation and management.  Antineoplastic chemotherapy induced pancytopenia [facility]) Patient received cycle 3 of her carboplatin/etoposide on 11/30/2014.  Labs obtained today revealed.  ANC is 0.4, hemoglobin 8.0, and platelet count of 34.  Briefly review of all neutropenia guidelines with both patient and family today.  Also, patients hemoglobin is low; and will require a transfusion.  Patient denies any worsening issues with either bleeding or bruising secondary to thrombocytopenia.  Patient will be admitted to the hospital for further evaluation and management today.  See previous notes regarding transfer to the hospital.  Patient stated understanding of all instructions; and was in agreement with this plan of care. The patient knows to call the clinic with any problems, questions or concerns.   This was a shared visit with Dr.Kale.   Total time spent with patient was 40 minutes;  with greater than 75 percent of that time spent in face to face counseling regarding patient's symptoms,  and coordination of care and follow up.  Disclaimer:This dictation was prepared with Dragon/digital dictation along with [employer]. Any transcriptional errors that result from this process are unintentional.  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  Comes with significantly compromise oral intake with dehydration and orthostasis.  Symptomatic anemia  No overt blood loss.  Pancytopenia from chemoradiation.  Significant discomfort from radiation esophagitis. Patient was given IV fluids in the clinic and hospitalized for water replacement, PRBC transfusion for symptomatic anemia, aggressive symptom control to allow for oral intake and  Completion of her RT. I will continue to follow her as an inpatient.  We might need to delay her fourth cycle of carboplatin and etoposide depending on how her counts bounce back and how she is doing clinically. Patient was interviewed and examined by me independently and discussed in detail with Rachel Chapman.  Above documentation reflects  our joint assessment and plan.  Sullivan Lone MD MS

## 2014-12-13 NOTE — Evaluation (Signed)
Physical Therapy One Time Evaluation Patient Details Name: Fleda Pagel MRN: [id number] DOB: 1941-02-11 Today's Date: 12/13/2014   History of Present Illness  74 y.o. 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 Patient presented from cancer center to [facility] for evaluation of progressive weakness and failure to thrive; diagnosis of hypotension and radiation-induced esophagitis  Clinical Impression  Patient evaluated by Physical Therapy with no further acute PT needs identified. All education has been completed and the patient has no further questions.  Pt mobilizing well and denies any further PT needs at this time.  Pt states her son and daughter in law can assist with any needs upon d/c. See below for any follow-up Physical Therapy or equipment needs. PT is signing off. Thank you for this referral.       Follow Up Recommendations No PT follow up    Equipment Recommendations  None recommended by PT    Recommendations for Other Services       Precautions / Restrictions Precautions Precautions: None      Mobility  Bed Mobility Overal bed mobility: Modified Independent                Transfers Overall transfer level: Modified independent                  Ambulation/Gait Ambulation/Gait assistance: Supervision;Modified independent (Device/Increase time) Ambulation Distance (Feet): 240 Feet Assistive device: None Gait Pattern/deviations: Step-through pattern;Decreased stride length     General Gait Details: slow but steady pace, pt pushed IV pole, denies hx of falls  Stairs            Wheelchair Mobility    Modified Rankin (Stroke Patients Only)       Balance Overall balance assessment: No apparent balance deficits (not formally assessed)                                           Pertinent Vitals/Pain Pain Assessment: No/denies pain    Home Living Family/patient expects to  be discharged to:: Private residence Living Arrangements: Children (son and daughter in [employer]) Available Help at Discharge: Family         Home Layout: One level Home Equipment: None      Prior Function Level of Independence: Independent               Hand Dominance        Extremity/Trunk Assessment               Lower Extremity Assessment: Overall WFL for tasks assessed      Cervical / Trunk Assessment: Normal  Communication   Communication: No difficulties  Cognition Arousal/Alertness: Awake/alert Behavior During Therapy: WFL for tasks assessed/performed Overall Cognitive Status: Within Functional Limits for tasks assessed                      General Comments      Exercises        Assessment/Plan    PT Assessment Patent does not need any further PT services  PT Diagnosis Difficulty walking   PT Problem List    PT Treatment Interventions     PT Goals (Current goals can be found in the Care Plan section) Acute Rehab PT Goals PT Goal Formulation: All assessment and education complete, DC therapy    Frequency  Barriers to discharge        Co-evaluation               End of Session   Activity Tolerance: Patient tolerated treatment well Patient left: in bed;with call bell/phone within reach Nurse Communication: Mobility status         Time: 1400-1411 PT Time Calculation (min) (ACUTE ONLY): 11 min   Charges:   PT Evaluation $Initial PT Evaluation Tier I: 1 Procedure     PT G Codes:        Tedford Berg,KATHrine E 12/13/2014, 2:18 PM Carmelia Bake, PT, DPT 12/13/2014 Pager: [phone]

## 2014-12-13 NOTE — Care Management Note (Signed)
Case Management Note  Patient Details  Name: Hartley Wyke MRN: [id number] Date of Birth: [birth year]  Subjective/Objective:  PT-no f/u.                  Action/Plan:d/c home.no needs.   Expected Discharge Date:                 Expected Discharge Plan:  [place]  In-House Referral:     Discharge planning Services  CM Consult  Post Acute Care Choice:    Choice offered to:     DME Arranged:    DME Agency:     HH Arranged:    [place] Agency:     Status of Service:  In process, will continue to follow  Medicare Important Message Given:    Date Medicare IM Given:    Medicare IM give by:    Date Additional Medicare IM Given:    Additional Medicare Important Message give by:     If discussed at [place] of Stay Meetings, dates discussed:    Additional Comments:  Dessa Phi, RN 12/13/2014, 2:39 PM

## 2014-12-13 NOTE — Assessment & Plan Note (Signed)
Patient reports some chronic nausea and esophageal pain; causing her to have minimal oral intake recently.  She feels dehydrated today.  Patient received approximately 1100 ML's normal saline IV fluid rehydration while cancer Center.  However, patient felt her fluid trial prior to discharge.  She began gagging and was nauseous following one sip of fluids.  Patient will be direct admitted to the hospital via the hospitalist program for further evaluation and management.

## 2014-12-14 ENCOUNTER — Ambulatory Visit: Payer: Medicare Other

## 2014-12-14 ENCOUNTER — Ambulatory Visit
Admission: RE | Admit: 2014-12-14 | Discharge: 2014-12-14 | Disposition: A | Discharge status: Home / Self Care | Payer: Medicare Other | Source: Ambulatory Visit | Attending: Radiation Oncology | Admitting: Radiation Oncology

## 2014-12-14 ENCOUNTER — Ambulatory Visit: Payer: Medicare Other | Admitting: Hematology

## 2014-12-14 ENCOUNTER — Other Ambulatory Visit: Payer: Medicare Other

## 2014-12-14 DIAGNOSIS — R112 Nausea with vomiting, unspecified: Secondary | ICD-10-CM | POA: Insufficient documentation

## 2014-12-14 DIAGNOSIS — D649 Anemia, unspecified: Secondary | ICD-10-CM | POA: Insufficient documentation

## 2014-12-14 LAB — CBC WITH DIFFERENTIAL/PLATELET
BASOS PCT: 0 %
Basophils Absolute: 0 10*3/uL (ref 0.0–0.1)
EOS ABS: 0.1 10*3/uL (ref 0.0–0.7)
EOS PCT: 7 %
HCT: 26.2 % — ABNORMAL LOW (ref 36.0–46.0)
HEMOGLOBIN: 9.1 g/dL — AB (ref 12.0–15.0)
Lymphocytes Relative: 39 %
Lymphs Abs: 0.4 10*3/uL — ABNORMAL LOW (ref 0.7–4.0)
MCH: 29.6 pg (ref 26.0–34.0)
MCHC: 34.7 g/dL (ref 30.0–36.0)
MCV: 85.3 fL (ref 78.0–100.0)
MONO ABS: 0.4 10*3/uL (ref 0.1–1.0)
Monocytes Relative: 44 %
NEUTROS ABS: 0.1 10*3/uL — AB (ref 1.7–7.7)
Neutrophils Relative %: 10 %
PLATELETS: 62 10*3/uL — AB (ref 150–400)
RBC: 3.07 MIL/uL — ABNORMAL LOW (ref 3.87–5.11)
RDW: 14.7 % (ref 11.5–15.5)
WBC: 1 10*3/uL — CL (ref 4.0–10.5)

## 2014-12-14 LAB — TYPE AND SCREEN
ABO/RH(D): B POS
Antibody Screen: NEGATIVE
Unit division: 0
Unit division: 0

## 2014-12-14 LAB — BASIC METABOLIC PANEL
Anion gap: 4 — ABNORMAL LOW (ref 5–15)
BUN: 13 mg/dL (ref 6–20)
CALCIUM: 8.5 mg/dL — AB (ref 8.9–10.3)
CHLORIDE: 100 mmol/L — AB (ref 101–111)
CO2: 29 mmol/L (ref 22–32)
CREATININE: 0.74 mg/dL (ref 0.44–1.00)
GFR calc Af Amer: >60 mL/min (ref >60)
GFR calc non Af Amer: >60 mL/min (ref >60)
GLUCOSE: 102 mg/dL — AB (ref 65–99)
Potassium: 3.5 mmol/L (ref 3.5–5.1)
Sodium: 133 mmol/L — ABNORMAL LOW (ref 135–145)

## 2014-12-14 LAB — GLUCOSE, CAPILLARY
GLUCOSE-CAPILLARY: 101 mg/dL — AB (ref 65–99)
Glucose-Capillary: 120 mg/dL — ABNORMAL HIGH (ref 65–99)

## 2014-12-14 LAB — PREPARE PLATELET PHERESIS: Unit division: 0

## 2014-12-14 MED ORDER — GI COCKTAIL ~~LOC~~: 30.0000 mL | Freq: Three times a day (TID) | ORAL | Status: DC | PRN

## 2014-12-14 MED ORDER — HEPARIN SOD (PORK) LOCK FLUSH 100 UNIT/ML IV SOLN
500.0000 [IU] | INTRAVENOUS | Status: AC | PRN
  Administered 2014-12-14: 500 [IU]

## 2014-12-14 MED ORDER — OXYCODONE HCL 5 MG/5ML PO SOLN: 5.0000 mg | ORAL | Status: DC | PRN

## 2014-12-14 MED ORDER — SUCRALFATE 1 G PO TABS: 1.0000 g | ORAL_TABLET | Freq: Three times a day (TID) | ORAL | Status: DC

## 2014-12-14 MED ORDER — OXYCODONE HCL 5 MG/5ML PO SOLN
5.0000 mg | Freq: Three times a day (TID) | ORAL | Status: DC
  Administered 2014-12-14: 5 mg via ORAL

## 2014-12-14 NOTE — Progress Notes (Signed)
Marland Kitchen   HEMATOLOGY/ONCOLOGY INPATIENT PROGRESS NOTE  Date of Service: 12/14/2014  Inpatient Attending: .Theodis Blaze, MD   SUBJECTIVE  Patient was seen around noon today notes no pain between meals but still with significant pain with swallowing. Instructed nurse to schedule her oxycodone pre-meals as well as when necessary. No other acute new symptoms. Hospitalist team planning on discharging the patient today.  OBJECTIVE:  NAD  PHYSICAL EXAMINATION: . Filed Vitals:   12/13/14 1625 12/13/14 2142 12/14/14 0540 12/14/14 1330  BP: 124/62 167/71 164/67 125/64  Pulse: 88 96 90 99  Temp: 99 F (37.2 C) 98.9 F (37.2 C) 99.7 F (37.6 C) 97.4 F (36.3 C)  TempSrc: Oral Oral Oral Oral  Resp: '18 18 16 18  '$ Height:      Weight:      SpO2: 96% 94% 93% 95%   Filed Weights   12/12/14 2016  Weight: 142 lb 9.6 oz (64.683 kg)   .Body mass index is 26.08 kg/(m^2).  GENERAL:alert, in no acute distress and comfortable SKIN: skin color, texture, turgor are normal, no rashes or significant lesions EYES: normal, conjunctiva are pink and non-injected, sclera clear OROPHARYNX:no exudate, no erythema and lips, buccal mucosa, and tongue normal  NECK: supple, no JVD, thyroid normal size, non-tender, without nodularity LYMPH:  no palpable lymphadenopathy in the cervical, axillary or inguinal LUNGS: clear to auscultation with normal respiratory effort HEART: regular rate & rhythm,  no murmurs and no lower extremity edema ABDOMEN: abdomen soft, non-tender, normoactive bowel sounds  Musculoskeletal: no cyanosis of digits and no clubbing  PSYCH: alert & oriented x 3 with fluent speech NEURO: no focal motor/sensory deficits  MEDICAL HISTORY:  Past Medical History  Diagnosis Date  . Hypertension   . Dyslipidemia   . Diabetes ([place])   . Hypothyroidism   . Pneumonia 2007  . Smoker     1.5 packs per day for 60 years.  Quit one month ago  . Anxiety   . Cancer ([place])   . Lung cancer ([place]) 8/216      hilar/mediastinum lung scca    SURGICAL HISTORY: Past Surgical History  Procedure Laterality Date  . Cesarean section      SOCIAL HISTORY: Social History   Social History  . Marital Status: Widowed    Spouse Name: N/A  . Number of Children: N/A  . Years of Education: N/A   Occupational History  . Not on file.   Social History Main Topics  . Smoking status: Former Smoker -- 1.50 packs/day for 60 years    Quit date: 09/12/2014  . Smokeless tobacco: Not on file  . Alcohol Use: No  . Drug Use: No  . Sexual Activity: No   Other Topics Concern  . Not on file   Social History Narrative   She has moved from [place]   She is retired from working with special education    Was married widowed twice.           FAMILY HISTORY: Family History  Problem Relation Age of Onset  . Clotting disorder Neg Hx   . Cancer Mother     lung non smoker  . Cancer Sister     colon  . Diabetes Sister   . Diabetes Mother     ALLERGIES:  has No Known Allergies.  MEDICATIONS:  Scheduled Meds: . acetaminophen  650 mg Oral Once  . antiseptic oral rinse  7 mL Mouth Rinse q12n4p  . chlorhexidine  15 mL Mouth Rinse  BID  . citalopram  10 mg Oral Daily  . insulin aspart  0-15 Units Subcutaneous TID WC  . levothyroxine  112 mcg Oral BID  . magic mouthwash w/lidocaine  5 mL Oral TID PC & HS  . oxyCODONE  5 mg Oral TID AC  . simvastatin  20 mg Oral QHS  . sodium chloride  10-40 mL Intracatheter Q12H  . sucralfate  1 g Oral TID WC & HS  . vitamin B-12  1,000 mcg Oral Daily   Continuous Infusions: . sodium chloride 75 mL/hr at 12/13/14 2228   PRN Meds:.albuterol, gi cocktail, heparin lock flush, heparin lock flush, LORazepam, morphine injection, ondansetron (ZOFRAN) IVPB, ondansetron, oxyCODONE, senna-docusate, sodium chloride, sodium chloride  REVIEW OF SYSTEMS:    10 Point review of Systems was done is negative except as noted above.   LABORATORY DATA:  I have reviewed the data as  listed  . CBC Latest Ref Rng 12/14/2014 12/13/2014 12/12/2014  WBC 4.0 - 10.5 K/uL 1.0(LL) 0.7(LL) 0.8(LL)  Hemoglobin 12.0 - 15.0 g/dL 9.1(L) 9.9(L) 8.0(L)  Hematocrit 36.0 - 46.0 % 26.2(L) 27.9(L) 23.2(L)  Platelets 150 - 400 K/uL 62(L) 25(LL) 34(L)    . CMP Latest Ref Rng 12/14/2014 12/13/2014 12/12/2014  Glucose 65 - 99 mg/dL 102(H) 114(H) 172(H)  BUN 6 - 20 mg/dL 13 18 20.6  Creatinine 0.44 - 1.00 mg/dL 0.74 0.78 1.0  Sodium 135 - 145 mmol/L 133(L) 134(L) 134(L)  Potassium 3.5 - 5.1 mmol/L 3.5 4.0 4.5  Chloride 101 - 111 mmol/L 100(L) 101 -  CO2 22 - 32 mmol/L '29 27 26  '$ Calcium 8.9 - 10.3 mg/dL 8.5(L) 8.6(L) 9.5  Total Protein 6.4 - 8.3 g/dL - - 6.4  Total Bilirubin 0.20 - 1.20 mg/dL - - 0.72  Alkaline Phos 40 - 150 U/L - - 62  AST 5 - 34 U/L - - 8  ALT 0 - 55 U/L - - 9     RADIOGRAPHIC STUDIES: I have personally reviewed the radiological images as listed and agreed with the findings in the report. Nm Pet Image Restag (ps) Skull Base To Thigh  11/29/2014  CLINICAL DATA:  Subsequent treatment strategy for Lung cancer. EXAM: NUCLEAR MEDICINE PET SKULL BASE TO THIGH TECHNIQUE: 7.26 mCi F-18 FDG was injected intravenously. Full-ring PET imaging was performed from the skull base to thigh after the radiotracer. CT data was obtained and used for attenuation correction and anatomic localization. FASTING BLOOD GLUCOSE:  Value: 214 mg/dl COMPARISON:  10/19/2014 FINDINGS: NECK No hypermetabolic lymph nodes in the neck. CHEST Diffuse uptake throughout both lobes of the thyroid gland noted. Aortic atherosclerosis noted. Small right pleural effusion is identified. New from previous exam. Large perihilar right lung mass has resolved in the interval. Residual soft tissue within the right paratracheal region measures 1.6 cm and has an SUV max equal to 3.61. On the previous exam the SUV max within this area was equal to 15.01. Soft tissue within the sub- carinal region measures 1.2 cm on the current  exam. This has an SUV max equal to 3.62. On the previous exam increased uptake within the sub- carinal region had an SUV max equal to 14.1. Increased uptake within the esophagus is identified at the level of the sub- carinal region which may reflect treatment related inflammation. No new areas of hypermetabolic tumor identified. Scarring within the anterior and lateral aspect of the left upper lobe an anterior medial aspect of the right upper lobe is identified. ABDOMEN/PELVIS No abnormal hypermetabolic  activity within the liver, pancreas, adrenal glands, or spleen. Gallstones noted. No hypermetabolic lymph nodes in the abdomen or pelvis. SKELETON There is diminished marrow activity identified involving the upper thoracic spine which may represent changes secondary to external beam radiation. Spondylosis within the lumbar spine is noted. 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 IMPRESSION: 1. Interval near complete metabolic response to therapy. Residual soft tissue in the right paratracheal and sub- carinal region exhibits nonspecific low level FDG uptake. 2. Increased uptake within the mid thoracic esophagus is favored to represent treatment related changes. 3. Gallstones 4. Lumbar spondylosis 5. Aortic atherosclerosis. Electronically Signed   By: Kerby Moors M.D.   On: 11/29/2014 10:51    ASSESSMENT & PLAN:    74 year old female with  #1 Limited stage small cell lung cancer status post 3 cycles of carboplatin and etoposide chemotherapy with ongoing concurrent radiation therapy. PET/CT scan after 2 cycles showed excellent response.  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 #6 dehydration due to poor intake-resolved Plan -Magic mouthwash swish and swallow 3 times a day before meals and at bedtime -Continue sucralfate 4 times a day -Oxycodone  5 mg 3 times a day before meals and additional 5-10 mg every 4 hours as needed for pain. -Transfuse when necessary for hemoglobin less than 8 or symptomatic. -Monitor for resolution of neutropenia.  Would hold off on G-CSF at this time given patient is still actively receiving radiation therapy.   -Patient will complete radiation therapy on 12/16/2014.  Anticipated that her radiation esophagitis will  Improve over 1-2 weeks after that. -Follow-up in clinic on 12/16/2014 for her appointment for IV fluids. -Continue follow-up with radiation oncology as per treatment plan. Follow-up for cycle of chemotherapy, labs and clinic visit on 12/21/2014. If pancytopenia still pretty significant might need to delay her next cycle of chemotherapy. -continue twice a week IV fluids as outpatient.  Patient might be discharge if she is able tolerate oral intake with adequate pain control.   Sullivan Lone MD [place] AAHIVMS [facility] Hematology/Oncology Physician [facility]  (Office):       [phone] (Work cell):  [phone] (Fax):           [phone]  12/14/2014 1:15 PM

## 2014-12-14 NOTE — Care Management Note (Signed)
Case Management Note  Patient Details  Name: Rachel Chapman MRN: [id number] Date of Birth: 27-Aug-1940  Subjective/Objective:   PT no f/u.                 Action/Plan:d/c home no needs or orders.   Expected Discharge Date:                 Expected Discharge Plan:  Home/Self Care  In-House Referral:     Discharge planning Services  CM Consult  Post Acute Care Choice:    Choice offered to:     DME Arranged:    DME Agency:     HH Arranged:    [place] Agency:     Status of Service:  Completed, signed off  Medicare Important Message Given:    Date Medicare IM Given:    Medicare IM give by:    Date Additional Medicare IM Given:    Additional Medicare Important Message give by:     If discussed at [place] of Stay Meetings, dates discussed:    Additional Comments:  Dessa Phi, RN 12/14/2014, 11:31 AM

## 2014-12-14 NOTE — Progress Notes (Signed)
Rachel Chapman   HEMATOLOGY/ONCOLOGY INPATIENT PROGRESS NOTE  Date of Service: 12/13/2014 Inpatient Attending: .Theodis Blaze, MD   SUBJECTIVE  Rachel Chapman was seen this evening.  Daughter and son at bedside.  She notes that her esophageal pain is much better controlled but still having some while swallowing.  She hasn't really used the oxycodone yet Lelon Frohlich was encouraged to use this before meals in addition to her GI cocktail/magic  mouthwash.  No further nausea or vomiting.  He feels much better after the PRBC transfusions.  No fevers or chills.  No evidence of overt bleeding.   OBJECTIVE:  NAD  PHYSICAL EXAMINATION: . Filed Vitals:   12/13/14 1252 12/13/14 1327 12/13/14 1625 12/13/14 2142  BP: 153/73 164/77 124/62 167/71  Pulse: 97 101 88 96  Temp: 98.7 F (37.1 C) 98.7 F (37.1 C) 99 F (37.2 C) 98.9 F (37.2 C)  TempSrc: Oral Oral Oral Oral  Resp: '18 18 18 18  '$ Height:      Weight:      SpO2: 93% 95% 96% 94%   Filed Weights   12/12/14 2016  Weight: 142 lb 9.6 oz (64.683 kg)   .Body mass index is 26.08 kg/(m^2).  GENERAL:alert, in no acute distress and comfortable SKIN: skin color, texture, turgor are normal, no rashes or significant lesions EYES: normal, conjunctiva are pink and non-injected, sclera clear OROPHARYNX:no exudate, no erythema and lips, buccal mucosa, and tongue normal  NECK: supple, no JVD, thyroid normal size, non-tender, without nodularity LYMPH:  no palpable lymphadenopathy in the cervical, axillary or inguinal LUNGS: clear to auscultation with normal respiratory effort HEART: regular rate & rhythm,  no murmurs and no lower extremity edema ABDOMEN: abdomen soft, non-tender, normoactive bowel sounds  Musculoskeletal: no cyanosis of digits and no clubbing  PSYCH: alert & oriented x 3 with fluent speech NEURO: no focal motor/sensory deficits  MEDICAL HISTORY:  Past Medical History  Diagnosis Date  . Hypertension   . Dyslipidemia   . Diabetes ([place])   .  Hypothyroidism   . Pneumonia 2007  . Smoker     1.5 packs per day for 60 years.  Quit one month ago  . Anxiety   . Cancer ([place])   . Lung cancer ([place]) 8/216    hilar/mediastinum lung scca    SURGICAL HISTORY: Past Surgical History  Procedure Laterality Date  . Cesarean section      SOCIAL HISTORY: Social History   Social History  . Marital Status: Widowed    Spouse Name: N/A  . Number of Children: N/A  . Years of Education: N/A   Occupational History  . Not on file.   Social History Main Topics  . Smoking status: Former Smoker -- 1.50 packs/day for 60 years    Quit date: 09/12/2014  . Smokeless tobacco: Not on file  . Alcohol Use: No  . Drug Use: No  . Sexual Activity: No   Other Topics Concern  . Not on file   Social History Narrative   She has moved from [place]   She is retired from working with special education    Was married widowed twice.           FAMILY HISTORY: Family History  Problem Relation Age of Onset  . Clotting disorder Neg Hx   . Cancer Mother     lung non smoker  . Cancer Sister     colon  . Diabetes Sister   . Diabetes Mother     ALLERGIES:  has No Known Allergies.  MEDICATIONS:  Scheduled Meds: . acetaminophen  650 mg Oral Once  . antiseptic oral rinse  7 mL Mouth Rinse q12n4p  . chlorhexidine  15 mL Mouth Rinse BID  . citalopram  10 mg Oral Daily  . insulin aspart  0-15 Units Subcutaneous TID WC  . levothyroxine  112 mcg Oral BID  . magic mouthwash w/lidocaine  5 mL Oral TID PC & HS  . simvastatin  20 mg Oral QHS  . sodium chloride  10-40 mL Intracatheter Q12H  . sucralfate  1 g Oral TID WC & HS  . vitamin B-12  1,000 mcg Oral Daily   Continuous Infusions: . sodium chloride 75 mL/hr at 12/13/14 2228   PRN Meds:.albuterol, gi cocktail, heparin lock flush, heparin lock flush, LORazepam, morphine injection, ondansetron (ZOFRAN) IVPB, ondansetron, oxyCODONE, senna-docusate, sodium chloride, sodium chloride  REVIEW OF SYSTEMS:     10 Point review of Systems was done is negative except as noted above.   LABORATORY DATA:  I have reviewed the data as listed  . CBC Latest Ref Rng 12/13/2014 12/12/2014 12/06/2014  WBC 4.0 - 10.5 K/uL 0.7(LL) 0.8(LL) 5.0  Hemoglobin 12.0 - 15.0 g/dL 9.9(L) 8.0(L) 10.0(L)  Hematocrit 36.0 - 46.0 % 27.9(L) 23.2(L) 29.3(L)  Platelets 150 - 400 K/uL 25(LL) 34(L) 174    . CMP Latest Ref Rng 12/13/2014 12/12/2014 12/06/2014  Glucose 65 - 99 mg/dL 114(H) 172(H) 207(H)  BUN 6 - 20 mg/dL 18 20.6 34.4(H)  Creatinine 0.44 - 1.00 mg/dL 0.78 1.0 1.1  Sodium 135 - 145 mmol/L 134(L) 134(L) 133(L)  Potassium 3.5 - 5.1 mmol/L 4.0 4.5 4.6  Chloride 101 - 111 mmol/L 101 - -  CO2 22 - 32 mmol/L '27 26 25  '$ Calcium 8.9 - 10.3 mg/dL 8.6(L) 9.5 9.0  Total Protein 6.4 - 8.3 g/dL - 6.4 6.4  Total Bilirubin 0.20 - 1.20 mg/dL - 0.72 0.68  Alkaline Phos 40 - 150 U/L - 62 61  AST 5 - 34 U/L - 8 9  ALT 0 - 55 U/L - 9 13     RADIOGRAPHIC STUDIES: I have personally reviewed the radiological images as listed and agreed with the findings in the report. Nm Pet Image Restag (ps) Skull Base To Thigh  11/29/2014  CLINICAL DATA:  Subsequent treatment strategy for Lung cancer. EXAM: NUCLEAR MEDICINE PET SKULL BASE TO THIGH TECHNIQUE: 7.26 mCi F-18 FDG was injected intravenously. Full-ring PET imaging was performed from the skull base to thigh after the radiotracer. CT data was obtained and used for attenuation correction and anatomic localization. FASTING BLOOD GLUCOSE:  Value: 214 mg/dl COMPARISON:  10/19/2014 FINDINGS: NECK No hypermetabolic lymph nodes in the neck. CHEST Diffuse uptake throughout both lobes of the thyroid gland noted. Aortic atherosclerosis noted. Small right pleural effusion is identified. New from previous exam. Large perihilar right lung mass has resolved in the interval. Residual soft tissue within the right paratracheal region measures 1.6 cm and has an SUV max equal to 3.61. On the previous  exam the SUV max within this area was equal to 15.01. Soft tissue within the sub- carinal region measures 1.2 cm on the current exam. This has an SUV max equal to 3.62. On the previous exam increased uptake within the sub- carinal region had an SUV max equal to 14.1. Increased uptake within the esophagus is identified at the level of the sub- carinal region which may reflect treatment related inflammation. No new areas of hypermetabolic tumor identified. Scarring  within the anterior and lateral aspect of the left upper lobe an anterior medial aspect of the right upper lobe is identified. ABDOMEN/PELVIS No abnormal hypermetabolic activity within the liver, pancreas, adrenal glands, or spleen. Gallstones noted. No hypermetabolic lymph nodes in the abdomen or pelvis. SKELETON There is diminished marrow activity identified involving the upper thoracic spine which may represent changes secondary to external beam radiation. Spondylosis within the lumbar spine is noted. 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 IMPRESSION: 1. Interval near complete metabolic response to therapy. Residual soft tissue in the right paratracheal and sub- carinal region exhibits nonspecific low level FDG uptake. 2. Increased uptake within the mid thoracic esophagus is favored to represent treatment related changes. 3. Gallstones 4. Lumbar spondylosis 5. Aortic atherosclerosis. Electronically Signed   By: Kerby Moors M.D.   On: 11/29/2014 10:51    ASSESSMENT & PLAN:    74 year old female with  #1 Limited stage small cell lung cancer status post 3 cycles of carboplatin and etoposide chemotherapy with ongoing concurrent radiation therapy. PET/CT scan after 2 cycles showed excellent response.  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 -Patient is status post PRBC transfusion with improvement in some dramatic anemia.  Transfuse when necessary for hemoglobin less than 8 or symptomatic. -Monitor for resolution of neutropenia.  Would hold off on G-CSF at this time given patient is still actively receiving radiation therapy.   -Patient will complete radiation therapy on 12/16/2014.  Anticipated that her radiation esophagitis will  Improve over 1-2 weeks after that. -transfuse platelets when necessary for platelets less than 20,000 or if patient bleeding. Higher threshold for prophylactic platelet transfusion given significant radiation esophagitis increased risk of bleeding from these ulcerations. -continue twice a week IV fluids as outpatient. - fourth cycle of chemotherapy scheduled for starting 12/21/2014... Might need to delay this based on her blood counts and overall clinical status.  Anticipated patient might be able to discharge home in the next day or so if symptoms well controlled and patient tolerating reasonable oral intake.  I spent 30 minutes counseling the patient face to face. The total time spent in the appointment was 40 minutes and more than 50% was on counseling and direct patient cares.    Sullivan Lone MD [place] AAHIVMS [facility] [address] [facility] Hematology/Oncology Physician [facility]  (Office):       [phone] (Work cell):  [phone] (Fax):           [phone]  12/14/2014 3:31 AM

## 2014-12-14 NOTE — Progress Notes (Signed)
OT Cancellation Note  Patient Details Name: Rachel Chapman MRN: [id number] DOB: 05-Jun-1940   Cancelled Treatment:    Reason Eval/Treat Not Completed: OT screened, no needs identified, will sign off .  Checked with pt:  No OT needs at this time. She has assistance as needed at home and moved well with PT.  Ioana Louks 12/14/2014, 11:57 AM  Lesle Chris, OTR/L [phone] 12/14/2014

## 2014-12-14 NOTE — Discharge Summary (Signed)
Physician Discharge Summary  Reyna Lorenzi DUK:[id number] DOB: 11-24-1940 DOA: 12/12/2014  PCP: [name], NP  Admit date: 12/12/2014 Discharge date: 12/14/2014  Recommendations for Outpatient Follow-up:  1. Pt will need to follow up with PCP in 1 week post discharge 2. Please obtain BMP to evaluate electrolytes and kidney function 3. Please also check CBC to evaluate Hg and Hct levels, Ple, WBC counts 4. 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 Last chemo treatment was ~2 weeks ago, last radiation therapy 10/31. Patient presented from cancer center to [facility] for evaluation of progressive weakness and failure to thrive. At the cancer center, pt had SBP in 60-70's requiring 1 L NS, blood work was notable for thrombocytopenia and [facility] asked to admit for further evaluation. Pt sees Dr. Irene Limbo at the cancer center, Dr. Isidore Moos radiation oncologist.   Assessment/Plan:    Principal Problem:  Hypotension - likely from progressive failure to thrive and poor oral intake - IVF have been provided and pt has responded well and BP has been stable since admission - stable for d/c from IM standpoint, oncology cleared for d/c  Active Problems:  Radiation-induced esophagitis - place on magic mouthwash - added GI cocktail    Antineoplastic chemotherapy induced pancytopenia ([place]) - blood counts still  low but improving  - per oncology, can not give Neupogen as pt on radiation therapy - no need for blood transfusion at this time - plt count is appropriately up after transfusion of one unit of Plt 11/01    Cancer of middle lobe of lung ([place]) - management per oncology team    Diabetes type 2, controlled ([place]) - reasonable inpatient control   Protein-calorie malnutrition - nutritionist consulted  - pt tolerating diet well so far    Hypothyroidism - continue synthroid    Dehydration - responding to IVF   Hyponatremia - improved with IVF  Code Status: Full.  Family Communication: plan of care discussed with the patient Disposition Plan: Home   IV access:  Peripheral IV  Procedures and diagnostic studies:   Nm Pet Image Restag (ps) Skull Base To Thigh 11/29/2014 Interval near complete metabolic response to therapy. Residual soft tissue in the right paratracheal and sub- carinal region exhibits nonspecific low level FDG uptake. 2. Increased uptake within the mid thoracic esophagus is favored to represent treatment related changes. 3. Gallstones 4. Lumbar spondylosis 5. Aortic atherosclerosis.  Medical Consultants:  Oncology  Other Consultants:  PT/OT  IAnti-Infectives:   None       Discharge Exam: Filed Vitals:   12/14/14 0540  BP: 164/67  Pulse: 90  Temp: 99.7 F (37.6 C)  Resp: 16   Filed Vitals:   12/13/14 1327 12/13/14 1625 12/13/14 2142 12/14/14 0540  BP: 164/77 124/62 167/71 164/67  Pulse: 101 88 96 90  Temp: 98.7 F (37.1 C) 99 F (37.2 C) 98.9 F (37.2 C) 99.7 F (37.6 C)  TempSrc: Oral Oral Oral Oral  Resp: '18 18 18 16  '$ Height:      Weight:      SpO2: 95% 96% 94% 93%    General: Pt is alert, follows commands appropriately, not in acute distress Cardiovascular: Regular rate and rhythm, no rubs, no gallops Respiratory: Clear to auscultation bilaterally, no wheezing, no crackles, no rhonchi Abdominal: Soft, non  tender, non distended, bowel sounds +, no guarding  Discharge Instructions  Discharge Instructions    Type and screen    Complete by:  Dec 12, 2014   [place] order/instruction    Complete by:  As directed   Transfuse Parameters     Complete patient signature process for consent form    Complete by:  As directed      Diet - low sodium heart healthy    Complete by:  As directed      Increase activity slowly    Complete by:  As directed      Practitioner attestation of consent    Complete by:  As directed   I, the ordering practitioner, attest that I have discussed with the patient the benefits, risks, side effects, alternatives, likelihood of achieving goals and potential problems during recovery for the procedure listed.  Procedure:  Blood Product(s)            Medication List    TAKE these medications        albuterol 108 (90 BASE) MCG/ACT inhaler  Commonly known as:  PROVENTIL HFA;VENTOLIN HFA  Inhale 2 puffs into the lungs every 6 (six) hours as needed for shortness of breath.     cholecalciferol 1000 UNITS tablet  Commonly known as:  VITAMIN D  Take 1 tablet (1,000 Units total) by mouth daily.     citalopram 10 MG tablet  Commonly known as:  CELEXA  Take 1 tablet (10 mg total) by mouth daily.     emollient cream  Commonly known as:  BIAFINE  Apply topically as needed.     gi cocktail Susp suspension  Take 30 mLs by mouth 3 (three) times daily as needed for indigestion. Each 30 mL contains 12 mL Maalox, 12 mL viscous lidocaine, 6 mL donnatal     glucose blood test strip  Commonly known as:  ONETOUCH VERIO  Test once daily.     levothyroxine 112 MCG tablet  Commonly known as:  SYNTHROID, LEVOTHROID  Take 1 tablet (112 mcg total) by mouth 2 (two) times daily.     lidocaine-prilocaine cream  Commonly known as:  EMLA  Apply topically once.     lisinopril 10 MG tablet  Commonly known as:  PRINIVIL,ZESTRIL  Take 1 tablet (10 mg  total) by mouth daily.     LORazepam 0.5 MG tablet  Commonly known as:  ATIVAN  Take 1 tablet (0.5 mg total) by mouth every 8 (eight) hours as needed for anxiety (or nausea). Could take 2 tab 30-45 mins prior to PET/CT and MRI Brain     magic mouthwash w/lidocaine Soln  Take 5 mLs by mouth 4 (four) times daily as needed for mouth pain.     ondansetron 4 MG disintegrating tablet  Commonly known as:  ZOFRAN-ODT  Take 4 mg by mouth every 8 (eight) hours as needed for nausea or vomiting.     ONETOUCH DELICA LANCETS FINE Misc  Test once daily.     oxyCODONE 5 MG/5ML solution  Commonly known as:  ROXICODONE  Take 5-10 mLs (5-10 mg total) by mouth every 4 (four) hours as needed for moderate pain, severe pain or breakthrough pain.     senna-docusate 8.6-50 MG tablet  Commonly known as:  SENNA S  Take 2 tablets by mouth at bedtime as needed for mild constipation.     simvastatin 20 MG tablet  Commonly known as:  ZOCOR  Take 1 tablet (20 mg total) by mouth at bedtime.     sitaGLIPtin 100 MG tablet  Commonly known as:  JANUVIA  Take 1 tablet (100 mg total) by mouth daily.     sucralfate 1 G tablet  Commonly known as:  CARAFATE  Dissolve 1 tablet in 10 mL H20 and swallow 5 min before meals     sucralfate 1 G tablet  Commonly known as:  CARAFATE  Take 1 tablet (1 g total) by mouth 4 (four) times daily -  with meals and at bedtime.     vitamin B-12 1000 MCG tablet  Commonly known as:  CYANOCOBALAMIN  Take 1 tablet (1,000 mcg total) by mouth daily.            Follow-up Information    Follow up with [name], NP.   Specialty:  Family Medicine   Contact information:   [address] [place] [address] [phone]       Call Faye Ramsay, MD.   Specialty:  Internal Medicine   Why:  As needed call my cell phone [phone]   Contact information:   [address] [place] [address] [phone]        The results of  significant diagnostics from this hospitalization (including imaging, microbiology, ancillary and laboratory) are listed below for reference.     Microbiology: No results found for this or any previous visit (from the past 240 hour(s)).   Labs: Basic Metabolic Panel:  Recent Labs Lab 12/12/14 1303 12/13/14 0800 12/14/14 0345  NA 134* 134* 133*  K 4.5 4.0 3.5  CL  --  101 100*  CO2 '26 27 29  '$ GLUCOSE 172* 114* 102*  BUN 20.'6 18 13  '$ CREATININE 1.0 0.78 0.74  CALCIUM 9.5 8.6* 8.5*  MG  --  1.7  --    Liver Function Tests:  Recent Labs Lab 12/12/14 1303  AST 8  ALT 9  ALKPHOS 62  BILITOT 0.72  PROT 6.4  ALBUMIN 3.2*   No results for input(s): LIPASE, AMYLASE in the last 168 hours. No results for input(s): AMMONIA in the last 168 hours. CBC:  Recent Labs Lab 12/12/14 1303 12/13/14 0800 12/14/14 0345  WBC 0.8* 0.7* 1.0*  NEUTROABS 0.4*  --  0.1*  HGB 8.0* 9.9* 9.1*  HCT 23.2* 27.9* 26.2*  MCV 86.9 86.9 85.3  PLT 34* 25* 62*    CBG:  Recent Labs Lab 12/13/14 0734 12/13/14 1330 12/13/14 1803 12/13/14 2146 12/14/14 0726  GLUCAP 109* 140* 100* 148* 101*     SIGNED: Time coordinating discharge: 30 minutes  MAGICK-Marranda Arakelian, MD  [facility] 12/14/2014, 11:02 AM Pager [phone]  If 7PM-7AM, please contact night-coverage [email or web address] Password [email or web address]

## 2014-12-14 NOTE — Discharge Instructions (Signed)
Anemia, Nonspecific Anemia is a condition in which the concentration of red blood cells or hemoglobin in the blood is below normal. Hemoglobin is a substance in red blood cells that carries oxygen to the tissues of the body. Anemia results in not enough oxygen reaching these tissues.  CAUSES  Common causes of anemia include:   Excessive bleeding. Bleeding may be internal or external. This includes excessive bleeding from periods (in women) or from the intestine.   Poor nutrition.   Chronic kidney, thyroid, and liver disease.  Bone marrow disorders that decrease red blood cell production.  Cancer and treatments for cancer.  HIV, AIDS, and their treatments.  Spleen problems that increase red blood cell destruction.  Blood disorders.  Excess destruction of red blood cells due to infection, medicines, and autoimmune disorders. SIGNS AND SYMPTOMS   Minor weakness.   Dizziness.   Headache.  Palpitations.   Shortness of breath, especially with exercise.   Paleness.  Cold sensitivity.  Indigestion.  Nausea.  Difficulty sleeping.  Difficulty concentrating. Symptoms may occur suddenly or they may develop slowly.  DIAGNOSIS  Additional blood tests are often needed. These help your health care provider determine the best treatment. Your health care provider will check your stool for blood and look for other causes of blood loss.  TREATMENT  Treatment varies depending on the cause of the anemia. Treatment can include:   Supplements of iron, vitamin B12, or folic acid.   Hormone medicines.   A blood transfusion. This may be needed if blood loss is severe.   Hospitalization. This may be needed if there is significant continual blood loss.   Dietary changes.  Spleen removal. HOME CARE INSTRUCTIONS Keep all follow-up appointments. It often takes many weeks to correct anemia, and having your health care provider check on your condition and your response to  treatment is very important. SEEK IMMEDIATE MEDICAL CARE IF:   You develop extreme weakness, shortness of breath, or chest pain.   You become dizzy or have trouble concentrating.  You develop heavy vaginal bleeding.   You develop a rash.   You have bloody or black, tarry stools.   You faint.   You vomit up blood.   You vomit repeatedly.   You have abdominal pain.  You have a fever or persistent symptoms for more than 2-3 days.   You have a fever and your symptoms suddenly get worse.   You are dehydrated.  MAKE SURE YOU:  Understand these instructions.  Will watch your condition.  Will get help right away if you are not doing well or get worse.   This information is not intended to replace advice given to you by your health care provider. Make sure you discuss any questions you have with your health care provider.   Document Released: 03/07/2004 Document Revised: 09/30/2012 Document Reviewed: 07/24/2012 Elsevier Interactive Patient Education 2016 [employer]  

## 2014-12-15 ENCOUNTER — Ambulatory Visit
Admission: RE | Admit: 2014-12-15 | Discharge: 2014-12-15 | Disposition: A | Discharge status: Home / Self Care | Payer: Medicare Other | Source: Ambulatory Visit | Attending: Radiation Oncology | Admitting: Radiation Oncology

## 2014-12-15 ENCOUNTER — Encounter: Payer: Self-pay | Admitting: Radiation Oncology

## 2014-12-15 ENCOUNTER — Ambulatory Visit: Payer: Medicare Other

## 2014-12-15 DIAGNOSIS — F17210 Nicotine dependence, cigarettes, uncomplicated: Secondary | ICD-10-CM | POA: Diagnosis not present

## 2014-12-15 DIAGNOSIS — C342 Malignant neoplasm of middle lobe, bronchus or lung: Secondary | ICD-10-CM | POA: Diagnosis present

## 2014-12-15 DIAGNOSIS — Z510 Encounter for antineoplastic radiation therapy: Secondary | ICD-10-CM | POA: Diagnosis present

## 2014-12-16 ENCOUNTER — Ambulatory Visit: Payer: Medicare Other

## 2014-12-16 ENCOUNTER — Ambulatory Visit (HOSPITAL_BASED_OUTPATIENT_CLINIC_OR_DEPARTMENT_OTHER): Payer: Medicare Other

## 2014-12-16 ENCOUNTER — Ambulatory Visit
Admission: RE | Admit: 2014-12-16 | Discharge: 2014-12-16 | Disposition: A | Discharge status: Home / Self Care | Payer: Medicare Other | Source: Ambulatory Visit | Attending: Radiation Oncology | Admitting: Radiation Oncology

## 2014-12-16 ENCOUNTER — Other Ambulatory Visit: Payer: Self-pay | Admitting: Hematology

## 2014-12-16 VITALS — BP 125/58 | HR 102 | Temp 98.0°F | Resp 18

## 2014-12-16 DIAGNOSIS — C3490 Malignant neoplasm of unspecified part of unspecified bronchus or lung: Secondary | ICD-10-CM | POA: Diagnosis present

## 2014-12-16 DIAGNOSIS — Z510 Encounter for antineoplastic radiation therapy: Secondary | ICD-10-CM | POA: Diagnosis not present

## 2014-12-16 MED ORDER — SODIUM CHLORIDE 0.9 % IJ SOLN
10.0000 mL | INTRAMUSCULAR | Status: DC | PRN
  Administered 2014-12-16: 10 mL
  Filled 2014-12-16: qty 10

## 2014-12-16 MED ORDER — HEPARIN SOD (PORK) LOCK FLUSH 100 UNIT/ML IV SOLN
500.0000 [IU] | Freq: Once | INTRAVENOUS | Status: AC | PRN
  Administered 2014-12-16: 500 [IU]
  Filled 2014-12-16: qty 5

## 2014-12-16 MED ORDER — SODIUM CHLORIDE 0.9 % IV SOLN
Freq: Once | INTRAVENOUS | Status: AC
  Administered 2014-12-16: 13:00:00 via INTRAVENOUS

## 2014-12-16 NOTE — Patient Instructions (Signed)

## 2014-12-16 NOTE — Progress Notes (Signed)
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  Diagnosis: Limited stage small cell lung cancer  Current treatment: Concurrent chemoradiation S/p 2 cycles of carboplatin and etoposide  HISTORY OF PRESENTING ILLNESS: (plz see my initial clinic note for details on initial presentation)  Interval History  Patient is here for her scheduled clinic followup prior to her third cycle of carboplatin and etoposide. Some mild throat soreness due to radiation therapy. Degrees oral intake. Notes significant fatigue due to symptomatic anemia for which she was set up for a blood transfusion. We discussed the PET/CT results which showed near-complete resolution of the FDG avidity of her lung mass. No fevers or chills. Has had some issues with dehydration and therefore was set up for twice-weekly IV fluids.    MEDICAL HISTORY:  Past Medical History  Diagnosis Date  . Hypertension   . Dyslipidemia   . Diabetes ([place])   . Hypothyroidism   . Pneumonia 2007  . Smoker     1.5 packs per day for 60 years.  Quit one month ago  . Anxiety   . Cancer ([place])   . Lung cancer ([place]) 8/216    hilar/mediastinum lung scca    SURGICAL HISTORY: Past Surgical History  Procedure Laterality Date  . Cesarean section      SOCIAL HISTORY: Social History   Social History  . Marital Status: Widowed    Spouse Name: N/A  . Number of Children: N/A  . Years of Education: N/A   Occupational History  . Not on file.   Social History Main Topics  . Smoking status: Former Smoker -- 1.50 packs/day for 60 years    Quit date: 09/12/2014  . Smokeless tobacco: Not on file  . Alcohol Use: No  . Drug Use: No  . Sexual Activity: No   Other Topics Concern  . Not on file   Social History Narrative   She has moved from [place]   She is retired from working with special education    Was married widowed  twice.         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 Clotting disorder Neg Hx   . Cancer Mother     lung non smoker  . Cancer Sister     colon  . Diabetes Sister   . Diabetes Mother     ALLERGIES:  has No Known Allergies.  MEDICATIONS:  Current Outpatient Prescriptions  Medication Sig Dispense Refill  . albuterol (PROVENTIL HFA;VENTOLIN HFA) 108 (90 BASE) MCG/ACT inhaler Inhale 2 puffs into the lungs every 6 (six) hours as needed for shortness of breath. 3.7 g 3  . cholecalciferol (VITAMIN D) 1000 UNITS tablet Take 1 tablet (1,000 Units total) by mouth daily. 90 tablet 3  . citalopram (CELEXA) 10 MG tablet Take 1 tablet (10 mg total) by mouth daily. 30 tablet 3  . emollient (BIAFINE) cream Apply topically as needed. 454 g 0  . glucose blood (ONETOUCH VERIO) test strip Test once daily. (Patient taking differently: 1 each by Other route. One to two times daily,) 100 each 5  . levothyroxine (SYNTHROID, LEVOTHROID) 112 MCG tablet Take 1 tablet (112 mcg total) by mouth 2 (two) times daily. 90 tablet 3  . lidocaine-prilocaine (EMLA) cream Apply topically once. 30 each 2  . lisinopril (PRINIVIL,ZESTRIL) 10 MG tablet Take 1  tablet (10 mg total) by mouth daily. 90 tablet 1  . magic mouthwash w/lidocaine SOLN Take 5 mLs by mouth 4 (four) times daily as needed for mouth pain. 200 mL 0  . ondansetron (ZOFRAN-ODT) 4 MG disintegrating tablet Take 4 mg by mouth every 8 (eight) hours as needed for nausea or vomiting.    Glory Rosebush DELICA LANCETS FINE MISC Test once daily. 100 each 5  . senna-docusate (SENNA S) 8.6-50 MG tablet Take 2 tablets by mouth at bedtime as needed for mild constipation. 60 tablet 1  . simvastatin (ZOCOR) 20 MG tablet Take 1 tablet (20 mg total) by mouth at bedtime. 90 tablet 3  . sitaGLIPtin (JANUVIA) 100 MG tablet Take 1 tablet (100 mg total) by mouth daily. 30 tablet 3  . vitamin B-12  (CYANOCOBALAMIN) 1000 MCG tablet Take 1 tablet (1,000 mcg total) by mouth daily. 90 tablet 3  . Alum & Mag Hydroxide-Simeth (GI COCKTAIL) SUSP suspension Take 30 mLs by mouth 3 (three) times daily as needed for indigestion. Each 30 mL contains 12 mL Maalox, 12 mL viscous lidocaine, 6 mL donnatal 500 mL 1  . LORazepam (ATIVAN) 0.5 MG tablet Take 1 tablet (0.5 mg total) by mouth every 8 (eight) hours as needed for anxiety (or nausea). Could take 2 tab 30-45 mins prior to PET/CT and MRI Brain 60 tablet 0  . oxyCODONE (ROXICODONE) 5 MG/5ML solution Take 5-10 mLs (5-10 mg total) by mouth every 4 (four) hours as needed for moderate pain, severe pain or breakthrough pain. 473 mL 0  . sucralfate (CARAFATE) 1 G tablet Dissolve 1 tablet in 10 mL H20 and swallow 5 min before meals 60 tablet 5  . sucralfate (CARAFATE) 1 G tablet Take 1 tablet (1 g total) by mouth 4 (four) times daily -  with meals and at bedtime. 90 tablet 1   No current facility-administered medications for this visit.   Facility-Administered Medications Ordered in Other Visits  Medication Dose Route Frequency Provider Last Rate Last Dose  . sodium chloride 0.9 % injection 10 mL  10 mL Intracatheter PRN [name], MD   10 mL at 11/11/14 1131  . sodium chloride 0.9 % injection 10 mL  10 mL Intracatheter PRN [name], MD   10 mL at 12/16/14 1451     REVIEW OF SYSTEMS:    10 Point review of Systems was done is negative except as noted above.  PHYSICAL EXAMINATION: ECOG PERFORMANCE STATUS: 1 - Symptomatic but completely ambulatory  . Filed Vitals:   11/30/14 1017  Height: 5' 3" (1.6 m)  Weight: 146 lb 12.8 oz (66.588 kg)   Filed Weights   11/30/14 1017  Weight: 146 lb 12.8 oz (66.588 kg)   .Body mass index is 26.01 kg/(m^2).  GENERAL:alert, in no acute distress but anxious SKIN: skin color, texture, turgor are normal, no rashes or significant lesions EYES: normal, conjunctiva are pink and non-injected,  sclera clear OROPHARYNX:no exudate, no erythema and lips, buccal mucosa, and tongue normal  NECK: supple, no JVD, thyroid normal size, non-tender, without nodularity LYMPH:  no palpable lymphadenopathy in the cervical, axillary or inguinal LUNGS: clear to auscultation with normal respiratory effort HEART: regular rate & rhythm,  no murmurs and no lower extremity edema ABDOMEN: abdomen soft, non-tender, normoactive bowel sounds  Musculoskeletal: no cyanosis of digits and no clubbing  PSYCH: alert & oriented x 3 with fluent speech NEURO: no focal motor/sensory deficits  LABORATORY DATA:  I have reviewed the data  as listed  Component     Latest Ref Rng 11/30/2014  WBC     3.9 - 10.3 10e3/uL 4.7  NEUT#     1.5 - 6.5 10e3/uL 2.6  Hemoglobin     11.6 - 15.9 g/dL 6.7 (LL)  HCT     34.8 - 46.6 % 20.4 (L)  Platelets     145 - 400 10e3/uL 192  MCV     79.5 - 101.0 fL 93.2  MCH     25.1 - 34.0 pg 30.6  MCHC     31.5 - 36.0 g/dL 32.8  RBC     3.70 - 5.45 10e6/uL 2.19 (L)  RDW     11.2 - 14.5 % 19.3 (H)  lymph#     0.9 - 3.3 10e3/uL 0.9  MONO#     0.1 - 0.9 10e3/uL 1.2 (H)  Eosinophils Absolute     0.0 - 0.5 10e3/uL 0.1  Basophils Absolute     0.0 - 0.1 10e3/uL 0.0  NEUT%     38.4 - 76.8 % 55.7  LYMPH%     14.0 - 49.7 % 18.2  MONO%     0.0 - 14.0 % 24.6 (H)  EOS%     0.0 - 7.0 % 1.3  BASO%     0.0 - 2.0 % 0.2  Retic %     0.70 - 2.10 % 6.91 (H)  Retic Ct Abs     33.70 - 90.70 10e3/uL 151.33 (H)  Immature Retic Fract     1.60 - 10.00 % 24.80 (H)  Sodium     136 - 145 mEq/L 137  Potassium     3.5 - 5.1 mEq/L 4.5  Chloride     98 - 109 mEq/L 107  CO2     22 - 29 mEq/L 24  Glucose     70 - 140 mg/dl 235 (H)  BUN     7.0 - 26.0 mg/dL 11.4  Creatinine     0.6 - 1.1 mg/dL 1.0  Total Bilirubin     0.20 - 1.20 mg/dL <0.30  Alkaline Phosphatase     40 - 150 U/L 61  AST     5 - 34 U/L 12  ALT     0 - 55 U/L 15  Total Protein     6.4 - 8.3 g/dL 5.9 (L)    Albumin     3.5 - 5.0 g/dL 2.9 (L)  Calcium     8.4 - 10.4 mg/dL 8.6  Anion gap     3 - 11 mEq/L 6  EGFR     >90 ml/min/1.73 m2 54 (L)  LDH     125 - 245 U/L 195    RADIOGRAPHIC STUDIES:  CT chest without contrast [09/26/2014]-[facility] at [facility].  There is left apical scarring with fibrous retraction that is grossly unchanged from the prior examination. There is a tree in bud opacification within the anterior aspect of the right upper lung field measuring 8 mm x 6 mm. There is a large paratracheal lymph node mass that measures 2.3 cm x 2.8 cm with extension into the right hilum.  There is a 2.9 cm subcarinal lymphadenopathy. Gallstones noted within the gallbladder. Impression Mediastinal lymphadenopathy indicative of neoplasm.  9 mm right apical nodule.  Apical pleural based thickening, left-sided, chronic in nature.  [facility] pathology report specimen #[id number] Final diagnosis A] subcarinal lymph node #1 ultrasound-guided cell block- Small cell carcinoma B]  subcarinal lymph node #2 ultrasound-guided cell block- Small cell carcinoma  Comment: Immunohistochemistry studies were performed and results of both the diagnosis positive for CD 56, pancytokeratin and synaptophysin.  Negative for chromogranin, CD3 and CD 20.  Rachel KitchenNm Pet Image Restag (ps) Skull Base To Thigh  11/29/2014  CLINICAL DATA:  Subsequent treatment strategy for Lung cancer. EXAM: NUCLEAR MEDICINE PET SKULL BASE TO THIGH TECHNIQUE: 7.26 mCi F-18 FDG was injected intravenously. Full-ring PET imaging was performed from the skull base to thigh after the radiotracer. CT data was obtained and used for attenuation correction and anatomic localization. FASTING BLOOD GLUCOSE:  Value: 214 mg/dl COMPARISON:  10/19/2014 FINDINGS: NECK No hypermetabolic lymph nodes in the neck. CHEST Diffuse uptake throughout both lobes of the thyroid gland noted. Aortic atherosclerosis noted. Small  right pleural effusion is identified. New from previous exam. Large perihilar right lung mass has resolved in the interval. Residual soft tissue within the right paratracheal region measures 1.6 cm and has an SUV max equal to 3.61. On the previous exam the SUV max within this area was equal to 15.01. Soft tissue within the sub- carinal region measures 1.2 cm on the current exam. This has an SUV max equal to 3.62. On the previous exam increased uptake within the sub- carinal region had an SUV max equal to 14.1. Increased uptake within the esophagus is identified at the level of the sub- carinal region which may reflect treatment related inflammation. No new areas of hypermetabolic tumor identified. Scarring within the anterior and lateral aspect of the left upper lobe an anterior medial aspect of the right upper lobe is identified. ABDOMEN/PELVIS No abnormal hypermetabolic activity within the liver, pancreas, adrenal glands, or spleen. Gallstones noted. No hypermetabolic lymph nodes in the abdomen or pelvis. SKELETON There is diminished marrow activity identified involving the upper thoracic spine which may represent changes secondary to external beam radiation. Spondylosis within the lumbar spine is noted. 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 IMPRESSION: 1. Interval near complete metabolic response to therapy. Residual soft tissue in the right paratracheal and sub- carinal region exhibits nonspecific low level FDG uptake. 2. Increased uptake within the mid thoracic esophagus is favored to represent treatment related changes. 3. Gallstones 4. Lumbar spondylosis 5. Aortic atherosclerosis. Electronically Signed   By: Kerby Moors M.D.   On: 11/29/2014 10:51   ASSESSMENT & PLAN:   74 year old female with history of heavy smoking with  #1 Limited stage Small cell lung cancer. CT scan on 09/26/2014 at the outside hospital showed mediastinal adenopathy which was bronchoscopically  biopsied and noted to be consistent with small cell lung cancer. No overt focal neurological deficits or symptoms suggestive of brain metastases at this time. PET/CT showed bulky hypermetabolic mediastinal disease consistent with limited stage small cell lung cancer. MRI brain with no overt CNS mets  patient is currently status post 2 cycles of carboplatin etoposide and is currently getting concrete current IMRT until 12/16/2014. PET/CT scan for restaging shows near complete resolution of the small cell lung cancer FDG avidity which shows good response.  #2 normocytic normochromic anemia related to chemotherapy.this is symptomatic and significant with a hemoglobin of 6.7  #3 alopecia related to chemotherapy Plan -We'll transfuse the patient 2 units of blood for symptomatic anemia related to chemotherapy and radiation .no overt bleeding was noted. Patient stable to proceed with 3rd cycle  of carboplatin + Etoposide tomorrow with 20% also reduction on the carboplatin due to cytopenias. -Continue  as scheduled followup for her scheduled RT. -Magic mouthwash for radiation associated esophagitis . -IV fluids twice weekly to avoid dehydration. -prn Ativan for nausea or anxiety .  #4 protein calorie malnutrition and weight loss due to small cell lung cancer. -Management of radiation esophagitis with when necessary Magic mouthwash -If worse we will likely need an oral narcotic -Scheduled  #5 ex-smoker 90-pack-year history of smoking at one month ago. Continues to stay off her cigarettes.  #4 likely COPD -- has never been diagnosed as such.  He has been recently given an albuterol inhaler as needed.  Not oxygen dependent.  #6 hyperkalemia  - resolved on rpt labs  #7 hypothyroidism -continue with levothyroxine replacement  #8 Diabetes patient has been controlled with oral hypoglycemics.  She has lost about 15-20 pounds. Plan -Continue Januvia. -off her metformin to reduce its anorexic  effects. -Patient will monitor blood sugars at home  #8 severe anxiety.  Patient was having insomnia and difficulty with relaxing. Patient appears much improved on Celexa and when necessary lorazepam. Plan -continue on Celexa for long-term management of generalized anxiety. -continue lorazepam when necessary for acute anxiety attacks  RTC in 2 weeks with Dr Irene Limbo for clinical reassessment. Earlier if any other acute questions or concerns .  I spent 25 minutes counseling the patient face to face. The total time spent in the appointment was 30 minutes and more than 50% was on counseling and direct patient cares.    Sullivan Lone MD [place] AAHIVMS [facility] Hematology/Oncology Physician [facility]  (Office):       [phone] (Work cell):  [phone] (Fax):           [phone] 10/20/2014

## 2014-12-19 ENCOUNTER — Telehealth: Payer: Self-pay | Admitting: *Deleted

## 2014-12-19 ENCOUNTER — Ambulatory Visit: Payer: Medicare Other

## 2014-12-19 ENCOUNTER — Telehealth: Payer: Self-pay

## 2014-12-19 NOTE — Telephone Encounter (Signed)
Transition Care Management Follow-up Telephone Call  How have you been since you were released from the hospital? Finished radiation on Friday. Had fluids again due toblood pressure. Able to eat last night okay.    Do you understand why you were in the hospital? Yes   Do you understand the discharge instrcutions? No  Items Reviewed:  Medications reviewed: Using magic mouth wash more -- Oxycodone Rx   Allergies reviewed: NKDA  Dietary changes reviewed: No   Referrals reviewed: No    Functional Questionnaire:   Activities of Daily Living (ADLs):   She states they are independent in the following: bathroom okay, going and making food in kitchen, dress self   States they require assistance with the following: walk to mailbox, "pretty much okay with getting around"   Any transportation issues/concerns?: No    Any patient concerns? No; make sure to tell patient significance of maintaining fluids and food. -- (Consider something like Ensure or Breeze drinks)   Confirmed importance and date/time of follow-up visits scheduled: Yes   Confirmed with patient if condition begins to worsen call PCP or go to the ER.  Patient was given the Call-a-Nurse line [phone]: Yes

## 2014-12-19 NOTE — Telephone Encounter (Signed)
error 

## 2014-12-20 ENCOUNTER — Encounter: Payer: Self-pay | Admitting: Adult Health

## 2014-12-20 ENCOUNTER — Ambulatory Visit (INDEPENDENT_AMBULATORY_CARE_PROVIDER_SITE_OTHER): Payer: Medicare Other | Admitting: Adult Health

## 2014-12-20 VITALS — BP 110/78 | Ht 62.0 in | Wt 139.9 lb

## 2014-12-20 DIAGNOSIS — R630 Anorexia: Secondary | ICD-10-CM | POA: Diagnosis not present

## 2014-12-20 DIAGNOSIS — Z09 Encounter for follow-up examination after completed treatment for conditions other than malignant neoplasm: Secondary | ICD-10-CM

## 2014-12-20 DIAGNOSIS — R070 Pain in throat: Secondary | ICD-10-CM

## 2014-12-20 DIAGNOSIS — R638 Other symptoms and signs concerning food and fluid intake: Secondary | ICD-10-CM | POA: Diagnosis not present

## 2014-12-20 DIAGNOSIS — D649 Anemia, unspecified: Secondary | ICD-10-CM

## 2014-12-20 MED ORDER — MIRTAZAPINE 15 MG PO TABS: 7.5000 mg | ORAL_TABLET | Freq: Every day | ORAL | Status: DC

## 2014-12-20 MED ORDER — LIDOCAINE VISCOUS 2 % MT SOLN: 15.0000 mL | OROMUCOSAL | Status: DC | PRN

## 2014-12-20 NOTE — Progress Notes (Signed)
Subjective:    Patient ID: Rachel Chapman, female    DOB: 03/22/1940, 74 y.o.   MRN: [id number]  HPI  74 year old female who presents to the office for transitional care follow up. She was sent to the hospital from the cancer center on 12/14/2014 and was discharged on the same day for evaluation of progressive weakness and failure to thrive. At the cancer center, pt had SBP in 60-70's requiring 1 L NS, blood work was notable for thrombocytopenia and TRH asked to admit for further evaluation.  Principal Problem:  Hypotension - likely from progressive failure to thrive and poor oral intake - IVF have been provided and pt has responded well and BP has been stable since admission - stable for d/c from IM standpoint, oncology cleared for d/c  Active Problems:  Radiation-induced esophagitis - place on magic mouthwash - added GI cocktail    Antineoplastic chemotherapy induced pancytopenia ([place]) - blood counts still low but improving  - per oncology, can not give Neupogen as pt on radiation therapy - no need for blood transfusion at this time - plt count is appropriately up after transfusion of one unit of Plt 11/01    She has finished her radiation on 12/16/2014. She is to finish her chemo this week if she can handle the therapy this week. If not she will finish the following week.   Her PET scan on 11/29/2014 showed that  IMPRESSION: 1. Interval near complete metabolic response to therapy. Residual soft tissue in the right paratracheal and sub- carinal region exhibits nonspecific low level FDG uptake. 2. Increased uptake within the mid thoracic esophagus is favored to represent treatment related changes. 3. Gallstones 4. Lumbar spondylosis 5. Aortic atherosclerosis.  She has an additional PET scan scheduled following completion of chemotherapy  Today in the office she presents with her daughter and son. Her family is concerned about lack of appetitte and fluid intake. She  continues to have poor PO intake. She states " I just don't feel like eating or drinking and my throat is sore." Last night she went out to eat and had two pieces of pizza, family endorses that this is the most she has had to eat in awhile.   She endorses that she is always tired and feels like " I could sleep for days."  She does appear to be in good spirits and jokes around during this office visit. She is hopefully that after this week she will no longer have to do chemo or radiation.   Review of Systems  Constitutional: Positive for activity change, appetite change, fatigue and unexpected weight change. Negative for fever and chills.  Respiratory: Negative.   Cardiovascular: Negative.   Gastrointestinal: Negative.   Musculoskeletal: Positive for myalgias and arthralgias. Negative for back pain.  Neurological: Negative.   Psychiatric/Behavioral: Positive for sleep disturbance.  All other systems reviewed and are negative.  Past Medical History  Diagnosis Date  . Hypertension   . Dyslipidemia   . Diabetes ([place])   . Hypothyroidism   . Pneumonia 2007  . Smoker     1.5 packs per day for 60 years.  Quit one month ago  . Anxiety   . Cancer ([place])   . Lung cancer ([place]) 8/216    hilar/mediastinum lung scca    Social History   Social History  . Marital Status: Widowed    Spouse Name: N/A  . Number of Children: N/A  . Years of Education: N/A   Occupational  History  . Not on file.   Social History Main Topics  . Smoking status: Former Smoker -- 1.50 packs/day for 60 years    Quit date: 09/12/2014  . Smokeless tobacco: Not on file  . Alcohol Use: No  . Drug Use: No  . Sexual Activity: No   Other Topics Concern  . Not on file   Social History Narrative   She has moved from [place]   She is retired from working with special education    Was married widowed twice.           Past Surgical History  Procedure Laterality Date  . Cesarean section      Family History    Problem Relation Age of Onset  . Clotting disorder Neg Hx   . Cancer Mother     lung non smoker  . Cancer Sister     colon  . Diabetes Sister   . Diabetes Mother     No Known Allergies  Current Outpatient Prescriptions on File Prior to Visit  Medication Sig Dispense Refill  . albuterol (PROVENTIL HFA;VENTOLIN HFA) 108 (90 BASE) MCG/ACT inhaler Inhale 2 puffs into the lungs every 6 (six) hours as needed for shortness of breath. 3.7 g 3  . Alum & Mag Hydroxide-Simeth (GI COCKTAIL) SUSP suspension Take 30 mLs by mouth 3 (three) times daily as needed for indigestion. Each 30 mL contains 12 mL Maalox, 12 mL viscous lidocaine, 6 mL donnatal 500 mL 1  . cholecalciferol (VITAMIN D) 1000 UNITS tablet Take 1 tablet (1,000 Units total) by mouth daily. 90 tablet 3  . citalopram (CELEXA) 10 MG tablet Take 1 tablet (10 mg total) by mouth daily. 30 tablet 3  . emollient (BIAFINE) cream Apply topically as needed. 454 g 0  . glucose blood (ONETOUCH VERIO) test strip Test once daily. (Patient taking differently: 1 each by Other route. One to two times daily,) 100 each 5  . levothyroxine (SYNTHROID, LEVOTHROID) 112 MCG tablet Take 1 tablet (112 mcg total) by mouth 2 (two) times daily. 90 tablet 3  . lidocaine-prilocaine (EMLA) cream Apply topically once. 30 each 2  . lisinopril (PRINIVIL,ZESTRIL) 10 MG tablet Take 1 tablet (10 mg total) by mouth daily. 90 tablet 1  . LORazepam (ATIVAN) 0.5 MG tablet Take 1 tablet (0.5 mg total) by mouth every 8 (eight) hours as needed for anxiety (or nausea). Could take 2 tab 30-45 mins prior to PET/CT and MRI Brain 60 tablet 0  . magic mouthwash w/lidocaine SOLN Take 5 mLs by mouth 4 (four) times daily as needed for mouth pain. 200 mL 0  . ondansetron (ZOFRAN-ODT) 4 MG disintegrating tablet Take 4 mg by mouth every 8 (eight) hours as needed for nausea or vomiting.    Glory Rosebush DELICA LANCETS FINE MISC Test once daily. 100 each 5  . oxyCODONE (ROXICODONE) 5 MG/5ML  solution Take 5-10 mLs (5-10 mg total) by mouth every 4 (four) hours as needed for moderate pain, severe pain or breakthrough pain. 473 mL 0  . senna-docusate (SENNA S) 8.6-50 MG tablet Take 2 tablets by mouth at bedtime as needed for mild constipation. 60 tablet 1  . simvastatin (ZOCOR) 20 MG tablet Take 1 tablet (20 mg total) by mouth at bedtime. 90 tablet 3  . sitaGLIPtin (JANUVIA) 100 MG tablet Take 1 tablet (100 mg total) by mouth daily. 30 tablet 3  . sucralfate (CARAFATE) 1 G tablet Dissolve 1 tablet in 10 mL H20 and swallow 5 min before  meals 60 tablet 5  . sucralfate (CARAFATE) 1 G tablet Take 1 tablet (1 g total) by mouth 4 (four) times daily -  with meals and at bedtime. 90 tablet 1  . vitamin B-12 (CYANOCOBALAMIN) 1000 MCG tablet Take 1 tablet (1,000 mcg total) by mouth daily. 90 tablet 3   Current Facility-Administered Medications on File Prior to Visit  Medication Dose Route Frequency Provider Last Rate Last Dose  . sodium chloride 0.9 % injection 10 mL  10 mL Intracatheter PRN [name], MD   10 mL at 11/11/14 1131    BP 110/78 mmHg  Ht '5\' 2"'$  (1.575 m)  Wt 139 lb 14.4 oz (63.458 kg)  BMI 25.58 kg/m2       Objective:   Physical Exam  Constitutional: She is oriented to person, place, and time. She appears well-developed and well-nourished. She appears distressed (tired and worn out).  HENT:  Head: Normocephalic and atraumatic.  Right Ear: External ear normal.  Left Ear: External ear normal.  Nose: Nose normal.  Mouth/Throat: Oropharynx is clear and moist. No oropharyngeal exudate.  No thrush noted  Eyes: Conjunctivae and EOM are normal. Pupils are equal, round, and reactive to light. Right eye exhibits no discharge. Left eye exhibits no discharge.  Neck: Normal range of motion. Neck supple. No thyromegaly present.  Cardiovascular: Normal rate, regular rhythm, normal heart sounds and intact distal pulses.  Exam reveals no gallop and no friction rub.   No murmur  heard. Pulmonary/Chest: Effort normal and breath sounds normal. No respiratory distress. She has no wheezes. She has no rales. She exhibits no tenderness.  Abdominal: Soft. Bowel sounds are normal. She exhibits no distension and no mass. There is no tenderness. There is no rebound and no guarding.  Musculoskeletal: Normal range of motion. She exhibits no edema or tenderness.  Lymphadenopathy:    She has no cervical adenopathy.  Neurological: She is alert and oriented to person, place, and time.  Skin: Skin is warm and dry. No rash noted. She is not diaphoretic. No erythema. There is pallor.  Psychiatric: She has a normal mood and affect. Her behavior is normal. Judgment and thought content normal.  Nursing note and vitals reviewed.      Assessment & Plan:  1. Hospital discharge follow-up - Basic metabolic panel; Future - CBC with Differential/Platelet; Future - Follow up after chemo is finished or sooner if needed 2. Poor fluid intake - Force fluids and stay well hydrated - mirtazapine (REMERON) 15 MG tablet; Take 0.5 tablets (7.5 mg total) by mouth at bedtime.  Dispense: 30 tablet; Refill: 0 - Basic metabolic panel; Future - CBC with Differential/Platelet; Future  3. Appetite loss - No restrictions currently on diet.  - mirtazapine (REMERON) 15 MG tablet; Take 0.5 tablets (7.5 mg total) by mouth at bedtime.  Dispense: 30 tablet; Refill: 0 - Basic metabolic panel; Future - CBC with Differential/Platelet; Future Wt Readings from Last 3 Encounters:  12/20/14 139 lb 14.4 oz (63.458 kg)  12/12/14 142 lb 9.6 oz (64.683 kg)  12/12/14 140 lb 3.2 oz (63.594 kg)     4. Throat pain - Mix with 5-10 ml Mylanta - lidocaine (XYLOCAINE) 2 % solution; Use as directed 15 mLs in the mouth or throat every 3 (three) hours as needed for mouth pain. Gargle and spit every three hours as needed.  Dispense: 100 mL; Refill: 0  5. Anemia, unspecified anemia type - Basic metabolic panel; Future - CBC  with Differential/Platelet; Future

## 2014-12-21 ENCOUNTER — Encounter: Payer: Self-pay | Admitting: Hematology

## 2014-12-21 ENCOUNTER — Ambulatory Visit (HOSPITAL_BASED_OUTPATIENT_CLINIC_OR_DEPARTMENT_OTHER): Payer: Medicare Other | Admitting: Hematology

## 2014-12-21 ENCOUNTER — Other Ambulatory Visit: Payer: Self-pay | Admitting: *Deleted

## 2014-12-21 ENCOUNTER — Other Ambulatory Visit (HOSPITAL_BASED_OUTPATIENT_CLINIC_OR_DEPARTMENT_OTHER): Payer: Medicare Other

## 2014-12-21 ENCOUNTER — Ambulatory Visit: Payer: Medicare Other

## 2014-12-21 ENCOUNTER — Telehealth: Payer: Self-pay | Admitting: Adult Health

## 2014-12-21 ENCOUNTER — Telehealth: Payer: Self-pay | Admitting: Hematology

## 2014-12-21 ENCOUNTER — Telehealth: Payer: Self-pay | Admitting: *Deleted

## 2014-12-21 DIAGNOSIS — K5641 Fecal impaction: Secondary | ICD-10-CM | POA: Diagnosis not present

## 2014-12-21 DIAGNOSIS — K6289 Other specified diseases of anus and rectum: Secondary | ICD-10-CM | POA: Diagnosis present

## 2014-12-21 DIAGNOSIS — C3490 Malignant neoplasm of unspecified part of unspecified bronchus or lung: Secondary | ICD-10-CM

## 2014-12-21 DIAGNOSIS — K208 Other esophagitis: Secondary | ICD-10-CM | POA: Diagnosis not present

## 2014-12-21 DIAGNOSIS — T66XXXA Radiation sickness, unspecified, initial encounter: Secondary | ICD-10-CM

## 2014-12-21 DIAGNOSIS — D649 Anemia, unspecified: Secondary | ICD-10-CM | POA: Diagnosis not present

## 2014-12-21 LAB — COMPREHENSIVE METABOLIC PANEL (CC13)
AST: 9 U/L (ref 5–34)
Albumin: 3 g/dL — ABNORMAL LOW (ref 3.5–5.0)
Alkaline Phosphatase: 58 U/L (ref 40–150)
Anion Gap: 10 mEq/L (ref 3–11)
BILIRUBIN TOTAL: 0.46 mg/dL (ref 0.20–1.20)
BUN: 16.4 mg/dL (ref 7.0–26.0)
CO2: 28 meq/L (ref 22–29)
CREATININE: 1.1 mg/dL (ref 0.6–1.1)
Calcium: 9.4 mg/dL (ref 8.4–10.4)
Chloride: 99 mEq/L (ref 98–109)
EGFR: 46 mL/min/{1.73_m2} — ABNORMAL LOW (ref >90)
GLUCOSE: 172 mg/dL — AB (ref 70–140)
Potassium: 3.6 mEq/L (ref 3.5–5.1)
SODIUM: 136 meq/L (ref 136–145)
TOTAL PROTEIN: 6.4 g/dL (ref 6.4–8.3)

## 2014-12-21 LAB — CBC & DIFF AND RETIC
BASO%: 0.2 % (ref 0.0–2.0)
BASOS ABS: 0 10*3/uL (ref 0.0–0.1)
EOS ABS: 0.1 10*3/uL (ref 0.0–0.5)
EOS%: 0.9 % (ref 0.0–7.0)
HCT: 30.7 % — ABNORMAL LOW (ref 34.8–46.6)
HGB: 10.6 g/dL — ABNORMAL LOW (ref 11.6–15.9)
IMMATURE RETIC FRACT: 10.8 % — AB (ref 1.60–10.00)
LYMPH#: 0.5 10*3/uL — AB (ref 0.9–3.3)
LYMPH%: 4 % — ABNORMAL LOW (ref 14.0–49.7)
MCH: 30.5 pg (ref 25.1–34.0)
MCHC: 34.5 g/dL (ref 31.5–36.0)
MCV: 88.2 fL (ref 79.5–101.0)
MONO#: 1.6 10*3/uL — ABNORMAL HIGH (ref 0.1–0.9)
MONO%: 13.5 % (ref 0.0–14.0)
NEUT%: 81.4 % — ABNORMAL HIGH (ref 38.4–76.8)
NEUTROS ABS: 9.5 10*3/uL — AB (ref 1.5–6.5)
PLATELETS: 128 10*3/uL — AB (ref 145–400)
RBC: 3.48 10*6/uL — AB (ref 3.70–5.45)
RDW: 16 % — AB (ref 11.2–14.5)
RETIC CT ABS: 104.05 10*3/uL — AB (ref 33.70–90.70)
Retic %: 2.99 % — ABNORMAL HIGH (ref 0.70–2.10)
WBC: 11.6 10*3/uL — AB (ref 3.9–10.3)

## 2014-12-21 LAB — MAGNESIUM (CC13): Magnesium: 1.8 mg/dl (ref 1.5–2.5)

## 2014-12-21 MED ORDER — DIBUCAINE 1 % EX OINT: TOPICAL_OINTMENT | Freq: Three times a day (TID) | CUTANEOUS | Status: DC | PRN

## 2014-12-21 NOTE — Telephone Encounter (Signed)
Per staff message and POF I have scheduled appts. Advised scheduler of appts and first available given. JMW  

## 2014-12-21 NOTE — Telephone Encounter (Signed)
per pof to sch pt appt-sent MW email to sch trmt-pt to get updated copy b4 leaving trmt room

## 2014-12-21 NOTE — Telephone Encounter (Signed)
Daughter ask for a call back before scheduling labs

## 2014-12-21 NOTE — Progress Notes (Signed)
Pt cancelled per MD

## 2014-12-22 ENCOUNTER — Ambulatory Visit: Payer: Medicare Other

## 2014-12-22 NOTE — Telephone Encounter (Signed)
Called and spoke with pt's daughter Langley Gauss.  Pt was seen at the cancer center on 11.09.2016 and had labs drawn.  Langley Gauss wanted to make sure these were not the same labs [name] wanted done.  Advised Langley Gauss I would have [name] review the labs as well because the ones ordered by the cancer center were the same labs [place] wanted. No need to have these done again.  Langley Gauss verbalized understanding and is aware that we will call with results if needed.

## 2014-12-22 NOTE — Telephone Encounter (Signed)
noted 

## 2014-12-23 ENCOUNTER — Ambulatory Visit: Payer: Medicare Other

## 2014-12-24 ENCOUNTER — Ambulatory Visit: Payer: Medicare Other

## 2014-12-27 NOTE — Progress Notes (Signed)
Rachel Chapman  HEMATOLOGY ONCOLOGY PROGRESS NOTE  Date of service: .12/21/2014  Patient Care Team: [name], NP as PCP - General (Family Medicine)  Diagnosis: Limited stage small cell lung cancer  Current Treatment:   Status post 3 cycles of carboplatin and etoposide Status post Concurrent radiation therapy completed 12/16/2014.  INTERVAL HISTORY:  Patient is here for follow-up in clinic for her planned fourth cycle of carboplatin and etoposide chemotherapy. She completed her: Current radiation therapy on 12/16/2014. She notes that her radiation esophagitis pain is well controlled with the current regimen and that she has been eating much better at home. Notes issues with constipation despite using the senna. She reports that she has had reversed to defecate repeatedly without much success. She is having some rectal discomfort with walking. Minimal bleeding. No fevers or chills. Notes that she is quite uncomfortable and is not sure if she might have a hemorrhoid. She was examined with my nurse as a chaperone. After using a local anesthetic gel a rectal examination was done which revealed significant stool impaction. She was manually disimpacted to completion and some lidocaine gel was injected. Her rectal pain was completely resolved and she was much more comfortable after this. She chooses to postpone her next cycle of chemotherapy by a week until she feels stronger which is quite reasonable.   REVIEW OF SYSTEMS:    10 Point review of systems of done and is negative except as noted above.  . Past Medical History  Diagnosis Date  . Hypertension   . Dyslipidemia   . Diabetes ([place])   . Hypothyroidism   . Pneumonia 2007  . Smoker     1.5 packs per day for 60 years.  Quit one month ago  . Anxiety   . Cancer ([place])   . Lung cancer ([place]) 8/216    hilar/mediastinum lung scca    . Past Surgical History  Procedure Laterality Date  . Cesarean section      . Social History  Substance Use  Topics  . Smoking status: Former Smoker -- 1.50 packs/day for 60 years    Quit date: 09/12/2014  . Smokeless tobacco: None  . Alcohol Use: No    ALLERGIES:  has No Known Allergies.  MEDICATIONS:  Current Outpatient Prescriptions  Medication Sig Dispense Refill  . albuterol (PROVENTIL HFA;VENTOLIN HFA) 108 (90 BASE) MCG/ACT inhaler Inhale 2 puffs into the lungs every 6 (six) hours as needed for shortness of breath. 3.7 g 3  . Alum & Mag Hydroxide-Simeth (GI COCKTAIL) SUSP suspension Take 30 mLs by mouth 3 (three) times daily as needed for indigestion. Each 30 mL contains 12 mL Maalox, 12 mL viscous lidocaine, 6 mL donnatal 500 mL 1  . cholecalciferol (VITAMIN D) 1000 UNITS tablet Take 1 tablet (1,000 Units total) by mouth daily. 90 tablet 3  . citalopram (CELEXA) 10 MG tablet Take 1 tablet (10 mg total) by mouth daily. 30 tablet 3  . dibucaine (NUPERCAINAL) 1 % ointment Apply topically 3 (three) times daily as needed for pain (anal pain from hemorrhoids/fissure). 30 g 0  . emollient (BIAFINE) cream Apply topically as needed. 454 g 0  . glucose blood (ONETOUCH VERIO) test strip Test once daily. (Patient taking differently: 1 each by Other route. One to two times daily,) 100 each 5  . levothyroxine (SYNTHROID, LEVOTHROID) 112 MCG tablet Take 1 tablet (112 mcg total) by mouth 2 (two) times daily. 90 tablet 3  . lidocaine (XYLOCAINE) 2 % solution Use as directed 15  mLs in the mouth or throat every 3 (three) hours as needed for mouth pain. Gargle and spit every three hours as needed. 100 mL 0  . lidocaine-prilocaine (EMLA) cream Apply topically once. 30 each 2  . lisinopril (PRINIVIL,ZESTRIL) 10 MG tablet Take 1 tablet (10 mg total) by mouth daily. 90 tablet 1  . LORazepam (ATIVAN) 0.5 MG tablet Take 1 tablet (0.5 mg total) by mouth every 8 (eight) hours as needed for anxiety (or nausea). Could take 2 tab 30-45 mins prior to PET/CT and MRI Brain 60 tablet 0  . magic mouthwash w/lidocaine SOLN Take  5 mLs by mouth 4 (four) times daily as needed for mouth pain. 200 mL 0  . mirtazapine (REMERON) 15 MG tablet Take 0.5 tablets (7.5 mg total) by mouth at bedtime. 30 tablet 0  . ondansetron (ZOFRAN-ODT) 4 MG disintegrating tablet Take 4 mg by mouth every 8 (eight) hours as needed for nausea or vomiting.    Glory Rosebush DELICA LANCETS FINE MISC Test once daily. 100 each 5  . oxyCODONE (ROXICODONE) 5 MG/5ML solution Take 5-10 mLs (5-10 mg total) by mouth every 4 (four) hours as needed for moderate pain, severe pain or breakthrough pain. 473 mL 0  . senna-docusate (SENNA S) 8.6-50 MG tablet Take 2 tablets by mouth at bedtime as needed for mild constipation. 60 tablet 1  . simvastatin (ZOCOR) 20 MG tablet Take 1 tablet (20 mg total) by mouth at bedtime. 90 tablet 3  . sitaGLIPtin (JANUVIA) 100 MG tablet Take 1 tablet (100 mg total) by mouth daily. 30 tablet 3  . sucralfate (CARAFATE) 1 G tablet Dissolve 1 tablet in 10 mL H20 and swallow 5 min before meals 60 tablet 5  . sucralfate (CARAFATE) 1 G tablet Take 1 tablet (1 g total) by mouth 4 (four) times daily -  with meals and at bedtime. 90 tablet 1  . vitamin B-12 (CYANOCOBALAMIN) 1000 MCG tablet Take 1 tablet (1,000 mcg total) by mouth daily. 90 tablet 3   No current facility-administered medications for this visit.   Facility-Administered Medications Ordered in Other Visits  Medication Dose Route Frequency Provider Last Rate Last Dose  . sodium chloride 0.9 % injection 10 mL  10 mL Intracatheter PRN [name], MD   10 mL at 11/11/14 1131    PHYSICAL EXAMINATION: ECOG PERFORMANCE STATUS: 2 - Symptomatic, <50% confined to bed  .There were no vitals filed for this visit.  There were no vitals filed for this visit. .There is no weight on file to calculate BMI.  GENERAL:alert, in no acute distress and comfortable SKIN: skin color, texture, turgor are normal, no rashes or significant lesions EYES: normal, conjunctiva are pink and  non-injected, sclera clear OROPHARYNX:no exudate, no erythema and lips, buccal mucosa, and tongue normal  NECK: supple, no JVD, thyroid normal size, non-tender, without nodularity LYMPH:  no palpable lymphadenopathy in the cervical, axillary or inguinal LUNGS: clear to auscultation with normal respiratory effort HEART: regular rate & rhythm,  no murmurs and no lower extremity edema ABDOMEN: abdomen soft, non-tender, normoactive bowel sounds  Musculoskeletal: no cyanosis of digits and no clubbing  PSYCH: alert & oriented x 3 with fluent speech NEURO: no focal motor/sensory deficits Rectal examination showed impacted stools, no overt large hemorrhoids, no overt bleeding. Manual fecal disimpaction performed.  LABORATORY DATA:   I have reviewed the data as listed  . CBC Latest Ref Rng 12/21/2014 12/14/2014 12/13/2014  WBC 3.9 - 10.3 10e3/uL 11.6(H) 1.0(LL) 0.7(LL)  Hemoglobin 11.6 - 15.9 g/dL 10.6(L) 9.1(L) 9.9(L)  Hematocrit 34.8 - 46.6 % 30.7(L) 26.2(L) 27.9(L)  Platelets 145 - 400 10e3/uL 128(L) 62(L) 25(LL)    . CMP Latest Ref Rng 12/21/2014 12/14/2014 12/13/2014  Glucose 70 - 140 mg/dl 172(H) 102(H) 114(H)  BUN 7.0 - 26.0 mg/dL 16.'4 13 18  '$ Creatinine 0.6 - 1.1 mg/dL 1.1 0.74 0.78  Sodium 136 - 145 mEq/L 136 133(L) 134(L)  Potassium 3.5 - 5.1 mEq/L 3.6 3.5 4.0  Chloride 101 - 111 mmol/L - 100(L) 101  CO2 22 - 29 mEq/L '28 29 27  '$ Calcium 8.4 - 10.4 mg/dL 9.4 8.5(L) 8.6(L)  Total Protein 6.4 - 8.3 g/dL 6.4 - -  Total Bilirubin 0.20 - 1.20 mg/dL 0.46 - -  Alkaline Phos 40 - 150 U/L 58 - -  AST 5 - 34 U/L 9 - -  ALT 0 - 55 U/L <9 - -     RADIOGRAPHIC STUDIES: I have personally reviewed the radiological images as listed and agreed with the findings in the report. Nm Pet Image Restag (ps) Skull Base To Thigh  11/29/2014  CLINICAL DATA:  Subsequent treatment strategy for Lung cancer. EXAM: NUCLEAR MEDICINE PET SKULL BASE TO THIGH TECHNIQUE: 7.26 mCi F-18 FDG was injected  intravenously. Full-ring PET imaging was performed from the skull base to thigh after the radiotracer. CT data was obtained and used for attenuation correction and anatomic localization. FASTING BLOOD GLUCOSE:  Value: 214 mg/dl COMPARISON:  10/19/2014 FINDINGS: NECK No hypermetabolic lymph nodes in the neck. CHEST Diffuse uptake throughout both lobes of the thyroid gland noted. Aortic atherosclerosis noted. Small right pleural effusion is identified. New from previous exam. Large perihilar right lung mass has resolved in the interval. Residual soft tissue within the right paratracheal region measures 1.6 cm and has an SUV max equal to 3.61. On the previous exam the SUV max within this area was equal to 15.01. Soft tissue within the sub- carinal region measures 1.2 cm on the current exam. This has an SUV max equal to 3.62. On the previous exam increased uptake within the sub- carinal region had an SUV max equal to 14.1. Increased uptake within the esophagus is identified at the level of the sub- carinal region which may reflect treatment related inflammation. No new areas of hypermetabolic tumor identified. Scarring within the anterior and lateral aspect of the left upper lobe an anterior medial aspect of the right upper lobe is identified. ABDOMEN/PELVIS No abnormal hypermetabolic activity within the liver, pancreas, adrenal glands, or spleen. Gallstones noted. No hypermetabolic lymph nodes in the abdomen or pelvis. SKELETON There is diminished marrow activity identified involving the upper thoracic spine which may represent changes secondary to external beam radiation. Spondylosis within the lumbar spine is noted. 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 IMPRESSION: 1. Interval near complete metabolic response to therapy. Residual soft tissue in the right paratracheal and sub- carinal region exhibits nonspecific low level FDG uptake. 2. Increased uptake within the mid thoracic  esophagus is favored to represent treatment related changes. 3. Gallstones 4. Lumbar spondylosis 5. Aortic atherosclerosis. Electronically Signed   By: Kerby Moors M.D.   On: 11/29/2014 10:51    ASSESSMENT & PLAN:   74 year old female with history of heavy smoking with  #1 Limited stage Small cell lung cancer. CT scan on 09/26/2014 at the outside hospital showed mediastinal adenopathy which was bronchoscopically biopsied and noted to be consistent with small cell lung cancer. No  overt focal neurological deficits or symptoms suggestive of brain metastases at this time. PET/CT showed bulky hypermetabolic mediastinal disease consistent with limited stage small cell lung cancer. MRI brain with no overt CNS mets patient is currently status post 3 cycles of carboplatin etoposide and is completed concurrent IMRT on 02/14/2014. PET/CT scan for restaging shows near complete resolution of the small cell lung cancer FDG avidity which shows good response.  #2 normocytic normochromic anemia related to chemotherapy.this is symptomatic and significant with a hemoglobin of 6.7  #3 alopecia related to chemotherapy #4 radiation esophagitis-resolving. Pain control with oxycodone and Magic mouthwash. Much improved appetite #5 pancytopenia related to chemoradiation-much improved counts today. #6 newly noted rectal pain with difficulty ambulating and rectal urgency due to impacted stools -resolved status post manual stool disimpaction today by me in the presence of a nurse chaperone. #7 protein calorie malnutrition and weight loss due to small cell lung cancer. #8 ex-smoker 90-pack-year history of smoking at one month ago. Continues to stay off her cigarettes. #9 COPD -- has never been diagnosed as such. He has been recently given an albuterol inhaler as needed. Not oxygen dependent. #10 hyperkalemia - resolved on rpt labs #11 hypothyroidism -continue with levothyroxine replacement #12 Diabetes patient has  been controlled with oral hypoglycemics. She has lost about 15-20 pounds. #13 severe anxiety.Patient appears much improved on Celexa and when necessary lorazepam. Plan  -Patient having rectal pain due to significant fecal impaction. We treated this with a manual disimpaction today. -Improving radiation esophagitis continue Magic mouthwash, when necessary oxycodone and sucralfate. -Encourage by mouth intake and adequate fluids -Continue laxatives -Given topical dibucaine prescription for some rectal pain. -Blood counts stable improved today but the patient chooses to delay chemotherapy by a week to allow for recovery from side effects of concurrent chemoradiation which is reasonable. -Rescheduled for follow-up on 12/28/2014 with labs and chemotherapy appointment. -We will plan to do 5-6 cycles of carboplatin and etoposide if tolerated. -After that the patient will need to have a discussion with radiation oncology to determine if she will pursue prophylactic cranial radiation. -Patient will need repeat PET/CT scan and MRI of the brain after completion of chemotherapy.  I spent 25 minutes counseling the patient face to face. The total time spent in the appointment was 25 minutes and more than 50% was on counseling and direct patient cares.    Sullivan Lone MD [facility] AAHIVMS [facility] Hematology/Oncology Physician [facility]  (Office):       [phone] (Work cell):  [phone] (Fax):           [phone]

## 2014-12-28 ENCOUNTER — Other Ambulatory Visit (HOSPITAL_BASED_OUTPATIENT_CLINIC_OR_DEPARTMENT_OTHER): Payer: Medicare Other

## 2014-12-28 ENCOUNTER — Ambulatory Visit: Payer: Medicare Other | Admitting: Hematology

## 2014-12-28 ENCOUNTER — Encounter: Payer: Self-pay | Admitting: Hematology

## 2014-12-28 ENCOUNTER — Ambulatory Visit (HOSPITAL_BASED_OUTPATIENT_CLINIC_OR_DEPARTMENT_OTHER): Payer: Medicare Other | Admitting: Hematology

## 2014-12-28 ENCOUNTER — Other Ambulatory Visit: Payer: Medicare Other

## 2014-12-28 ENCOUNTER — Ambulatory Visit: Payer: Medicare Other

## 2014-12-28 ENCOUNTER — Ambulatory Visit (HOSPITAL_BASED_OUTPATIENT_CLINIC_OR_DEPARTMENT_OTHER): Payer: Medicare Other

## 2014-12-28 VITALS — BP 107/58 | HR 92 | Temp 98.2°F | Resp 18 | Wt 136.5 lb

## 2014-12-28 DIAGNOSIS — Z95828 Presence of other vascular implants and grafts: Secondary | ICD-10-CM

## 2014-12-28 DIAGNOSIS — K209 Esophagitis, unspecified: Secondary | ICD-10-CM

## 2014-12-28 DIAGNOSIS — D649 Anemia, unspecified: Secondary | ICD-10-CM

## 2014-12-28 DIAGNOSIS — E039 Hypothyroidism, unspecified: Secondary | ICD-10-CM

## 2014-12-28 DIAGNOSIS — F419 Anxiety disorder, unspecified: Secondary | ICD-10-CM

## 2014-12-28 DIAGNOSIS — C3490 Malignant neoplasm of unspecified part of unspecified bronchus or lung: Secondary | ICD-10-CM

## 2014-12-28 DIAGNOSIS — Z5111 Encounter for antineoplastic chemotherapy: Secondary | ICD-10-CM | POA: Diagnosis present

## 2014-12-28 DIAGNOSIS — D61810 Antineoplastic chemotherapy induced pancytopenia: Secondary | ICD-10-CM | POA: Diagnosis not present

## 2014-12-28 DIAGNOSIS — C3491 Malignant neoplasm of unspecified part of right bronchus or lung: Secondary | ICD-10-CM

## 2014-12-28 DIAGNOSIS — E46 Unspecified protein-calorie malnutrition: Secondary | ICD-10-CM

## 2014-12-28 DIAGNOSIS — T451X5A Adverse effect of antineoplastic and immunosuppressive drugs, initial encounter: Secondary | ICD-10-CM

## 2014-12-28 LAB — CBC & DIFF AND RETIC
BASO%: 0.3 % (ref 0.0–2.0)
Basophils Absolute: 0 10*3/uL (ref 0.0–0.1)
EOS%: 0.3 % (ref 0.0–7.0)
Eosinophils Absolute: 0 10*3/uL (ref 0.0–0.5)
HCT: 29.8 % — ABNORMAL LOW (ref 34.8–46.6)
HGB: 10 g/dL — ABNORMAL LOW (ref 11.6–15.9)
Immature Retic Fract: 13.2 % — ABNORMAL HIGH (ref 1.60–10.00)
LYMPH%: 14.7 % (ref 14.0–49.7)
MCH: 30.4 pg (ref 25.1–34.0)
MCHC: 33.6 g/dL (ref 31.5–36.0)
MCV: 90.6 fL (ref 79.5–101.0)
MONO#: 0.9 10*3/uL (ref 0.1–0.9)
MONO%: 15.7 % — AB (ref 0.0–14.0)
NEUT%: 69 % (ref 38.4–76.8)
NEUTROS ABS: 4 10*3/uL (ref 1.5–6.5)
Platelets: 205 10*3/uL (ref 145–400)
RBC: 3.29 10*6/uL — AB (ref 3.70–5.45)
RDW: 16.8 % — ABNORMAL HIGH (ref 11.2–14.5)
Retic %: 2.73 % — ABNORMAL HIGH (ref 0.70–2.10)
Retic Ct Abs: 89.82 10*3/uL (ref 33.70–90.70)
WBC: 5.9 10*3/uL (ref 3.9–10.3)
lymph#: 0.9 10*3/uL (ref 0.9–3.3)

## 2014-12-28 LAB — COMPREHENSIVE METABOLIC PANEL (CC13)
ALBUMIN: 2.9 g/dL — AB (ref 3.5–5.0)
ALK PHOS: 51 U/L (ref 40–150)
ALT: 10 U/L (ref 0–55)
AST: 14 U/L (ref 5–34)
Anion Gap: 7 mEq/L (ref 3–11)
BILIRUBIN TOTAL: 0.4 mg/dL (ref 0.20–1.20)
BUN: 15.2 mg/dL (ref 7.0–26.0)
CO2: 27 mEq/L (ref 22–29)
Calcium: 8.9 mg/dL (ref 8.4–10.4)
Chloride: 104 mEq/L (ref 98–109)
Creatinine: 1 mg/dL (ref 0.6–1.1)
EGFR: 52 mL/min/{1.73_m2} — ABNORMAL LOW (ref >90)
GLUCOSE: 137 mg/dL (ref 70–140)
POTASSIUM: 3.8 meq/L (ref 3.5–5.1)
SODIUM: 138 meq/L (ref 136–145)
TOTAL PROTEIN: 6.2 g/dL — AB (ref 6.4–8.3)

## 2014-12-28 LAB — MAGNESIUM (CC13): Magnesium: 2.1 mg/dl (ref 1.5–2.5)

## 2014-12-28 MED ORDER — SODIUM CHLORIDE 0.9 % IV SOLN
INTRAVENOUS | Status: AC
  Administered 2014-12-28: 15:00:00 via INTRAVENOUS

## 2014-12-28 MED ORDER — SODIUM CHLORIDE 0.9 % IV SOLN
366.0000 mg | Freq: Once | INTRAVENOUS | Status: AC
  Administered 2014-12-28: 370 mg via INTRAVENOUS
  Filled 2014-12-28: qty 37

## 2014-12-28 MED ORDER — SODIUM CHLORIDE 0.9 % IJ SOLN
10.0000 mL | INTRAMUSCULAR | Status: DC | PRN
  Administered 2014-12-28: 10 mL via INTRAVENOUS
  Filled 2014-12-28: qty 10

## 2014-12-28 MED ORDER — POLYETHYLENE GLYCOL 3350 17 G PO PACK: 17.0000 g | PACK | Freq: Every day | ORAL | Status: DC

## 2014-12-28 MED ORDER — SODIUM CHLORIDE 0.9 % IV SOLN
Freq: Once | INTRAVENOUS | Status: AC
  Administered 2014-12-28: 15:00:00 via INTRAVENOUS
  Filled 2014-12-28: qty 8

## 2014-12-28 MED ORDER — HEPARIN SOD (PORK) LOCK FLUSH 100 UNIT/ML IV SOLN
500.0000 [IU] | Freq: Once | INTRAVENOUS | Status: AC | PRN
  Administered 2014-12-28: 500 [IU]
  Filled 2014-12-28: qty 5

## 2014-12-28 MED ORDER — SODIUM CHLORIDE 0.9 % IV SOLN
100.0000 mg/m2 | Freq: Once | INTRAVENOUS | Status: AC
  Administered 2014-12-28: 170 mg via INTRAVENOUS
  Filled 2014-12-28: qty 8.5

## 2014-12-28 MED ORDER — SODIUM CHLORIDE 0.9 % IJ SOLN
10.0000 mL | INTRAMUSCULAR | Status: DC | PRN
  Administered 2014-12-28: 10 mL
  Filled 2014-12-28: qty 10

## 2014-12-28 NOTE — Patient Instructions (Addendum)
[facility] Discharge Instructions for Patients Receiving Chemotherapy  Today you received the following chemotherapy agents Carboplatin and Etoposide. To help prevent nausea and vomiting after your treatment, we encourage you to take your nausea medication as directed.  If you develop nausea and vomiting that is not controlled by your nausea medication, call the clinic.   BELOW ARE SYMPTOMS THAT SHOULD BE REPORTED IMMEDIATELY:  *FEVER GREATER THAN 100.5 F  *CHILLS WITH OR WITHOUT FEVER  NAUSEA AND VOMITING THAT IS NOT CONTROLLED WITH YOUR NAUSEA MEDICATION  *UNUSUAL SHORTNESS OF BREATH  *UNUSUAL BRUISING OR BLEEDING  TENDERNESS IN MOUTH AND THROAT WITH OR WITHOUT PRESENCE OF ULCERS  *URINARY PROBLEMS  *BOWEL PROBLEMS  UNUSUAL RASH Items with * indicate a potential emergency and should be followed up as soon as possible.  Feel free to call the clinic you have any questions or concerns. The clinic phone number is [phone].  Please show the [facility] at check-in to the Emergency Department and triage nurse.  Dehydration, Adult Dehydration is when you lose more fluids from the body than you take in. Vital organs like the kidneys, brain, and heart cannot function without a proper amount of fluids and salt. Any loss of fluids from the body can cause dehydration.  CAUSES   Vomiting.  Diarrhea.  Excessive sweating.  Excessive urine output.  Fever. SYMPTOMS  Mild dehydration  Thirst.  Dry lips.  Slightly dry mouth. Moderate dehydration  Very dry mouth.  Sunken eyes.  Skin does not bounce back quickly when lightly pinched and released.  Dark urine and decreased urine production.  Decreased tear production.  Headache. Severe dehydration  Very dry mouth.  Extreme thirst.  Rapid, weak pulse (more than 100 beats per minute at rest).  Cold hands and feet.  Not able to sweat in spite of heat and temperature.  Rapid  breathing.  Blue lips.  Confusion and lethargy.  Difficulty being awakened.  Minimal urine production.  No tears. DIAGNOSIS  Your caregiver will diagnose dehydration based on your symptoms and your exam. Blood and urine tests will help confirm the diagnosis. The diagnostic evaluation should also identify the cause of dehydration. TREATMENT  Treatment of mild or moderate dehydration can often be done at home by increasing the amount of fluids that you drink. It is best to drink small amounts of fluid more often. Drinking too much at one time can make vomiting worse. Refer to the home care instructions below. Severe dehydration needs to be treated at the hospital where you will probably be given intravenous (IV) fluids that contain water and electrolytes. HOME CARE INSTRUCTIONS   Ask your caregiver about specific rehydration instructions.  Drink enough fluids to keep your urine clear or pale yellow.  Drink small amounts frequently if you have nausea and vomiting.  Eat as you normally do.  Avoid:  Foods or drinks high in sugar.  Carbonated drinks.  Juice.  Extremely hot or cold fluids.  Drinks with caffeine.  Fatty, greasy foods.  Alcohol.  Tobacco.  Overeating.  Gelatin desserts.  Wash your hands well to avoid spreading bacteria and viruses.  Only take over-the-counter or prescription medicines for pain, discomfort, or fever as directed by your caregiver.  Ask your caregiver if you should continue all prescribed and over-the-counter medicines.  Keep all follow-up appointments with your caregiver. SEEK MEDICAL CARE IF:  You have abdominal pain and it increases or stays in one area (localizes).  You have a rash, stiff  neck, or severe headache.  You are irritable, sleepy, or difficult to awaken.  You are weak, dizzy, or extremely thirsty. SEEK IMMEDIATE MEDICAL CARE IF:   You are unable to keep fluids down or you get worse despite treatment.  You have  frequent episodes of vomiting or diarrhea.  You have blood or green matter (bile) in your vomit.  You have blood in your stool or your stool looks black and tarry.  You have not urinated in 6 to 8 hours, or you have only urinated a small amount of very dark urine.  You have a fever.  You faint. MAKE SURE YOU:   Understand these instructions.  Will watch your condition.  Will get help right away if you are not doing well or get worse. Document Released: 01/28/2005 Document Revised: 04/22/2011 Document Reviewed: 09/17/2010 [facility] Patient Information 2015 [place], [place]. This information is not intended to replace advice given to you by your health care provider. Make sure you discuss any questions you have with your health care provider.

## 2014-12-28 NOTE — Patient Instructions (Signed)

## 2014-12-29 ENCOUNTER — Ambulatory Visit (HOSPITAL_BASED_OUTPATIENT_CLINIC_OR_DEPARTMENT_OTHER): Payer: Medicare Other

## 2014-12-29 VITALS — BP 166/89 | HR 100 | Temp 97.6°F | Resp 16

## 2014-12-29 DIAGNOSIS — Z5111 Encounter for antineoplastic chemotherapy: Secondary | ICD-10-CM | POA: Diagnosis present

## 2014-12-29 DIAGNOSIS — C3490 Malignant neoplasm of unspecified part of unspecified bronchus or lung: Secondary | ICD-10-CM | POA: Diagnosis not present

## 2014-12-29 MED ORDER — HEPARIN SOD (PORK) LOCK FLUSH 100 UNIT/ML IV SOLN
500.0000 [IU] | Freq: Once | INTRAVENOUS | Status: AC | PRN
  Administered 2014-12-29: 500 [IU]
  Filled 2014-12-29: qty 5

## 2014-12-29 MED ORDER — PROCHLORPERAZINE MALEATE 10 MG PO TABS
ORAL_TABLET | ORAL | Status: AC
  Filled 2014-12-29: qty 1

## 2014-12-29 MED ORDER — SODIUM CHLORIDE 0.9 % IJ SOLN
10.0000 mL | INTRAMUSCULAR | Status: DC | PRN
  Administered 2014-12-29: 10 mL
  Filled 2014-12-29: qty 10

## 2014-12-29 MED ORDER — SODIUM CHLORIDE 0.9 % IV SOLN
Freq: Once | INTRAVENOUS | Status: AC
  Administered 2014-12-29: 14:00:00 via INTRAVENOUS

## 2014-12-29 MED ORDER — PROCHLORPERAZINE MALEATE 10 MG PO TABS
10.0000 mg | ORAL_TABLET | Freq: Once | ORAL | Status: AC
  Administered 2014-12-29: 10 mg via ORAL

## 2014-12-29 MED ORDER — SODIUM CHLORIDE 0.9 % IV SOLN
100.0000 mg/m2 | Freq: Once | INTRAVENOUS | Status: AC
  Administered 2014-12-29: 170 mg via INTRAVENOUS
  Filled 2014-12-29: qty 8.5

## 2014-12-29 NOTE — Patient Instructions (Signed)
[facility] Discharge Instructions for Patients Receiving Chemotherapy  Today you received the following chemotherapy agents etoposide  To help prevent nausea and vomiting after your treatment, we encourage you to take your nausea medication   If you develop nausea and vomiting that is not controlled by your nausea medication, call the clinic.   BELOW ARE SYMPTOMS THAT SHOULD BE REPORTED IMMEDIATELY:  *FEVER GREATER THAN 100.5 F  *CHILLS WITH OR WITHOUT FEVER  NAUSEA AND VOMITING THAT IS NOT CONTROLLED WITH YOUR NAUSEA MEDICATION  *UNUSUAL SHORTNESS OF BREATH  *UNUSUAL BRUISING OR BLEEDING  TENDERNESS IN MOUTH AND THROAT WITH OR WITHOUT PRESENCE OF ULCERS  *URINARY PROBLEMS  *BOWEL PROBLEMS  UNUSUAL RASH Items with * indicate a potential emergency and should be followed up as soon as possible.  Feel free to call the clinic you have any questions or concerns. The clinic phone number is [phone].  Please show the [facility] at check-in to the Emergency Department and triage nurse.

## 2014-12-30 ENCOUNTER — Ambulatory Visit (HOSPITAL_BASED_OUTPATIENT_CLINIC_OR_DEPARTMENT_OTHER): Payer: Medicare Other

## 2014-12-30 VITALS — BP 168/91 | HR 103 | Temp 97.7°F | Resp 18

## 2014-12-30 DIAGNOSIS — C3490 Malignant neoplasm of unspecified part of unspecified bronchus or lung: Secondary | ICD-10-CM | POA: Diagnosis not present

## 2014-12-30 DIAGNOSIS — Z5111 Encounter for antineoplastic chemotherapy: Secondary | ICD-10-CM | POA: Diagnosis present

## 2014-12-30 MED ORDER — SODIUM CHLORIDE 0.9 % IV SOLN
100.0000 mg/m2 | Freq: Once | INTRAVENOUS | Status: AC
  Administered 2014-12-30: 170 mg via INTRAVENOUS
  Filled 2014-12-30: qty 8.5

## 2014-12-30 MED ORDER — PROCHLORPERAZINE MALEATE 10 MG PO TABS
ORAL_TABLET | ORAL | Status: AC
  Filled 2014-12-30: qty 1

## 2014-12-30 MED ORDER — SODIUM CHLORIDE 0.9 % IV SOLN
1000.0000 mL | Freq: Once | INTRAVENOUS | Status: DC
Start: 2014-12-30 — End: 2014-12-30

## 2014-12-30 MED ORDER — SODIUM CHLORIDE 0.9 % IJ SOLN
10.0000 mL | INTRAMUSCULAR | Status: DC | PRN
  Administered 2014-12-30: 10 mL
  Filled 2014-12-30: qty 10

## 2014-12-30 MED ORDER — PROCHLORPERAZINE MALEATE 10 MG PO TABS
10.0000 mg | ORAL_TABLET | Freq: Once | ORAL | Status: AC
  Administered 2014-12-30: 10 mg via ORAL

## 2014-12-30 MED ORDER — HEPARIN SOD (PORK) LOCK FLUSH 100 UNIT/ML IV SOLN
500.0000 [IU] | Freq: Once | INTRAVENOUS | Status: AC | PRN
Start: 2014-12-30 — End: 2014-12-30
  Administered 2014-12-30: 500 [IU]
  Filled 2014-12-30: qty 5

## 2014-12-30 MED ORDER — SODIUM CHLORIDE 0.9 % IV SOLN
Freq: Once | INTRAVENOUS | Status: AC
  Administered 2014-12-30: 14:00:00 via INTRAVENOUS

## 2014-12-30 NOTE — Patient Instructions (Signed)
[facility] Discharge Instructions for Patients Receiving Chemotherapy  Today you received the following chemotherapy agents: Etoposide   To help prevent nausea and vomiting after your treatment, we encourage you to take your nausea medication as directed.    If you develop nausea and vomiting that is not controlled by your nausea medication, call the clinic.   BELOW ARE SYMPTOMS THAT SHOULD BE REPORTED IMMEDIATELY:  *FEVER GREATER THAN 100.5 F  *CHILLS WITH OR WITHOUT FEVER  NAUSEA AND VOMITING THAT IS NOT CONTROLLED WITH YOUR NAUSEA MEDICATION  *UNUSUAL SHORTNESS OF BREATH  *UNUSUAL BRUISING OR BLEEDING  TENDERNESS IN MOUTH AND THROAT WITH OR WITHOUT PRESENCE OF ULCERS  *URINARY PROBLEMS  *BOWEL PROBLEMS  UNUSUAL RASH Items with * indicate a potential emergency and should be followed up as soon as possible.  Feel free to call the clinic you have any questions or concerns. The clinic phone number is [phone].  Please show the CHEMO ALERT CARD at check-in to the Emergency Department and triage nurse.   

## 2014-12-30 NOTE — Progress Notes (Signed)
  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 60 Gy in 30 fractions.  Beams/energy: IMRT-Helical // 6X  Narrative: The patient tolerated radiation treatment relatively well but had poor PO intake, dehydration, and weight loss.  Patient was encouraged to stay for IV fluids (which she allowed, at times)  and nutritionist support.    Plan: The patient has completed radiation treatment. The patient will return to radiation oncology clinic for routine followup in one month. I advised them to call or return sooner if they have any questions or concerns related to their recovery or treatment.  -----------------------------------  [name], MD  This document serves as a record of services personally performed by [name], MD. It was created on her behalf by Darcus Austin, a trained medical scribe. The creation of this record is based on the scribe's personal observations and the provider's statements to them. This document has been checked and approved by the attending provider.

## 2015-01-03 ENCOUNTER — Telehealth: Payer: Self-pay | Admitting: *Deleted

## 2015-01-03 ENCOUNTER — Other Ambulatory Visit: Payer: Self-pay | Admitting: *Deleted

## 2015-01-03 DIAGNOSIS — K208 Other esophagitis without bleeding: Secondary | ICD-10-CM

## 2015-01-03 MED ORDER — OXYCODONE HCL 5 MG/5ML PO SOLN: 5.0000 mg | ORAL | Status: DC | PRN

## 2015-01-03 NOTE — Telephone Encounter (Signed)
Opened in error

## 2015-01-03 NOTE — Telephone Encounter (Signed)
Patient's daughter called requesting refill for oxycodone solution.  Please call [phone] when prescription is ready for pick up.  Needs today.

## 2015-01-04 NOTE — Telephone Encounter (Signed)
"  I called yesterday for a prescription and no one called me to say it was ready." Advised script is ready for pick up with instructions to come to sign for prescription.

## 2015-01-17 ENCOUNTER — Telehealth: Payer: Self-pay | Admitting: *Deleted

## 2015-01-17 ENCOUNTER — Telehealth: Payer: Self-pay | Admitting: Hematology

## 2015-01-17 DIAGNOSIS — C3490 Malignant neoplasm of unspecified part of unspecified bronchus or lung: Secondary | ICD-10-CM

## 2015-01-17 NOTE — Telephone Encounter (Signed)
POF sent to scheduler for chemotherapy treatment

## 2015-01-17 NOTE — Telephone Encounter (Signed)
Spoke with dtr re next appointments for 12/7, 12/8 and 12/9 - dtr will get new schedule tomorrow. Appointments added per 12/6 pof. Spoke with desk nurse re patient currently having no f/u on schedule and no f/u instructions. Per desk nurse will discuss with [place] when she returns and send pof for next f/u.

## 2015-01-17 NOTE — Telephone Encounter (Signed)
Spoke with daughter Langley Gauss and was informed that she received call from office about pt's appts for 12/7, 12/8, and 12/9.

## 2015-01-17 NOTE — Telephone Encounter (Signed)
The patient's daughter called regarding appts for this week. No appts scheduled or POF to schedule from. Message routed to desk RN and triage.

## 2015-01-18 ENCOUNTER — Telehealth: Payer: Self-pay | Admitting: *Deleted

## 2015-01-18 ENCOUNTER — Ambulatory Visit (HOSPITAL_COMMUNITY)
Admission: RE | Admit: 2015-01-18 | Discharge: 2015-01-18 | Disposition: A | Discharge status: Home / Self Care | Payer: Medicare Other | Source: Ambulatory Visit | Attending: Radiation Oncology | Admitting: Radiation Oncology

## 2015-01-18 ENCOUNTER — Telehealth: Payer: Self-pay | Admitting: Hematology

## 2015-01-18 ENCOUNTER — Other Ambulatory Visit (HOSPITAL_BASED_OUTPATIENT_CLINIC_OR_DEPARTMENT_OTHER): Payer: Medicare Other

## 2015-01-18 ENCOUNTER — Other Ambulatory Visit: Payer: Self-pay | Admitting: *Deleted

## 2015-01-18 ENCOUNTER — Ambulatory Visit: Payer: Medicare Other

## 2015-01-18 ENCOUNTER — Ambulatory Visit (HOSPITAL_BASED_OUTPATIENT_CLINIC_OR_DEPARTMENT_OTHER): Payer: Medicare Other

## 2015-01-18 VITALS — BP 138/70 | HR 102 | Temp 98.4°F | Resp 20

## 2015-01-18 DIAGNOSIS — C3491 Malignant neoplasm of unspecified part of right bronchus or lung: Secondary | ICD-10-CM

## 2015-01-18 DIAGNOSIS — T451X5A Adverse effect of antineoplastic and immunosuppressive drugs, initial encounter: Secondary | ICD-10-CM | POA: Diagnosis not present

## 2015-01-18 DIAGNOSIS — E039 Hypothyroidism, unspecified: Secondary | ICD-10-CM

## 2015-01-18 DIAGNOSIS — D6481 Anemia due to antineoplastic chemotherapy: Secondary | ICD-10-CM | POA: Diagnosis present

## 2015-01-18 DIAGNOSIS — E46 Unspecified protein-calorie malnutrition: Secondary | ICD-10-CM | POA: Diagnosis not present

## 2015-01-18 DIAGNOSIS — C342 Malignant neoplasm of middle lobe, bronchus or lung: Secondary | ICD-10-CM

## 2015-01-18 DIAGNOSIS — C3490 Malignant neoplasm of unspecified part of unspecified bronchus or lung: Secondary | ICD-10-CM | POA: Diagnosis present

## 2015-01-18 DIAGNOSIS — D61810 Antineoplastic chemotherapy induced pancytopenia: Secondary | ICD-10-CM

## 2015-01-18 DIAGNOSIS — Z95828 Presence of other vascular implants and grafts: Secondary | ICD-10-CM

## 2015-01-18 DIAGNOSIS — D649 Anemia, unspecified: Secondary | ICD-10-CM

## 2015-01-18 LAB — COMPREHENSIVE METABOLIC PANEL
ALT: 9 U/L (ref 0–55)
AST: 10 U/L (ref 5–34)
Albumin: 2.9 g/dL — ABNORMAL LOW (ref 3.5–5.0)
Alkaline Phosphatase: 71 U/L (ref 40–150)
Anion Gap: 10 mEq/L (ref 3–11)
BUN: 19.3 mg/dL (ref 7.0–26.0)
CO2: 22 mEq/L (ref 22–29)
Calcium: 8.8 mg/dL (ref 8.4–10.4)
Chloride: 107 mEq/L (ref 98–109)
Creatinine: 1.2 mg/dL — ABNORMAL HIGH (ref 0.6–1.1)
EGFR: 46 mL/min/{1.73_m2} — ABNORMAL LOW (ref >90)
Glucose: 136 mg/dl (ref 70–140)
Potassium: 4.5 mEq/L (ref 3.5–5.1)
Sodium: 139 mEq/L (ref 136–145)
Total Bilirubin: <0.3 mg/dL (ref 0.20–1.20)
Total Protein: 6.3 g/dL — ABNORMAL LOW (ref 6.4–8.3)

## 2015-01-18 LAB — RETICULOCYTES (CHCC)
IMMATURE RETIC FRACT: 34.1 % — AB (ref 1.60–10.00)
RBC: 2.03 10*6/uL — ABNORMAL LOW (ref 3.70–5.45)
RETIC CT ABS: 115.51 10*3/uL — AB (ref 33.70–90.70)
Retic %: 5.69 % — ABNORMAL HIGH (ref 0.70–2.10)

## 2015-01-18 LAB — CBC WITH DIFFERENTIAL/PLATELET
BASO%: 0.5 % (ref 0.0–2.0)
Basophils Absolute: 0 10*3/uL (ref 0.0–0.1)
EOS ABS: 0.1 10*3/uL (ref 0.0–0.5)
EOS%: 1.2 % (ref 0.0–7.0)
HEMATOCRIT: 18.6 % — AB (ref 34.8–46.6)
LYMPH#: 0.8 10*3/uL — AB (ref 0.9–3.3)
LYMPH%: 19.7 % (ref 14.0–49.7)
MCH: 30 pg (ref 25.1–34.0)
MCHC: 32.8 g/dL (ref 31.5–36.0)
MCV: 91.6 fL (ref 79.5–101.0)
MONO#: 1 10*3/uL — AB (ref 0.1–0.9)
MONO%: 23.7 % — ABNORMAL HIGH (ref 0.0–14.0)
NEUT%: 54.9 % (ref 38.4–76.8)
NEUTROS ABS: 2.4 10*3/uL (ref 1.5–6.5)
PLATELETS: 160 10*3/uL (ref 145–400)
RBC: 2.03 10*6/uL — ABNORMAL LOW (ref 3.70–5.45)
RDW: 17.8 % — ABNORMAL HIGH (ref 11.2–14.5)
WBC: 4.3 10*3/uL (ref 3.9–10.3)
nRBC: 1 % — ABNORMAL HIGH (ref 0–0)

## 2015-01-18 LAB — MAGNESIUM: MAGNESIUM: 1.8 mg/dL (ref 1.5–2.5)

## 2015-01-18 LAB — TECHNOLOGIST REVIEW

## 2015-01-18 LAB — LACTATE DEHYDROGENASE: LDH: 176 U/L (ref 125–245)

## 2015-01-18 LAB — PREPARE RBC (CROSSMATCH)

## 2015-01-18 LAB — HOLD TUBE, BLOOD BANK

## 2015-01-18 MED ORDER — SODIUM CHLORIDE 0.9 % IJ SOLN
10.0000 mL | INTRAMUSCULAR | Status: DC | PRN
  Administered 2015-01-18: 10 mL via INTRAVENOUS
  Filled 2015-01-18: qty 10

## 2015-01-18 MED ORDER — ACETAMINOPHEN 325 MG PO TABS
650.0000 mg | ORAL_TABLET | Freq: Once | ORAL | Status: AC
Start: 2015-01-18 — End: 2015-01-18
  Administered 2015-01-18: 650 mg via ORAL

## 2015-01-18 MED ORDER — ACETAMINOPHEN 325 MG PO TABS
ORAL_TABLET | ORAL | Status: AC
  Filled 2015-01-18: qty 2

## 2015-01-18 MED ORDER — HEPARIN SOD (PORK) LOCK FLUSH 100 UNIT/ML IV SOLN
500.0000 [IU] | Freq: Once | INTRAVENOUS | Status: AC
  Administered 2015-01-18: 500 [IU] via INTRAVENOUS
  Filled 2015-01-18: qty 5

## 2015-01-18 MED ORDER — SODIUM CHLORIDE 0.9 % IJ SOLN
10.0000 mL | INTRAMUSCULAR | Status: AC | PRN
  Administered 2015-01-18: 10 mL via INTRAVENOUS
  Filled 2015-01-18: qty 10

## 2015-01-18 MED ORDER — DIPHENHYDRAMINE HCL 25 MG PO CAPS
25.0000 mg | ORAL_CAPSULE | Freq: Once | ORAL | Status: AC
  Administered 2015-01-18: 25 mg via ORAL

## 2015-01-18 MED ORDER — SODIUM CHLORIDE 0.9 % IV SOLN: Freq: Once | INTRAVENOUS | Status: DC

## 2015-01-18 MED ORDER — DIPHENHYDRAMINE HCL 25 MG PO CAPS
ORAL_CAPSULE | ORAL | Status: AC
  Filled 2015-01-18: qty 1

## 2015-01-18 NOTE — Telephone Encounter (Signed)
Per staff message and POF I have scheduled appts. Advised scheduler of appts. JMW  

## 2015-01-18 NOTE — Patient Instructions (Signed)

## 2015-01-18 NOTE — Progress Notes (Signed)
Rachel Chapman  HEMATOLOGY ONCOLOGY PROGRESS NOTE  Date of service: .12/28/2014   Patient Care Team: [name], NP as PCP - General (Family Medicine)  Diagnosis: Limited stage small cell lung cancer  Current Treatment:   Status post 3 cycles of carboplatin and etoposide Status post Concurrent radiation therapy completed 12/16/2014.  INTERVAL HISTORY:  Patient is here for follow-up in clinic for her interval follow-up prior to her delayed 4th cycle of chemotherapy. She notes that she has been able to move her bowels better after her manual disimpaction. Improving oral intake. Discomfort with swallowing much improved. No other acute new symptoms. No fevers or chills. Feels ready to get her fourth cycle of chemotherapy.  REVIEW OF SYSTEMS:    10 Point review of systems of done and is negative except as noted above.  . Past Medical History  Diagnosis Date  . Hypertension   . Dyslipidemia   . Diabetes ([facility])   . Hypothyroidism   . Pneumonia 2007  . Smoker     1.5 packs per day for 60 years.  Quit one month ago  . Anxiety   . Cancer ([place] [place])   . Lung cancer ([place]) 8/216    hilar/mediastinum lung scca    . Past Surgical History  Procedure Laterality Date  . Cesarean section      . Social History  Substance Use Topics  . Smoking status: Former Smoker -- 1.50 packs/day for 60 years    Quit date: 09/12/2014  . Smokeless tobacco: None  . Alcohol Use: No    ALLERGIES:  has No Known Allergies.  MEDICATIONS:  Current Outpatient Prescriptions  Medication Sig Dispense Refill  . albuterol (PROVENTIL HFA;VENTOLIN HFA) 108 (90 BASE) MCG/ACT inhaler Inhale 2 puffs into the lungs every 6 (six) hours as needed for shortness of breath. 3.7 g 3  . Alum & Mag Hydroxide-Simeth (GI COCKTAIL) SUSP suspension Take 30 mLs by mouth 3 (three) times daily as needed for indigestion. Each 30 mL contains 12 mL Maalox, 12 mL viscous lidocaine, 6 mL donnatal 500 mL 1  . cholecalciferol (VITAMIN D) 1000  UNITS tablet Take 1 tablet (1,000 Units total) by mouth daily. 90 tablet 3  . citalopram (CELEXA) 10 MG tablet Take 1 tablet (10 mg total) by mouth daily. 30 tablet 3  . dibucaine (NUPERCAINAL) 1 % ointment Apply topically 3 (three) times daily as needed for pain (anal pain from hemorrhoids/fissure). 30 g 0  . emollient (BIAFINE) cream Apply topically as needed. 454 g 0  . glucose blood (ONETOUCH VERIO) test strip Test once daily. (Patient taking differently: 1 each by Other route. One to two times daily,) 100 each 5  . levothyroxine (SYNTHROID, LEVOTHROID) 112 MCG tablet Take 1 tablet (112 mcg total) by mouth 2 (two) times daily. 90 tablet 3  . lidocaine (XYLOCAINE) 2 % solution Use as directed 15 mLs in the mouth or throat every 3 (three) hours as needed for mouth pain. Gargle and spit every three hours as needed. 100 mL 0  . lidocaine-prilocaine (EMLA) cream Apply topically once. 30 each 2  . lisinopril (PRINIVIL,ZESTRIL) 10 MG tablet Take 1 tablet (10 mg total) by mouth daily. 90 tablet 1  . LORazepam (ATIVAN) 0.5 MG tablet Take 1 tablet (0.5 mg total) by mouth every 8 (eight) hours as needed for anxiety (or nausea). Could take 2 tab 30-45 mins prior to PET/CT and MRI Brain 60 tablet 0  . magic mouthwash w/lidocaine SOLN Take 5 mLs by mouth 4 (four) times  daily as needed for mouth pain. 200 mL 0  . mirtazapine (REMERON) 15 MG tablet Take 0.5 tablets (7.5 mg total) by mouth at bedtime. 30 tablet 0  . ondansetron (ZOFRAN-ODT) 4 MG disintegrating tablet Take 4 mg by mouth every 8 (eight) hours as needed for nausea or vomiting.    Glory Rosebush DELICA LANCETS FINE MISC Test once daily. 100 each 5  . senna-docusate (SENNA S) 8.6-50 MG tablet Take 2 tablets by mouth at bedtime as needed for mild constipation. 60 tablet 1  . simvastatin (ZOCOR) 20 MG tablet Take 1 tablet (20 mg total) by mouth at bedtime. 90 tablet 3  . sitaGLIPtin (JANUVIA) 100 MG tablet Take 1 tablet (100 mg total) by mouth daily. 30  tablet 3  . sucralfate (CARAFATE) 1 G tablet Dissolve 1 tablet in 10 mL H20 and swallow 5 min before meals 60 tablet 5  . sucralfate (CARAFATE) 1 G tablet Take 1 tablet (1 g total) by mouth 4 (four) times daily -  with meals and at bedtime. 90 tablet 1  . vitamin B-12 (CYANOCOBALAMIN) 1000 MCG tablet Take 1 tablet (1,000 mcg total) by mouth daily. 90 tablet 3  . oxyCODONE (ROXICODONE) 5 MG/5ML solution Take 5-10 mLs (5-10 mg total) by mouth every 4 (four) hours as needed for moderate pain, severe pain or breakthrough pain. 473 mL 0  . polyethylene glycol (MIRALAX) packet Take 17 g by mouth daily. 30 each 1   No current facility-administered medications for this visit.   Facility-Administered Medications Ordered in Other Visits  Medication Dose Route Frequency Provider Last Rate Last Dose  . 0.9 %  sodium chloride infusion   Intravenous Once [name], MD      . sodium chloride 0.9 % injection 10 mL  10 mL Intracatheter PRN [name], MD   10 mL at 11/11/14 1131    PHYSICAL EXAMINATION: ECOG PERFORMANCE STATUS: 2 - Symptomatic, <50% confined to bed  . Filed Vitals:   12/28/14 1245 12/28/14 1247  BP: 93/61 107/58  Pulse: 92   Temp: 98.2 F (36.8 C)   Resp: 18     Filed Weights   12/28/14 1247  Weight: 136 lb 8 oz (61.916 kg)   .Body mass index is 24.96 kg/(m^2).  GENERAL:alert, in no acute distress and comfortable SKIN: skin color, texture, turgor are normal, no rashes or significant lesions EYES: normal, conjunctiva are pink and non-injected, sclera clear OROPHARYNX:no exudate, no erythema and lips, buccal mucosa, and tongue normal  NECK: supple, no JVD, thyroid normal size, non-tender, without nodularity LYMPH:  no palpable lymphadenopathy in the cervical, axillary or inguinal LUNGS: clear to auscultation with normal respiratory effort HEART: regular rate & rhythm,  no murmurs and no lower extremity edema ABDOMEN: abdomen soft, non-tender, normoactive bowel  sounds  Musculoskeletal: no cyanosis of digits and no clubbing  PSYCH: alert & oriented x 3 with fluent speech NEURO: no focal motor/sensory deficits  LABORATORY DATA:   I have reviewed the data as listed  . CBC Latest Ref Rng  12/28/2014 12/21/2014  WBC 3.9 - 10.3 10e3/uL  5.9 11.6(H)  Hemoglobin 11.6 - 15.9 g/dL  10.0(L) 10.6(L)  Hematocrit 34.8 - 46.6 %  29.8(L) 30.7(L)  Platelets 145 - 400 10e3/uL  205 128(L)    . CMP Latest Ref Rng  12/28/2014 12/21/2014  Glucose 70 - 140 mg/dl  137 172(H)  BUN 7.0 - 26.0 mg/dL  15.2 16.4  Creatinine 0.6 - 1.1 mg/dL  1.0 1.1  Sodium 136 - 145 mEq/L  138 136  Potassium 3.5 - 5.1 mEq/L  3.8 3.6  Chloride 101 - 111 mmol/L  - -  CO2 22 - 29 mEq/L  27 28  Calcium 8.4 - 10.4 mg/dL  8.9 9.4  Total Protein 6.4 - 8.3 g/dL  6.2(L) 6.4  Total Bilirubin 0.20 - 1.20 mg/dL  0.40 0.46  Alkaline Phos 40 - 150 U/L  51 58  AST 5 - 34 U/L  14 9  ALT 0 - 55 U/L  10 <9     RADIOGRAPHIC STUDIES: I have personally reviewed the radiological images as listed and agreed with the findings in the report. No results found.  ASSESSMENT & PLAN:   74 year old female with history of heavy smoking with  #1 Limited stage Small cell lung cancer. CT scan on 09/26/2014 at the outside hospital showed mediastinal adenopathy which was bronchoscopically biopsied and noted to be consistent with small cell lung cancer. No overt focal neurological deficits or symptoms suggestive of brain metastases at this time. PET/CT showed bulky hypermetabolic mediastinal disease consistent with limited stage small cell lung cancer. MRI brain with no overt CNS mets patient is currently status post 3 cycles of carboplatin etoposide and has completed concurrent IMRT on 02/14/2014. Patient is here for her fourth cycle of chemotherapy which was delivered by a week due to her fecal impaction issues. #2 normocytic normochromic anemia related to chemotherapy.improved today status post PRBC  transfusions. #3 alopecia related to chemotherapy #4 radiation esophagitis-significantly improved. Pain control with oxycodone and Magic mouthwash. Much improved appetite #5 pancytopenia related to chemoradiation-much improved counts today. #6 status post manual fecal disimpaction about a week ago -moving bowels well. Much improved rectal pain. #7 protein calorie malnutrition and weight loss due to small cell lung cancer.Improving #8 ex-smoker 90-pack-year history of smoking at one month ago. Continues to stay off her cigarettes. #9 COPD -- has never been diagnosed as such. He has been recently given an albuterol inhaler as needed. Not oxygen dependent. #10 hyperkalemia - resolved on rpt labs #11 hypothyroidism -continue with levothyroxine replacement #12 Diabetes patient has been controlled with oral hypoglycemics. She has lost about 15-20 pounds. #13 severe anxiety.Patient appears much improved on Celexa and when necessary lorazepam. Plan -patient's labs are stable today and she feels better is agreeable to proceeding with her fourth cycle of carboplatin and etoposide chemotherapy. -much improved radiation esophagitis continue Magic mouthwash, when necessary oxycodone and sucralfate. -Encourage by mouth intake and adequate fluids -Continue laxatives -We will plan to do 5-6 cycles of carboplatin and etoposide if tolerated. -After that the patient will need to have a discussion with radiation oncology to determine if she will pursue prophylactic cranial radiation. -Patient will need repeat PET/CT scan and MRI of the brain after completion of chemotherapy.  I spent 15 minutes counseling the patient face to face. The total time spent in the appointment was 20 minutes and more than 50% was on counseling and direct patient cares.    Sullivan Lone MD [place] AAHIVMS [facility] Hematology/Oncology Physician [facility]  (Office):       [phone] (Work cell):  [phone] (Fax):            [phone]

## 2015-01-18 NOTE — Patient Instructions (Signed)
Blood Transfusion, Care After  These instructions give you information about caring for yourself after your procedure. Your doctor may also give you more specific instructions. Call your doctor if you have any problems or questions after your procedure.   HOME CARE   Take medicines only as told by your doctor. Ask your doctor if you can take an over-the-counter pain reliever if you have a fever or headache a day or two after your procedure.   Return to your normal activities as told by your doctor.  GET HELP IF:    You develop redness or irritation at your IV site.   You have a fever, chills, or a headache that does not go away.   Your pee (urine) is darker than normal.   Your urine turns:    Pink.    Red.    Brown.   The white part of your eye turns yellow (jaundice).   You feel weak after doing your normal activities.  GET HELP RIGHT AWAY IF:    You have trouble breathing.   You have fever and chills and you also have:    Anxiety.    Chest or back pain.    Flushed or pink skin.    Clammy or sweaty skin.    A fast heartbeat.    A sick feeling in your stomach (nausea).     This information is not intended to replace advice given to you by your health care provider. Make sure you discuss any questions you have with your health care provider.     Document Released: 02/18/2014 Document Reviewed: 02/18/2014  Elsevier Interactive Patient Education 2016 [employer]

## 2015-01-18 NOTE — Telephone Encounter (Signed)
per pof to schpt appt-sent MW emilt o sch pt trmt-willc all pt once reply

## 2015-01-18 NOTE — Progress Notes (Unsigned)
Pt came in to treatment today, will get blood instead due to HGB 6.1. Pt's daughter came to [employer] requesting a different nurse. Stated "if she doesn't get another nurse I will unplug her and take her home." Daughter was asked by RN to step into hallway to use her cell phone and became upset. Informed daughter that infusion policy is no cell phone use. She stated she saw the signs. Pt relocated, assigned to a different nurse. Dr. Irene Limbo informed of above.  Pt's daughter later caused a scene after seeing a patient use his cell phone in the infusion room. Thanked her, informed her we will take care of it. She was not satisfied with this and requested to speak to a supervisor. Request acommodated. Daughter misstated several things while talking to infusion supervisor, was then taken into hallway to voice her concerns.  Throughout this time, pt was cared for and voiced no concerns.

## 2015-01-19 ENCOUNTER — Ambulatory Visit: Payer: Medicare Other

## 2015-01-19 LAB — TYPE AND SCREEN
ABO/RH(D): B POS
ANTIBODY SCREEN: NEGATIVE
UNIT DIVISION: 0
Unit division: 0

## 2015-01-20 ENCOUNTER — Ambulatory Visit: Payer: Medicare Other

## 2015-01-24 ENCOUNTER — Other Ambulatory Visit: Payer: Self-pay | Admitting: *Deleted

## 2015-01-24 ENCOUNTER — Telehealth: Payer: Self-pay | Admitting: Hematology

## 2015-01-24 NOTE — Telephone Encounter (Signed)
Appointments made and aware of added lab

## 2015-01-25 ENCOUNTER — Other Ambulatory Visit (HOSPITAL_BASED_OUTPATIENT_CLINIC_OR_DEPARTMENT_OTHER): Payer: Medicare Other

## 2015-01-25 ENCOUNTER — Ambulatory Visit (HOSPITAL_BASED_OUTPATIENT_CLINIC_OR_DEPARTMENT_OTHER): Payer: Medicare Other

## 2015-01-25 DIAGNOSIS — C3490 Malignant neoplasm of unspecified part of unspecified bronchus or lung: Secondary | ICD-10-CM

## 2015-01-25 DIAGNOSIS — C3491 Malignant neoplasm of unspecified part of right bronchus or lung: Secondary | ICD-10-CM

## 2015-01-25 DIAGNOSIS — Z5111 Encounter for antineoplastic chemotherapy: Secondary | ICD-10-CM

## 2015-01-25 LAB — COMPREHENSIVE METABOLIC PANEL
ALBUMIN: 3 g/dL — AB (ref 3.5–5.0)
ALK PHOS: 66 U/L (ref 40–150)
ALT: 19 U/L (ref 0–55)
ANION GAP: 9 meq/L (ref 3–11)
AST: 17 U/L (ref 5–34)
BILIRUBIN TOTAL: 0.31 mg/dL (ref 0.20–1.20)
BUN: 20.9 mg/dL (ref 7.0–26.0)
CALCIUM: 8.8 mg/dL (ref 8.4–10.4)
CO2: 24 mEq/L (ref 22–29)
Chloride: 107 mEq/L (ref 98–109)
Creatinine: 1.2 mg/dL — ABNORMAL HIGH (ref 0.6–1.1)
EGFR: 43 mL/min/{1.73_m2} — AB (ref >90)
Glucose: 146 mg/dl — ABNORMAL HIGH (ref 70–140)
POTASSIUM: 4.7 meq/L (ref 3.5–5.1)
SODIUM: 140 meq/L (ref 136–145)
TOTAL PROTEIN: 6.5 g/dL (ref 6.4–8.3)

## 2015-01-25 LAB — CBC & DIFF AND RETIC
BASO%: 0.2 % (ref 0.0–2.0)
Basophils Absolute: 0 10*3/uL (ref 0.0–0.1)
EOS ABS: 0 10*3/uL (ref 0.0–0.5)
EOS%: 0.2 % (ref 0.0–7.0)
HCT: 29.7 % — ABNORMAL LOW (ref 34.8–46.6)
HEMOGLOBIN: 9.6 g/dL — AB (ref 11.6–15.9)
IMMATURE RETIC FRACT: 19 % — AB (ref 1.60–10.00)
LYMPH%: 15.4 % (ref 14.0–49.7)
MCH: 29.7 pg (ref 25.1–34.0)
MCHC: 32.3 g/dL (ref 31.5–36.0)
MCV: 92 fL (ref 79.5–101.0)
MONO#: 1.2 10*3/uL — AB (ref 0.1–0.9)
MONO%: 21.3 % — AB (ref 0.0–14.0)
NEUT%: 62.9 % (ref 38.4–76.8)
NEUTROS ABS: 3.6 10*3/uL (ref 1.5–6.5)
PLATELETS: 201 10*3/uL (ref 145–400)
RBC: 3.23 10*6/uL — ABNORMAL LOW (ref 3.70–5.45)
RDW: 18 % — AB (ref 11.2–14.5)
Retic %: 4.17 % — ABNORMAL HIGH (ref 0.70–2.10)
Retic Ct Abs: 134.69 10*3/uL — ABNORMAL HIGH (ref 33.70–90.70)
WBC: 5.7 10*3/uL (ref 3.9–10.3)
lymph#: 0.9 10*3/uL (ref 0.9–3.3)

## 2015-01-25 MED ORDER — HEPARIN SOD (PORK) LOCK FLUSH 100 UNIT/ML IV SOLN
500.0000 [IU] | Freq: Once | INTRAVENOUS | Status: AC | PRN
  Administered 2015-01-25: 500 [IU]
  Filled 2015-01-25: qty 5

## 2015-01-25 MED ORDER — SODIUM CHLORIDE 0.9 % IV SOLN
326.0000 mg | Freq: Once | INTRAVENOUS | Status: AC
  Administered 2015-01-25: 330 mg via INTRAVENOUS
  Filled 2015-01-25: qty 33

## 2015-01-25 MED ORDER — ETOPOSIDE CHEMO INJECTION 1 GM/50ML
100.0000 mg/m2 | Freq: Once | INTRAVENOUS | Status: AC
  Administered 2015-01-25: 170 mg via INTRAVENOUS
  Filled 2015-01-25: qty 8.5

## 2015-01-25 MED ORDER — SODIUM CHLORIDE 0.9 % IV SOLN
INTRAVENOUS | Status: AC
  Administered 2015-01-25: 15:00:00 via INTRAVENOUS

## 2015-01-25 MED ORDER — SODIUM CHLORIDE 0.9 % IV SOLN
Freq: Once | INTRAVENOUS | Status: AC
  Administered 2015-01-25: 15:00:00 via INTRAVENOUS
  Filled 2015-01-25: qty 8

## 2015-01-25 MED ORDER — SODIUM CHLORIDE 0.9 % IJ SOLN
10.0000 mL | INTRAMUSCULAR | Status: DC | PRN
  Administered 2015-01-25: 10 mL
  Filled 2015-01-25: qty 10

## 2015-01-25 MED ORDER — SODIUM CHLORIDE 0.9 % IV SOLN
Freq: Once | INTRAVENOUS | Status: AC
  Administered 2015-01-25: 15:00:00 via INTRAVENOUS

## 2015-01-25 NOTE — Patient Instructions (Addendum)
[facility] Discharge Instructions for Patients Receiving Chemotherapy  Today you received the following chemotherapy agents Etoposide/Carboplatin  To help prevent nausea and vomiting after your treatment, we encourage you to take your nausea medication     If you develop nausea and vomiting that is not controlled by your nausea medication, call the clinic.   BELOW ARE SYMPTOMS THAT SHOULD BE REPORTED IMMEDIATELY:  *FEVER GREATER THAN 100.5 F  *CHILLS WITH OR WITHOUT FEVER  NAUSEA AND VOMITING THAT IS NOT CONTROLLED WITH YOUR NAUSEA MEDICATION  *UNUSUAL SHORTNESS OF BREATH  *UNUSUAL BRUISING OR BLEEDING  TENDERNESS IN MOUTH AND THROAT WITH OR WITHOUT PRESENCE OF ULCERS  *URINARY PROBLEMS  *BOWEL PROBLEMS  UNUSUAL RASH Items with * indicate a potential emergency and should be followed up as soon as possible.  Feel free to call the clinic you have any questions or concerns. The clinic phone number is [phone].  Please show the [facility] at check-in to the Emergency Department and triage nurse.   Dehydration, Adult Dehydration means your body does not have as much fluid or water as it needs. It happens when you take in less fluid than you lose. Your kidneys, brain, and heart will not work properly without the right amount of fluids.  Dehydration can range from mild to severe. It should be treated right away to help prevent it from becoming severe. HOME CARE  Drink enough fluid to keep your pee (urine) clear or pale yellow.  Drink water or fluid slowly by taking small sips. You can also try sucking on ice cubes.  Have food or drinks that contain electrolytes. Examples include bananas and sports drinks.  Take over-the-counter and prescription medicines only as told by your doctor.  Prepare oral rehydration solution (ORS) according to the instructions that came with it. Take sips of ORS every 5 minutes until your pee returns to normal.  If you  are throwing up (vomiting) or have watery poop (diarrhea), keep trying to drink water, ORS, or both.  If you have watery poop, avoid:  Drinks with caffeine.  Fruit juice.  Milk.  Carbonated soft drinks.  Do not take salt tablets. This can lead to having too much sodium in your body (hypernatremia). GET HELP IF:  You cannot eat or drink without throwing up.  You have had mild watery poop for longer than 24 hours.  You have a fever. GET HELP RIGHT AWAY IF:   You have very strong thirst.  You have very bad watery poop.  You have not peed in 6-8 hours, or you have peed only a small amount of very dark pee.  You have shriveled skin.  You are dizzy, confused, or both.   This information is not intended to replace advice given to you by your health care provider. Make sure you discuss any questions you have with your health care provider.   Document Released: 11/24/2008 Document Revised: 10/19/2014 Document Reviewed: 06/15/2014 Elsevier Interactive Patient Education [employer].

## 2015-01-26 ENCOUNTER — Ambulatory Visit: Payer: Medicare Other

## 2015-01-26 ENCOUNTER — Telehealth: Payer: Self-pay | Admitting: *Deleted

## 2015-01-26 NOTE — Telephone Encounter (Signed)
TC from pt's dau-in-law, Langley Gauss requesting to change pt's chemo appt today from 2:30 pm to 12:30 pm. Checked with charge nurse and there are no appt availability for that time today. Denise then cancelled appt for today. Langley Gauss stated she had to pick up her children from scholl and could not drop her mother-in-law off for her treatment.  Dr. Grier Mitts nurse, [place] notified.

## 2015-01-27 ENCOUNTER — Ambulatory Visit (HOSPITAL_BASED_OUTPATIENT_CLINIC_OR_DEPARTMENT_OTHER): Payer: Medicare Other

## 2015-01-27 ENCOUNTER — Encounter: Payer: Self-pay | Admitting: Radiation Oncology

## 2015-01-27 ENCOUNTER — Ambulatory Visit
Admission: RE | Admit: 2015-01-27 | Discharge: 2015-01-27 | Disposition: A | Discharge status: Home / Self Care | Payer: Medicare Other | Source: Ambulatory Visit | Attending: Radiation Oncology | Admitting: Radiation Oncology

## 2015-01-27 ENCOUNTER — Encounter: Payer: Self-pay | Admitting: Hematology

## 2015-01-27 VITALS — BP 160/86 | HR 95 | Temp 98.2°F | Resp 20

## 2015-01-27 VITALS — BP 125/69 | HR 99 | Temp 97.5°F | Ht 62.0 in | Wt 143.5 lb

## 2015-01-27 DIAGNOSIS — Z5111 Encounter for antineoplastic chemotherapy: Secondary | ICD-10-CM

## 2015-01-27 DIAGNOSIS — C3490 Malignant neoplasm of unspecified part of unspecified bronchus or lung: Secondary | ICD-10-CM | POA: Diagnosis not present

## 2015-01-27 DIAGNOSIS — C342 Malignant neoplasm of middle lobe, bronchus or lung: Secondary | ICD-10-CM

## 2015-01-27 MED ORDER — SODIUM CHLORIDE 0.9 % IJ SOLN
10.0000 mL | INTRAMUSCULAR | Status: DC | PRN
  Administered 2015-01-27: 10 mL
  Filled 2015-01-27: qty 10

## 2015-01-27 MED ORDER — SODIUM CHLORIDE 0.9 % IV SOLN
Freq: Once | INTRAVENOUS | Status: AC
  Administered 2015-01-27: 13:00:00 via INTRAVENOUS

## 2015-01-27 MED ORDER — FLUCONAZOLE 100 MG PO TABS: ORAL_TABLET | ORAL | Status: DC

## 2015-01-27 MED ORDER — HEPARIN SOD (PORK) LOCK FLUSH 100 UNIT/ML IV SOLN
500.0000 [IU] | Freq: Once | INTRAVENOUS | Status: AC | PRN
  Administered 2015-01-27: 500 [IU]
  Filled 2015-01-27: qty 5

## 2015-01-27 MED ORDER — PROCHLORPERAZINE MALEATE 10 MG PO TABS
10.0000 mg | ORAL_TABLET | Freq: Once | ORAL | Status: AC
  Administered 2015-01-27: 10 mg via ORAL

## 2015-01-27 MED ORDER — SODIUM CHLORIDE 0.9 % IV SOLN
100.0000 mg/m2 | Freq: Once | INTRAVENOUS | Status: AC
  Administered 2015-01-27: 170 mg via INTRAVENOUS
  Filled 2015-01-27: qty 8.5

## 2015-01-27 MED ORDER — PROCHLORPERAZINE MALEATE 10 MG PO TABS
ORAL_TABLET | ORAL | Status: AC
  Filled 2015-01-27: qty 1

## 2015-01-27 NOTE — Progress Notes (Signed)
Rachel Chapman is here for follow up of radiation completed 12/16/2014 to her Right Lung. She reports pain when swallowing a 10/10. She is using the GI cocktail to help relieve this pain, but states it only works for about 5 minutes. She is not taking any pain medicine for this at this time. She reports her energy level is good, and states she feels as if she is breathing well. She states she is eating well, but not drinking as well as she should. She has no other complaints at this time.   BP 125/69 mmHg  Pulse 99  Temp(Src) 97.5 F (36.4 C)  Ht '5\' 2"'$  (1.575 m)  Wt 143 lb 8 oz (65.091 kg)  BMI 26.24 kg/m2  SpO2 100%   Wt Readings from Last 3 Encounters:  01/27/15 143 lb 8 oz (65.091 kg)  12/28/14 136 lb 8 oz (61.916 kg)  12/20/14 139 lb 14.4 oz (63.458 kg)

## 2015-01-27 NOTE — Patient Instructions (Signed)
[facility] Discharge Instructions for Patients Receiving Chemotherapy  Today you received the following chemotherapy agents Etoposide/Carboplatin  To help prevent nausea and vomiting after your treatment, we encourage you to take your nausea medication     If you develop nausea and vomiting that is not controlled by your nausea medication, call the clinic.   BELOW ARE SYMPTOMS THAT SHOULD BE REPORTED IMMEDIATELY:  *FEVER GREATER THAN 100.5 F  *CHILLS WITH OR WITHOUT FEVER  NAUSEA AND VOMITING THAT IS NOT CONTROLLED WITH YOUR NAUSEA MEDICATION  *UNUSUAL SHORTNESS OF BREATH  *UNUSUAL BRUISING OR BLEEDING  TENDERNESS IN MOUTH AND THROAT WITH OR WITHOUT PRESENCE OF ULCERS  *URINARY PROBLEMS  *BOWEL PROBLEMS  UNUSUAL RASH Items with * indicate a potential emergency and should be followed up as soon as possible.  Feel free to call the clinic you have any questions or concerns. The clinic phone number is [phone].  Please show the [facility] at check-in to the Emergency Department and triage nurse.   Dehydration, Adult Dehydration means your body does not have as much fluid or water as it needs. It happens when you take in less fluid than you lose. Your kidneys, brain, and heart will not work properly without the right amount of fluids.  Dehydration can range from mild to severe. It should be treated right away to help prevent it from becoming severe. HOME CARE  Drink enough fluid to keep your pee (urine) clear or pale yellow.  Drink water or fluid slowly by taking small sips. You can also try sucking on ice cubes.  Have food or drinks that contain electrolytes. Examples include bananas and sports drinks.  Take over-the-counter and prescription medicines only as told by your doctor.  Prepare oral rehydration solution (ORS) according to the instructions that came with it. Take sips of ORS every 5 minutes until your pee returns to normal.  If you  are throwing up (vomiting) or have watery poop (diarrhea), keep trying to drink water, ORS, or both.  If you have watery poop, avoid:  Drinks with caffeine.  Fruit juice.  Milk.  Carbonated soft drinks.  Do not take salt tablets. This can lead to having too much sodium in your body (hypernatremia). GET HELP IF:  You cannot eat or drink without throwing up.  You have had mild watery poop for longer than 24 hours.  You have a fever. GET HELP RIGHT AWAY IF:   You have very strong thirst.  You have very bad watery poop.  You have not peed in 6-8 hours, or you have peed only a small amount of very dark pee.  You have shriveled skin.  You are dizzy, confused, or both.   This information is not intended to replace advice given to you by your health care provider. Make sure you discuss any questions you have with your health care provider.   Document Released: 11/24/2008 Document Revised: 10/19/2014 Document Reviewed: 06/15/2014 Elsevier Interactive Patient Education [employer].

## 2015-01-27 NOTE — Progress Notes (Signed)
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 Cancer of middle lobe of lung (HCC) 162.4 C34.2 fluconazole (DIFLUCAN) 100 MG tablet   Radiation treatment dates:   11/07/2014-12/16/2014 Site/dose: Right Lung. 60 Gy in 30 fractions.  Narrative:  The patient returns today for routine follow-up of radiation completed 12/16/2014 to her Right Lung. She reports pain when swallowing a 10/10. She is using the GI cocktail to help relieve this pain, but states it only works for about 5 minutes. The pain is the same as after she received radiation without improval. She is not taking any pain medicine for this at this time. She reports her energy level is good, and states she feels as if she is breathing well. She states she is eating well, but not drinking as well as she should. She has no other complaints at this time. Denies white patches in her mouth.  States that she has quit smoking. Scheduled to see her PCP, Dr. Carlisle Cater on Monday, 12/19.                                ALLERGIES:  has No Known Allergies.  Meds: Current Outpatient Prescriptions  Medication Sig Dispense Refill  . albuterol (PROVENTIL HFA;VENTOLIN HFA) 108 (90 BASE) MCG/ACT inhaler Inhale 2 puffs into the lungs every 6 (six) hours as needed for shortness of breath. 3.7 g 3  . Alum & Mag Hydroxide-Simeth (GI COCKTAIL) SUSP suspension Take 30 mLs by mouth 3 (three) times daily as needed for indigestion. Each 30 mL contains 12 mL Maalox, 12 mL viscous lidocaine, 6 mL donnatal 500 mL 1  . cholecalciferol (VITAMIN D) 1000 UNITS tablet Take 1 tablet (1,000 Units total) by mouth daily. 90  tablet 3  . citalopram (CELEXA) 10 MG tablet Take 1 tablet (10 mg total) by mouth daily. 30 tablet 3  . dibucaine (NUPERCAINAL) 1 % ointment Apply topically 3 (three) times daily as needed for pain (anal pain from hemorrhoids/fissure). 30 g 0  . glucose blood (ONETOUCH VERIO) test strip Test once daily. (Patient taking differently: 1 each by Other route. One to two times daily,) 100 each 5  . levothyroxine (SYNTHROID, LEVOTHROID) 112 MCG tablet Take 1 tablet (112 mcg total) by mouth 2 (two) times daily. 90 tablet 3  . lidocaine (XYLOCAINE) 2 % solution Use as directed 15 mLs in the mouth or throat every 3 (three) hours as needed for mouth pain. Gargle and spit every three hours as needed. 100 mL 0  . lidocaine-prilocaine (EMLA) cream Apply topically once. 30 each 2  . lisinopril (PRINIVIL,ZESTRIL) 10 MG tablet Take 1 tablet (10 mg total) by mouth daily. 90 tablet 1  . LORazepam (ATIVAN) 0.5 MG tablet Take 1 tablet (0.5 mg total) by mouth every 8 (eight) hours as needed for anxiety (or nausea). Could take 2 tab 30-45 mins prior to PET/CT and MRI Brain 60 tablet 0  . magic mouthwash w/lidocaine SOLN Take 5 mLs by mouth 4 (four) times daily as needed for mouth pain. 200 mL 0  . mirtazapine (REMERON) 15 MG  tablet Take 0.5 tablets (7.5 mg total) by mouth at bedtime. 30 tablet 0  . ondansetron (ZOFRAN-ODT) 4 MG disintegrating tablet Take 4 mg by mouth every 8 (eight) hours as needed for nausea or vomiting.    Glory Rosebush DELICA LANCETS FINE MISC Test once daily. 100 each 5  . simvastatin (ZOCOR) 20 MG tablet Take 1 tablet (20 mg total) by mouth at bedtime. 90 tablet 3  . sitaGLIPtin (JANUVIA) 100 MG tablet Take 1 tablet (100 mg total) by mouth daily. 30 tablet 3  . sucralfate (CARAFATE) 1 G tablet Dissolve 1 tablet in 10 mL H20 and swallow 5 min before meals 60 tablet 5  . sucralfate (CARAFATE) 1 G tablet Take 1 tablet (1 g total) by mouth 4 (four) times daily -  with meals and at bedtime. 90 tablet 1  .  vitamin B-12 (CYANOCOBALAMIN) 1000 MCG tablet Take 1 tablet (1,000 mcg total) by mouth daily. 90 tablet 3  . fluconazole (DIFLUCAN) 100 MG tablet Take 2 tablets today, then 1 tablet daily x 6 more days. Hold simvastatin while on this drug. 8 tablet 0  . oxyCODONE (ROXICODONE) 5 MG/5ML solution Take 5-10 mLs (5-10 mg total) by mouth every 4 (four) hours as needed for moderate pain, severe pain or breakthrough pain. (Patient not taking: Reported on 01/27/2015) 473 mL 0  . polyethylene glycol (MIRALAX) packet Take 17 g by mouth daily. (Patient not taking: Reported on 01/27/2015) 30 each 1  . senna-docusate (SENNA S) 8.6-50 MG tablet Take 2 tablets by mouth at bedtime as needed for mild constipation. (Patient not taking: Reported on 01/27/2015) 60 tablet 1   No current facility-administered medications for this encounter.   Facility-Administered Medications Ordered in Other Encounters  Medication Dose Route Frequency Provider Last Rate Last Dose  . 0.9 %  sodium chloride infusion   Intravenous Once [name], MD      . etoposide (VEPESID) 170 mg in sodium chloride 0.9 % 500 mL chemo infusion  100 mg/m2 (Treatment Plan Actual) Intravenous Once [name], MD      . heparin lock flush 100 unit/mL  500 Units Intracatheter Once PRN [name], MD      . sodium chloride 0.9 % injection 10 mL  10 mL Intracatheter PRN [name], MD   10 mL at 11/11/14 1131  . sodium chloride 0.9 % injection 10 mL  10 mL Intravenous PRN [name], MD   10 mL at 01/18/15 1527  . sodium chloride 0.9 % injection 10 mL  10 mL Intracatheter PRN [name], MD        Physical Findings: The patient is in no acute distress. Patient is alert and oriented.  height is '5\' 2"'$  (1.575 m) and weight is 143 lb 8 oz (65.091 kg). Her temperature is 97.5 F (36.4 C). Her blood pressure is 125/69 and her pulse is 99. Her oxygen saturation is 100%. .    No oral thrush noted. No palpable masses  in her neck or cervical or supraclavicular regions of the neck.  Chest is clear to auscultation bilaterally.   Lab Findings: Lab Results  Component Value Date   WBC 5.7 01/25/2015   HGB 9.6* 01/25/2015   HCT 29.7* 01/25/2015   MCV 92.0 01/25/2015   PLT 201 01/25/2015    Radiographic Findings: No results found.  Impression/Plan: Delayed healing from esophagitis: in case of persistent fungal infection,  I have written the patient a prescription for fluconazole.  Her  restaging PET will take place sometime in January after her sixth round of chemotherapy treatment, however it has not yet been scheduled. MRI  of brain to be at that time, too.  Discussed PCI with patient and daughter today. We will re-discuss prophylactic brain radiation when we follow up after her next brain MRI and PET scan. I will ask Dr. Irene Limbo to refer the patient back to me to be within roughly a week of when this studies are conducted.    _____________________________________   [name], MD   This document serves as a record of services personally performed by [name], MD. It was created on her behalf by [name], a trained medical scribe. The creation of this record is based on the scribe's personal observations and the provider's statements to them. This document has been checked and approved by the attending provider.

## 2015-01-30 ENCOUNTER — Ambulatory Visit (INDEPENDENT_AMBULATORY_CARE_PROVIDER_SITE_OTHER): Payer: Medicare Other | Admitting: Adult Health

## 2015-01-30 ENCOUNTER — Encounter: Payer: Self-pay | Admitting: Adult Health

## 2015-01-30 VITALS — BP 110/80 | Temp 98.2°F | Ht 62.0 in | Wt 141.3 lb

## 2015-01-30 DIAGNOSIS — Z09 Encounter for follow-up examination after completed treatment for conditions other than malignant neoplasm: Secondary | ICD-10-CM | POA: Diagnosis not present

## 2015-01-30 NOTE — Progress Notes (Signed)
Pre visit review using our clinic review tool, if applicable. No additional management support is needed unless otherwise documented below in the visit note. 

## 2015-01-30 NOTE — Progress Notes (Signed)
   Subjective:    Patient ID: Rachel Chapman, female    DOB: 13-Feb-1940, 74 y.o.   MRN: [id number]  HPI  74 year old female who presents to the office today for two month follow up. She last saw her Oncologist Dr. [name] on 01/27/2015. She has finished radiation on 12/16/2014 to her right lung. She reports that her energy level is good. She is eating well and her hydration has picked up. She is having throat pain, which oncology wrote for a week of Diflucan. Has not noticed any difference yet.   She will have her sixth round of chemo in January and then will have a PET and MRI of brain done.   She denies any fevers, nausea, vomiting, diarrhea  Review of Systems  Constitutional: Negative.   HENT: Positive for sore throat and voice change. Negative for trouble swallowing.   Respiratory: Negative.   Cardiovascular: Negative.   Gastrointestinal: Negative.   Genitourinary: Negative.   Musculoskeletal: Negative.   Neurological: Negative.   Hematological: Negative.   Psychiatric/Behavioral: Negative.   All other systems reviewed and are negative.      Objective:   Physical Exam  Constitutional: She is oriented to person, place, and time. She appears well-developed and well-nourished. No distress.  HENT:  Mouth/Throat: Oropharynx is clear and moist. No oropharyngeal exudate.  No oral thrush noted. No sores in mouth  Neck: Normal range of motion. Neck supple.  Cardiovascular: Normal rate, regular rhythm, normal heart sounds and intact distal pulses.  Exam reveals no gallop and no friction rub.   No murmur heard. Pulmonary/Chest: Effort normal and breath sounds normal. No respiratory distress. She has no wheezes. She has no rales. She exhibits no tenderness.  Lymphadenopathy:    She has no cervical adenopathy.  Neurological: She is alert and oriented to person, place, and time.  Skin: Skin is warm and dry. No rash noted. She is not diaphoretic. No erythema. No pallor.    Psychiatric: She has a normal mood and affect. Her behavior is normal. Judgment and thought content normal.  Nursing note and vitals reviewed.     Assessment & Plan:  1. Follow up - She appears to be doing well. She has gained weight and is eating better. I still want her to work on increasing liquids. Labs reviewed that were taken 5 days ago. Continue to monitor blood sugars but I am not overtly concerned about her diabetes at this time.   Follow up after chemo and PET scan. Follow up sooner if needed.

## 2015-02-06 ENCOUNTER — Other Ambulatory Visit: Payer: Self-pay | Admitting: Adult Health

## 2015-02-08 ENCOUNTER — Other Ambulatory Visit: Payer: Medicare Other

## 2015-02-08 ENCOUNTER — Ambulatory Visit: Payer: Medicare Other

## 2015-02-09 ENCOUNTER — Ambulatory Visit: Payer: Medicare Other

## 2015-02-10 ENCOUNTER — Ambulatory Visit: Payer: Medicare Other

## 2015-02-15 ENCOUNTER — Other Ambulatory Visit (HOSPITAL_BASED_OUTPATIENT_CLINIC_OR_DEPARTMENT_OTHER): Payer: Medicare Other

## 2015-02-15 ENCOUNTER — Ambulatory Visit (HOSPITAL_BASED_OUTPATIENT_CLINIC_OR_DEPARTMENT_OTHER): Payer: Medicare Other | Admitting: Hematology

## 2015-02-15 ENCOUNTER — Telehealth: Payer: Self-pay | Admitting: Hematology

## 2015-02-15 ENCOUNTER — Other Ambulatory Visit: Payer: Self-pay | Admitting: *Deleted

## 2015-02-15 ENCOUNTER — Ambulatory Visit (HOSPITAL_BASED_OUTPATIENT_CLINIC_OR_DEPARTMENT_OTHER): Payer: Medicare Other

## 2015-02-15 ENCOUNTER — Encounter: Payer: Self-pay | Admitting: Hematology

## 2015-02-15 ENCOUNTER — Other Ambulatory Visit: Payer: Self-pay | Admitting: Hematology

## 2015-02-15 VITALS — BP 105/51 | HR 107 | Temp 97.4°F | Resp 16 | Wt 148.0 lb

## 2015-02-15 DIAGNOSIS — C3490 Malignant neoplasm of unspecified part of unspecified bronchus or lung: Secondary | ICD-10-CM

## 2015-02-15 DIAGNOSIS — E119 Type 2 diabetes mellitus without complications: Secondary | ICD-10-CM

## 2015-02-15 DIAGNOSIS — Z452 Encounter for adjustment and management of vascular access device: Secondary | ICD-10-CM

## 2015-02-15 DIAGNOSIS — D649 Anemia, unspecified: Secondary | ICD-10-CM

## 2015-02-15 DIAGNOSIS — E039 Hypothyroidism, unspecified: Secondary | ICD-10-CM

## 2015-02-15 DIAGNOSIS — Z95828 Presence of other vascular implants and grafts: Secondary | ICD-10-CM

## 2015-02-15 DIAGNOSIS — C781 Secondary malignant neoplasm of mediastinum: Secondary | ICD-10-CM | POA: Diagnosis not present

## 2015-02-15 DIAGNOSIS — D709 Neutropenia, unspecified: Secondary | ICD-10-CM | POA: Diagnosis not present

## 2015-02-15 LAB — COMPREHENSIVE METABOLIC PANEL
ALT: 35 U/L (ref 0–55)
AST: 24 U/L (ref 5–34)
Albumin: 3.2 g/dL — ABNORMAL LOW (ref 3.5–5.0)
Alkaline Phosphatase: 90 U/L (ref 40–150)
Anion Gap: 8 mEq/L (ref 3–11)
BILIRUBIN TOTAL: 0.33 mg/dL (ref 0.20–1.20)
BUN: 21.2 mg/dL (ref 7.0–26.0)
CO2: 25 meq/L (ref 22–29)
CREATININE: 1.3 mg/dL — AB (ref 0.6–1.1)
Calcium: 8.5 mg/dL (ref 8.4–10.4)
Chloride: 106 mEq/L (ref 98–109)
EGFR: 40 mL/min/{1.73_m2} — AB (ref >90)
GLUCOSE: 152 mg/dL — AB (ref 70–140)
Potassium: 4.4 mEq/L (ref 3.5–5.1)
SODIUM: 139 meq/L (ref 136–145)
TOTAL PROTEIN: 6.5 g/dL (ref 6.4–8.3)

## 2015-02-15 LAB — CBC & DIFF AND RETIC
BASO%: 0.4 % (ref 0.0–2.0)
Basophils Absolute: 0 10*3/uL (ref 0.0–0.1)
EOS ABS: 0.1 10*3/uL (ref 0.0–0.5)
EOS%: 3.5 % (ref 0.0–7.0)
HCT: 25.4 % — ABNORMAL LOW (ref 34.8–46.6)
HEMOGLOBIN: 8.3 g/dL — AB (ref 11.6–15.9)
Immature Retic Fract: 21.9 % — ABNORMAL HIGH (ref 1.60–10.00)
LYMPH%: 49.6 % (ref 14.0–49.7)
MCH: 30.7 pg (ref 25.1–34.0)
MCHC: 32.7 g/dL (ref 31.5–36.0)
MCV: 94.1 fL (ref 79.5–101.0)
MONO#: 0.5 10*3/uL (ref 0.1–0.9)
MONO%: 21.9 % — AB (ref 0.0–14.0)
NEUT%: 24.6 % — ABNORMAL LOW (ref 38.4–76.8)
NEUTROS ABS: 0.6 10*3/uL — AB (ref 1.5–6.5)
Platelets: 117 10*3/uL — ABNORMAL LOW (ref 145–400)
RBC: 2.7 10*6/uL — AB (ref 3.70–5.45)
RDW: 20.2 % — AB (ref 11.2–14.5)
RETIC %: 5.08 % — AB (ref 0.70–2.10)
Retic Ct Abs: 137.16 10*3/uL — ABNORMAL HIGH (ref 33.70–90.70)
WBC: 2.3 10*3/uL — AB (ref 3.9–10.3)
lymph#: 1.1 10*3/uL (ref 0.9–3.3)

## 2015-02-15 MED ORDER — SODIUM CHLORIDE 0.9 % IV SOLN
1000.0000 mL | Freq: Once | INTRAVENOUS | Status: AC
  Administered 2015-02-15: 1000 mL via INTRAVENOUS

## 2015-02-15 MED ORDER — HEPARIN SOD (PORK) LOCK FLUSH 100 UNIT/ML IV SOLN
500.0000 [IU] | Freq: Once | INTRAVENOUS | Status: DC
  Filled 2015-02-15: qty 5

## 2015-02-15 MED ORDER — SODIUM CHLORIDE 0.9 % IJ SOLN
10.0000 mL | INTRAMUSCULAR | Status: DC | PRN
  Administered 2015-02-15: 10 mL via INTRAVENOUS
  Filled 2015-02-15: qty 10

## 2015-02-15 NOTE — Patient Instructions (Signed)

## 2015-02-15 NOTE — Telephone Encounter (Signed)
Gv pt appt for 03/01/15.

## 2015-02-15 NOTE — Patient Instructions (Signed)

## 2015-02-16 ENCOUNTER — Ambulatory Visit: Payer: Medicare Other

## 2015-02-17 ENCOUNTER — Ambulatory Visit: Payer: Medicare Other

## 2015-02-24 ENCOUNTER — Telehealth: Payer: Self-pay | Admitting: Hematology

## 2015-02-24 ENCOUNTER — Other Ambulatory Visit: Payer: Self-pay | Admitting: *Deleted

## 2015-02-24 NOTE — Telephone Encounter (Signed)
Spoke with patients daughter and she is aware of the new appointments on 1/24

## 2015-02-28 NOTE — Progress Notes (Signed)
Rachel Chapman  HEMATOLOGY ONCOLOGY PROGRESS NOTE  Date of service: .02/15/2015  Patient Care Team: [name], NP as PCP - General (Family Medicine)  Diagnosis: Limited stage small cell lung cancer  Current Treatment:   Status post 5 cycles of carboplatin and etoposide Status post Concurrent radiation therapy completed 12/16/2014.  INTERVAL HISTORY:  Patient is here for her scheduled follow-up in clinic prior to her sixth and final planned cycle of chemotherapy. Her fifth cycle was delayed due to cytopenias. Patient notes increased aggressively increasing fatigue. Some decrease oral intake. Appears a little dehydrated today. Labs suggest diminishing bone marrow reserve since the patient is still neutropenic and her hemoglobin level also is at 8.3. I counseled the patient and her son in detail about the pros and cons of completing cycle 6 with delay his. Overall given her clinical status and risk for decompensation with infections and poor oral intake as well as the need for additional transfusions it did not appear that they will be much advantage of doing a needed cycle 6 and therefore a decision was made to discontinue this. We decided to schedule a repeat PET/CT scan and MRI of the brain after planned treatment. I messaged Dr. Isidore Moos was her radiation oncologist to meet with the patient after her restaging scans to determine appropriateness of prophylactic cranial radiation.   REVIEW OF SYSTEMS:    10 Point review of systems of done and is negative except as noted above.  . Past Medical History  Diagnosis Date  . Hypertension   . Dyslipidemia   . Diabetes ([place])   . Hypothyroidism   . Pneumonia 2007  . Smoker     1.5 packs per day for 60 years.  Quit one month ago  . Anxiety   . Cancer ([place])   . Lung cancer ([place]) 8/216    hilar/mediastinum lung scca    . Past Surgical History  Procedure Laterality Date  . Cesarean section      . Social History  Substance Use Topics  . Smoking  status: Former Smoker -- 1.50 packs/day for 60 years    Quit date: 09/12/2014  . Smokeless tobacco: None  . Alcohol Use: No    ALLERGIES:  has No Known Allergies.  MEDICATIONS:  Current Outpatient Prescriptions  Medication Sig Dispense Refill  . albuterol (PROVENTIL HFA;VENTOLIN HFA) 108 (90 BASE) MCG/ACT inhaler Inhale 2 puffs into the lungs every 6 (six) hours as needed for shortness of breath. 3.7 g 3  . Alum & Mag Hydroxide-Simeth (GI COCKTAIL) SUSP suspension Take 30 mLs by mouth 3 (three) times daily as needed for indigestion. Each 30 mL contains 12 mL Maalox, 12 mL viscous lidocaine, 6 mL donnatal 500 mL 1  . cholecalciferol (VITAMIN D) 1000 UNITS tablet Take 1 tablet (1,000 Units total) by mouth daily. 90 tablet 3  . citalopram (CELEXA) 10 MG tablet Take 1 tablet (10 mg total) by mouth daily. 30 tablet 3  . dibucaine (NUPERCAINAL) 1 % ointment Apply topically 3 (three) times daily as needed for pain (anal pain from hemorrhoids/fissure). 30 g 0  . fluconazole (DIFLUCAN) 100 MG tablet Take 2 tablets today, then 1 tablet daily x 6 more days. Hold simvastatin while on this drug. 8 tablet 0  . glucose blood (ONETOUCH VERIO) test strip Test once daily. (Patient taking differently: 1 each by Other route. One to two times daily,) 100 each 5  . levothyroxine (SYNTHROID, LEVOTHROID) 112 MCG tablet Take 1 tablet (112 mcg total) by mouth 2 (two)  times daily. 90 tablet 3  . lidocaine (XYLOCAINE) 2 % solution Use as directed 15 mLs in the mouth or throat every 3 (three) hours as needed for mouth pain. Gargle and spit every three hours as needed. 100 mL 0  . lidocaine-prilocaine (EMLA) cream Apply topically once. 30 each 2  . lisinopril (PRINIVIL,ZESTRIL) 10 MG tablet Take 1 tablet (10 mg total) by mouth daily. 90 tablet 1  . LORazepam (ATIVAN) 0.5 MG tablet Take 1 tablet (0.5 mg total) by mouth every 8 (eight) hours as needed for anxiety (or nausea). Could take 2 tab 30-45 mins prior to PET/CT and  MRI Brain 60 tablet 0  . magic mouthwash w/lidocaine SOLN Take 5 mLs by mouth 4 (four) times daily as needed for mouth pain. 200 mL 0  . mirtazapine (REMERON) 15 MG tablet TAKE ONE-HALF TABLET (7.5 MG TOTAL) BY MOUTH AT BEDTIME 30 tablet 6  . ondansetron (ZOFRAN-ODT) 4 MG disintegrating tablet Take 4 mg by mouth every 8 (eight) hours as needed for nausea or vomiting.    Glory Rosebush DELICA LANCETS FINE MISC Test once daily. 100 each 5  . oxyCODONE (ROXICODONE) 5 MG/5ML solution Take 5-10 mLs (5-10 mg total) by mouth every 4 (four) hours as needed for moderate pain, severe pain or breakthrough pain. 473 mL 0  . polyethylene glycol (MIRALAX) packet Take 17 g by mouth daily. 30 each 1  . senna-docusate (SENNA S) 8.6-50 MG tablet Take 2 tablets by mouth at bedtime as needed for mild constipation. 60 tablet 1  . simvastatin (ZOCOR) 20 MG tablet Take 1 tablet (20 mg total) by mouth at bedtime. 90 tablet 3  . sitaGLIPtin (JANUVIA) 100 MG tablet Take 1 tablet (100 mg total) by mouth daily. 30 tablet 3  . sucralfate (CARAFATE) 1 G tablet Dissolve 1 tablet in 10 mL H20 and swallow 5 min before meals 60 tablet 5  . sucralfate (CARAFATE) 1 G tablet Take 1 tablet (1 g total) by mouth 4 (four) times daily -  with meals and at bedtime. 90 tablet 1  . vitamin B-12 (CYANOCOBALAMIN) 1000 MCG tablet Take 1 tablet (1,000 mcg total) by mouth daily. 90 tablet 3   No current facility-administered medications for this visit.   Facility-Administered Medications Ordered in Other Visits  Medication Dose Route Frequency Provider Last Rate Last Dose  . 0.9 %  sodium chloride infusion   Intravenous Once [name], MD      . sodium chloride 0.9 % injection 10 mL  10 mL Intracatheter PRN [name], MD   10 mL at 11/11/14 1131  . sodium chloride 0.9 % injection 10 mL  10 mL Intravenous PRN [name], MD   10 mL at 01/18/15 1527    PHYSICAL EXAMINATION: ECOG PERFORMANCE STATUS: 2 - Symptomatic, <50%  confined to bed  . Filed Vitals:   02/15/15 0948 02/15/15 0949  BP: 92/53 105/51  Pulse: 107   Temp: 97.4 F (36.3 C)   Resp: 16     Filed Weights   02/15/15 0948  Weight: 148 lb (67.132 kg)   .Body mass index is 27.06 kg/(m^2).  GENERAL:alert, in no acute distress and comfortable SKIN: skin color, texture, turgor are normal, no rashes or significant lesions EYES: normal, conjunctiva are pink and non-injected, sclera clear OROPHARYNX:no exudate, no erythema and lips, buccal mucosa, and tongue normal  NECK: supple, no JVD, thyroid normal size, non-tender, without nodularity LYMPH:  no palpable lymphadenopathy in the cervical, axillary or  inguinal LUNGS: clear to auscultation with normal respiratory effort HEART: regular rate & rhythm,  no murmurs and no lower extremity edema ABDOMEN: abdomen soft, non-tender, normoactive bowel sounds  Musculoskeletal: no cyanosis of digits and no clubbing  PSYCH: alert & oriented x 3 with fluent speech NEURO: no focal motor/sensory deficits  LABORATORY DATA:   I have reviewed the data as listed  . CBC Latest Ref Rng 02/15/2015 01/25/2015 01/18/2015  WBC 3.9 - 10.3 10e3/uL 2.3(L) 5.7 4.3  Hemoglobin 11.6 - 15.9 g/dL 8.3(L) 9.6(L) 6.1 confirmed by restick(LL)  Hematocrit 34.8 - 46.6 % 25.4(L) 29.7(L) 18.6(L)  Platelets 145 - 400 10e3/uL 117(L) 201 160  . CMP Latest Ref Rng 02/15/2015 01/25/2015 01/18/2015  Glucose 70 - 140 mg/dl 152(H) 146(H) 136  BUN 7.0 - 26.0 mg/dL 21.2 20.9 19.3  Creatinine 0.6 - 1.1 mg/dL 1.3(H) 1.2(H) 1.2(H)  Sodium 136 - 145 mEq/L 139 140 139  Potassium 3.5 - 5.1 mEq/L 4.4 4.7 4.5  Chloride 101 - 111 mmol/L - - -  CO2 22 - 29 mEq/L '25 24 22  '$ Calcium 8.4 - 10.4 mg/dL 8.5 8.8 8.8  Total Protein 6.4 - 8.3 g/dL 6.5 6.5 6.3(L)  Total Bilirubin 0.20 - 1.20 mg/dL 0.33 0.31 <0.30  Alkaline Phos 40 - 150 U/L 90 66 71  AST 5 - 34 U/L '24 17 10  '$ ALT 0 - 55 U/L 35 19 9      RADIOGRAPHIC STUDIES: I have personally  reviewed the radiological images as listed and agreed with the findings in the report. No results found.  ASSESSMENT & PLAN:   74 year old female with history of heavy smoking with  #1 Limited stage Small cell lung cancer. CT scan on 09/26/2014 at the outside hospital showed mediastinal adenopathy which was bronchoscopically biopsied and noted to be consistent with small cell lung cancer. No overt focal neurological deficits or symptoms suggestive of brain metastases at this time. Patient is status post 5 cycles of carboplatin etoposide with concurrent radiation therapy. She was noted to have good response on an interim PET CT scan on 11/29/2014. #2 normocytic normochromic anemia related to chemotherapy required blood transfusions last time. Thrombocytopenia related to chemotherapy. #3 alopecia related to chemotherapy #4 radiation esophagitis-resolved  #5 protein calorie malnutrition and weight loss due to small cell lung cancer.Improving #6 ex-smoker 90-pack-year history of smoking at one month ago. Continues to stay off her cigarettes. #9 COPD -- has never been diagnosed as such. He has been recently given an albuterol inhaler as needed. Not oxygen dependent. #10 hypothyroidism -continue with levothyroxine replacement #11 Diabetes patient has been controlled with oral hypoglycemics. She has lost about 15-20 pounds. #12 severe anxiety.Patient appears much improved on Celexa and when necessary lorazepam. Plan -Patient is status post 5 cycles of carboplatin etoposide along with concurrent radiation therapy. -Labs today show persistent cytopenias and neutropenia. After discussion with the patient and her son we made a decision to discontinue cycle 6 of chemotherapy due to diminishing benefits and increasing toxicities. -Repeat PET/CT scan and 3 Tesla MRI of the brain for restaging of small cell lung cancer. -Followed by Dr. Isidore Moos in radiation oncology after scans. Consideration for  prophylactic cranial irradiation. If there is no evidence of disease progression on restaging scans. -Encourage clear fluids and oral intake.  I spent 20 minutes counseling the patient face to face. The total time spent in the appointment was 25 minutes and more than 50% was on counseling and direct patient cares.    Gautam  Irene Limbo MD [place] [facility] Hematology/Oncology Physician [facility]  (Office):       [phone] (Work cell):  [phone] (Fax):           [phone]  . Orders Placed This Encounter  Procedures  . MR Brain W Wo Contrast    Standing Status: Future     Number of Occurrences:      Standing Expiration Date: 02/15/2016    Order Specific Question:  If indicated for the ordered procedure, I authorize the administration of contrast media per Radiology protocol    Answer:  Yes    Order Specific Question:  Reason for Exam (SYMPTOM  OR DIAGNOSIS REQUIRED)    Answer:  post-treatment restaging for small cell lung cancer .    Order Specific Question:  Preferred imaging location?    Answer:  [facility]    Order Specific Question:  Does the patient have a pacemaker or implanted devices?    Answer:  No    Order Specific Question:  What is the patient's sedation requirement?    Answer:  Anti-anxiety  . NM PET Image Restag (PS) Skull Base To Thigh    Standing Status: Future     Number of Occurrences:      Standing Expiration Date: 02/15/2016    Order Specific Question:  Reason for Exam (SYMPTOM  OR DIAGNOSIS REQUIRED)    Answer:  post-treatment restaging for small cell lung cancer .    Order Specific Question:  Preferred imaging location?    Answer:  [facility]    Order Specific Question:  If indicated for the ordered procedure, I authorize the administration of a radiopharmaceutical per Radiology protocol    Answer:  Yes  . CBC & Diff and Retic    Standing Status: Future     Number of Occurrences:      Standing Expiration Date:  03/21/2016  . Comprehensive metabolic panel    Standing Status: Future     Number of Occurrences:      Standing Expiration Date: 02/15/2016

## 2015-03-01 ENCOUNTER — Other Ambulatory Visit: Payer: Medicare Other

## 2015-03-01 ENCOUNTER — Ambulatory Visit: Payer: Medicare Other | Admitting: Hematology

## 2015-03-06 ENCOUNTER — Ambulatory Visit (HOSPITAL_COMMUNITY)
Admission: RE | Admit: 2015-03-06 | Discharge: 2015-03-06 | Disposition: A | Discharge status: Home / Self Care | Payer: Medicare Other | Source: Ambulatory Visit | Attending: Hematology | Admitting: Hematology

## 2015-03-06 DIAGNOSIS — C3491 Malignant neoplasm of unspecified part of right bronchus or lung: Secondary | ICD-10-CM | POA: Insufficient documentation

## 2015-03-06 DIAGNOSIS — G939 Disorder of brain, unspecified: Secondary | ICD-10-CM | POA: Insufficient documentation

## 2015-03-06 DIAGNOSIS — I2510 Atherosclerotic heart disease of native coronary artery without angina pectoris: Secondary | ICD-10-CM | POA: Insufficient documentation

## 2015-03-06 DIAGNOSIS — K8020 Calculus of gallbladder without cholecystitis without obstruction: Secondary | ICD-10-CM | POA: Diagnosis not present

## 2015-03-06 LAB — GLUCOSE, CAPILLARY: GLUCOSE-CAPILLARY: 131 mg/dL — AB (ref 65–99)

## 2015-03-06 MED ORDER — FLUDEOXYGLUCOSE F - 18 (FDG) INJECTION
7.3600 | Freq: Once | INTRAVENOUS | Status: AC | PRN
  Administered 2015-03-06: 7.36 via INTRAVENOUS

## 2015-03-06 MED ORDER — GADOBENATE DIMEGLUMINE 529 MG/ML IV SOLN
10.0000 mL | Freq: Once | INTRAVENOUS | Status: AC | PRN
  Administered 2015-03-06: 7 mL via INTRAVENOUS

## 2015-03-07 ENCOUNTER — Encounter: Payer: Self-pay | Admitting: Hematology

## 2015-03-07 ENCOUNTER — Ambulatory Visit (HOSPITAL_COMMUNITY): Payer: Medicare Other

## 2015-03-07 ENCOUNTER — Ambulatory Visit (HOSPITAL_BASED_OUTPATIENT_CLINIC_OR_DEPARTMENT_OTHER): Payer: Medicare Other

## 2015-03-07 ENCOUNTER — Encounter: Payer: Self-pay | Admitting: General Practice

## 2015-03-07 ENCOUNTER — Other Ambulatory Visit (HOSPITAL_BASED_OUTPATIENT_CLINIC_OR_DEPARTMENT_OTHER): Payer: Medicare Other

## 2015-03-07 ENCOUNTER — Other Ambulatory Visit: Payer: Self-pay | Admitting: *Deleted

## 2015-03-07 ENCOUNTER — Telehealth: Payer: Self-pay | Admitting: Hematology

## 2015-03-07 ENCOUNTER — Ambulatory Visit (HOSPITAL_BASED_OUTPATIENT_CLINIC_OR_DEPARTMENT_OTHER): Payer: Medicare Other | Admitting: Hematology

## 2015-03-07 VITALS — BP 124/69 | HR 92 | Temp 98.4°F | Resp 18 | Ht 62.0 in | Wt 150.6 lb

## 2015-03-07 DIAGNOSIS — D6959 Other secondary thrombocytopenia: Secondary | ICD-10-CM

## 2015-03-07 DIAGNOSIS — C3491 Malignant neoplasm of unspecified part of right bronchus or lung: Secondary | ICD-10-CM

## 2015-03-07 DIAGNOSIS — C3490 Malignant neoplasm of unspecified part of unspecified bronchus or lung: Secondary | ICD-10-CM

## 2015-03-07 DIAGNOSIS — E039 Hypothyroidism, unspecified: Secondary | ICD-10-CM

## 2015-03-07 DIAGNOSIS — E119 Type 2 diabetes mellitus without complications: Secondary | ICD-10-CM

## 2015-03-07 DIAGNOSIS — Z452 Encounter for adjustment and management of vascular access device: Secondary | ICD-10-CM | POA: Diagnosis not present

## 2015-03-07 DIAGNOSIS — D6481 Anemia due to antineoplastic chemotherapy: Secondary | ICD-10-CM | POA: Diagnosis not present

## 2015-03-07 DIAGNOSIS — D61810 Antineoplastic chemotherapy induced pancytopenia: Secondary | ICD-10-CM

## 2015-03-07 DIAGNOSIS — T451X5A Adverse effect of antineoplastic and immunosuppressive drugs, initial encounter: Secondary | ICD-10-CM

## 2015-03-07 DIAGNOSIS — Z95828 Presence of other vascular implants and grafts: Secondary | ICD-10-CM

## 2015-03-07 DIAGNOSIS — Z760 Encounter for issue of repeat prescription: Secondary | ICD-10-CM

## 2015-03-07 LAB — CBC & DIFF AND RETIC
BASO%: 0.2 % (ref 0.0–2.0)
BASOS ABS: 0 10*3/uL (ref 0.0–0.1)
EOS%: 1.4 % (ref 0.0–7.0)
Eosinophils Absolute: 0.1 10*3/uL (ref 0.0–0.5)
HEMATOCRIT: 27.7 % — AB (ref 34.8–46.6)
HGB: 9.2 g/dL — ABNORMAL LOW (ref 11.6–15.9)
IMMATURE RETIC FRACT: 13 % — AB (ref 1.60–10.00)
LYMPH#: 1 10*3/uL (ref 0.9–3.3)
LYMPH%: 17.3 % (ref 14.0–49.7)
MCH: 32.2 pg (ref 25.1–34.0)
MCHC: 33.2 g/dL (ref 31.5–36.0)
MCV: 96.9 fL (ref 79.5–101.0)
MONO#: 1.1 10*3/uL — ABNORMAL HIGH (ref 0.1–0.9)
MONO%: 18.5 % — ABNORMAL HIGH (ref 0.0–14.0)
NEUT#: 3.6 10*3/uL (ref 1.5–6.5)
NEUT%: 62.6 % (ref 38.4–76.8)
PLATELETS: 163 10*3/uL (ref 145–400)
RBC: 2.86 10*6/uL — ABNORMAL LOW (ref 3.70–5.45)
RDW: 19 % — ABNORMAL HIGH (ref 11.2–14.5)
RETIC CT ABS: 98.38 10*3/uL — AB (ref 33.70–90.70)
Retic %: 3.44 % — ABNORMAL HIGH (ref 0.70–2.10)
WBC: 5.8 10*3/uL (ref 3.9–10.3)

## 2015-03-07 LAB — COMPREHENSIVE METABOLIC PANEL
ALBUMIN: 3.2 g/dL — AB (ref 3.5–5.0)
ALK PHOS: 88 U/L (ref 40–150)
ALT: 34 U/L (ref 0–55)
ANION GAP: 7 meq/L (ref 3–11)
AST: 27 U/L (ref 5–34)
BILIRUBIN TOTAL: 0.36 mg/dL (ref 0.20–1.20)
BUN: 19.9 mg/dL (ref 7.0–26.0)
CALCIUM: 8.7 mg/dL (ref 8.4–10.4)
CO2: 23 mEq/L (ref 22–29)
CREATININE: 1.1 mg/dL (ref 0.6–1.1)
Chloride: 110 mEq/L — ABNORMAL HIGH (ref 98–109)
EGFR: 49 mL/min/{1.73_m2} — ABNORMAL LOW (ref >90)
Glucose: 109 mg/dl (ref 70–140)
Potassium: 4.3 mEq/L (ref 3.5–5.1)
Sodium: 140 mEq/L (ref 136–145)
Total Protein: 6.6 g/dL (ref 6.4–8.3)

## 2015-03-07 MED ORDER — CITALOPRAM HYDROBROMIDE 10 MG PO TABS: 10.0000 mg | ORAL_TABLET | Freq: Every day | ORAL | Status: DC

## 2015-03-07 MED ORDER — LORAZEPAM 0.5 MG PO TABS: 0.5000 mg | ORAL_TABLET | Freq: Three times a day (TID) | ORAL | Status: DC | PRN

## 2015-03-07 MED ORDER — SODIUM CHLORIDE 0.9 % IJ SOLN
10.0000 mL | INTRAMUSCULAR | Status: DC | PRN
  Administered 2015-03-07: 10 mL via INTRAVENOUS
  Filled 2015-03-07: qty 10

## 2015-03-07 MED ORDER — HEPARIN SOD (PORK) LOCK FLUSH 100 UNIT/ML IV SOLN
500.0000 [IU] | Freq: Once | INTRAVENOUS | Status: AC
  Administered 2015-03-07: 500 [IU] via INTRAVENOUS
  Filled 2015-03-07: qty 5

## 2015-03-07 NOTE — Progress Notes (Signed)
Spiritual Care Note  Spoke with pt, who goes by Rachel Chapman, son Rachel Chapman, and DIL Rachel Chapman in lobby today.  Chickie's affect appeared brighter.  She and family reported more energy, better eating and hydration, improved morale.  Pt verbalized personal unclarity about pursuing prophylactic radiation, though children encouraged her.  Family values chaplain support/encouragement, particularly witness (opportunity to be seen and heard).  Also provided 1:1 reflective listening and emotional support to son.  Following for support, but please also page as needs arise/circumstances change.  Thank you.  [place], [place], [facility] Pager [phone] Voicemail  [phone]

## 2015-03-07 NOTE — Telephone Encounter (Signed)
Talked with and scheduled this patient’s appointment(s) while patient was here in our office.       [facility]. °

## 2015-03-08 ENCOUNTER — Encounter: Payer: Self-pay | Admitting: Podiatry

## 2015-03-08 ENCOUNTER — Ambulatory Visit (INDEPENDENT_AMBULATORY_CARE_PROVIDER_SITE_OTHER): Payer: Medicare Other | Admitting: Podiatry

## 2015-03-08 DIAGNOSIS — M79676 Pain in unspecified toe(s): Secondary | ICD-10-CM

## 2015-03-08 DIAGNOSIS — E119 Type 2 diabetes mellitus without complications: Secondary | ICD-10-CM

## 2015-03-08 DIAGNOSIS — B351 Tinea unguium: Secondary | ICD-10-CM | POA: Diagnosis not present

## 2015-03-08 DIAGNOSIS — M79609 Pain in unspecified limb: Principal | ICD-10-CM

## 2015-03-08 NOTE — Progress Notes (Signed)
   Subjective:    Patient ID: Rachel Chapman, female    DOB: 1940/10/04, 75 y.o.   MRN: [id number]  HPI This patient presents to the office for treatment of her nails.  She says she has moved from [facility] for treatment and her nails are painful walking and wearing her shoes.  She is diabetic with no foot complications.  She presents for preventive foot care services. The patient presents here today for B/L toenail debridement..    Review of Systems  HENT: Positive for hearing loss.   Eyes: Positive for visual disturbance.  Respiratory: Positive for cough, shortness of breath and wheezing.   Gastrointestinal: Positive for nausea, vomiting, diarrhea and constipation.  Musculoskeletal: Positive for gait problem.  Neurological: Positive for dizziness and light-headedness.       Objective:   Physical Exam GENERAL APPEARANCE: Alert, conversant. Appropriately groomed. No acute distress.  VASCULAR: Pedal pulses palpable at  [facility] and PT bilateral.  Capillary refill time is immediate to all digits,  Normal temperature gradient.  Digital hair growth is present bilateral  NEUROLOGIC: sensation is normal to 5.07 monofilament at 5/5 sites bilateral.  Light touch is intact bilateral, Muscle strength normal.  MUSCULOSKELETAL: acceptable muscle strength, tone and stability bilateral.  Intrinsic muscluature intact bilateral.  Rectus appearance of foot and digits noted bilateral.   DERMATOLOGIC: skin color, texture, and turgor are within normal limits.  No preulcerative lesions or ulcers  are seen, no interdigital maceration noted.  No open lesions present.  . No drainage noted.  Nails  Thick disfigured discolored nails both feet with no infections or ulcers.        Assessment & Plan:  Onychomycosis  IE  Debridement and grinding of long thick nails.   Gardiner Barefoot DPM

## 2015-03-09 NOTE — Progress Notes (Signed)
Rachel Chapman  HEMATOLOGY ONCOLOGY PROGRESS NOTE  Date of service: .03/07/2015   Patient Care Team: [name], NP as PCP - General (Family Medicine)  Diagnosis: Limited stage small cell lung cancer  Current Treatment:   Status post 5 cycles of carboplatin and etoposide Status post Concurrent radiation therapy completed 12/16/2014.  INTERVAL HISTORY:  Patient is here for her scheduled follow-up in clinic after completion of her planned treatment for limited stage small cell lung cancer.  She is no acute new concerns.  Notes that her energy levels have improved since being off chemotherapy.  She has been quite active and has not been using the wheelchair.  Her PET/CT scan and MRI of the brain showed no evidence of persistent/recurrent disease. She has been eating well. She has an upcoming follow up with Dr. Isidore Moos in radiation oncology to discuss the option of prophylactic cranial irradiation. She is happy with the results of her imaging study and response to treatment thus far.   REVIEW OF SYSTEMS:    10 Point review of systems of done and is negative except as noted above.  . Past Medical History  Diagnosis Date  . Hypertension   . Dyslipidemia   . Diabetes ([facility])   . Hypothyroidism   . Pneumonia 2007  . Smoker     1.5 packs per day for 60 years.  Quit one month ago  . Anxiety   . Cancer ([facility])   . Lung cancer ([place]) 8/216    hilar/mediastinum lung scca    . Past Surgical History  Procedure Laterality Date  . Cesarean section      . Social History  Substance Use Topics  . Smoking status: Former Smoker -- 1.50 packs/day for 60 years    Quit date: 09/12/2014  . Smokeless tobacco: None  . Alcohol Use: No    ALLERGIES:  has No Known Allergies.  MEDICATIONS:  Current Outpatient Prescriptions  Medication Sig Dispense Refill  . albuterol (PROVENTIL HFA;VENTOLIN HFA) 108 (90 BASE) MCG/ACT inhaler Inhale 2 puffs into the lungs every 6 (six) hours as needed for shortness of  breath. 3.7 g 3  . cholecalciferol (VITAMIN D) 1000 UNITS tablet Take 1 tablet (1,000 Units total) by mouth daily. 90 tablet 3  . citalopram (CELEXA) 10 MG tablet Take 1 tablet (10 mg total) by mouth daily. 30 tablet 3  . glucose blood (ONETOUCH VERIO) test strip Test once daily. (Patient taking differently: 1 each by Other route. One to two times daily,) 100 each 5  . levothyroxine (SYNTHROID, LEVOTHROID) 112 MCG tablet Take 1 tablet (112 mcg total) by mouth 2 (two) times daily. 90 tablet 3  . lidocaine-prilocaine (EMLA) cream Apply topically once. 30 each 2  . lisinopril (PRINIVIL,ZESTRIL) 10 MG tablet Take 1 tablet (10 mg total) by mouth daily. 90 tablet 1  . LORazepam (ATIVAN) 0.5 MG tablet Take 1 tablet (0.5 mg total) by mouth every 8 (eight) hours as needed for anxiety (or nausea). Could take 2 tab 30-45 mins prior to PET/CT and MRI Brain 50 tablet 0  . mirtazapine (REMERON) 15 MG tablet TAKE ONE-HALF TABLET (7.5 MG TOTAL) BY MOUTH AT BEDTIME 30 tablet 6  . ondansetron (ZOFRAN-ODT) 4 MG disintegrating tablet Take 4 mg by mouth every 8 (eight) hours as needed for nausea or vomiting.    Glory Rosebush DELICA LANCETS FINE MISC Test once daily. 100 each 5  . oxyCODONE (ROXICODONE) 5 MG/5ML solution Take 5-10 mLs (5-10 mg total) by mouth every 4 (four) hours  as needed for moderate pain, severe pain or breakthrough pain. 473 mL 0  . polyethylene glycol (MIRALAX) packet Take 17 g by mouth daily. 30 each 1  . senna-docusate (SENNA S) 8.6-50 MG tablet Take 2 tablets by mouth at bedtime as needed for mild constipation. 60 tablet 1  . simvastatin (ZOCOR) 20 MG tablet Take 1 tablet (20 mg total) by mouth at bedtime. 90 tablet 3  . sitaGLIPtin (JANUVIA) 100 MG tablet Take 1 tablet (100 mg total) by mouth daily. 30 tablet 3  . vitamin B-12 (CYANOCOBALAMIN) 1000 MCG tablet Take 1 tablet (1,000 mcg total) by mouth daily. 90 tablet 3   No current facility-administered medications for this visit.    Facility-Administered Medications Ordered in Other Visits  Medication Dose Route Frequency Provider Last Rate Last Dose  . 0.9 %  sodium chloride infusion   Intravenous Once [name], MD      . sodium chloride 0.9 % injection 10 mL  10 mL Intracatheter PRN [name], MD   10 mL at 11/11/14 1131  . sodium chloride 0.9 % injection 10 mL  10 mL Intravenous PRN [name], MD   10 mL at 01/18/15 1527    PHYSICAL EXAMINATION: ECOG PERFORMANCE STATUS: 2 - Symptomatic, <50% confined to bed  . Filed Vitals:   03/07/15 0942  BP: 124/69  Pulse: 92  Temp: 98.4 F (36.9 C)  Resp: 18    Filed Weights   03/07/15 0942  Weight: 150 lb 9.6 oz (68.312 kg)   .Body mass index is 27.54 kg/(m^2).  GENERAL:alert, in no acute distress and comfortable SKIN: skin color, texture, turgor are normal, no rashes or significant lesions EYES: normal, conjunctiva are pink and non-injected, sclera clear OROPHARYNX:no exudate, no erythema and lips, buccal mucosa, and tongue normal  NECK: supple, no JVD, thyroid normal size, non-tender, without nodularity LYMPH:  no palpable lymphadenopathy in the cervical, axillary or inguinal LUNGS: clear to auscultation with normal respiratory effort HEART: regular rate & rhythm,  no murmurs and no lower extremity edema ABDOMEN: abdomen soft, non-tender, normoactive bowel sounds  Musculoskeletal: no cyanosis of digits and no clubbing  PSYCH: alert & oriented x 3 with fluent speech NEURO: no focal motor/sensory deficits  LABORATORY DATA:   I have reviewed the data as listed  . CBC Latest Ref Rng 03/07/2015 02/15/2015 01/25/2015  WBC 3.9 - 10.3 10e3/uL 5.8 2.3(L) 5.7  Hemoglobin 11.6 - 15.9 g/dL 9.2(L) 8.3(L) 9.6(L)  Hematocrit 34.8 - 46.6 % 27.7(L) 25.4(L) 29.7(L)  Platelets 145 - 400 10e3/uL 163 117(L) 201  . CMP Latest Ref Rng 03/07/2015 02/15/2015 01/25/2015  Glucose 70 - 140 mg/dl 109 152(H) 146(H)  BUN 7.0 - 26.0 mg/dL 19.9 21.2 20.9   Creatinine 0.6 - 1.1 mg/dL 1.1 1.3(H) 1.2(H)  Sodium 136 - 145 mEq/L 140 139 140  Potassium 3.5 - 5.1 mEq/L 4.3 4.4 4.7  Chloride 101 - 111 mmol/L - - -  CO2 22 - 29 mEq/L '23 25 24  '$ Calcium 8.4 - 10.4 mg/dL 8.7 8.5 8.8  Total Protein 6.4 - 8.3 g/dL 6.6 6.5 6.5  Total Bilirubin 0.20 - 1.20 mg/dL 0.36 0.33 0.31  Alkaline Phos 40 - 150 U/L 88 90 66  AST 5 - 34 U/L '27 24 17  '$ ALT 0 - 55 U/L 34 35 19      RADIOGRAPHIC STUDIES: I have personally reviewed the radiological images as listed and agreed with the findings in the report. Mr Jeri Cos Wo Contrast  03/06/2015  CLINICAL DATA:  Small cell lung cancer diagnosed is 5 months ago. Restaging. Half dose contrast was administered due to renal insufficiency with a GFR of 44. EXAM: MRI HEAD WITHOUT AND WITH CONTRAST TECHNIQUE: Multiplanar, multiecho pulse sequences of the brain and surrounding structures were obtained without and with intravenous contrast. CONTRAST:  79m MULTIHANCE GADOBENATE DIMEGLUMINE 529 MG/ML IV SOLN COMPARISON:  MRI brain 10/19/2014. FINDINGS: The previously noted area of enhancement along the left superior frontal gyrus is not evident on today's study. No focal enhancement is present to suggest metastatic disease to the brain or meninges. Mild atrophy and white matter disease is stable. A focal nonenhancing extra-axial lesion adjacent to the left parietal lobe on image 11 of series 6 is stable measuring 5 mm. This likely represents a small meningioma or osteoma. Flow is present in the major intracranial arteries. The bilateral lens replacements are present. The paranasal sinuses and mastoid air cells are clear. IMPRESSION: 1. No evidence for metastatic disease to the brain or meninges. 2. Stable 5 mm extra-axial lesion adjacent to the left parietal lobe, likely calcified. 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 There is no significant associated enhancement. 3. Mild atrophy and white matter disease.  Electronically Signed   By: CSan MorelleM.D.   On: 03/06/2015 08:23   Nm Pet Image Restag (ps) Skull Base To Thigh  03/06/2015  CLINICAL DATA:  Subsequent treatment strategy for lung cancer. EXAM: NUCLEAR MEDICINE PET SKULL BASE TO THIGH TECHNIQUE: 7.4 mCi F-18 FDG was injected intravenously. Full-ring PET imaging was performed from the skull base to thigh after the radiotracer. CT data was obtained and used for attenuation correction and anatomic localization. FASTING BLOOD GLUCOSE:  Value: 131 mg/dl COMPARISON:  11/29/2014.  MR brain 03/06/2015. FINDINGS: NECK No hypermetabolic lymph nodes in the neck. CT images show no acute findings. CHEST Mild hypermetabolism in the thyroid, as before. No hypermetabolic mediastinal, hilar or axillary lymph nodes. Specifically, previously seen hypermetabolism within residual soft tissue in the right paratracheal and subcarinal regions has resolved. No abnormal pulmonary parenchymal hypermetabolism. Minimal residual hypermetabolism in the mid esophagus, possibly treatment related. CT images show a right IJ Port-A-Cath terminating in the high right atrium. No pericardial effusion. Trace right pleural effusion. Posttreatment changes in the right lung with subpleural scarring in the left upper lobe. ABDOMEN/PELVIS No abnormal hypermetabolism in the liver, adrenal glands, spleen or pancreas. No hypermetabolic lymph nodes. CT images show the liver to be grossly unremarkable. Small stones are seen in the gallbladder. Adrenal glands, kidneys, spleen, pancreas, stomach and bowel are grossly unremarkable. No free fluid. SKELETON No abnormal osseous hypermetabolism.  Chronic L5 pars defects. IMPRESSION: 1. Previously seen hypermetabolic right paratracheal and subcarinal soft tissue no longer shows abnormal FDG uptake. 2. Minimal residual hypermetabolism within the mid esophagus, possibly treatment related. 3. Three-vessel coronary artery calcification. 4. Trace right pleural  effusion. 5. Cholelithiasis. Electronically Signed   By: MLorin PicketM.D.   On: 03/06/2015 10:25    ASSESSMENT & PLAN:   75year old female with history of heavy smoking with  #1 Limited stage Small cell lung cancer. CT scan on 09/26/2014 at the outside hospital showed mediastinal adenopathy which was bronchoscopically biopsied and noted to be consistent with small cell lung cancer. No overt focal neurological deficits or symptoms suggestive of brain metastases at this time. Patient is status post 5 cycles of carboplatin etoposide with concurrent radiation therapy. She was noted to have good response on  an interim PET CT scan on 11/29/2014. PET CT scan after completion of 5 cycles of carboplatin and etoposide chemotherapy with concurrent radiation showed resolution of her lung mass.  MRI of the brain shows no evidence of metastatic disease at this time. #2 normocytic normochromic anemia related to chemotherapy required blood transfusions last time. Thrombocytopenia related to chemotherapy.improving #3 alopecia related to chemotherapy.  Patient notes that she is starting to get some had again.  She understands that she'll lose a little bit after radiation therapy #4 radiation esophagitis-resolved  #5 protein calorie malnutrition and weight loss due to small cell lung cancer. Much improved. #6 ex-smoker 90-pack-year history of smoking at one month ago. Continues to stay off her cigarettes. #9 COPD -- has never been diagnosed as such. He has been recently given an albuterol inhaler as needed. Not oxygen dependent. #10 hypothyroidism -continue with levothyroxine replacement #11 Diabetes patient has been controlled with oral hypoglycemics. She has lost about 15-20 pounds. #12 severe anxiety.Patient appears much improved on Celexa and when necessary lorazepam. Plan -I spent 20 min.Reviewing the results of her PET/CT scan and MRI of the brain including lipping of the images with the patient and  her family on PACS. -all of the patient and her family's questions were answered in detail.  Based on the imaging studies she appears to be in radiographic remission at this time.  No clinical evidence of disease progression. -Follow up with Dr. Isidore Moos in radiation oncology as planned for consideration for prophylactic cranial irradiation.  -counseled to continue smoking cessation and maintained good oral intake. -We talked about the importance of maintaining good physical activity level and trying to walk at least 20-30 min daily  RTC with Dr Irene Limbo in 2 months with cbc, cmp. Earlier if any new concerns.  I spent 20 minutes counseling the patient face to face. The total time spent in the appointment was 25 minutes and more than 50% was on counseling and direct patient cares.    Sullivan Lone MD [place] AAHIVMS [facility] Hematology/Oncology Physician [facility]  (Office):       [phone] (Work cell):  [phone] (Fax):           [phone]

## 2015-03-10 ENCOUNTER — Ambulatory Visit: Payer: Medicare Other | Admitting: Podiatry

## 2015-03-10 NOTE — Progress Notes (Signed)
Ms. Rachel Chapman is here for consideration of prophylactic cranial irradiation. She completed radiation for Cancer of the Middle Lobe of her Lung 12/16/14. She denies pain at this time.  She reports she is in remission from her Lung Cancer and is feeling much better. She is eating well and gaining weight and has no other concerns at this time.  BP 146/79 mmHg  Pulse 90  Temp(Src) 97.7 F (36.5 C)  Ht '5\' 2"'$  (1.575 m)  Wt 152 lb 12.8 oz (69.31 kg)  BMI 27.94 kg/m2   Wt Readings from Last 3 Encounters:  03/15/15 152 lb 12.8 oz (69.31 kg)  03/07/15 150 lb 9.6 oz (68.312 kg)  02/15/15 148 lb (67.132 kg)

## 2015-03-15 ENCOUNTER — Ambulatory Visit
Admission: RE | Admit: 2015-03-15 | Discharge: 2015-03-15 | Disposition: A | Discharge status: Home / Self Care | Payer: Medicare Other | Source: Ambulatory Visit | Attending: Radiation Oncology | Admitting: Radiation Oncology

## 2015-03-15 ENCOUNTER — Encounter: Payer: Self-pay | Admitting: Radiation Oncology

## 2015-03-15 VITALS — BP 146/79 | HR 90 | Temp 97.7°F | Ht 62.0 in | Wt 152.8 lb

## 2015-03-15 DIAGNOSIS — C7931 Secondary malignant neoplasm of brain: Secondary | ICD-10-CM | POA: Diagnosis present

## 2015-03-15 DIAGNOSIS — Z79899 Other long term (current) drug therapy: Secondary | ICD-10-CM | POA: Insufficient documentation

## 2015-03-15 NOTE — Progress Notes (Signed)
  Radiation Oncology         [phone] ________________________________  Name: Rachel Chapman MRN: [id number]  Date: 03/15/2015  DOB: 1941-01-04  SIMULATION AND TREATMENT PLANNING NOTE  Outpatient  DIAGNOSIS:     ICD-9-CM ICD-10-CM   1. Risk of brain metastases warranting prophylactic brain radiation 198.3 C79.31     NARRATIVE:  The patient was brought to the [facility].  Identity was confirmed.  All relevant records and images related to the planned course of therapy were reviewed.  The patient freely provided informed written consent to proceed with treatment after reviewing the details related to the planned course of therapy. The consent form was witnessed and verified by the simulation staff.    Then, the patient was set-up in a stable reproducible  supine position for radiation therapy.  An Aquaplast facemask was custom fitted to the patient's anatomy.  CT images were obtained.  Surface markings were placed.  The CT images were loaded into the planning software.      TREATMENT PLANNING NOTE: Treatment planning then occurred.  The radiation prescription was entered and confirmed.    A total of 3 medically necessary complex treatment devices were fabricated and supervised by me, in the form of an Aquaplast mask and 2 fields with MLCs to block globes, pharynx, spinal cord, lenses. Wedges will be used as needed for dose homogeneity.  MORE FIELDS WITH MLCs MAY BE ADDED IN DOSIMETRY for dose homogeneity.  I have requested : Isodose Plan.     The patient will receive 25 Gy in 10 fractions to the whole brain.  -----------------------------------  [name], MD

## 2015-03-15 NOTE — Addendum Note (Signed)
Encounter addended by: Ernst Spell, RN on: 03/15/2015 10:37 AM<BR>     Documentation filed: Charges VN

## 2015-03-15 NOTE — Progress Notes (Signed)
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 Risk of brain metastases warranting prophylactic brain radiation 198.3 C79.31    Radiation treatment dates:   11/07/2014-12/16/2014 Site/dose: Right Lung. 60 Gy in 30 fractions.  Narrative:  The patient returns today for routine follow-up She underwent restaging scans late last month, January.  BRAIN MRI negative for metastases.  PET of body shows complete metabolic response to treatment. Scans reviewed by me.           Gaining weight, feeling good, denies persistent esophagitis.                     ALLERGIES:  has No Known Allergies.  Meds: Current Outpatient Prescriptions  Medication Sig Dispense Refill  . albuterol (PROVENTIL HFA;VENTOLIN HFA) 108 (90 BASE) MCG/ACT inhaler Inhale 2 puffs into the lungs every 6 (six) hours as needed for shortness of breath. 3.7 g 3  . cholecalciferol (VITAMIN D) 1000 UNITS tablet Take 1 tablet (1,000 Units total) by mouth daily. 90 tablet 3  . citalopram (CELEXA) 10 MG tablet Take 1 tablet (10 mg total) by mouth daily. 30 tablet 3  . glucose blood (ONETOUCH VERIO) test strip Test once daily. (Patient taking differently: 1 each by Other route. One to two times daily,) 100 each 5  . levothyroxine (SYNTHROID, LEVOTHROID) 112 MCG tablet Take 1 tablet (112 mcg total) by mouth 2 (two) times daily. 90 tablet 3  . lidocaine-prilocaine (EMLA) cream Apply topically once. 30 each 2  . lisinopril (PRINIVIL,ZESTRIL) 10 MG tablet Take 1 tablet (10 mg total) by mouth daily. 90 tablet 1  . LORazepam (ATIVAN) 0.5 MG tablet Take 1 tablet (0.5 mg total) by mouth every 8  (eight) hours as needed for anxiety (or nausea). Could take 2 tab 30-45 mins prior to PET/CT and MRI Brain 50 tablet 0  . mirtazapine (REMERON) 15 MG tablet TAKE ONE-HALF TABLET (7.5 MG TOTAL) BY MOUTH AT BEDTIME 30 tablet 6  . ONETOUCH DELICA LANCETS FINE MISC Test once daily. 100 each 5  . simvastatin (ZOCOR) 20 MG tablet Take 1 tablet (20 mg total) by mouth at bedtime. 90 tablet 3  . sitaGLIPtin (JANUVIA) 100 MG tablet Take 1 tablet (100 mg total) by mouth daily. 30 tablet 3  . vitamin B-12 (CYANOCOBALAMIN) 1000 MCG tablet Take 1 tablet (1,000 mcg total) by mouth daily. 90 tablet 3  . ondansetron (ZOFRAN-ODT) 4 MG disintegrating tablet Take 4 mg by mouth every 8 (eight) hours as needed for nausea or vomiting. Reported on 03/15/2015    . oxyCODONE (ROXICODONE) 5 MG/5ML solution Take 5-10 mLs (5-10 mg total) by mouth every 4 (four) hours as needed for moderate pain, severe pain or breakthrough pain. (Patient not taking: Reported on 03/15/2015) 473 mL 0  . polyethylene glycol (MIRALAX) packet Take 17 g by mouth daily. (Patient not taking: Reported on 03/15/2015) 30 each 1  . senna-docusate (SENNA S) 8.6-50 MG tablet Take 2 tablets by mouth at bedtime as needed for mild constipation. (Patient not taking: Reported on 03/15/2015) 60 tablet 1   No current facility-administered medications for this encounter.   Facility-Administered  Medications Ordered in Other Encounters  Medication Dose Route Frequency Provider Last Rate Last Dose  . 0.9 %  sodium chloride infusion   Intravenous Once [name], MD      . sodium chloride 0.9 % injection 10 mL  10 mL Intracatheter PRN [name], MD   10 mL at 11/11/14 1131  . sodium chloride 0.9 % injection 10 mL  10 mL Intravenous PRN [name], MD   10 mL at 01/18/15 1527    Physical Findings: The patient is in no acute distress. Patient is alert and oriented.  height is '5\' 2"'$  (1.575 m) and weight is 152 lb 12.8 oz (69.31 kg). Her temperature is  97.7 F (36.5 C). Her blood pressure is 146/79 and her pulse is 90. Marland Kitchen    Neuro:Object recall is 3/3 at 3 minutes. Psych: judgment and insight intact Resolving alopecia of scalp  KPS = 90  100 - Normal; no complaints; no evidence of disease. 90   - Able to carry on normal activity; minor signs or symptoms of disease. 80   - Normal activity with effort; some signs or symptoms of disease. 35   - Cares for self; unable to carry on normal activity or to do active work. 60   - Requires occasional assistance, but is able to care for most of his personal needs. 50   - Requires considerable assistance and frequent medical care. 13   - Disabled; requires special care and assistance. 64   - Severely disabled; hospital admission is indicated although death not imminent. 46   - Very sick; hospital admission necessary; active supportive treatment necessary. 10   - Moribund; fatal processes progressing rapidly. 0     - Dead  Karnofsky DA, Abelmann [place], Craver LS and Burchenal [facility] [phone]) The use of the nitrogen mustards in the palliative treatment of carcinoma: with particular reference to bronchogenic carcinoma Cancer 1 [phone]   Lab Findings: Lab Results  Component Value Date   WBC 5.8 03/07/2015   HGB 9.2* 03/07/2015   HCT 27.7* 03/07/2015   MCV 96.9 03/07/2015   PLT 163 03/07/2015    Radiographic Findings: Mr Jeri Cos Wo Contrast  03/06/2015  CLINICAL DATA:  Small cell lung cancer diagnosed is 5 months ago. Restaging. Half dose contrast was administered due to renal insufficiency with a GFR of 44. EXAM: MRI HEAD WITHOUT AND WITH CONTRAST TECHNIQUE: Multiplanar, multiecho pulse sequences of the brain and surrounding structures were obtained without and with intravenous contrast. CONTRAST:  75m MULTIHANCE GADOBENATE DIMEGLUMINE 529 MG/ML IV SOLN COMPARISON:  MRI brain 10/19/2014. FINDINGS: The previously noted area of enhancement along the left superior frontal gyrus is not evident on today's study.  No focal enhancement is present to suggest metastatic disease to the brain or meninges. Mild atrophy and white matter disease is stable. A focal nonenhancing extra-axial lesion adjacent to the left parietal lobe on image 11 of series 6 is stable measuring 5 mm. This likely represents a small meningioma or osteoma. Flow is present in the major intracranial arteries. The bilateral lens replacements are present. The paranasal sinuses and mastoid air cells are clear. IMPRESSION: 1. No evidence for metastatic disease to the brain or meninges. 2. Stable 5 mm extra-axial lesion adjacent to the left parietal lobe, likely calcified. 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 There is no significant associated enhancement. 3. Mild atrophy and white matter disease. Electronically Signed   By: CSan Morelle  M.D.   On: 03/06/2015 08:23   Nm Pet Image Restag (ps) Skull Base To Thigh  03/06/2015  CLINICAL DATA:  Subsequent treatment strategy for lung cancer. EXAM: NUCLEAR MEDICINE PET SKULL BASE TO THIGH TECHNIQUE: 7.4 mCi F-18 FDG was injected intravenously. Full-ring PET imaging was performed from the skull base to thigh after the radiotracer. CT data was obtained and used for attenuation correction and anatomic localization. FASTING BLOOD GLUCOSE:  Value: 131 mg/dl COMPARISON:  11/29/2014.  MR brain 03/06/2015. FINDINGS: NECK No hypermetabolic lymph nodes in the neck. CT images show no acute findings. CHEST Mild hypermetabolism in the thyroid, as before. No hypermetabolic mediastinal, hilar or axillary lymph nodes. Specifically, previously seen hypermetabolism within residual soft tissue in the right paratracheal and subcarinal regions has resolved. No abnormal pulmonary parenchymal hypermetabolism. Minimal residual hypermetabolism in the mid esophagus, possibly treatment related. CT images show a right IJ Port-A-Cath terminating in the high right atrium. No pericardial effusion. Trace  right pleural effusion. Posttreatment changes in the right lung with subpleural scarring in the left upper lobe. ABDOMEN/PELVIS No abnormal hypermetabolism in the liver, adrenal glands, spleen or pancreas. No hypermetabolic lymph nodes. CT images show the liver to be grossly unremarkable. Small stones are seen in the gallbladder. Adrenal glands, kidneys, spleen, pancreas, stomach and bowel are grossly unremarkable. No free fluid. SKELETON No abnormal osseous hypermetabolism.  Chronic L5 pars defects. IMPRESSION: 1. Previously seen hypermetabolic right paratracheal and subcarinal soft tissue no longer shows abnormal FDG uptake. 2. Minimal residual hypermetabolism within the mid esophagus, possibly treatment related. 3. Three-vessel coronary artery calcification. 4. Trace right pleural effusion. 5. Cholelithiasis. Electronically Signed   By: Lorin Picket M.D.   On: 03/06/2015 10:25    Impression/Plan:   I spoke with the patient today about prophylactic cranial irradiation. I would recommend this treatment to decrease her risk of failing in the brain parenchyma. I told her that small cell lung cancer has a predisposition to metastasize to the brain. I explained that chemotherapy has difficulty crossing the blood-brain barrier and therefore it is not effective at preventing brain metastases from forming in the future. I estimated, based on meta-analysis data, that her risk of developing brain tumors in the future is approximately 60% and this risk would be lowered to 30% with prophylactic cranial radiation. I acknowledged that there may be a small improvement in her life expectancy from prophylactic cranial irradiation based on the medical literature as well.  I would prescribe a dose of 25 Gray in 10 fractions which would be delivered over about 2 weeks. I explained that doses have been modified based on clinical data in order to lessen the risk of serious side effects. That being said, I did acknowledge numerous  acute and late side effects that can occur from whole brain radiation. We spoke about the fatigue and skin irritation and hair loss that can occur in in the acute setting. We discussed late effects including but not necessarily limited to slowing of cognition and decreased short-term memory. I told her that it is unlikely that these cognitive changes would be debilitating, but they could be noticeable in the future. It is sometimes hard to tell whether such late effects are from chemotherapy versus radiotherapy.   She is enthusiastic to proceed;  daughter is as well. Consent signed today, will simulate this morning.  Over 25 minutes spent face to face on this encounter, >50% on counseling and care coordination.  _____________________________________     [name], MD  This document serves as a record of services personally performed by [name], MD. It was created on her behalf by [name], a trained medical scribe. The creation of this record is based on the scribe's personal observations and the provider's statements to them. This document has been checked and approved by the attending provider.

## 2015-03-20 ENCOUNTER — Encounter: Payer: Self-pay | Admitting: Radiation Oncology

## 2015-03-20 ENCOUNTER — Ambulatory Visit
Admission: RE | Admit: 2015-03-20 | Discharge: 2015-03-20 | Disposition: A | Discharge status: Home / Self Care | Payer: Medicare Other | Source: Ambulatory Visit | Attending: Radiation Oncology | Admitting: Radiation Oncology

## 2015-03-20 VITALS — BP 114/101 | HR 97 | Temp 97.7°F | Ht 62.0 in | Wt 152.7 lb

## 2015-03-20 DIAGNOSIS — C7931 Secondary malignant neoplasm of brain: Secondary | ICD-10-CM

## 2015-03-20 NOTE — Progress Notes (Signed)
Rachel Chapman presents for her first fraction of radiation to her Brain. She denies pain, nausea, or fatigue at this time and states she feels well.   BP 114/101 mmHg  Pulse 97  Temp(Src) 97.7 F (36.5 C)  Ht '5\' 2"'$  (1.575 m)  Wt 152 lb 11.2 oz (69.264 kg)  BMI 27.92 kg/m2   Wt Readings from Last 3 Encounters:  03/20/15 152 lb 11.2 oz (69.264 kg)  03/15/15 152 lb 12.8 oz (69.31 kg)  03/07/15 150 lb 9.6 oz (68.312 kg)

## 2015-03-20 NOTE — Progress Notes (Signed)
   Weekly Management Note:  Outpatient    ICD-9-CM ICD-10-CM   1. Risk of brain metastases warranting prophylactic brain radiation 198.3 C79.31     Current Dose:  2.5 Gy  Projected Dose: 25 Gy   Narrative:  The patient presents for routine under treatment assessment.  CBCT/MVCT images/Port film x-rays were reviewed.  The chart was checked. Doing well  No new complaints  Physical Findings:  height is '5\' 2"'$  (1.575 m) and weight is 152 lb 11.2 oz (69.264 kg). Her temperature is 97.7 F (36.5 C). Her blood pressure is 114/101 and her pulse is 97.   Wt Readings from Last 3 Encounters:  03/20/15 152 lb 11.2 oz (69.264 kg)  03/15/15 152 lb 12.8 oz (69.31 kg)  03/07/15 150 lb 9.6 oz (68.312 kg)   NAD Ambulatory  Impression:  The patient is tolerating radiotherapy.  Plan:  Continue radiotherapy as planned.    ________________________________   [name], M.D.

## 2015-03-21 ENCOUNTER — Ambulatory Visit
Admission: RE | Admit: 2015-03-21 | Discharge: 2015-03-21 | Disposition: A | Discharge status: Home / Self Care | Payer: Medicare Other | Source: Ambulatory Visit | Attending: Radiation Oncology | Admitting: Radiation Oncology

## 2015-03-21 DIAGNOSIS — C7931 Secondary malignant neoplasm of brain: Secondary | ICD-10-CM | POA: Diagnosis not present

## 2015-03-22 ENCOUNTER — Ambulatory Visit
Admission: RE | Admit: 2015-03-22 | Discharge: 2015-03-22 | Disposition: A | Discharge status: Home / Self Care | Payer: Medicare Other | Source: Ambulatory Visit | Attending: Radiation Oncology | Admitting: Radiation Oncology

## 2015-03-22 DIAGNOSIS — C7931 Secondary malignant neoplasm of brain: Secondary | ICD-10-CM | POA: Diagnosis not present

## 2015-03-23 ENCOUNTER — Ambulatory Visit
Admission: RE | Admit: 2015-03-23 | Discharge: 2015-03-23 | Disposition: A | Discharge status: Home / Self Care | Payer: Medicare Other | Source: Ambulatory Visit | Attending: Radiation Oncology | Admitting: Radiation Oncology

## 2015-03-23 ENCOUNTER — Encounter: Payer: Self-pay | Admitting: *Deleted

## 2015-03-23 DIAGNOSIS — C7931 Secondary malignant neoplasm of brain: Secondary | ICD-10-CM | POA: Diagnosis not present

## 2015-03-24 ENCOUNTER — Ambulatory Visit
Admission: RE | Admit: 2015-03-24 | Discharge: 2015-03-24 | Disposition: A | Discharge status: Home / Self Care | Payer: Medicare Other | Source: Ambulatory Visit | Attending: Radiation Oncology | Admitting: Radiation Oncology

## 2015-03-24 DIAGNOSIS — C7931 Secondary malignant neoplasm of brain: Secondary | ICD-10-CM | POA: Diagnosis not present

## 2015-03-27 ENCOUNTER — Encounter: Payer: Self-pay | Admitting: Radiation Oncology

## 2015-03-27 ENCOUNTER — Ambulatory Visit
Admission: RE | Admit: 2015-03-27 | Discharge: 2015-03-27 | Disposition: A | Discharge status: Home / Self Care | Payer: Medicare Other | Source: Ambulatory Visit | Attending: Radiation Oncology | Admitting: Radiation Oncology

## 2015-03-27 VITALS — BP 114/63 | HR 96 | Ht 62.0 in | Wt 150.0 lb

## 2015-03-27 DIAGNOSIS — C7931 Secondary malignant neoplasm of brain: Secondary | ICD-10-CM

## 2015-03-27 NOTE — Progress Notes (Addendum)
Ms. Pryce presents for her 6 fraction of radiation to her whole brain for prevention. She reports she feels well. She denies nausea, dizziness, or fatigue. She does have a chronic cough which is not productive. She has no other concerns at this time.   BP 114/63 mmHg  Pulse 96  Ht '5\' 2"'$  (1.575 m)  Wt 150 lb (68.04 kg)  BMI 27.43 kg/m2  SpO2 100%   Wt Readings from Last 3 Encounters:  03/27/15 150 lb (68.04 kg)  03/20/15 152 lb 11.2 oz (69.264 kg)  03/15/15 152 lb 12.8 oz (69.31 kg)

## 2015-03-27 NOTE — Progress Notes (Signed)
   Weekly Management Note:  Outpatient    ICD-9-CM ICD-10-CM   1. Risk of brain metastases warranting prophylactic brain radiation 198.3 C79.31     Current Dose:  15 Gy  Projected Dose: 25 Gy   Narrative:  The patient presents for routine under treatment assessment.  CBCT/MVCT images/Port film x-rays were reviewed.  The chart was checked. Doing well - still, no new complaints  Physical Findings:  height is '5\' 2"'$  (1.575 m) and weight is 150 lb (68.04 kg). Her blood pressure is 114/63 and her pulse is 96. Her oxygen saturation is 100%.   Wt Readings from Last 3 Encounters:  03/27/15 150 lb (68.04 kg)  03/20/15 152 lb 11.2 oz (69.264 kg)  03/15/15 152 lb 12.8 oz (69.31 kg)   NAD Ambulatory No oral thrush No hair loss thus far  Impression:  The patient is tolerating radiotherapy.  Plan:  Continue radiotherapy as planned.   Card given for 1 month followup scheduling  ________________________________   [name], M.D.

## 2015-03-28 ENCOUNTER — Ambulatory Visit
Admission: RE | Admit: 2015-03-28 | Discharge: 2015-03-28 | Disposition: A | Discharge status: Home / Self Care | Payer: Medicare Other | Source: Ambulatory Visit | Attending: Radiation Oncology | Admitting: Radiation Oncology

## 2015-03-28 DIAGNOSIS — C7931 Secondary malignant neoplasm of brain: Secondary | ICD-10-CM | POA: Diagnosis not present

## 2015-03-29 ENCOUNTER — Ambulatory Visit
Admission: RE | Admit: 2015-03-29 | Discharge: 2015-03-29 | Disposition: A | Discharge status: Home / Self Care | Payer: Medicare Other | Source: Ambulatory Visit | Attending: Radiation Oncology | Admitting: Radiation Oncology

## 2015-03-29 DIAGNOSIS — C7931 Secondary malignant neoplasm of brain: Secondary | ICD-10-CM | POA: Diagnosis not present

## 2015-03-30 ENCOUNTER — Ambulatory Visit
Admission: RE | Admit: 2015-03-30 | Discharge: 2015-03-30 | Disposition: A | Discharge status: Home / Self Care | Payer: Medicare Other | Source: Ambulatory Visit | Attending: Radiation Oncology | Admitting: Radiation Oncology

## 2015-03-30 DIAGNOSIS — C7931 Secondary malignant neoplasm of brain: Secondary | ICD-10-CM | POA: Diagnosis not present

## 2015-03-31 ENCOUNTER — Ambulatory Visit
Admission: RE | Admit: 2015-03-31 | Discharge: 2015-03-31 | Disposition: A | Discharge status: Home / Self Care | Payer: Medicare Other | Source: Ambulatory Visit | Attending: Radiation Oncology | Admitting: Radiation Oncology

## 2015-03-31 ENCOUNTER — Encounter: Payer: Self-pay | Admitting: Radiation Oncology

## 2015-03-31 DIAGNOSIS — C7931 Secondary malignant neoplasm of brain: Secondary | ICD-10-CM | POA: Diagnosis not present

## 2015-04-06 ENCOUNTER — Other Ambulatory Visit: Payer: Self-pay | Admitting: Adult Health

## 2015-04-07 ENCOUNTER — Ambulatory Visit (HOSPITAL_BASED_OUTPATIENT_CLINIC_OR_DEPARTMENT_OTHER): Payer: Medicare Other

## 2015-04-07 VITALS — BP 115/75 | HR 100 | Temp 98.8°F

## 2015-04-07 DIAGNOSIS — C3490 Malignant neoplasm of unspecified part of unspecified bronchus or lung: Secondary | ICD-10-CM

## 2015-04-07 DIAGNOSIS — C3491 Malignant neoplasm of unspecified part of right bronchus or lung: Secondary | ICD-10-CM

## 2015-04-07 DIAGNOSIS — Z452 Encounter for adjustment and management of vascular access device: Secondary | ICD-10-CM | POA: Diagnosis not present

## 2015-04-07 MED ORDER — SODIUM CHLORIDE 0.9% FLUSH
10.0000 mL | INTRAVENOUS | Status: DC | PRN
  Administered 2015-04-07: 10 mL via INTRAVENOUS
  Filled 2015-04-07: qty 10

## 2015-04-07 MED ORDER — HEPARIN SOD (PORK) LOCK FLUSH 100 UNIT/ML IV SOLN
500.0000 [IU] | Freq: Once | INTRAVENOUS | Status: AC
  Administered 2015-04-07: 500 [IU] via INTRAVENOUS
  Filled 2015-04-07: qty 5

## 2015-04-14 NOTE — Progress Notes (Signed)
  Radiation Oncology         [phone] ________________________________  Name: Rachel Chapman MRN: [id number]  Date: 03/31/2015  DOB: 19-Nov-1940  End of Treatment Note  DIAGNOSIS:    ICD-9-CM ICD-10-CM   1. Risk of brain metastases warranting prophylactic brain radiation 198.3 C79.31        Indication for treatment:  Curative       Radiation treatment dates:  03/20/2015-03/31/2015  Site/dose:   Whole Brain treated to 25 Gy in 10 fractions   Beams/energy:   2 Field, 6X  Narrative: The patient tolerated radiation treatment relatively well.     Plan: The patient has completed radiation treatment. The patient will return to radiation oncology clinic for routine followup in one month. I advised them to call or return sooner if they have any questions or concerns related to their recovery or treatment.  -----------------------------------  [name], MD  This document serves as a record of services personally performed by [name], MD. It was created on her behalf by Derek Mound, a trained medical scribe. The creation of this record is based on the scribe's personal observations and the provider's statements to them. This document has been checked and approved by the attending provider.

## 2015-04-28 ENCOUNTER — Encounter: Payer: Self-pay | Admitting: Radiation Oncology

## 2015-04-28 ENCOUNTER — Encounter: Payer: Self-pay | Admitting: Adult Health

## 2015-04-28 ENCOUNTER — Ambulatory Visit
Admission: RE | Admit: 2015-04-28 | Discharge: 2015-04-28 | Disposition: A | Discharge status: Home / Self Care | Payer: Medicare Other | Source: Ambulatory Visit | Attending: Radiation Oncology | Admitting: Radiation Oncology

## 2015-04-28 VITALS — BP 129/83 | HR 91 | Temp 97.4°F | Wt 149.8 lb

## 2015-04-28 DIAGNOSIS — C7931 Secondary malignant neoplasm of brain: Secondary | ICD-10-CM

## 2015-04-28 NOTE — Progress Notes (Signed)
I briefly met with Rachel Chapman, her son, and her daughter-in-law prior to her routine follow-up visit with Dr. Isidore Moos.  I explained the purpose of the survivorship program and my role in her care in the future, which could include management of late/long-term side effects of treatment, as well as providing her with a survivorship care plan document.  Rachel Chapman agreed to have me mail a copy of her survivorship care plan document to her home for she and her family to review.  I offered to bring them in for a visit in the future or we could discuss any questions they may have via telephone as well.  I will prepare and mail them her care plan, along with several other survivorship and cancer center resources and will get it mailed to her in the next couple of weeks.  Today, I gave her a copy of her "Life After Cancer for Every Survivor" booklet, along with my business card and encouraged her to contact me, as needed.  The patient and her family voiced appreciation for my visit.  I will be in touch with them and provide support in the future as needed.   Mike Craze, NP [place] [phone]

## 2015-04-28 NOTE — Progress Notes (Signed)
Ms. Filosa is here for follow up of radiation to her whole brain that completed 03/31/15. She denies pain, nausea or vomiting. She denies dizziness. She reports she is eating well. She has no concerns at this time.   BP 129/83 mmHg  Pulse 91  Temp(Src) 97.4 F (36.3 C)  Wt 149 lb 12.8 oz (67.949 kg)  SpO2 100%   Wt Readings from Last 3 Encounters:  04/28/15 149 lb 12.8 oz (67.949 kg)  03/27/15 150 lb (68.04 kg)  03/20/15 152 lb 11.2 oz (69.264 kg)

## 2015-04-28 NOTE — Progress Notes (Signed)
Radiation Oncology         [phone] ________________________________  Name: Rachel Chapman MRN: [id number]  Date: 04/28/2015  DOB: February 08, 1941  Follow-Up Visit Note  Outpatient  CC: Rachel Peng, NP  Rachel Genera, MD  Diagnosis:      ICD-9-CM ICD-10-CM   1. Risk of brain metastases warranting prophylactic brain radiation 198.3 C79.31              Interval Since Most Recent Radiation:  1  months  Indication for treatment: Curative  Radiation treatment dates: 03/20/2015-03/31/2015  Site/dose: Whole Brain treated to 25 Gy in 10 fractions   Narrative:  The patient returns today for routine follow-up.  [name] is here for follow up of radiation to her whole brain that completed 03/31/15. She denies pain, nausea or vomiting. She denies dizziness. She reports she is eating well. Reports mild fatigue that is not any worse than previous. Some days she is more fatigued than others. Overall, her family feels she is doing fairly well. Her scalp is itchy and dry. She has been applying aloe vera to manage this. She has no concerns at this time.    ALLERGIES:  has No Known Allergies.  Meds: Current Outpatient Prescriptions  Medication Sig Dispense Refill  . albuterol (PROVENTIL HFA;VENTOLIN HFA) 108 (90 BASE) MCG/ACT inhaler Inhale 2 puffs into the lungs every 6 (six) hours as needed for shortness of breath. 3.7 g 3  . cholecalciferol (VITAMIN D) 1000 UNITS tablet Take 1 tablet (1,000 Units total) by mouth daily. 90 tablet 3  . citalopram (CELEXA) 10 MG tablet Take 1 tablet (10 mg total) by mouth daily. 30 tablet 3  . glucose blood (ONETOUCH VERIO) test strip Test once daily. (Patient taking differently: 1 each by Other route. One to two times daily,) 100 each 5  . JANUVIA 100 MG tablet TAKE ONE TABLET BY MOUTH ONCE DAILY 30 tablet 0  . levothyroxine (SYNTHROID, LEVOTHROID) 112 MCG tablet Take 1 tablet (112 mcg total) by mouth 2 (two) times daily. 90  tablet 3  . lidocaine-prilocaine (EMLA) cream Apply topically once. 30 each 2  . LORazepam (ATIVAN) 0.5 MG tablet Take 1 tablet (0.5 mg total) by mouth every 8 (eight) hours as needed for anxiety (or nausea). Could take 2 tab 30-45 mins prior to PET/CT and MRI Brain 50 tablet 0  . mirtazapine (REMERON) 15 MG tablet TAKE ONE-HALF TABLET (7.5 MG TOTAL) BY MOUTH AT BEDTIME 30 tablet 6  . ondansetron (ZOFRAN-ODT) 4 MG disintegrating tablet Take 4 mg by mouth every 8 (eight) hours as needed for nausea or vomiting. Reported on 03/15/2015    . simvastatin (ZOCOR) 20 MG tablet Take 1 tablet (20 mg total) by mouth at bedtime. 90 tablet 3  . vitamin B-12 (CYANOCOBALAMIN) 1000 MCG tablet Take 1 tablet (1,000 mcg total) by mouth daily. 90 tablet 3  . lisinopril (PRINIVIL,ZESTRIL) 10 MG tablet Take 1 tablet (10 mg total) by mouth daily. (Patient not taking: Reported on 04/28/2015) 90 tablet 1  . ONETOUCH DELICA LANCETS FINE MISC Test once daily. (Patient not taking: Reported on 03/27/2015) 100 each 5  . oxyCODONE (ROXICODONE) 5 MG/5ML solution Take 5-10 mLs (5-10 mg total) by mouth every 4 (four) hours as needed for moderate pain, severe pain or breakthrough pain. (Patient not taking: Reported on 03/15/2015) 473 mL 0  . polyethylene glycol (MIRALAX) packet Take 17 g by mouth daily. (Patient not taking: Reported on 03/15/2015) 30 each 1  . senna-docusate (SENNA S)  8.6-50 MG tablet Take 2 tablets by mouth at bedtime as needed for mild constipation. (Patient not taking: Reported on 03/15/2015) 60 tablet 1   No current facility-administered medications for this encounter.   Facility-Administered Medications Ordered in Other Encounters  Medication Dose Route Frequency Provider Last Rate Last Dose  . 0.9 %  sodium chloride infusion   Intravenous Once [name], MD      . sodium chloride 0.9 % injection 10 mL  10 mL Intracatheter PRN Rachel Genera, MD   10 mL at 11/11/14 1131  . sodium chloride 0.9 % injection 10 mL   10 mL Intravenous PRN Rachel Genera, MD   10 mL at 01/18/15 1527    Physical Findings: The patient is in no acute distress. Patient is alert and oriented.  weight is 149 lb 12.8 oz (67.949 kg). Her temperature is 97.4 F (36.3 C). Her blood pressure is 129/83 and her pulse is 91. Her oxygen saturation is 100%. .  No significant changes. HENT: Oral cavity is clear without thrush. Skin: Dry, flaky skin over the scalp.  Lab Findings: Lab Results  Component Value Date   WBC 5.8 03/07/2015   HGB 9.2* 03/07/2015   HCT 27.7* 03/07/2015   MCV 96.9 03/07/2015   PLT 163 03/07/2015       Radiographic Findings: No results found.  Impression/Plan:  I have encouraged the patient to use Vitamin E lotion on her scalp to manage the current skin irritation.   I will continue to defer to medical oncology for systemic imaging and follow ups related to her systemic imaging.   We will arrange a follow up Brain MRI in 2 months and I will see her soon after.   _____________________________________   [name], MD   This document serves as a record of services personally performed by [name], MD. It was created on her behalf by [name], a trained medical scribe. The creation of this record is based on the scribe's personal observations and the provider's statements to them. This document has been checked and approved by the attending provider.

## 2015-05-05 ENCOUNTER — Other Ambulatory Visit (HOSPITAL_BASED_OUTPATIENT_CLINIC_OR_DEPARTMENT_OTHER): Payer: Medicare Other

## 2015-05-05 ENCOUNTER — Ambulatory Visit (HOSPITAL_BASED_OUTPATIENT_CLINIC_OR_DEPARTMENT_OTHER): Payer: Medicare Other

## 2015-05-05 ENCOUNTER — Ambulatory Visit (HOSPITAL_BASED_OUTPATIENT_CLINIC_OR_DEPARTMENT_OTHER): Payer: Medicare Other | Admitting: Hematology

## 2015-05-05 ENCOUNTER — Other Ambulatory Visit: Payer: Self-pay | Admitting: *Deleted

## 2015-05-05 ENCOUNTER — Encounter: Payer: Self-pay | Admitting: Hematology

## 2015-05-05 ENCOUNTER — Telehealth: Payer: Self-pay | Admitting: Hematology

## 2015-05-05 VITALS — BP 109/65 | HR 104 | Temp 98.2°F | Resp 18 | Ht 62.0 in | Wt 149.0 lb

## 2015-05-05 DIAGNOSIS — C3490 Malignant neoplasm of unspecified part of unspecified bronchus or lung: Secondary | ICD-10-CM

## 2015-05-05 DIAGNOSIS — E039 Hypothyroidism, unspecified: Secondary | ICD-10-CM

## 2015-05-05 DIAGNOSIS — C7931 Secondary malignant neoplasm of brain: Secondary | ICD-10-CM | POA: Diagnosis not present

## 2015-05-05 DIAGNOSIS — D6959 Other secondary thrombocytopenia: Secondary | ICD-10-CM | POA: Diagnosis not present

## 2015-05-05 DIAGNOSIS — Z452 Encounter for adjustment and management of vascular access device: Secondary | ICD-10-CM | POA: Diagnosis not present

## 2015-05-05 DIAGNOSIS — C3491 Malignant neoplasm of unspecified part of right bronchus or lung: Secondary | ICD-10-CM

## 2015-05-05 DIAGNOSIS — Z95828 Presence of other vascular implants and grafts: Secondary | ICD-10-CM

## 2015-05-05 DIAGNOSIS — D6481 Anemia due to antineoplastic chemotherapy: Secondary | ICD-10-CM

## 2015-05-05 DIAGNOSIS — E119 Type 2 diabetes mellitus without complications: Secondary | ICD-10-CM | POA: Diagnosis not present

## 2015-05-05 DIAGNOSIS — C781 Secondary malignant neoplasm of mediastinum: Secondary | ICD-10-CM

## 2015-05-05 DIAGNOSIS — F419 Anxiety disorder, unspecified: Secondary | ICD-10-CM

## 2015-05-05 DIAGNOSIS — Z760 Encounter for issue of repeat prescription: Secondary | ICD-10-CM

## 2015-05-05 LAB — CBC & DIFF AND RETIC
BASO%: 0.3 % (ref 0.0–2.0)
BASOS ABS: 0 10*3/uL (ref 0.0–0.1)
EOS%: 2.7 % (ref 0.0–7.0)
Eosinophils Absolute: 0.2 10*3/uL (ref 0.0–0.5)
HEMATOCRIT: 30.1 % — AB (ref 34.8–46.6)
HGB: 10.2 g/dL — ABNORMAL LOW (ref 11.6–15.9)
Immature Retic Fract: 5.8 % (ref 1.60–10.00)
LYMPH%: 17.3 % (ref 14.0–49.7)
MCH: 31.5 pg (ref 25.1–34.0)
MCHC: 33.9 g/dL (ref 31.5–36.0)
MCV: 92.9 fL (ref 79.5–101.0)
MONO#: 0.8 10*3/uL (ref 0.1–0.9)
MONO%: 13.1 % (ref 0.0–14.0)
NEUT#: 3.9 10*3/uL (ref 1.5–6.5)
NEUT%: 66.6 % (ref 38.4–76.8)
PLATELETS: 167 10*3/uL (ref 145–400)
RBC: 3.24 10*6/uL — ABNORMAL LOW (ref 3.70–5.45)
RDW: 13.8 % (ref 11.2–14.5)
Retic %: 1.79 % (ref 0.70–2.10)
Retic Ct Abs: 58 10*3/uL (ref 33.70–90.70)
WBC: 5.9 10*3/uL (ref 3.9–10.3)
lymph#: 1 10*3/uL (ref 0.9–3.3)

## 2015-05-05 LAB — COMPREHENSIVE METABOLIC PANEL
ALT: 17 U/L (ref 0–55)
ANION GAP: 8 meq/L (ref 3–11)
AST: 16 U/L (ref 5–34)
Albumin: 3 g/dL — ABNORMAL LOW (ref 3.5–5.0)
Alkaline Phosphatase: 86 U/L (ref 40–150)
BUN: 23.6 mg/dL (ref 7.0–26.0)
CALCIUM: 9.2 mg/dL (ref 8.4–10.4)
CHLORIDE: 106 meq/L (ref 98–109)
CO2: 25 mEq/L (ref 22–29)
Creatinine: 1.2 mg/dL — ABNORMAL HIGH (ref 0.6–1.1)
EGFR: 43 mL/min/{1.73_m2} — AB (ref >90)
Glucose: 151 mg/dl — ABNORMAL HIGH (ref 70–140)
POTASSIUM: 4.9 meq/L (ref 3.5–5.1)
Sodium: 139 mEq/L (ref 136–145)
Total Bilirubin: <0.3 mg/dL (ref 0.20–1.20)
Total Protein: 6.7 g/dL (ref 6.4–8.3)

## 2015-05-05 LAB — LACTATE DEHYDROGENASE: LDH: 192 U/L (ref 125–245)

## 2015-05-05 MED ORDER — SODIUM CHLORIDE 0.9% FLUSH
10.0000 mL | INTRAVENOUS | Status: DC | PRN
  Administered 2015-05-05: 10 mL via INTRAVENOUS
  Filled 2015-05-05: qty 10

## 2015-05-05 MED ORDER — LORAZEPAM 0.5 MG PO TABS: 0.5000 mg | ORAL_TABLET | Freq: Three times a day (TID) | ORAL | Status: DC | PRN

## 2015-05-05 MED ORDER — HEPARIN SOD (PORK) LOCK FLUSH 100 UNIT/ML IV SOLN
500.0000 [IU] | Freq: Once | INTRAVENOUS | Status: AC
  Administered 2015-05-05: 500 [IU] via INTRAVENOUS
  Filled 2015-05-05: qty 5

## 2015-05-05 MED ORDER — LIDOCAINE-PRILOCAINE 2.5-2.5 % EX KIT: PACK | Freq: Once | CUTANEOUS | Status: DC

## 2015-05-05 NOTE — Telephone Encounter (Signed)
Gave and printed appt sched and avs fo rpt for May °

## 2015-05-05 NOTE — Patient Instructions (Signed)

## 2015-05-08 NOTE — Progress Notes (Signed)
Marland Kitchen  HEMATOLOGY ONCOLOGY PROGRESS NOTE  Date of service: .05/05/2015  Patient Care Team: [name], NP as PCP - General (Family Medicine)  Diagnosis: Limited stage small cell lung cancer  Current Treatment: monitoring  Completed treatment  Status post 5 cycles of carboplatin and etoposide Status post Concurrent radiation therapy completed 12/16/2014.  Prophylactic cranial radiation therapy  INTERVAL HISTORY:  Patient is here for her scheduled follow-up in clinic after completion of her planned prophylactic cranial irradiation.  Patient notes she tolerated this well with no acute new concerns.  Lost some of her hair but it is growing back. Notes no new chest pain or shortness of breath.  Notes that her daughter was trying to lift her up from behind and she noted some left lower chest wall discomfort probably from a strained muscle.  This is getting better. No acute new focal neurological symptoms.  No other overt focal symptoms. She has been eating well.  Family notes that she has been somewhat distant.  Patient notes that working with other people gives her Caryl Asp.  I gave her some information regarding volunteer opportunities with our clinic.  REVIEW OF SYSTEMS:    10 Point review of systems of done and is negative except as noted above.  . Past Medical History  Diagnosis Date  . Hypertension   . Dyslipidemia   . Diabetes ([place])   . Hypothyroidism   . Pneumonia 2007  . Smoker     1.5 packs per day for 60 years.  Quit one month ago  . Anxiety   . Cancer ([place])   . Lung cancer ([place]) 8/216    hilar/mediastinum lung scca    . Past Surgical History  Procedure Laterality Date  . Cesarean section      . Social History  Substance Use Topics  . Smoking status: Former Smoker -- 1.50 packs/day for 60 years    Quit date: 09/12/2014  . Smokeless tobacco: None  . Alcohol Use: No    ALLERGIES:  has No Known Allergies.  MEDICATIONS:  Current Outpatient Prescriptions    Medication Sig Dispense Refill  . albuterol (PROVENTIL HFA;VENTOLIN HFA) 108 (90 BASE) MCG/ACT inhaler Inhale 2 puffs into the lungs every 6 (six) hours as needed for shortness of breath. 3.7 g 3  . cholecalciferol (VITAMIN D) 1000 UNITS tablet Take 1 tablet (1,000 Units total) by mouth daily. 90 tablet 3  . citalopram (CELEXA) 10 MG tablet Take 1 tablet (10 mg total) by mouth daily. 30 tablet 3  . glucose blood (ONETOUCH VERIO) test strip Test once daily. (Patient taking differently: 1 each by Other route. One to two times daily,) 100 each 5  . JANUVIA 100 MG tablet TAKE ONE TABLET BY MOUTH ONCE DAILY 30 tablet 0  . levothyroxine (SYNTHROID, LEVOTHROID) 112 MCG tablet Take 1 tablet (112 mcg total) by mouth 2 (two) times daily. 90 tablet 3  . lidocaine-prilocaine (EMLA) cream Apply topically once. 30 each 2  . lisinopril (PRINIVIL,ZESTRIL) 10 MG tablet Take 1 tablet (10 mg total) by mouth daily. (Patient not taking: Reported on 04/28/2015) 90 tablet 1  . LORazepam (ATIVAN) 0.5 MG tablet Take 1 tablet (0.5 mg total) by mouth every 8 (eight) hours as needed for anxiety (or nausea). Could take 2 tab 30-45 mins prior to PET/CT and MRI Brain 50 tablet 0  . mirtazapine (REMERON) 15 MG tablet TAKE ONE-HALF TABLET (7.5 MG TOTAL) BY MOUTH AT BEDTIME 30 tablet 6  . ondansetron (ZOFRAN-ODT) 4 MG disintegrating tablet  Take 4 mg by mouth every 8 (eight) hours as needed for nausea or vomiting. Reported on 03/15/2015    . ONETOUCH DELICA LANCETS FINE MISC Test once daily. (Patient not taking: Reported on 03/27/2015) 100 each 5  . oxyCODONE (ROXICODONE) 5 MG/5ML solution Take 5-10 mLs (5-10 mg total) by mouth every 4 (four) hours as needed for moderate pain, severe pain or breakthrough pain. (Patient not taking: Reported on 03/15/2015) 473 mL 0  . polyethylene glycol (MIRALAX) packet Take 17 g by mouth daily. (Patient not taking: Reported on 03/15/2015) 30 each 1  . senna-docusate (SENNA S) 8.6-50 MG tablet Take 2 tablets  by mouth at bedtime as needed for mild constipation. (Patient not taking: Reported on 03/15/2015) 60 tablet 1  . simvastatin (ZOCOR) 20 MG tablet Take 1 tablet (20 mg total) by mouth at bedtime. 90 tablet 3  . vitamin B-12 (CYANOCOBALAMIN) 1000 MCG tablet Take 1 tablet (1,000 mcg total) by mouth daily. 90 tablet 3   No current facility-administered medications for this visit.   Facility-Administered Medications Ordered in Other Visits  Medication Dose Route Frequency Provider Last Rate Last Dose  . 0.9 %  sodium chloride infusion   Intravenous Once [name], MD      . sodium chloride 0.9 % injection 10 mL  10 mL Intracatheter PRN [name], MD   10 mL at 11/11/14 1131  . sodium chloride 0.9 % injection 10 mL  10 mL Intravenous PRN [name], MD   10 mL at 01/18/15 1527    PHYSICAL EXAMINATION: ECOG PERFORMANCE STATUS: 1 - Symptomatic but completely ambulatory  . Filed Vitals:   05/05/15 0845  BP: 109/65  Pulse: 104  Temp: 98.2 F (36.8 C)  Resp: 18    Filed Weights   05/05/15 0845  Weight: 149 lb (67.586 kg)   .Body mass index is 27.25 kg/(m^2).  GENERAL:alert, in no acute distress and comfortable SKIN: skin color, texture, turgor are normal, no rashes or significant lesions EYES: normal, conjunctiva are pink and non-injected, sclera clear OROPHARYNX:no exudate, no erythema and lips, buccal mucosa, and tongue normal  NECK: supple, no JVD, thyroid normal size, non-tender, without nodularity LYMPH:  no palpable lymphadenopathy in the cervical, axillary or inguinal LUNGS: clear to auscultation with normal respiratory effort HEART: regular rate & rhythm,  no murmurs and no lower extremity edema ABDOMEN: abdomen soft, non-tender, normoactive bowel sounds  Musculoskeletal: no cyanosis of digits and no clubbing  PSYCH: alert & oriented x 3 with fluent speech NEURO: no focal motor/sensory deficits  LABORATORY DATA:   I have reviewed the data as  listed  . CBC Latest Ref Rng 05/05/2015 03/07/2015 02/15/2015  WBC 3.9 - 10.3 10e3/uL 5.9 5.8 2.3(L)  Hemoglobin 11.6 - 15.9 g/dL 10.2(L) 9.2(L) 8.3(L)  Hematocrit 34.8 - 46.6 % 30.1(L) 27.7(L) 25.4(L)  Platelets 145 - 400 10e3/uL 167 163 117(L)  . CMP Latest Ref Rng 05/05/2015 03/07/2015 02/15/2015  Glucose 70 - 140 mg/dl 151(H) 109 152(H)  BUN 7.0 - 26.0 mg/dL 23.6 19.9 21.2  Creatinine 0.6 - 1.1 mg/dL 1.2(H) 1.1 1.3(H)  Sodium 136 - 145 mEq/L 139 140 139  Potassium 3.5 - 5.1 mEq/L 4.9 4.3 4.4  CO2 22 - 29 mEq/L '25 23 25  '$ Calcium 8.4 - 10.4 mg/dL 9.2 8.7 8.5  Total Protein 6.4 - 8.3 g/dL 6.7 6.6 6.5  Total Bilirubin 0.20 - 1.20 mg/dL <0.30 0.36 0.33  Alkaline Phos 40 - 150 U/L 86 88 90  AST  5 - 34 U/L '16 27 24  '$ ALT 0 - 55 U/L 17 34 35      RADIOGRAPHIC STUDIES: I have personally reviewed the radiological images as listed and agreed with the findings in the report. No results found.  ASSESSMENT & PLAN:   75 year old female with history of heavy smoking with  #1 Limited stage Small cell lung cancer. CT scan on 09/26/2014 at the outside hospital showed mediastinal adenopathy which was bronchoscopically biopsied and noted to be consistent with small cell lung cancer. No overt focal neurological deficits or symptoms suggestive of brain metastases at this time. Patient is status post 5 cycles of carboplatin etoposide with concurrent radiation therapy. She was noted to have good response on an interim PET CT scan on 11/29/2014. PET CT scan after completion of 5 cycles of carboplatin and etoposide chemotherapy with concurrent radiation showed resolution of her lung mass.  MRI of the brain shows no evidence of metastatic disease at this time.  Patient has completed prophylactic cranial irradiation.  Currently has no clinical features suggestive of disease progression. #2 normocytic normochromic anemia related to chemotherapy resolving  Thrombocytopenia related to chemotherapy- Resolved #3  alopecia related to chemotherapy and RT.  Improving #4 radiation esophagitis-resolved  #5 protein calorie malnutrition and weight loss due to small cell lung cancer. Much improved. #6 ex-smoker 90-pack-year history of smoking at one month ago. Continues to stay off her cigarettes. #9 COPD -- has never been diagnosed as such. He has been recently given an albuterol inhaler as needed. Not oxygen dependent. #10 hypothyroidism -continue with levothyroxine replacement #11 Diabetes patient has been controlled with oral hypoglycemics.  #12 severe anxiety.Patient appears much improved on Celexa and when necessary lorazepam. Plan  -We'll restage with CT chest abdomen pelvis and MRI of the brain (3T) in about one month. -no indication for additional treatment at this time. -Counseled the patient on continued smoking cessation. -No significant remaining toxicities from chemotherapy at this time. -encouraged  Healthy diet and exercise. -I encouraged her to get more involved with other activities since her family is concerned she might be getting socially isolated.  Given her information about possibly pursuing volunteer opportunities at our cancer center.  . Orders Placed This Encounter  Procedures  . CT Chest W Contrast    Standing Status: Future     Number of Occurrences:      Standing Expiration Date: 07/04/2016    Order Specific Question:  Reason for Exam (SYMPTOM  OR DIAGNOSIS REQUIRED)    Answer:  restaging of small cell lung cancer    Order Specific Question:  Preferred imaging location?    Answer:  [facility]  . CT Abdomen Pelvis W Contrast    Standing Status: Future     Number of Occurrences:      Standing Expiration Date: 06/08/2016    Order Specific Question:  Reason for exam:    Answer:  restaging of small cell lung cancer    Order Specific Question:  Preferred imaging location?    Answer:  [facility]  . MR Brain W Wo Contrast    Standing Status: Future      Number of Occurrences:      Standing Expiration Date: 05/04/2016    Order Specific Question:  If indicated for the ordered procedure, I authorize the administration of contrast media per Radiology protocol    Answer:  Yes    Order Specific Question:  Reason for Exam (SYMPTOM  OR DIAGNOSIS REQUIRED)  Answer:  small cell lung cancer restaging (3T scan)    Order Specific Question:  Preferred imaging location?    Answer:  [facility] (table limit-350 lbs)    Order Specific Question:  What is the patient's sedation requirement?    Answer:  No Sedation    Order Specific Question:  Does the patient have a pacemaker or implanted devices?    Answer:  No  . CBC & Diff and Retic    Standing Status: Future     Number of Occurrences:      Standing Expiration Date: 06/08/2016  . Comprehensive metabolic panel    Standing Status: Future     Number of Occurrences:      Standing Expiration Date: 05/04/2016  . Lactate dehydrogenase (LDH)    Standing Status: Future     Number of Occurrences:      Standing Expiration Date: 05/04/2016    RTC with Dr Irene Limbo in 1 months with cbc, cmp and restaging imaging  I spent 20 minutes counseling the patient face to face. The total time spent in the appointment was 25 minutes and more than 50% was on counseling and direct patient cares.    Sullivan Lone MD [place] AAHIVMS [facility] Hematology/Oncology Physician [facility]  (Office):       [phone] (Work cell):  [phone] (Fax):           [phone]

## 2015-05-10 ENCOUNTER — Encounter: Payer: Self-pay | Admitting: Adult Health

## 2015-05-10 ENCOUNTER — Other Ambulatory Visit: Payer: Self-pay | Admitting: Adult Health

## 2015-05-10 NOTE — Progress Notes (Unsigned)
Survivorship Care Plan and additional survivorship resources mailed to the patient today.  I have also forwarded a copy of the care plan to the patient's PCP, [name], NP  I encouraged the patient to reach out to me with any questions or concerns and I would be happy to see her in the survivorship clinic at any time in the future, as needed.   Mike Craze, NP [place] [phone]

## 2015-05-10 NOTE — Progress Notes (Signed)
Lung Cancer Treatment Summary & Survivorship Care Plan Provided by Mike Craze, NP on 05/10/2015   General Information  Patient Name Rachel Chapman   Patient ID [id number]   Date of Birth Jan 15, 1941     Your Care Team  Patient Care Team: [name], NP as PCP - General (Family Medicine) Fabienne Bruns, MD - Medical Oncologist [name], MD - Radiation Oncologist Mike Craze, NP - Survivorship Nurse Practitioner   To reach your [facility] providers, call [phone].   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 Clotting disorder Neg Hx   . Cancer Mother     lung non smoker  . Cancer Sister     colon  . Diabetes Sister   . Diabetes Mother     Smoking Status Former smoker, quit in 09/2014        Treatment Summary    Small cell carcinoma of right lung [facility])   09/26/2014 Imaging CT Chest [facility]): Mediastinal lymphadenopathy indicative of neoplasm. 9 mm x 7 mm right apical nodule. Apical pleural-based thickening, left-sided chronic in nature.    10/04/2014 Initial Biopsy Subcarinal lymph node biopsy x 2 both revealed small cell carcinoma [facility]).    10/04/2014 Initial Diagnosis Small cell carcinoma of right lung ([place])   10/19/2014 Imaging PET: Intensely hypermetabolic (R) lung mass compatible with primary SCLC. (+) evidence of involement of (R) mainstem bronchus w/ invasion of mediastinum including sub-carinal region. Hypermetabolic sub-carinal and (R) paratracheal LN mets. 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 No acute intracranial findings. No enhancing intracranial mass lesion.    10/19/2014 - 01/27/2015 Chemotherapy Carbo/Etoposide Day 1, Etoposide Days 1-3 completed x 5 cycles ([place]). Note: Cycle #4 delayed d/t side effects of concurrent chemoradiation. Cycle #5 delayed for decreased bld counts. Cycle #6 held d/t toxicities & pt wishes.    11/07/2014 - 12/16/2014 Radiation Therapy IMRT Isidore Moos).  Right lung: Total dose 60 Gy in 30 fractions.    11/29/2014 Imaging Restaging PET: Interval near complete metabolic response to therapy. Residual soft tissue in (R) paratracheal & sub-carinal region exhibits nonspecifc low level uptake. Increased uptake in mid-thoracic esophagus favored to be treatment changes.    12/12/2014 - 12/14/2014 Hospital Admission Admitted for hypotension, dehydration, hyponatremia, N&V, and pancytopenia secondary to chemo.    03/06/2015 Imaging MRI Brain: No evidence of metastatic disease to brain or meninges. Stable 5 mm extra-axial lesion adjacent to left parietal lob, likely calcified. 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 No significant associated enhancement.    03/06/2015 Imaging Restaging PET: Previously seen hypermetabolic (R) paratracheal & subcarinal soft tissue no longer shows abnormal uptake. Minimal residual hypermetabolism in mid-esophagus, possible tx related.  Trace right pleural effusion.    03/20/2015 - 03/31/2015 Radiation Therapy Prophylactic cranial irradiation Isidore Moos). Whole brain: Total dose 25 Gy in 10 fractions.         Treatment Goal Curative        Your cancer treatment included concurrent (given together) chemotherapy & radiation.  Chemotherapy: Chemotherapy Given with Radiation?  Yes   Drugs Received Date Started Date Completed # cycles (number of times drug was received) Comments  [x]   Carboplatin (Carbo)  10/19/14  01/25/15 5 Cycle #4 & Cycle #5 delayed due  to side effects. Cycle #6 held due to side effects and patient wishes.  [x]   Etoposide  10/19/14  01/27/15 5 cycles, 14 doses of Etoposide Received 14 of 15 scheduled doses per patient wishes/scheduling difficulties.       *You received Carboplatin on Day 1 of every chemo cycle.  You received Etoposide on Days 1, 2, & 3 of every chemo cycle. This is why you received more doses of the Etoposide than the Carboplatin.  During Cycle #5, you received the Etoposide on Days 1 and 3.     Side Effects of Chemotherapy/Biotherapy  Possible Long-Term: Fatigue Neuropathy Cognitive dysfunction (changes in memory or concentration) Heart failure Kidney failure Infertility Liver problems  Possible Late-Term (five or more years after treatment): Cataracts Infertility Liver problems Lung disease Osteoporosis Reduced lung capacity Secondary primary cancer                Radiation Therapy: Type of Radiation Intensity Modulated Radiation Therapy (IMRT)  Location of Radiation Right lung  Radiation Period Start Date: 11/07/14                End Date: 12/16/14  Number of Radiation Treatments 30 treatments  Total Radiation Dose Right lung: 60 Gy  Prophylactic Cranial/Whole Brain Radiation Completed Yes  Dates of Prophylactic Cranial/Whole Brain Radiation Treatments Start Date: 03/20/15                  End Date: 03/31/15  Number of Radiation Treatments 10 treatments   Total Radiation Dose Whole Brain: 25 Gy    Side Effects of Radiation  Possible Long-Term Fatigue Skin irritation/discoloration Hair loss in treated areas  Possible Late-Term (five or more years after treatment)  Skin changes (including discoloration to the treated area) Lung damage/irritation Formation of scar tissue Rib damage (rare) Heart irritation or damage (for left-sided tumors) Damage to any normal tissues in the irradiated field Secondary cancers (rare) New primary cancers (rare)   Whether you experience late  side effects will depend on: The part of your body that was treated The dose and length of your radiation therapy And if you received chemotherapy before, during, or after radiation therapy .                                Survivorship Care & Follow-up Schedule   Becoming a cancer survivor is a time of great celebration, but also a time of many questions and concerns.  The following information and resources are for you, your caregivers, and your primary care doctor to better understand the needs of a cancer survivor.     How often will I see my cancer providers: Survivor Years 0-2  (Survivor Years defined by length of time since cancer diagnosis)  When?  Responsible Provider  Medical Oncology visits Every 6 months Dr. Irene Limbo  Radiation Oncology visits Every 6 months Dr. Isidore Moos  CT scan of chest Every 6 months Dr. Irene Limbo or Dr. Isidore Moos   Survivorship Nurse Practitioner (NP) Approximately 21-monthafter treatment completed GMike Craze NP   How often will I see my cancer providers: Survivor Years 3-5+ (Survivor Years defined by length of time since cancer diagnosis)  When?  Responsible Provider  Medical Oncology visits Annually  Dr. Irene Limbo (or you may see Dr. Isidore Moos once per year)  Radiation Oncology visits Annually Dr. Isidore Moos (or you may see Dr. Irene Limbo once per year)  CT scan of chest  Annually Dr. Irene Limbo, Dr. Isidore Moos, or Mike Craze, NP  Survivorship Nurse Practitioner (NP) Annually when your doctor refers you to survivorship as a long-term survivor! Mike Craze, NP                    Screening Recommendations Get regular screenings, as indicated by your Primary Care Provider   Frequency Responsible Provider  Annual Physical By your Primary Care Provider Should include skin examination Annually Discuss with [name], NP  Colonoscopy Beginning at age 20 unless clinically indicated to begin sooner Every 10 Years Discuss with Core  Nafziger, NP  Pap Smear (Women) Frequency to be discussed and determined by Primary Care Provider or Gynecology Specialist Discuss with [name], NP Discuss with [name], NP or Gynecology Specialist  Vaccinations (Flu, Shingles, Pneumonia, TDaP, etc.) Discuss with [name], NP Discuss with [name], NP      Common Complaints/Concerns of Cancer Survivors  *Patients often worry if what they are experiencing is normal, or to be expected, given their cancer history and treatment.  Below are common complaints & concerns reported by cancer survivors. If you are concerned about symptoms you are experiencing, please report all symptoms to your cancer care team!   Common Complaints/Concerns for Lung Cancer Survivors   Fatigue  Dry cough  Memory problems and/or confusion  Depression  Anxiety  Weight changes  Insomnia or trouble sleeping  Decreased range of motion (difficulty moving neck)  Intimacy and sexuality  Marital/partner/family relationships   Employment, health insurance concerns, and/or finances  Concerns regarding spirituality  Exercise after cancer treatments  Maintaining a tobacco-free lifestyle          Symptoms to Watch For and Report to Your Provider   *Often cancer survivors are fearful about their cancer coming back or being diagnosed with a new cancer.  Below are symptoms that you and your loved ones should watch for and report to your provider.   General Symptoms to Watch for and Report to Your Provider .  Return of the cancer symptoms you had before- such as a lump or new growth where your cancer first started . New or unusual pain that seems unrelated to an injury and does not go away, including back pain or bone pain . Weight loss without trying/intending . Unexplained bleeding . A rash or allergic reaction, such as swelling, severe itching or wheezing . Chills or fevers . Persistent headaches . Shortness of breath or  difficulty breathing . Bloody stools or blood in your urine . Lumps, bumps, swelling and/or nipple discharge . Nausea, vomiting, diarrhea, loss of appetite, or trouble swallowing . A cough that doesn't go away . Abdominal pain . Swelling in your arms or legs . Fractures . Any other signs mentioned by your doctor or nurse or any unusual symptoms                 that you just can't explain   NOTE: Just because you have certain symptoms, it doesn't mean the cancer has come back or you have a new cancer. Symptoms can be due to other problems that need to be addressed.  It is important to watch for these symptoms and report them to your provider so you can be  medically evaluated for any of these concerns!                     What Now?  Thriving & Surviving In Your Life After Cancer   Stop smoking or continue to abstain from tobacco use. If you need help with smoking cessation, talk to your healthcare team about different options to help you!  Consider participating in "Finding Your New Normal" [facility]) survivorship series or other support groups through our [facility].   [employer] fitness program for cancer survivors.   Consider FREE counseling at cancer center.  For more information, contact Ottis Stain at [phone].  Keep your follow-up appointments with all of your specialists (Cancer doctors, Pulmonary doctors, Primary Care Provider, Dietician, Physical Therapist, etc.)  Limit alcohol consumption or abstain from consuming alcohol all together.   Maintain adequate nutrition with plenty of fresh fruits & vegetables.       Thank you so much for allowing [facility] to care for you during your cancer experience.  Our continued commitment is to you and your caregiver(s) health and happiness and again, Congratulations!     Mike Craze, NP Survivorship Program [facility] [phone]                 ~For additional information, see attached resources.          Additional Resources for Lung Cancer Survivors  Survivors of cancer often report some of the following needs or concerns after they have completed treatment.  Below are some suggested interventions and resources to help guide you.  Just because your cancer treatment has ended, it does not mean that we stop helping you manage your needs or concerns.  Please let us know how we can best help you in your new life post-cancer and your return to health and wellness!       Common Needs/Concerns Suggested intervention(s)  Fatigue . This is the most common symptom experienced by lung cancer survivors. You are not alone!  . Regular physical activity - walking 20 minutes daily . Evaluation for hypothyroidism, anemia, sleep disturbance, or depression . For more information, visit the National Cancer Institute's (NCI) website: http://www.kaiser.com/   Shortness of breath . Very common after lung cancer surgery due to reduced lung volume, as well as decreased physical activity. . Referral to Physical Therapy for improved endurance and stamina.  [place] booklet   Chronic cough or changes in your voice . The cough could be related to other airway diseases, like asthma or COPD, and those conditions should be treated to help with the cough.  . Could be related to acid reflux from the stomach.  You may need a medication to help with the acid which will help the cough.  . Over-the-counter cough suppressants can offer some relief.   . Sometimes, a short course of oral steroids can help suppress a cough.  Ask your doctor which treatment option may be best for you.   Chronic pain . About 50% of lung cancer survivors will experience some type of chronic pain, particularly after thoracotomy surgery.  . Communicate honestly with your  doctors and providers about your pain so that we may help make the best recommendations for you.   Smoking cessation and/or maintaining your tobacco-free lifestyle . Referral for Smoking Cessation Counseling . Support Groups and Counseling . Medications to help with nicotine withdrawal symptoms. . Consider acupuncture or hypnosis.  Marland Kitchen  For more information and valuable resources, visit: smokefree.gov   Memory problems and/or confusion . About 25% of cancer patients have some degree of cognitive dysfunction (meaning memory problems, trouble concentrating, trouble finding the right word, etc.) after treatment.  This usually gets better over time.  . Some patients may need evaluation for sleep disturbances contributing to memory problems or depression.  . For more information, visit NCI's website at: ForwardDrop.tn   Change in hearing . Certain chemotherapy drugs can cause hearing loss.  Most patients improve with time.  Marland Kitchen Referral to Audiologist for hearing evaluation and recommendations.   Peripheral neuropathy . Some chemotherapy drugs can cause numbness/tingling in the hands and feet.  This usually improves with time and when chemotherapy is completed.  However, some patients may experience symptoms long after their chemotherapy has finished.  . Gabapentin, a prescription medication, may help.  . Certain antidepressant drugs have been shown to help with neuropathy pain.   . Over-the-counter capsaicin cream may help temporarily decrease the sensation of pain.   . Very rarely are strong pain medications needed for treatment of neuropathy.  . Visit NCI's website for more information: http://www.taylor.net/   Depression/General Anxiety  Consider counseling and/or initiation of pharmacotherapy  Referral to Social Worker   Referral to [place]. Call [phone] for details.  Finding  Your New Normal [facility]) class through the [place].  Call [phone] for details.  Other resources: Nurse, learning disability.org, Call the [place] at [phone] for additional resources.  Insomnia or trouble sleeping  Practice good sleep habits  Discuss treatments of hot flashes  Consider treatment for anxiety  Read this from the [place] http://www.cancer.org/treatment/treatmentsandsideeffects/physicalsideeffects/fatigue/fatigueinpeoplewithcancer/fatigue-in-people-with-cancer-treating-fatigue  Weight loss/Loss of appetite  Dietary counseling with a Registered Dietician  Attend a healthy cooking class at [facility]. Call [phone] for details.  If your weight loss has been significant (greater than 15 pounds in 6 months or less), then call your doctor.  You need to be seen and evaluated.   Appetite stimulant medications can help improve appetite and help you gain weight.   Eat high-calorie foods as often as you can tolerate.   Weight gain or overweight  ( BMI over 25.0 m  or weight gain of >15 lbs)   Dietary counseling with a Registered Dietician  Attend a healthy cooking class at [facility]. Call [phone] for details.  Referral to a weight management support group (e.g. Weight Watchers, Overeaters Anonymous)  Change in your eating habits, your taste, or your smell . Choose foods with tart flavors like lemon wedges, lemonade, citrus fruits, vinegar, and pickled foods.  . Add sweeteners or a little bit of sugar to foods. A little sweetness can help increase pleasant tastes. . Season foods with herbs/spices/or other seasonings like onion, garlic, chili powder, barbecue sauce, mustard, ketchup, mint, etc.  . If meats taste strange, marinate or cook meats in sweet juices, fruits, acidic dressings, or wine.  . If certain foods or drinks smell unpleasant while cooking, choose foods that do not need to be cooked, like cold sandwiches, crackers and  cheese, yogurt and fruit, or cold cereal.  . Keep your mouth clean and healthy by rinsing and brushing your teeth after meals and before bed.   Decreased range of motion   Referral to Physical Therapy  Consider taking yoga or Tai Chi classes at the [facility].  Call [phone] for a schedule.  Intimacy and sexuality  Evaluation and treatment for anxiety, depression, as appropriate  Address  body image concerns  Attend the American Cancer Society's Look Good.Feel Better program. Call [phone] for a schedule of when this event is offered.   Try a vaginal lubricant (Astroglide) or moisturizer (Replens- 1x every 3 days)  Consider attending support groups at [facility]. Call [phone] for details.  Attend the Finding Your New Normal [facility]) class at [facility]. Call [phone] for details.  Marital/partner/family relationships  Referral to Forensic psychologist Groups at the [employer]   Finding Your New Normal [facility]) class through the [place]. Call [phone] for details.  Stigma of lung cancer diagnosis   Do not blame yourself for having cancer!  If you are experiencing anxiety or depression as a result of what others think about your diagnosis, then let us help you!  Referral to Education officer, museum or Counselor  Referral to [employer], health insurance, and/or finances  Referral to Education officer, museum  Concerns regarding spirituality, faith, coping, relating to God, loss of faith, facing my mortality, and/or loss of my sense of purpose  Referral to Chaplain   Referral to [employer] activities: www.hirschwellnessnetwork.org  Support Groups at the [facility]. Call [phone] for details.  Finding Your New Normal [facility]) class through [facility].  Call [phone] for details.  Returning to an exercise program  Consider joining the [facility], a 12-week program that meets twice a week for 90 minutes using traditional exercise  methods to ease you back into fitness and help you maintain a healthy weight.   This program is FREE for you and a friend.  Offered at several local [place].    Contact Elaina Hoops at [phone] or [email or web address]  Consider joining [place] or Yoga at [facility] Call [phone] for a schedule.

## 2015-05-11 NOTE — Telephone Encounter (Signed)
Ok to refill for 12 months

## 2015-05-11 NOTE — Telephone Encounter (Signed)
Ok to refill these medications?

## 2015-05-25 ENCOUNTER — Encounter: Payer: Self-pay | Admitting: Podiatry

## 2015-05-25 ENCOUNTER — Ambulatory Visit (INDEPENDENT_AMBULATORY_CARE_PROVIDER_SITE_OTHER): Payer: Medicare Other | Admitting: Podiatry

## 2015-05-25 DIAGNOSIS — M79673 Pain in unspecified foot: Secondary | ICD-10-CM | POA: Diagnosis not present

## 2015-05-25 DIAGNOSIS — B351 Tinea unguium: Secondary | ICD-10-CM

## 2015-05-25 DIAGNOSIS — E119 Type 2 diabetes mellitus without complications: Secondary | ICD-10-CM

## 2015-05-25 DIAGNOSIS — M79609 Pain in unspecified limb: Principal | ICD-10-CM

## 2015-05-25 NOTE — Progress Notes (Signed)
   Subjective:    Patient ID: Rachel Chapman, female    DOB: [birth year], 74 y.o.   MRN: [id number]  HPI This patient presents to the office for treatment of her nails.  She says she has moved from [facility] for treatment and her nails are painful walking and wearing her shoes.  She is diabetic with no foot complications.  She presents for preventive foot care services. The patient presents here today for B/L toenail debridement..    Review of Systems  HENT: Positive for hearing loss.   Eyes: Positive for visual disturbance.  Respiratory: Positive for cough, shortness of breath and wheezing.   Gastrointestinal: Positive for nausea, vomiting, diarrhea and constipation.  Musculoskeletal: Positive for gait problem.  Neurological: Positive for dizziness and light-headedness.       Objective:   Physical Exam GENERAL APPEARANCE: Alert, conversant. Appropriately groomed. No acute distress.  VASCULAR: Pedal pulses palpable at  [facility] and PT bilateral.  Capillary refill time is immediate to all digits,  Normal temperature gradient.  Digital hair growth is present bilateral  NEUROLOGIC: sensation is normal to 5.07 monofilament at 5/5 sites bilateral.  Light touch is intact bilateral, Muscle strength normal.  MUSCULOSKELETAL: acceptable muscle strength, tone and stability bilateral.  Intrinsic muscluature intact bilateral.  Rectus appearance of foot and digits noted bilateral.   DERMATOLOGIC: skin color, texture, and turgor are within normal limits.  No preulcerative lesions or ulcers  are seen, no interdigital maceration noted.  No open lesions present.  . No drainage noted.  Nails  Thick disfigured discolored nails both feet with no infections or ulcers.        Assessment & Plan:  Onychomycosis  IE  Debridement and grinding of long thick nails.   Gardiner Barefoot DPM

## 2015-05-29 ENCOUNTER — Encounter: Payer: Self-pay | Admitting: Adult Health

## 2015-05-29 ENCOUNTER — Ambulatory Visit (INDEPENDENT_AMBULATORY_CARE_PROVIDER_SITE_OTHER): Payer: Medicare Other | Admitting: Adult Health

## 2015-05-29 VITALS — BP 104/60 | Temp 98.4°F | Wt 143.4 lb

## 2015-05-29 DIAGNOSIS — R197 Diarrhea, unspecified: Secondary | ICD-10-CM

## 2015-05-29 DIAGNOSIS — J069 Acute upper respiratory infection, unspecified: Secondary | ICD-10-CM

## 2015-05-29 DIAGNOSIS — R0789 Other chest pain: Secondary | ICD-10-CM

## 2015-05-29 DIAGNOSIS — R071 Chest pain on breathing: Secondary | ICD-10-CM | POA: Diagnosis not present

## 2015-05-29 MED ORDER — HYDROCODONE-HOMATROPINE 5-1.5 MG/5ML PO SYRP: 5.0000 mL | ORAL_SOLUTION | Freq: Three times a day (TID) | ORAL | Status: DC | PRN

## 2015-05-29 MED ORDER — BENZONATATE 200 MG PO CAPS: 200.0000 mg | ORAL_CAPSULE | Freq: Two times a day (BID) | ORAL | Status: DC | PRN

## 2015-05-29 NOTE — Patient Instructions (Signed)
It was great seeing you today!  I have sent in a prescription for Tessalon Pearls.   You can also use the cough syrup at night. It will make you sleepy   Use Imodium, 1/2 tablet every 12 hours until you have a formed bowel movement.   Stay hydrated!!!!!  Follow up if no improvement.

## 2015-05-29 NOTE — Progress Notes (Signed)
Subjective:    Patient ID: Rachel Chapman, female    DOB: Sep 23, 1940, 75 y.o.   MRN: [id number]  HPI  75 year old female who presents to the clinic today with the complaints of non productive cough, nausea, occassioanl bouts of diarrhea. She also reports that she due to the constant cough that her ribs are starting to hurt and that she has a headache.   She denies feeling acutely ill, having a fever, or vomiting.   On a separate note, she is happy to report that she has been deemed " in remission" from her cancer!  Review of Systems  Constitutional: Positive for fever, appetite change and fatigue. Negative for chills, diaphoresis, activity change and unexpected weight change.  HENT: Negative for postnasal drip, rhinorrhea, sinus pressure and sore throat.   Respiratory: Positive for cough. Negative for shortness of breath and wheezing.   Cardiovascular: Negative.   Gastrointestinal: Positive for nausea and diarrhea. Negative for vomiting, constipation, blood in stool and rectal pain.  Genitourinary: Negative.   Musculoskeletal: Negative.   Skin: Negative.   Neurological: Positive for headaches. Negative for weakness and light-headedness.  Hematological: Negative.   Psychiatric/Behavioral: Positive for sleep disturbance.  All other systems reviewed and are negative.  Past Medical History  Diagnosis Date  . Hypertension   . Dyslipidemia   . Diabetes ([place])   . Hypothyroidism   . Pneumonia 2007  . Smoker     1.5 packs per day for 60 years.  Quit one month ago  . Anxiety   . Cancer ([place])   . 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 Marital Status: Widowed    Spouse Name: N/A  . Number of Children: N/A  . Years of Education: N/A   Occupational History  . Not on file.   Social History Main Topics  . Smoking status: Former Smoker -- 1.50 packs/day for 60 years    Quit date: 09/12/2014  . Smokeless tobacco: Not on  file  . Alcohol Use: No  . Drug Use: No  . Sexual Activity: No   Other Topics Concern  . Not on file   Social History Narrative   She has moved from [place]   She is retired from working with special education    Was married widowed twice.           Past Surgical History  Procedure Laterality Date  . Cesarean section      Family History  Problem Relation Age of Onset  . Clotting disorder Neg Hx   . Cancer Mother     lung non smoker  . Cancer Sister     colon  . Diabetes Sister   . Diabetes Mother     No Known Allergies  Current Outpatient Prescriptions on File Prior to Visit  Medication Sig Dispense Refill  . albuterol (PROVENTIL HFA;VENTOLIN HFA) 108 (90 BASE) MCG/ACT inhaler Inhale 2 puffs into the lungs every 6 (six) hours as needed for shortness of breath. 3.7 g 3  . cholecalciferol (VITAMIN D) 1000 UNITS tablet Take 1 tablet (1,000 Units total) by mouth daily. 90 tablet 3  . citalopram (CELEXA) 10 MG tablet Take 1 tablet (10 mg total) by mouth daily. 30 tablet 3  . glucose blood (ONETOUCH VERIO) test strip Test once daily. (Patient taking differently: 1 each by Other route. One to two times daily,) 100 each 5  . JANUVIA 100 MG  tablet TAKE ONE TABLET BY MOUTH ONCE DAILY 30 tablet 11  . levothyroxine (SYNTHROID, LEVOTHROID) 112 MCG tablet TAKE ONE TABLET BY MOUTH TWICE DAILY 90 tablet 3  . lidocaine-prilocaine (EMLA) cream Apply topically once. 30 each 2  . LORazepam (ATIVAN) 0.5 MG tablet Take 1 tablet (0.5 mg total) by mouth every 8 (eight) hours as needed for anxiety (or nausea). Could take 2 tab 30-45 mins prior to PET/CT and MRI Brain 50 tablet 0  . mirtazapine (REMERON) 15 MG tablet TAKE ONE-HALF TABLET (7.5 MG TOTAL) BY MOUTH AT BEDTIME 30 tablet 6  . ondansetron (ZOFRAN-ODT) 4 MG disintegrating tablet Take 4 mg by mouth every 8 (eight) hours as needed for nausea or vomiting. Reported on 03/15/2015    . ONETOUCH DELICA LANCETS FINE MISC Test once daily. 100 each 5  .  oxyCODONE (ROXICODONE) 5 MG/5ML solution Take 5-10 mLs (5-10 mg total) by mouth every 4 (four) hours as needed for moderate pain, severe pain or breakthrough pain. 473 mL 0  . polyethylene glycol (MIRALAX) packet Take 17 g by mouth daily. 30 each 1  . senna-docusate (SENNA S) 8.6-50 MG tablet Take 2 tablets by mouth at bedtime as needed for mild constipation. 60 tablet 1  . simvastatin (ZOCOR) 20 MG tablet Take 1 tablet (20 mg total) by mouth at bedtime. 90 tablet 3  . vitamin B-12 (CYANOCOBALAMIN) 1000 MCG tablet Take 1 tablet (1,000 mcg total) by mouth daily. 90 tablet 3  . lisinopril (PRINIVIL,ZESTRIL) 10 MG tablet Take 1 tablet (10 mg total) by mouth daily. (Patient not taking: Reported on 05/29/2015) 90 tablet 1   Current Facility-Administered Medications on File Prior to Visit  Medication Dose Route Frequency Provider Last Rate Last Dose  . 0.9 %  sodium chloride infusion   Intravenous Once [name], MD      . sodium chloride 0.9 % injection 10 mL  10 mL Intracatheter PRN [name], MD   10 mL at 11/11/14 1131  . sodium chloride 0.9 % injection 10 mL  10 mL Intravenous PRN [name], MD   10 mL at 01/18/15 1527    BP 104/60 mmHg  Temp(Src) 98.4 F (36.9 C) (Oral)  Wt 143 lb 6.4 oz (65.046 kg)       Objective:   Physical Exam  Constitutional: She is oriented to person, place, and time. She appears well-developed and well-nourished. No distress.  Cardiovascular: Normal rate, regular rhythm, normal heart sounds and intact distal pulses.  Exam reveals no gallop and no friction rub.   No murmur heard. Pulmonary/Chest: Effort normal and breath sounds normal. No respiratory distress. She has no wheezes. She has no rales. She exhibits no tenderness.  Abdominal: Soft. Bowel sounds are normal. She exhibits no distension and no mass. There is no tenderness. There is no rebound and no guarding.  Neurological: She is alert and oriented to person, place, and time.  Skin:  Skin is warm and dry. No rash noted. She is not diaphoretic. No erythema. No pallor.  Psychiatric: She has a normal mood and affect. Her behavior is normal. Judgment and thought content normal.  Nursing note and vitals reviewed.     Assessment & Plan:  1. URI (upper respiratory infection) - No concern for PNA or bronchitis at this time. Will treat conservatively.  - benzonatate (TESSALON) 200 MG capsule; Take 1 capsule (200 mg total) by mouth 2 (two) times daily as needed for cough.  Dispense: 20 capsule; Refill: 1 -  HYDROcodone-homatropine (HYCODAN) 5-1.5 MG/5ML syrup; Take 5 mLs by mouth every 8 (eight) hours as needed for cough.  Dispense: 120 mL; Refill: 0 - Follow up if no improvement.   2. Diarrhea, unspecified type 1/2 tab of imodium every 12 hours until formed bowel movement - Clear liquid diet for next 24-36 hours.  - Follow up if no improvement.   3. Costochondral chest pain - NSAIDs every 6-9 hours - benzonatate (TESSALON) 200 MG capsule; Take 1 capsule (200 mg total) by mouth 2 (two) times daily as needed for cough.  Dispense: 20 capsule; Refill: 1 - HYDROcodone-homatropine (HYCODAN) 5-1.5 MG/5ML syrup; Take 5 mLs by mouth every 8 (eight) hours as needed for cough.  Dispense: 120 mL; Refill: 0  [name], NP

## 2015-06-05 ENCOUNTER — Ambulatory Visit (HOSPITAL_COMMUNITY)
Admission: RE | Admit: 2015-06-05 | Discharge: 2015-06-05 | Disposition: A | Discharge status: Home / Self Care | Payer: Medicare Other | Source: Ambulatory Visit | Attending: Hematology | Admitting: Hematology

## 2015-06-05 ENCOUNTER — Encounter (HOSPITAL_COMMUNITY): Payer: Self-pay

## 2015-06-05 DIAGNOSIS — K8020 Calculus of gallbladder without cholecystitis without obstruction: Secondary | ICD-10-CM | POA: Diagnosis not present

## 2015-06-05 DIAGNOSIS — C3491 Malignant neoplasm of unspecified part of right bronchus or lung: Secondary | ICD-10-CM

## 2015-06-05 DIAGNOSIS — Z923 Personal history of irradiation: Secondary | ICD-10-CM | POA: Insufficient documentation

## 2015-06-05 DIAGNOSIS — I272 Other secondary pulmonary hypertension: Secondary | ICD-10-CM | POA: Diagnosis not present

## 2015-06-05 DIAGNOSIS — I2510 Atherosclerotic heart disease of native coronary artery without angina pectoris: Secondary | ICD-10-CM | POA: Insufficient documentation

## 2015-06-05 MED ORDER — IOPAMIDOL (ISOVUE-300) INJECTION 61%
100.0000 mL | Freq: Once | INTRAVENOUS | Status: AC | PRN
  Administered 2015-06-05: 100 mL via INTRAVENOUS

## 2015-06-05 MED ORDER — GADOBENATE DIMEGLUMINE 529 MG/ML IV SOLN
15.0000 mL | Freq: Once | INTRAVENOUS | Status: AC | PRN
  Administered 2015-06-05: 14 mL via INTRAVENOUS

## 2015-06-05 MED ORDER — DIATRIZOATE MEGLUMINE & SODIUM 66-10 % PO SOLN
30.0000 mL | Freq: Once | ORAL | Status: AC
  Administered 2015-06-05: 30 mL via ORAL

## 2015-06-13 ENCOUNTER — Ambulatory Visit (INDEPENDENT_AMBULATORY_CARE_PROVIDER_SITE_OTHER): Payer: Medicare Other | Admitting: Adult Health

## 2015-06-13 VITALS — BP 98/62 | Wt 143.1 lb

## 2015-06-13 DIAGNOSIS — J302 Other seasonal allergic rhinitis: Secondary | ICD-10-CM | POA: Diagnosis not present

## 2015-06-13 DIAGNOSIS — J209 Acute bronchitis, unspecified: Secondary | ICD-10-CM

## 2015-06-13 DIAGNOSIS — H9193 Unspecified hearing loss, bilateral: Secondary | ICD-10-CM | POA: Diagnosis not present

## 2015-06-13 MED ORDER — PREDNISONE 10 MG PO TABS: ORAL_TABLET | ORAL | Status: DC

## 2015-06-13 NOTE — Patient Instructions (Addendum)
It was great seeing you again.   I have sent in a prescription for prednisone. Take as directed.   Also get Claritin to help with your symptoms.     Acute Bronchitis Bronchitis is inflammation of the airways that extend from the windpipe into the lungs (bronchi). The inflammation often causes mucus to develop. This leads to a cough, which is the most common symptom of bronchitis.  In acute bronchitis, the condition usually develops suddenly and goes away over time, usually in a couple weeks. Smoking, allergies, and asthma can make bronchitis worse. Repeated episodes of bronchitis may cause further lung problems.  CAUSES Acute bronchitis is most often caused by the same virus that causes a cold. The virus can spread from person to person (contagious) through coughing, sneezing, and touching contaminated objects. SIGNS AND SYMPTOMS   Cough.   Fever.   Coughing up mucus.   Body aches.   Chest congestion.   Chills.   Shortness of breath.   Sore throat.  DIAGNOSIS  Acute bronchitis is usually diagnosed through a physical exam. Your health care provider will also ask you questions about your medical history. Tests, such as chest X-rays, are sometimes done to rule out other conditions.  TREATMENT  Acute bronchitis usually goes away in a couple weeks. Oftentimes, no medical treatment is necessary. Medicines are sometimes given for relief of fever or cough. Antibiotic medicines are usually not needed but may be prescribed in certain situations. In some cases, an inhaler may be recommended to help reduce shortness of breath and control the cough. A cool mist vaporizer may also be used to help thin bronchial secretions and make it easier to clear the chest.  HOME CARE INSTRUCTIONS  Get plenty of rest.   Drink enough fluids to keep your urine clear or pale yellow (unless you have a medical condition that requires fluid restriction). Increasing fluids may help thin your respiratory  secretions (sputum) and reduce chest congestion, and it will prevent dehydration.   Take medicines only as directed by your health care provider.  If you were prescribed an antibiotic medicine, finish it all even if you start to feel better.  Avoid smoking and secondhand smoke. Exposure to cigarette smoke or irritating chemicals will make bronchitis worse. If you are a smoker, consider using nicotine gum or skin patches to help control withdrawal symptoms. Quitting smoking will help your lungs heal faster.   Reduce the chances of another bout of acute bronchitis by washing your hands frequently, avoiding people with cold symptoms, and trying not to touch your hands to your mouth, nose, or eyes.   Keep all follow-up visits as directed by your health care provider.  SEEK MEDICAL CARE IF: Your symptoms do not improve after 1 week of treatment.  SEEK IMMEDIATE MEDICAL CARE IF:  You develop an increased fever or chills.   You have chest pain.   You have severe shortness of breath.  You have bloody sputum.   You develop dehydration.  You faint or repeatedly feel like you are going to pass out.  You develop repeated vomiting.  You develop a severe headache. MAKE SURE YOU:   Understand these instructions.  Will watch your condition.  Will get help right away if you are not doing well or get worse.   This information is not intended to replace advice given to you by your health care provider. Make sure you discuss any questions you have with your health care provider.   Document Released:  03/07/2004 Document Revised: 02/18/2014 Document Reviewed: 07/21/2012 Elsevier Interactive Patient Education [employer].

## 2015-06-13 NOTE — Progress Notes (Signed)
Subjective:    Patient ID: Rachel Chapman, female    DOB: [birth year], 75 y.o.   MRN: [id number]  Cough This is a recurrent problem. The current episode started more than 1 month ago. The problem has been unchanged. The problem occurs constantly. The cough is non-productive. Associated symptoms include rhinorrhea. Pertinent negatives include no chest pain, fever, headaches, hemoptysis, nasal congestion, postnasal drip, sore throat, shortness of breath or wheezing. Nothing aggravates the symptoms. She has tried OTC cough suppressant and prescription cough suppressant for the symptoms. The treatment provided no relief. Her past medical history is significant for bronchitis and COPD.   They have also not heard anything from Audiology about an appointment.    Review of Systems  Constitutional: Negative for fever.  HENT: Positive for rhinorrhea. Negative for postnasal drip and sore throat.   Respiratory: Positive for cough. Negative for hemoptysis, shortness of breath and wheezing.   Cardiovascular: Negative for chest pain.  Neurological: Negative for headaches.   Past Medical History  Diagnosis Date  . Hypertension   . Dyslipidemia   . Diabetes ([place])   . Hypothyroidism   . Pneumonia 2007  . Smoker     1.5 packs per day for 60 years.  Quit one month ago  . Anxiety   . Cancer ([place])   . 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 Marital Status: Widowed    Spouse Name: N/A  . Number of Children: N/A  . Years of Education: N/A   Occupational History  . Not on file.   Social History Main Topics  . Smoking status: Former Smoker -- 1.50 packs/day for 60 years    Quit date: 09/12/2014  . Smokeless tobacco: Not on file  . Alcohol Use: No  . Drug Use: No  . Sexual Activity: No   Other Topics Concern  . Not on file   Social History Narrative   She has moved from [place]   She is retired from working with special education    Was married widowed twice.           Past Surgical History  Procedure Laterality Date  . Cesarean section      Family History  Problem Relation Age of Onset  . Clotting disorder Neg Hx   . Cancer Mother     lung non smoker  . Cancer Sister     colon  . Diabetes Sister   . Diabetes Mother     No Known Allergies  Current Outpatient Prescriptions on File Prior to Visit  Medication Sig Dispense Refill  . albuterol (PROVENTIL HFA;VENTOLIN HFA) 108 (90 BASE) MCG/ACT inhaler Inhale 2 puffs into the lungs every 6 (six) hours as needed for shortness of breath. 3.7 g 3  . cholecalciferol (VITAMIN D) 1000 UNITS tablet Take 1 tablet (1,000 Units total) by mouth daily. 90 tablet 3  . citalopram (CELEXA) 10 MG tablet Take 1 tablet (10 mg total) by mouth daily. 30 tablet 3  . glucose blood (ONETOUCH VERIO) test strip Test once daily. (Patient taking differently: 1 each by Other route. One to two times daily,) 100 each 5  . HYDROcodone-homatropine (HYCODAN) 5-1.5 MG/5ML syrup Take 5 mLs by mouth every 8 (eight) hours as needed for cough. 120 mL 0  . JANUVIA 100 MG tablet TAKE ONE TABLET BY MOUTH ONCE DAILY 30 tablet 11  . levothyroxine (SYNTHROID, LEVOTHROID) 112 MCG tablet TAKE ONE TABLET  BY MOUTH TWICE DAILY 90 tablet 3  . lidocaine-prilocaine (EMLA) cream Apply topically once. 30 each 2  . lisinopril (PRINIVIL,ZESTRIL) 10 MG tablet Take 1 tablet (10 mg total) by mouth daily. 90 tablet 1  . LORazepam (ATIVAN) 0.5 MG tablet Take 1 tablet (0.5 mg total) by mouth every 8 (eight) hours as needed for anxiety (or nausea). Could take 2 tab 30-45 mins prior to PET/CT and MRI Brain 50 tablet 0  . mirtazapine (REMERON) 15 MG tablet TAKE ONE-HALF TABLET (7.5 MG TOTAL) BY MOUTH AT BEDTIME 30 tablet 6  . ondansetron (ZOFRAN-ODT) 4 MG disintegrating tablet Take 4 mg by mouth every 8 (eight) hours as needed for nausea or vomiting. Reported on 03/15/2015    . ONETOUCH DELICA LANCETS FINE MISC Test once daily.  100 each 5  . oxyCODONE (ROXICODONE) 5 MG/5ML solution Take 5-10 mLs (5-10 mg total) by mouth every 4 (four) hours as needed for moderate pain, severe pain or breakthrough pain. 473 mL 0  . polyethylene glycol (MIRALAX) packet Take 17 g by mouth daily. 30 each 1  . senna-docusate (SENNA S) 8.6-50 MG tablet Take 2 tablets by mouth at bedtime as needed for mild constipation. 60 tablet 1  . simvastatin (ZOCOR) 20 MG tablet Take 1 tablet (20 mg total) by mouth at bedtime. 90 tablet 3  . vitamin B-12 (CYANOCOBALAMIN) 1000 MCG tablet Take 1 tablet (1,000 mcg total) by mouth daily. 90 tablet 3   Current Facility-Administered Medications on File Prior to Visit  Medication Dose Route Frequency Provider Last Rate Last Dose  . 0.9 %  sodium chloride infusion   Intravenous Once [name], MD      . sodium chloride 0.9 % injection 10 mL  10 mL Intracatheter PRN [name], MD   10 mL at 11/11/14 1131  . sodium chloride 0.9 % injection 10 mL  10 mL Intravenous PRN [name], MD   10 mL at 01/18/15 1527    BP 98/62 mmHg  Wt 143 lb 1.6 oz (64.91 kg)       Objective:   Physical Exam  Constitutional: She is oriented to person, place, and time. She appears well-developed and well-nourished. No distress.  HENT:  Head: Normocephalic and atraumatic.  Right Ear: Hearing, tympanic membrane, external ear and ear canal normal.  Left Ear: Hearing, tympanic membrane, external ear and ear canal normal.  Nose: Mucosal edema and rhinorrhea present. Right sinus exhibits no maxillary sinus tenderness and no frontal sinus tenderness. Left sinus exhibits no maxillary sinus tenderness and no frontal sinus tenderness.  Mouth/Throat: Oropharynx is clear and moist. No oropharyngeal exudate.  Cardiovascular: Normal rate, regular rhythm, normal heart sounds and intact distal pulses.  Exam reveals no gallop and no friction rub.   No murmur heard. Pulmonary/Chest: Effort normal and breath sounds normal. No  respiratory distress. She has no wheezes. She has no rales. She exhibits no tenderness.  Neurological: She is alert and oriented to person, place, and time.  Skin: Skin is warm and dry. No rash noted. She is not diaphoretic. No erythema. No pallor.  Psychiatric: She has a normal mood and affect. Her behavior is normal. Judgment and thought content normal.  Nursing note and vitals reviewed.      Assessment & Plan:  1. Acute bronchitis, unspecified organism - No concern for pneumonia.  - She does have a significant smoking history  - predniSONE (DELTASONE) 10 MG tablet; 40 mg x 3 days, 20 mg x 3  days, 10 mg x 3 days.  Dispense: 21 tablet; Refill: 0 - Follow up if no improvement.  2. Seasonal allergies - OTC Claritin or Flonase  3. Hard of hearing, bilateral  - Ambulatory referral to Audiology

## 2015-06-16 ENCOUNTER — Ambulatory Visit (HOSPITAL_BASED_OUTPATIENT_CLINIC_OR_DEPARTMENT_OTHER): Payer: Medicare Other | Admitting: Hematology

## 2015-06-16 ENCOUNTER — Encounter: Payer: Self-pay | Admitting: Hematology

## 2015-06-16 ENCOUNTER — Ambulatory Visit (HOSPITAL_BASED_OUTPATIENT_CLINIC_OR_DEPARTMENT_OTHER): Payer: Medicare Other

## 2015-06-16 ENCOUNTER — Other Ambulatory Visit (HOSPITAL_BASED_OUTPATIENT_CLINIC_OR_DEPARTMENT_OTHER): Payer: Medicare Other

## 2015-06-16 ENCOUNTER — Other Ambulatory Visit: Payer: Self-pay | Admitting: Radiation Therapy

## 2015-06-16 ENCOUNTER — Telehealth: Payer: Self-pay | Admitting: Hematology

## 2015-06-16 VITALS — BP 127/65 | HR 102 | Temp 97.8°F | Resp 18 | Wt 144.2 lb

## 2015-06-16 DIAGNOSIS — E039 Hypothyroidism, unspecified: Secondary | ICD-10-CM | POA: Diagnosis not present

## 2015-06-16 DIAGNOSIS — Z452 Encounter for adjustment and management of vascular access device: Secondary | ICD-10-CM | POA: Diagnosis present

## 2015-06-16 DIAGNOSIS — C3490 Malignant neoplasm of unspecified part of unspecified bronchus or lung: Secondary | ICD-10-CM | POA: Diagnosis present

## 2015-06-16 DIAGNOSIS — D6481 Anemia due to antineoplastic chemotherapy: Secondary | ICD-10-CM | POA: Diagnosis not present

## 2015-06-16 DIAGNOSIS — E119 Type 2 diabetes mellitus without complications: Secondary | ICD-10-CM

## 2015-06-16 DIAGNOSIS — D649 Anemia, unspecified: Secondary | ICD-10-CM

## 2015-06-16 DIAGNOSIS — C7931 Secondary malignant neoplasm of brain: Secondary | ICD-10-CM

## 2015-06-16 DIAGNOSIS — C3491 Malignant neoplasm of unspecified part of right bronchus or lung: Secondary | ICD-10-CM

## 2015-06-16 LAB — COMPREHENSIVE METABOLIC PANEL
ALBUMIN: 3.4 g/dL — AB (ref 3.5–5.0)
ALK PHOS: 68 U/L (ref 40–150)
ALT: 21 U/L (ref 0–55)
AST: 15 U/L (ref 5–34)
Anion Gap: 8 mEq/L (ref 3–11)
BUN: 29.9 mg/dL — AB (ref 7.0–26.0)
CO2: 25 meq/L (ref 22–29)
Calcium: 9.3 mg/dL (ref 8.4–10.4)
Chloride: 107 mEq/L (ref 98–109)
Creatinine: 1.3 mg/dL — ABNORMAL HIGH (ref 0.6–1.1)
EGFR: 40 mL/min/{1.73_m2} — ABNORMAL LOW (ref >90)
GLUCOSE: 168 mg/dL — AB (ref 70–140)
Potassium: 4.2 mEq/L (ref 3.5–5.1)
SODIUM: 139 meq/L (ref 136–145)
TOTAL PROTEIN: 6.8 g/dL (ref 6.4–8.3)

## 2015-06-16 LAB — CBC & DIFF AND RETIC
BASO%: 0.1 % (ref 0.0–2.0)
BASOS ABS: 0 10*3/uL (ref 0.0–0.1)
EOS%: 0.6 % (ref 0.0–7.0)
Eosinophils Absolute: 0.1 10*3/uL (ref 0.0–0.5)
HEMATOCRIT: 31 % — AB (ref 34.8–46.6)
HEMOGLOBIN: 10.2 g/dL — AB (ref 11.6–15.9)
IMMATURE RETIC FRACT: 9.8 % (ref 1.60–10.00)
LYMPH#: 1.6 10*3/uL (ref 0.9–3.3)
LYMPH%: 13.6 % — AB (ref 14.0–49.7)
MCH: 29.8 pg (ref 25.1–34.0)
MCHC: 32.9 g/dL (ref 31.5–36.0)
MCV: 90.6 fL (ref 79.5–101.0)
MONO#: 1.1 10*3/uL — AB (ref 0.1–0.9)
MONO%: 9.9 % (ref 0.0–14.0)
NEUT#: 8.7 10*3/uL — ABNORMAL HIGH (ref 1.5–6.5)
NEUT%: 75.8 % (ref 38.4–76.8)
PLATELETS: 222 10*3/uL (ref 145–400)
RBC: 3.42 10*6/uL — AB (ref 3.70–5.45)
RDW: 15.1 % — ABNORMAL HIGH (ref 11.2–14.5)
RETIC CT ABS: 74.9 10*3/uL (ref 33.70–90.70)
Retic %: 2.19 % — ABNORMAL HIGH (ref 0.70–2.10)
WBC: 11.4 10*3/uL — ABNORMAL HIGH (ref 3.9–10.3)

## 2015-06-16 LAB — LACTATE DEHYDROGENASE: LDH: 168 U/L (ref 125–245)

## 2015-06-16 MED ORDER — SODIUM CHLORIDE 0.9 % IJ SOLN
10.0000 mL | INTRAMUSCULAR | Status: DC | PRN
  Administered 2015-06-16: 10 mL
  Filled 2015-06-16: qty 10

## 2015-06-16 MED ORDER — HEPARIN SOD (PORK) LOCK FLUSH 100 UNIT/ML IV SOLN
500.0000 [IU] | Freq: Once | INTRAVENOUS | Status: AC | PRN
  Administered 2015-06-16: 500 [IU]
  Filled 2015-06-16: qty 5

## 2015-06-16 NOTE — Patient Instructions (Signed)

## 2015-06-16 NOTE — Telephone Encounter (Signed)
Gave pt appt & avs °

## 2015-06-18 NOTE — Progress Notes (Signed)
Rachel Chapman  HEMATOLOGY ONCOLOGY PROGRESS NOTE  Date of service: .06/16/2015   Patient Care Team: [name], NP as PCP - General (Family Medicine)  Diagnosis: Limited stage small cell lung cancer  Current Treatment: monitoring  Completed treatment  Status post 5 cycles of carboplatin and etoposide Status post Concurrent radiation therapy completed 12/16/2014.  Prophylactic cranial radiation therapy  INTERVAL HISTORY:  Patient is here for her scheduled follow-up in clinic. She is in good spirits and has no acute new symptoms. Notes that her hair was starting to grow back after completion of chemotherapy but she has developed alopecia again after her cranial radiation.  No new shortness of breath or chest pain. No headaches or new focal neurological deficits. No other acute new focal symptoms. Her imaging shows no overt evidence of small cell lung cancer progression. She notes that she has been doing better emotionally.  REVIEW OF SYSTEMS:    10 Point review of systems of done and is negative except as noted above.  . Past Medical History  Diagnosis Date  . Hypertension   . Dyslipidemia   . Diabetes ([place])   . Hypothyroidism   . Pneumonia 2007  . Smoker     1.5 packs per day for 60 years.  Quit one month ago  . Anxiety   . Cancer ([place])   . Lung cancer ([place]) 8/216    hilar/mediastinum lung scca    . Past Surgical History  Procedure Laterality Date  . Cesarean section      . Social History  Substance Use Topics  . Smoking status: Former Smoker -- 1.50 packs/day for 60 years    Quit date: 09/12/2014  . Smokeless tobacco: None  . Alcohol Use: No    ALLERGIES:  has No Known Allergies.  MEDICATIONS:  Current Outpatient Prescriptions  Medication Sig Dispense Refill  . albuterol (PROVENTIL HFA;VENTOLIN HFA) 108 (90 BASE) MCG/ACT inhaler Inhale 2 puffs into the lungs every 6 (six) hours as needed for shortness of breath. 3.7 g 3  . cholecalciferol (VITAMIN D) 1000  UNITS tablet Take 1 tablet (1,000 Units total) by mouth daily. 90 tablet 3  . citalopram (CELEXA) 10 MG tablet Take 1 tablet (10 mg total) by mouth daily. (Patient taking differently: Take 20 mg by mouth daily. ) 30 tablet 3  . glucose blood (ONETOUCH VERIO) test strip Test once daily. (Patient taking differently: 1 each by Other route. One to two times daily,) 100 each 5  . HYDROcodone-homatropine (HYCODAN) 5-1.5 MG/5ML syrup Take 5 mLs by mouth every 8 (eight) hours as needed for cough. 120 mL 0  . JANUVIA 100 MG tablet TAKE ONE TABLET BY MOUTH ONCE DAILY 30 tablet 11  . levothyroxine (SYNTHROID, LEVOTHROID) 112 MCG tablet TAKE ONE TABLET BY MOUTH TWICE DAILY 90 tablet 3  . lidocaine-prilocaine (EMLA) cream Apply topically once. 30 each 2  . lisinopril (PRINIVIL,ZESTRIL) 10 MG tablet Take 1 tablet (10 mg total) by mouth daily. 90 tablet 1  . LORazepam (ATIVAN) 0.5 MG tablet Take 1 tablet (0.5 mg total) by mouth every 8 (eight) hours as needed for anxiety (or nausea). Could take 2 tab 30-45 mins prior to PET/CT and MRI Brain 50 tablet 0  . mirtazapine (REMERON) 15 MG tablet TAKE ONE-HALF TABLET (7.5 MG TOTAL) BY MOUTH AT BEDTIME (Patient taking differently: TAKE ONE TABLET (7.5 MG TOTAL) BY MOUTH AT BEDTIME) 30 tablet 6  . ondansetron (ZOFRAN-ODT) 4 MG disintegrating tablet Take 4 mg by mouth every 8 (eight) hours  as needed for nausea or vomiting. Reported on 03/15/2015    . ONETOUCH DELICA LANCETS FINE MISC Test once daily. 100 each 5  . oxyCODONE (ROXICODONE) 5 MG/5ML solution Take 5-10 mLs (5-10 mg total) by mouth every 4 (four) hours as needed for moderate pain, severe pain or breakthrough pain. 473 mL 0  . polyethylene glycol (MIRALAX) packet Take 17 g by mouth daily. 30 each 1  . predniSONE (DELTASONE) 10 MG tablet 40 mg x 3 days, 20 mg x 3 days, 10 mg x 3 days. 21 tablet 0  . senna-docusate (SENNA S) 8.6-50 MG tablet Take 2 tablets by mouth at bedtime as needed for mild constipation. 60 tablet 1   . simvastatin (ZOCOR) 20 MG tablet Take 1 tablet (20 mg total) by mouth at bedtime. 90 tablet 3  . vitamin B-12 (CYANOCOBALAMIN) 1000 MCG tablet Take 1 tablet (1,000 mcg total) by mouth daily. 90 tablet 3   No current facility-administered medications for this visit.   Facility-Administered Medications Ordered in Other Visits  Medication Dose Route Frequency Provider Last Rate Last Dose  . 0.9 %  sodium chloride infusion   Intravenous Once [name], MD      . sodium chloride 0.9 % injection 10 mL  10 mL Intracatheter PRN [name], MD   10 mL at 11/11/14 1131  . sodium chloride 0.9 % injection 10 mL  10 mL Intravenous PRN [name], MD   10 mL at 01/18/15 1527    PHYSICAL EXAMINATION: ECOG PERFORMANCE STATUS: 1 - Symptomatic but completely ambulatory  . Filed Vitals:   06/16/15 0943  BP: 127/65  Pulse: 102  Temp: 97.8 F (36.6 C)  Resp: 18    Filed Weights   06/16/15 0943  Weight: 144 lb 3.2 oz (65.409 kg)   .Body mass index is 26.37 kg/(m^2).  GENERAL:alert, in no acute distress and comfortable SKIN: skin color, texture, turgor are normal, no rashes or significant lesions EYES: normal, conjunctiva are pink and non-injected, sclera clear OROPHARYNX:no exudate, no erythema and lips, buccal mucosa, and tongue normal  NECK: supple, no JVD, thyroid normal size, non-tender, without nodularity LYMPH:  no palpable lymphadenopathy in the cervical, axillary or inguinal LUNGS: clear to auscultation with normal respiratory effort HEART: regular rate & rhythm,  no murmurs and no lower extremity edema ABDOMEN: abdomen soft, non-tender, normoactive bowel sounds  Musculoskeletal: no cyanosis of digits and no clubbing  PSYCH: alert & oriented x 3 with fluent speech NEURO: no focal motor/sensory deficits  LABORATORY DATA:   I have reviewed the data as listed  . CBC Latest Ref Rng 06/16/2015 05/05/2015 03/07/2015  WBC 3.9 - 10.3 10e3/uL 11.4(H) 5.9 5.8    Hemoglobin 11.6 - 15.9 g/dL 10.2(L) 10.2(L) 9.2(L)  Hematocrit 34.8 - 46.6 % 31.0(L) 30.1(L) 27.7(L)  Platelets 145 - 400 10e3/uL 222 167 163  . CMP Latest Ref Rng 06/16/2015 05/05/2015 03/07/2015  Glucose 70 - 140 mg/dl 168(H) 151(H) 109  BUN 7.0 - 26.0 mg/dL 29.9(H) 23.6 19.9  Creatinine 0.6 - 1.1 mg/dL 1.3(H) 1.2(H) 1.1  Sodium 136 - 145 mEq/L 139 139 140  Potassium 3.5 - 5.1 mEq/L 4.2 4.9 4.3  CO2 22 - 29 mEq/L '25 25 23  '$ Calcium 8.4 - 10.4 mg/dL 9.3 9.2 8.7  Total Protein 6.4 - 8.3 g/dL 6.8 6.7 6.6  Total Bilirubin 0.20 - 1.20 mg/dL <0.30 <0.30 0.36  Alkaline Phos 40 - 150 U/L 68 86 88  AST 5 - 34 U/L 15  16 27  ALT 0 - 55 U/L 21 17 34      RADIOGRAPHIC STUDIES: I have personally reviewed the radiological images as listed and agreed with the findings in the report. Ct Chest W Contrast  06/05/2015  CLINICAL DATA:  Small cell lung cancer, chemotherapy and radiation therapy complete. Cough. EXAM: CT CHEST, ABDOMEN, AND PELVIS WITH CONTRAST TECHNIQUE: Multidetector CT imaging of the chest, abdomen and pelvis was performed following the standard protocol during bolus administration of intravenous contrast. CONTRAST:  153m ISOVUE-300 IOPAMIDOL (ISOVUE-300) INJECTION 61% COMPARISON:  PET 03/06/2015. FINDINGS: CT CHEST FINDINGS Mediastinum/Lymph Nodes: Right IJ Port-A-Cath terminates at the SVC RA junction. No pathologically enlarged mediastinal, hilar or axillary lymph nodes. Pulmonary arteries are enlarged. Heart size normal. Coronary artery calcification. No pericardial effusion. Lungs/Pleura: Increasing consolidation, bronchiectasis and airspace opacification in the medial aspect of the right hemi thorax, sharply demarcated along the lateral margin. Pleural parenchymal scarring in the left upper lobe, stable. No pleural fluid. Airway is unremarkable. Musculoskeletal: No worrisome lytic or sclerotic lesions. CT ABDOMEN PELVIS FINDINGS Hepatobiliary: Liver is unremarkable. Numerous stones are  seen in the gallbladder. No biliary ductal dilatation. Pancreas: Negative. Spleen: Negative. Adrenals/Urinary Tract: Adrenal glands are unremarkable. Sub cm low-attenuation lesions in both kidneys are too small to characterize. Renal cortical scarring on the right. Ureters are decompressed. Bladder is unremarkable. Stomach/Bowel: Stomach, small bowel, appendix and colon are unremarkable. Vascular/Lymphatic: Atherosclerotic calcification of the arterial vasculature without abdominal aortic aneurysm. No pathologically enlarged lymph nodes. Reproductive: Uterus and ovaries are visualized. Other: No free fluid.  Mesenteries and peritoneum are unremarkable. Musculoskeletal: Probable bone islands in the left femur. No worrisome lytic or sclerotic lesions. Chronic L5 pars defects with grade 2 anterolisthesis of L5 on S1. IMPRESSION: 1. Evolutionary changes of radiation therapy in the right hemi thorax. No evidence of metastatic disease. 2. Enlarged pulmonary arteries, indicative of pulmonary arterial hypertension. 3. Coronary artery calcification. 4. Cholelithiasis. Electronically Signed   By: MLorin PicketM.D.   On: 06/05/2015 10:51   Mr BJeri CosWRCContrast  06/05/2015  CLINICAL DATA:  Small cell lung cancer.  Chemo radiation completed. EXAM: MRI HEAD WITHOUT AND WITH CONTRAST TECHNIQUE: Multiplanar, multiecho pulse sequences of the brain and surrounding structures were obtained without and with intravenous contrast. CONTRAST:  142mMULTIHANCE GADOBENATE DIMEGLUMINE 529 MG/ML IV SOLN COMPARISON:  MRI 03/06/2015, 10/19/2014 FINDINGS: Mild atrophy unchanged.  Negative for hydrocephalus. 6 mm extra-axial nodule in the left occipital parietal lobe is stable. This does not enhance but may represent a calcified meningioma. Negative for acute infarct. Extensive chronic white matter changes appear progressive and likely related to chemo/ radiation. Chronic changes also in the pons which are mild and progressive. Negative  for acute infarct. Small chronic micro hemorrhage left inferior cerebellar tonsil unchanged from prior studies. Postcontrast imaging reveals ill-defined enhancement in the left superior frontal gyrus unchanged from 10/19/2014. This is not seen on the more recent study. 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 No other areas of abnormal enhancement. Negative for metastatic disease to the brain. Calvarium intact. Leptomeningeal enhancement normal. Mild mucosal edema paranasal sinuses. Mucosal edema left mastoid tip. IMPRESSION: Progressive white matter changes likely due to chemo and radiation. Whole-brain radiation completed 03/31/2015. Negative for acute infarct. 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 No intracranial metastatic disease. Electronically Signed   By: ChFranchot Gallo.D.   On: 06/05/2015 14:16   Ct Abdomen Pelvis  W Contrast  06/05/2015  CLINICAL DATA:  Small cell lung cancer, chemotherapy and radiation therapy complete. Cough. EXAM: CT CHEST, ABDOMEN, AND PELVIS WITH CONTRAST TECHNIQUE: Multidetector CT imaging of the chest, abdomen and pelvis was performed following the standard protocol during bolus administration of intravenous contrast. CONTRAST:  189m ISOVUE-300 IOPAMIDOL (ISOVUE-300) INJECTION 61% COMPARISON:  PET 03/06/2015. FINDINGS: CT CHEST FINDINGS Mediastinum/Lymph Nodes: Right IJ Port-A-Cath terminates at the SVC RA junction. No pathologically enlarged mediastinal, hilar or axillary lymph nodes. Pulmonary arteries are enlarged. Heart size normal. Coronary artery calcification. No pericardial effusion. Lungs/Pleura: Increasing consolidation, bronchiectasis and airspace opacification in the medial aspect of the right hemi thorax, sharply demarcated along the lateral margin. Pleural parenchymal scarring in the left upper lobe, stable. No pleural fluid. Airway is unremarkable. Musculoskeletal: No  worrisome lytic or sclerotic lesions. CT ABDOMEN PELVIS FINDINGS Hepatobiliary: Liver is unremarkable. Numerous stones are seen in the gallbladder. No biliary ductal dilatation. Pancreas: Negative. Spleen: Negative. Adrenals/Urinary Tract: Adrenal glands are unremarkable. Sub cm low-attenuation lesions in both kidneys are too small to characterize. Renal cortical scarring on the right. Ureters are decompressed. Bladder is unremarkable. Stomach/Bowel: Stomach, small bowel, appendix and colon are unremarkable. Vascular/Lymphatic: Atherosclerotic calcification of the arterial vasculature without abdominal aortic aneurysm. No pathologically enlarged lymph nodes. Reproductive: Uterus and ovaries are visualized. Other: No free fluid.  Mesenteries and peritoneum are unremarkable. Musculoskeletal: Probable bone islands in the left femur. No worrisome lytic or sclerotic lesions. Chronic L5 pars defects with grade 2 anterolisthesis of L5 on S1. IMPRESSION: 1. Evolutionary changes of radiation therapy in the right hemi thorax. No evidence of metastatic disease. 2. Enlarged pulmonary arteries, indicative of pulmonary arterial hypertension. 3. Coronary artery calcification. 4. Cholelithiasis. Electronically Signed   By: MLorin PicketM.D.   On: 06/05/2015 10:51    ASSESSMENT & PLAN:   75year old female with history of heavy smoking with  #1 Limited stage Small cell lung cancer. CT scan on 09/26/2014 at the outside hospital showed mediastinal adenopathy which was bronchoscopically biopsied and noted to be consistent with small cell lung cancer. No overt focal neurological deficits or symptoms suggestive of brain metastases at this time. Patient is status post 5 cycles of carboplatin etoposide with concurrent radiation therapy. She was noted to have good response on an interim PET CT scan on 11/29/2014.  PET CT scan after completion of 5 cycles of carboplatin and etoposide chemotherapy with concurrent radiation  showed resolution of her lung mass.  MRI of the brain shows no evidence of metastatic disease at this time.  Patient completed prophylactic cranial irradiation  CT chest abdomen pelvis and MRI of the brain on 06/05/2015 shows no new evidence of metastatic disease.  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 #4 protein calorie malnutrition and weight loss due to small cell lung cancer. Much improved. #6 ex-smoker 90-pack-year history of smoking at one month ago. Continues to stay off her cigarettes. #7 COPD -- has never been diagnosed as such. He has been recently given an albuterol inhaler as needed. Not oxygen dependent. #8 hypothyroidism -continue with levothyroxine replacement #9 Diabetes patient has been controlled with oral hypoglycemics.  #02 severe anxiety.Patient appears much improved on Celexa and when necessary lorazepam. Plan -patient has no evidence of disease progression at this time. No indication for additional treatment currently. --We'll restage with CT chest abdomen pelvis in 3 months. Earlier if any other acute new symptoms. -No  significant remaining toxicities from chemotherapy at this time. -continues to have alopecia especially due to radiation therapy at this time. -encouraged  Healthy diet and exercise.  Return to care with Dr. Irene Limbo in 3 months with repeat CBC, CMP and CT chest abdomen pelvis. Earlier if any new concerns or symptoms. . Orders Placed This Encounter  Procedures  . CT Chest W Contrast    Standing Status: Future     Number of Occurrences:      Standing Expiration Date: 08/15/2016    Order Specific Question:  Reason for Exam (SYMPTOM  OR DIAGNOSIS REQUIRED)    Answer:  restaging for small cell lung cancer    Order Specific Question:  Preferred imaging location?    Answer:  [facility]  . CT Abdomen Pelvis W Contrast     Standing Status: Future     Number of Occurrences:      Standing Expiration Date: 09/15/2016    Order Specific Question:  Reason for exam:    Answer:  restaging for small cell lung cancer    Order Specific Question:  Preferred imaging location?    Answer:  [facility]  . CBC & Diff and Retic    Standing Status: Future     Number of Occurrences:      Standing Expiration Date: 07/20/2016  . Comprehensive metabolic panel    Standing Status: Future     Number of Occurrences:      Standing Expiration Date: 06/15/2016  . Lactate dehydrogenase (LDH)    Standing Status: Future     Number of Occurrences:      Standing Expiration Date: 06/15/2016    I spent 20 minutes counseling the patient face to face. The total time spent in the appointment was 25 minutes and more than 50% was on counseling and direct patient cares.    Sullivan Lone MD [place] AAHIVMS [facility] Hematology/Oncology Physician [facility]  (Office):       [phone] (Work cell):  [phone] (Fax):           [phone]

## 2015-06-21 ENCOUNTER — Encounter: Payer: Self-pay | Admitting: Adult Health

## 2015-06-21 ENCOUNTER — Ambulatory Visit (INDEPENDENT_AMBULATORY_CARE_PROVIDER_SITE_OTHER): Payer: Medicare Other | Admitting: Adult Health

## 2015-06-21 VITALS — BP 106/52 | Ht 62.0 in | Wt 144.7 lb

## 2015-06-21 DIAGNOSIS — B370 Candidal stomatitis: Secondary | ICD-10-CM

## 2015-06-21 DIAGNOSIS — R4189 Other symptoms and signs involving cognitive functions and awareness: Secondary | ICD-10-CM

## 2015-06-21 DIAGNOSIS — E1121 Type 2 diabetes mellitus with diabetic nephropathy: Secondary | ICD-10-CM

## 2015-06-21 DIAGNOSIS — F32A Depression, unspecified: Secondary | ICD-10-CM

## 2015-06-21 DIAGNOSIS — F329 Major depressive disorder, single episode, unspecified: Secondary | ICD-10-CM

## 2015-06-21 DIAGNOSIS — R05 Cough: Secondary | ICD-10-CM

## 2015-06-21 DIAGNOSIS — R5383 Other fatigue: Secondary | ICD-10-CM

## 2015-06-21 DIAGNOSIS — R053 Chronic cough: Secondary | ICD-10-CM

## 2015-06-21 LAB — HEPATIC FUNCTION PANEL
ALBUMIN: 3.7 g/dL (ref 3.5–5.2)
ALT: 29 U/L (ref 0–35)
AST: 22 U/L (ref 0–37)
Alkaline Phosphatase: 69 U/L (ref 39–117)
BILIRUBIN DIRECT: 0.1 mg/dL (ref 0.0–0.3)
TOTAL PROTEIN: 6.4 g/dL (ref 6.0–8.3)
Total Bilirubin: 0.4 mg/dL (ref 0.2–1.2)

## 2015-06-21 LAB — CBC WITH DIFFERENTIAL/PLATELET
BASOS ABS: 0 10*3/uL (ref 0.0–0.1)
Basophils Relative: 0 % (ref 0.0–3.0)
EOS ABS: 0.2 10*3/uL (ref 0.0–0.7)
Eosinophils Relative: 1.5 % (ref 0.0–5.0)
HEMATOCRIT: 32.8 % — AB (ref 36.0–46.0)
HEMOGLOBIN: 11 g/dL — AB (ref 12.0–15.0)
LYMPHS PCT: 5.3 % — AB (ref 12.0–46.0)
Lymphs Abs: 0.6 10*3/uL — ABNORMAL LOW (ref 0.7–4.0)
MCHC: 33.5 g/dL (ref 30.0–36.0)
MCV: 91 fl (ref 78.0–100.0)
MONOS PCT: 7.8 % (ref 3.0–12.0)
Monocytes Absolute: 0.9 10*3/uL (ref 0.1–1.0)
Neutro Abs: 9.8 10*3/uL — ABNORMAL HIGH (ref 1.4–7.7)
Neutrophils Relative %: 85.4 % — ABNORMAL HIGH (ref 43.0–77.0)
PLATELETS: 201 10*3/uL (ref 150.0–400.0)
RBC: 3.6 Mil/uL — AB (ref 3.87–5.11)
RDW: 16.4 % — ABNORMAL HIGH (ref 11.5–15.5)
WBC: 11.5 10*3/uL — AB (ref 4.0–10.5)

## 2015-06-21 LAB — VITAMIN D 25 HYDROXY (VIT D DEFICIENCY, FRACTURES): VITD: 24.88 ng/mL — ABNORMAL LOW (ref 30.00–100.00)

## 2015-06-21 LAB — VITAMIN B12: Vitamin B-12: >1500 pg/mL — ABNORMAL HIGH (ref 211–911)

## 2015-06-21 LAB — TSH: TSH: 0.25 u[IU]/mL — AB (ref 0.35–4.50)

## 2015-06-21 LAB — HEMOGLOBIN A1C: Hgb A1c MFr Bld: 7.3 % — ABNORMAL HIGH (ref 4.6–6.5)

## 2015-06-21 MED ORDER — NYSTATIN 100000 UNIT/ML MT SUSP: 5.0000 mL | Freq: Four times a day (QID) | OROMUCOSAL | Status: DC

## 2015-06-21 NOTE — Progress Notes (Addendum)
Subjective:    Patient ID: Rachel Chapman, female    DOB: September 21, 1940, 75 y.o.   MRN: [id number]  HPI  75 year old female who presents to the office for follow up. Her daughter in law and son have had many concerns about her behavior at home. Per family, she is not eating a lot, she stays in her room for days at a time, they feel as though her short term memory is fading and she is becoming more confused.   Today in the office Rachel Chapman has this to say.   1. She feels as though her family only cooks fried foods and foods that she does not like. She would rather go out to eat then eat at home. She does not feel like her appetite is poor.   2. She feels as though her room at her son's home is a "sanctuary" for her. It is some place for her to retreat to. There are five people beside her in the household and being a strong [place] family, she reports there is a lot of arguing and yelling. She would rather be in her room then be out in the home. She does endorse that she does stay in her room for days at a time, She knows that his is not good.   3. She does not feel depressed and in fact feels " grateful that I have been given a second change".     She is also complaining of a constant cough that she has had for over a year. The cough comes and goes and is sometimes productive. She does feel congested in the chest. Denies any fevers, wheezing, or shortness of breath.     Review of Systems  HENT: Negative.   Respiratory: Positive for cough (semi productive). Negative for shortness of breath and wheezing.   Cardiovascular: Negative.   Gastrointestinal: Negative.   Skin: Negative.   Neurological: Negative.   Psychiatric/Behavioral: Negative.   All other systems reviewed and are negative.  Past Medical History  Diagnosis Date  . Hypertension   . Dyslipidemia   . Diabetes ([place])   . Hypothyroidism   . Pneumonia 2007  . Smoker     1.5 packs per day for 60 years.  Quit one month ago  .  Anxiety   . Cancer ([place])   . Lung cancer ([place]) 8/216    hilar/mediastinum lung scca    Social History   Social History  . Marital Status: Widowed    Spouse Name: N/A  . Number of Children: N/A  . Years of Education: N/A   Occupational History  . Not on file.   Social History Main Topics  . Smoking status: Former Smoker -- 1.50 packs/day for 60 years    Quit date: 09/12/2014  . Smokeless tobacco: Not on file  . Alcohol Use: No  . Drug Use: No  . Sexual Activity: No   Other Topics Concern  . Not on file   Social History Narrative   She has moved from [place]   She is retired from working with special education    Was married widowed twice.           Past Surgical History  Procedure Laterality Date  . Cesarean section      Family History  Problem Relation Age of Onset  . Clotting disorder Neg Hx   . Cancer Mother     lung non smoker  . Cancer Sister     colon  .  Diabetes Sister   . Diabetes Mother     No Known Allergies  Current Outpatient Prescriptions on File Prior to Visit  Medication Sig Dispense Refill  . albuterol (PROVENTIL HFA;VENTOLIN HFA) 108 (90 BASE) MCG/ACT inhaler Inhale 2 puffs into the lungs every 6 (six) hours as needed for shortness of breath. 3.7 g 3  . cholecalciferol (VITAMIN D) 1000 UNITS tablet Take 1 tablet (1,000 Units total) by mouth daily. 90 tablet 3  . citalopram (CELEXA) 10 MG tablet Take 1 tablet (10 mg total) by mouth daily. (Patient taking differently: Take 20 mg by mouth daily. ) 30 tablet 3  . glucose blood (ONETOUCH VERIO) test strip Test once daily. (Patient taking differently: 1 each by Other route. One to two times daily,) 100 each 5  . HYDROcodone-homatropine (HYCODAN) 5-1.5 MG/5ML syrup Take 5 mLs by mouth every 8 (eight) hours as needed for cough. 120 mL 0  . JANUVIA 100 MG tablet TAKE ONE TABLET BY MOUTH ONCE DAILY 30 tablet 11  . levothyroxine (SYNTHROID, LEVOTHROID) 112 MCG tablet TAKE ONE TABLET BY MOUTH TWICE DAILY  90 tablet 3  . lidocaine-prilocaine (EMLA) cream Apply topically once. 30 each 2  . lisinopril (PRINIVIL,ZESTRIL) 10 MG tablet Take 1 tablet (10 mg total) by mouth daily. 90 tablet 1  . LORazepam (ATIVAN) 0.5 MG tablet Take 1 tablet (0.5 mg total) by mouth every 8 (eight) hours as needed for anxiety (or nausea). Could take 2 tab 30-45 mins prior to PET/CT and MRI Brain 50 tablet 0  . mirtazapine (REMERON) 15 MG tablet TAKE ONE-HALF TABLET (7.5 MG TOTAL) BY MOUTH AT BEDTIME (Patient taking differently: TAKE ONE TABLET (7.5 MG TOTAL) BY MOUTH AT BEDTIME) 30 tablet 6  . ondansetron (ZOFRAN-ODT) 4 MG disintegrating tablet Take 4 mg by mouth every 8 (eight) hours as needed for nausea or vomiting. Reported on 03/15/2015    . ONETOUCH DELICA LANCETS FINE MISC Test once daily. 100 each 5  . oxyCODONE (ROXICODONE) 5 MG/5ML solution Take 5-10 mLs (5-10 mg total) by mouth every 4 (four) hours as needed for moderate pain, severe pain or breakthrough pain. 473 mL 0  . polyethylene glycol (MIRALAX) packet Take 17 g by mouth daily. 30 each 1  . predniSONE (DELTASONE) 10 MG tablet 40 mg x 3 days, 20 mg x 3 days, 10 mg x 3 days. 21 tablet 0  . senna-docusate (SENNA S) 8.6-50 MG tablet Take 2 tablets by mouth at bedtime as needed for mild constipation. 60 tablet 1  . simvastatin (ZOCOR) 20 MG tablet Take 1 tablet (20 mg total) by mouth at bedtime. 90 tablet 3  . vitamin B-12 (CYANOCOBALAMIN) 1000 MCG tablet Take 1 tablet (1,000 mcg total) by mouth daily. 90 tablet 3   Current Facility-Administered Medications on File Prior to Visit  Medication Dose Route Frequency Provider Last Rate Last Dose  . 0.9 %  sodium chloride infusion   Intravenous Once [name], MD      . sodium chloride 0.9 % injection 10 mL  10 mL Intracatheter PRN [name], MD   10 mL at 11/11/14 1131  . sodium chloride 0.9 % injection 10 mL  10 mL Intravenous PRN [name], MD   10 mL at 01/18/15 1527    BP 106/52 mmHg  Ht 5'  2" (1.575 m)  Wt 144 lb 11.2 oz (65.635 kg)  BMI 26.46 kg/m2       Objective:   Physical Exam  Constitutional: She is  oriented to person, place, and time. She appears well-developed and well-nourished. No distress.  HENT:  Head: Normocephalic and atraumatic.  Right Ear: External ear normal.  Left Ear: External ear normal.  Nose: Nose normal.  Mouth/Throat: Oropharynx is clear and moist. No oropharyngeal exudate.  Mild thrush on tongue  Eyes: Conjunctivae and EOM are normal. Pupils are equal, round, and reactive to light. Right eye exhibits no discharge. Left eye exhibits no discharge. No scleral icterus.  Cardiovascular: Normal rate, regular rhythm, normal heart sounds and intact distal pulses.  Exam reveals no gallop and no friction rub.   No murmur heard. Pulmonary/Chest: Effort normal and breath sounds normal. No respiratory distress. She has no wheezes. She has no rales. She exhibits no tenderness.  Lymphadenopathy:    She has no cervical adenopathy.  Neurological: She is alert and oriented to person, place, and time.  Skin: Skin is warm and dry. No rash noted. She is not diaphoretic. No erythema. No pallor.  Psychiatric: She has a normal mood and affect. Her behavior is normal. Judgment and thought content normal.  Nursing note and vitals reviewed.      Assessment & Plan:  1. Controlled type 2 diabetes mellitus with diabetic nephropathy, without long-term current use of insulin (HCC) - Hemoglobin A1c  2. Chronic cough - She is no longer taking lisinopril. Her cough is likely from possible COPD or chronic bronchitis. 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  - Advised Mucinex or Delsym as needed  3. Thrush - nystatin (MYCOSTATIN) 100000 UNIT/ML suspension; Take 5 mLs (500,000 Units total) by mouth 4 (four) times daily.  Dispense: 60 mL; Refill: 0  4. Other fatigue - Hemoglobin A1c - TSH - Vitamin B12 - Vitamin D, 25-hydroxy - Hepatic function panel -  CBC with Differential/Platelet - I would like her to get out of her room every day and go outside to take a walk around the neighborhood.  - Advised to inform family that she does not like fried foods and would like to eat more healthy.  - She has not lost any weight.   5. Depression - She is taking Celexa 20 mg and Remeron 30 mg. I believe this is helping her. I do not necessarily think she is depressed. There does appear to be communication issues at home. She reports that she feels like a burden. There is also some form of a anxiety component as she has expressed concern that " something might have been missed on the MRI's and CT's." - Continue with current medication regimen.  - PHQ 9 score 2 - List of local psychiatrists given to patient. I do think it would be a good idea for her to see a therapist as well as have some family therapy. She agrees with this plan.   6. Cognitive impairment Mini Mental Score 26. She had trouble with word recall and copying exercise. Not concerned for cognitive impairment at this time.  - Will consider neurology consult in the future but it is not needed now.    [name], NP

## 2015-06-21 NOTE — Patient Instructions (Signed)
It was great seeing you again today.   I will follow up with you regarding your lab results.   I want you to stop taking your lisinopril for the next two days. If the cough goes away, please let me know. If it does not go away, please restart the lisinopril.   Get Mucinex for the cough   Also get Biotene mouthwash and use at night to help with dry mouth  I have sent in Nystatin liquid to help get rid of the thrush on your tongue. Use as directed until cleared.   I want you to get outside and start going to walks daily.

## 2015-06-22 ENCOUNTER — Telehealth: Payer: Self-pay | Admitting: Adult Health

## 2015-06-22 DIAGNOSIS — D72829 Elevated white blood cell count, unspecified: Secondary | ICD-10-CM

## 2015-06-22 NOTE — Telephone Encounter (Signed)
Spoke to patients daughter in law Langley Gauss) and informed her of labs.   1. And an Iron and Vitamin C supplement. To daily regimen  2. Continue with Vitamin D 3. Take Synthroid once daily in the morning on an empty stomach 4. I would like to do a follow up CBC with Diff in a week 5. Repeat TSH in 6 weeks

## 2015-07-03 ENCOUNTER — Ambulatory Visit (HOSPITAL_COMMUNITY)
Admission: RE | Admit: 2015-07-03 | Discharge: 2015-07-03 | Disposition: A | Discharge status: Home / Self Care | Payer: Medicare Other | Source: Ambulatory Visit | Attending: Radiation Oncology | Admitting: Radiation Oncology

## 2015-07-03 ENCOUNTER — Other Ambulatory Visit (INDEPENDENT_AMBULATORY_CARE_PROVIDER_SITE_OTHER): Payer: Medicare Other

## 2015-07-03 DIAGNOSIS — C7931 Secondary malignant neoplasm of brain: Secondary | ICD-10-CM | POA: Insufficient documentation

## 2015-07-03 DIAGNOSIS — D72829 Elevated white blood cell count, unspecified: Secondary | ICD-10-CM | POA: Diagnosis not present

## 2015-07-03 LAB — CBC WITH DIFFERENTIAL/PLATELET
BASOS ABS: 0 10*3/uL (ref 0.0–0.1)
BASOS PCT: 0.3 % (ref 0.0–3.0)
Eosinophils Absolute: 0.1 10*3/uL (ref 0.0–0.7)
Eosinophils Relative: 1.5 % (ref 0.0–5.0)
HEMATOCRIT: 33.4 % — AB (ref 36.0–46.0)
Hemoglobin: 11 g/dL — ABNORMAL LOW (ref 12.0–15.0)
LYMPHS PCT: 14.5 % (ref 12.0–46.0)
Lymphs Abs: 1.1 10*3/uL (ref 0.7–4.0)
MCHC: 32.9 g/dL (ref 30.0–36.0)
MCV: 91.9 fl (ref 78.0–100.0)
MONOS PCT: 8.7 % (ref 3.0–12.0)
Monocytes Absolute: 0.6 10*3/uL (ref 0.1–1.0)
NEUTROS ABS: 5.5 10*3/uL (ref 1.4–7.7)
Neutrophils Relative %: 75 % (ref 43.0–77.0)
PLATELETS: 224 10*3/uL (ref 150.0–400.0)
RBC: 3.64 Mil/uL — ABNORMAL LOW (ref 3.87–5.11)
RDW: 18 % — ABNORMAL HIGH (ref 11.5–15.5)
WBC: 7.3 10*3/uL (ref 4.0–10.5)

## 2015-07-03 MED ORDER — GADOBENATE DIMEGLUMINE 529 MG/ML IV SOLN
14.0000 mL | Freq: Once | INTRAVENOUS | Status: AC | PRN
  Administered 2015-07-03: 14 mL via INTRAVENOUS

## 2015-07-04 NOTE — Progress Notes (Signed)
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 Risk of brain metastases warranting prophylactic brain radiation 198.3 C79.31 T4, free     TSH     Ambulatory referral to Social Work  2. Chronic fatigue 780.79 R53.82 T4, free     TSH     Ambulatory referral to Social Work     Indication for treatment: Curative  Radiation treatment dates: 03/20/2015-03/31/2015  Site/dose: Whole Brain treated to 25 Gy in 10 fractions    Indication for treatment: Curative with concurrent chemotherapy  Radiation treatment dates:   11/07/2014-12/16/2014 Site/dose: Right Lung. 60 Gy in 30 fractions.   Narrative:  The patient returns today for routine follow-up.  MRI brain on 07/03/15 showed minimal left superior frontal enhancement remains stable since 10/19/2014 and is likely vascular. No findings for intracranial metastasis. CT CAP on 4-24 were negative for recurrent of progressive disease - she did have radiation change in the right hemithorax.  Her PCP recently put her on a prednisone taper for bronchitis. This improved her SOB somewhat but she still has a wet cough.  She is very tired.  TSH was recently LOW.  PCP Rx's levothyroxine for her.  Does report some forgetfulness, blurred vision, and sometimes slurred speech.  No focal neurologic complaints.  She spends a lot of time at home and in bed. No HAs    ALLERGIES:  has No Known Allergies.  Meds: Current Outpatient Prescriptions  Medication Sig Dispense Refill  . cholecalciferol (VITAMIN D) 1000 UNITS tablet Take 1 tablet (1,000 Units total) by mouth daily. 90 tablet 3  . glucose blood (ONETOUCH VERIO) test  strip Test once daily. (Patient taking differently: 1 each by Other route. One to two times daily,) 100 each 5  . Iron Combinations (CHROMAGEN) capsule Take 1 capsule by mouth daily.    Marland Kitchen JANUVIA 100 MG tablet TAKE ONE TABLET BY MOUTH ONCE DAILY 30 tablet 11  . levothyroxine (SYNTHROID, LEVOTHROID) 112 MCG tablet TAKE ONE TABLET BY MOUTH TWICE DAILY 90 tablet 3  . lisinopril (PRINIVIL,ZESTRIL) 10 MG tablet Take 1 tablet (10 mg total) by mouth daily. 90 tablet 1  . mirtazapine (REMERON) 15 MG tablet TAKE ONE-HALF TABLET (7.5 MG TOTAL) BY MOUTH AT BEDTIME (Patient taking differently: TAKE ONE TABLET (7.5 MG TOTAL) BY MOUTH AT BEDTIME) 30 tablet 6  . ONETOUCH DELICA LANCETS FINE MISC Test once daily. 100 each 5  . prenatal vitamin w/FE, FA (NATACHEW) 29-1 MG CHEW chewable tablet Chew 1 tablet by mouth daily at 12 noon.    . simvastatin (ZOCOR) 20 MG tablet Take 1 tablet (20 mg total) by mouth at bedtime. 90 tablet 3  . vitamin B-12 (CYANOCOBALAMIN) 1000 MCG tablet Take 1 tablet (1,000 mcg total) by mouth daily. 90 tablet 3  . albuterol (PROVENTIL HFA;VENTOLIN HFA) 108 (90 BASE) MCG/ACT inhaler Inhale 2 puffs into the lungs every 6 (six) hours as needed for shortness of breath. (Patient not taking: Reported on 07/05/2015) 3.7 g 3  . citalopram (CELEXA) 10 MG tablet Take 2 tablets (20 mg total) by mouth daily. 60 tablet 3  .  HYDROcodone-homatropine (HYCODAN) 5-1.5 MG/5ML syrup Take 5 mLs by mouth every 8 (eight) hours as needed for cough. (Patient not taking: Reported on 07/05/2015) 120 mL 0  . lidocaine-prilocaine (EMLA) cream Apply topically once. (Patient not taking: Reported on 07/05/2015) 30 each 2  . LORazepam (ATIVAN) 0.5 MG tablet Take 1 tablet (0.5 mg total) by mouth every 8 (eight) hours as needed for anxiety (or nausea). Could take 2 tab 30-45 mins prior to PET/CT and MRI Brain 50 tablet 0  . nystatin (MYCOSTATIN) 100000 UNIT/ML suspension Take 5 mLs (500,000 Units total) by mouth 4 (four) times  daily. (Patient not taking: Reported on 07/05/2015) 60 mL 0  . ondansetron (ZOFRAN-ODT) 4 MG disintegrating tablet Take 4 mg by mouth every 8 (eight) hours as needed for nausea or vomiting. Reported on 07/05/2015    . oxyCODONE (ROXICODONE) 5 MG/5ML solution Take 5-10 mLs (5-10 mg total) by mouth every 4 (four) hours as needed for moderate pain, severe pain or breakthrough pain. (Patient not taking: Reported on 07/05/2015) 473 mL 0  . polyethylene glycol (MIRALAX) packet Take 17 g by mouth daily. (Patient not taking: Reported on 07/05/2015) 30 each 1  . senna-docusate (SENNA S) 8.6-50 MG tablet Take 2 tablets by mouth at bedtime as needed for mild constipation. 60 tablet 1   No current facility-administered medications for this encounter.   Facility-Administered Medications Ordered in Other Encounters  Medication Dose Route Frequency Provider Last Rate Last Dose  . sodium chloride 0.9 % injection 10 mL  10 mL Intracatheter PRN [name], MD   10 mL at 11/11/14 1131  . sodium chloride 0.9 % injection 10 mL  10 mL Intravenous PRN [name], MD   10 mL at 01/18/15 1527    Physical Findings: The patient is in no acute distress. Patient is alert and oriented.  height is '5\' 2"'$  (1.575 m) and weight is 149 lb 9.6 oz (67.858 kg). Her oral temperature is 98.1 F (36.7 C). Her blood pressure is 115/62 and her pulse is 107. Her respiration is 16 and oxygen saturation is 93%. Marland Kitchen   HENT: Oral cavity is clear without thrush. Neck  No masses Heart RRR Chest CTAB Neurologic: no focalities Ext: no edema   Lab Findings: Lab Results  Component Value Date   WBC 7.3 07/03/2015   HGB 11.0* 07/03/2015   HCT 33.4* 07/03/2015   MCV 91.9 07/03/2015   PLT 224.0 07/03/2015       Radiographic Findings: Mr Jeri Cos NK Contrast  07/03/2015  CLINICAL DATA:  SRS protocol. History of metastatic lung cancer status post prophylactic whole-brain radiotherapy. Followup to differentiate metastasis from  vascular malformation. EXAM: MRI HEAD WITHOUT AND WITH CONTRAST TECHNIQUE: Multiplanar, multiecho pulse sequences of the brain and surrounding structures were obtained without and with intravenous contrast. CONTRAST:  39m MULTIHANCE GADOBENATE DIMEGLUMINE 529 MG/ML IV SOLN COMPARISON:  06/05/2015 and earlier FINDINGS: Calvarium and upper cervical spine: No focal marrow signal abnormality. C4-5 facet arthropathy with anterolisthesis. Orbits: Bilateral cataract resection.  Negative for orbital mass. Sinuses and Mastoids: Bilateral mastoid fluid and/or mucosal thickening, greater on the left. There is clear nasopharynx. Mild mucosal thickening in the paranasal sinuses. Brain: Stable wispy enhancement in the cortical/ subcortical superior left frontal lobe 10:114. 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 There is no nodular enhancement concerning for metastatic disease. Confluent cerebral white matter FLAIR hyperintensity that is hazy, chronic microvascular ischemia/whole-brain radiotherapy  effects. No acute or interval infarct. No hemorrhage, hydrocephalus, or major vessel occlusion. IMPRESSION: Minimal left superior frontal enhancement remains stable since 10/19/2014 and is likely vascular. No findings for intracranial metastasis. Electronically Signed   By: Monte Fantasia M.D.   On: 07/03/2015 17:08   Mr Jeri Cos XA Contrast  06/05/2015  CLINICAL DATA:  Small cell lung cancer.  Chemo radiation completed. EXAM: MRI HEAD WITHOUT AND WITH CONTRAST TECHNIQUE: Multiplanar, multiecho pulse sequences of the brain and surrounding structures were obtained without and with intravenous contrast. CONTRAST:  76m MULTIHANCE GADOBENATE DIMEGLUMINE 529 MG/ML IV SOLN COMPARISON:  MRI 03/06/2015, 10/19/2014 FINDINGS: Mild atrophy unchanged.  Negative for hydrocephalus. 6 mm extra-axial nodule in the left occipital parietal lobe is stable. This  does not enhance but may represent a calcified meningioma. Negative for acute infarct. Extensive chronic white matter changes appear progressive and likely related to chemo/ radiation. Chronic changes also in the pons which are mild and progressive. Negative for acute infarct. Small chronic micro hemorrhage left inferior cerebellar tonsil unchanged from prior studies. Postcontrast imaging reveals ill-defined enhancement in the left superior frontal gyrus unchanged from 10/19/2014. This is not seen on the more recent study. 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 No other areas of abnormal enhancement. Negative for metastatic disease to the brain. Calvarium intact. Leptomeningeal enhancement normal. Mild mucosal edema paranasal sinuses. Mucosal edema left mastoid tip. IMPRESSION: Progressive white matter changes likely due to chemo and radiation. Whole-brain radiation completed 03/31/2015. Negative for acute infarct. 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 No intracranial metastatic disease. Electronically Signed   By: CFranchot GalloM.D.   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  She may have an element of pneumonitis in her lungs.  Since she already benefited from a prednisone taper I will not Rx more steroids today.  If her SOB worsens, a pulmonary consult could always be considered.  They know to call if issues arise.  We will arrange a follow up Brain MRI in 4 months and I will see her soon after. Ordering a Free T4 and TSH to determine if fatigue is due to pituitary suppression and thus low thyroid levels.  _____________________________________   SEppie Gibson MD

## 2015-07-05 ENCOUNTER — Other Ambulatory Visit: Payer: Self-pay | Admitting: *Deleted

## 2015-07-05 ENCOUNTER — Ambulatory Visit
Admission: RE | Admit: 2015-07-05 | Discharge: 2015-07-05 | Disposition: A | Discharge status: Home / Self Care | Payer: Medicare Other | Source: Ambulatory Visit | Attending: Radiation Oncology | Admitting: Radiation Oncology

## 2015-07-05 ENCOUNTER — Encounter: Payer: Self-pay | Admitting: Radiation Oncology

## 2015-07-05 VITALS — BP 115/62 | HR 107 | Temp 98.1°F | Resp 16 | Ht 62.0 in | Wt 149.6 lb

## 2015-07-05 DIAGNOSIS — C7931 Secondary malignant neoplasm of brain: Secondary | ICD-10-CM

## 2015-07-05 DIAGNOSIS — Q282 Arteriovenous malformation of cerebral vessels: Secondary | ICD-10-CM | POA: Diagnosis not present

## 2015-07-05 DIAGNOSIS — R5383 Other fatigue: Secondary | ICD-10-CM | POA: Insufficient documentation

## 2015-07-05 DIAGNOSIS — R4781 Slurred speech: Secondary | ICD-10-CM | POA: Diagnosis not present

## 2015-07-05 DIAGNOSIS — C3490 Malignant neoplasm of unspecified part of unspecified bronchus or lung: Secondary | ICD-10-CM | POA: Insufficient documentation

## 2015-07-05 DIAGNOSIS — Z923 Personal history of irradiation: Secondary | ICD-10-CM | POA: Insufficient documentation

## 2015-07-05 DIAGNOSIS — R0602 Shortness of breath: Secondary | ICD-10-CM | POA: Diagnosis not present

## 2015-07-05 DIAGNOSIS — Z79899 Other long term (current) drug therapy: Secondary | ICD-10-CM | POA: Insufficient documentation

## 2015-07-05 DIAGNOSIS — Z760 Encounter for issue of repeat prescription: Secondary | ICD-10-CM

## 2015-07-05 DIAGNOSIS — R5382 Chronic fatigue, unspecified: Secondary | ICD-10-CM

## 2015-07-05 LAB — TSH: TSH: 3.041 m[IU]/L (ref 0.308–3.960)

## 2015-07-05 MED ORDER — CITALOPRAM HYDROBROMIDE 10 MG PO TABS: 20.0000 mg | ORAL_TABLET | Freq: Every day | ORAL | Status: DC

## 2015-07-05 MED ORDER — LORAZEPAM 0.5 MG PO TABS: 0.5000 mg | ORAL_TABLET | Freq: Three times a day (TID) | ORAL | Status: DC | PRN

## 2015-07-05 NOTE — Progress Notes (Addendum)
Mrs. Jenniges is here today for a follow up visit to receive MRI brain results. Appetite is good.  Having fatigue most of the day.   Forgetful all the time.  Has blurred vision sometimes and slurred speech.  Denies any problems with fine motor skills and headaches. BP 115/62 mmHg  Pulse 107  Temp(Src) 98.1 F (36.7 C) (Oral)  Resp 16  Ht '5\' 2"'$  (1.575 m)  Wt 149 lb 9.6 oz (67.858 kg)  BMI 27.36 kg/m2  SpO2 93%

## 2015-07-06 ENCOUNTER — Telehealth: Payer: Self-pay | Admitting: Adult Health

## 2015-07-06 LAB — T4, FREE: T4,Free(Direct): 1.14 ng/dL (ref 0.82–1.77)

## 2015-07-06 NOTE — Addendum Note (Signed)
Encounter addended by: Ernst Spell, RN on: 07/06/2015  9:21 AM<BR>     Documentation filed: Charges VN

## 2015-07-06 NOTE — Telephone Encounter (Signed)
I spoke with Langley Gauss, Ms. Bossard's daughter-in-law, to let them know that the patient's thyroid function panel was normal (both the TSH and free T4).  I let her know that we did not need to make any adjustments in her Synthroid for now, but we would continue to monitor the thyroid over time with subsequent TSH blood tests.  She thanked me for calling and I encouraged her to call us with any other questions or concerns.    Mike Craze, NP [place] [phone]

## 2015-07-19 ENCOUNTER — Encounter: Payer: Self-pay | Admitting: *Deleted

## 2015-07-19 NOTE — Progress Notes (Signed)
[place] Work  Clinical Social Work was referred by Pension scheme manager for assessment of psychosocial needs due to progression of cancer.  Clinical Social Worker contacted patient at home to offer support and assess for needs. CSW introduced self and explained role of CSW. Pt reports to live with her daughter in law, son and grand daughter. She reports to have good support in place and denied current concerns. Pt is open to CSW check in at next visit and agrees to reach out to CSW as needed if needs arise.     Clinical Social Work interventions: Resource education  Loren Racer, [place] Worker [place]  [place] Phone: [phone] Fax: [phone]

## 2015-07-28 ENCOUNTER — Ambulatory Visit (HOSPITAL_BASED_OUTPATIENT_CLINIC_OR_DEPARTMENT_OTHER): Payer: Medicare Other

## 2015-07-28 DIAGNOSIS — C3490 Malignant neoplasm of unspecified part of unspecified bronchus or lung: Secondary | ICD-10-CM | POA: Diagnosis not present

## 2015-07-28 DIAGNOSIS — Z452 Encounter for adjustment and management of vascular access device: Secondary | ICD-10-CM | POA: Diagnosis present

## 2015-07-28 MED ORDER — HEPARIN SOD (PORK) LOCK FLUSH 100 UNIT/ML IV SOLN
500.0000 [IU] | Freq: Once | INTRAVENOUS | Status: AC | PRN
  Administered 2015-07-28: 500 [IU]
  Filled 2015-07-28: qty 5

## 2015-07-28 MED ORDER — SODIUM CHLORIDE 0.9 % IJ SOLN
10.0000 mL | INTRAMUSCULAR | Status: DC | PRN
  Administered 2015-07-28: 10 mL
  Filled 2015-07-28: qty 10

## 2015-07-28 NOTE — Patient Instructions (Signed)

## 2015-08-03 ENCOUNTER — Telehealth: Payer: Self-pay | Admitting: *Deleted

## 2015-08-03 ENCOUNTER — Other Ambulatory Visit: Payer: Self-pay | Admitting: *Deleted

## 2015-08-03 DIAGNOSIS — C7949 Secondary malignant neoplasm of other parts of nervous system: Principal | ICD-10-CM

## 2015-08-03 DIAGNOSIS — C7931 Secondary malignant neoplasm of brain: Secondary | ICD-10-CM

## 2015-08-03 NOTE — Telephone Encounter (Signed)
1.  Do you need a wheel chair?  no  2. On oxygen? no  3. Have you ever had any surgery in the body part being scanned?  no  4. Have you ever had any surgery on your brain or heart?   no                    5. Have you ever had surgery on your eyes or ears?     no                         6. Do you have a pacemaker or defibrillator?  NO  7. Do you have a Neurostimulator?  NO  8. Claustrophobic?  YES  9. Any risk for metal in eyes? NO  10. Injury by bullet, buckshot, or shrapnel?  NO  11. Stent?  NO                                                                                                                 12. Hx of Cancer? YES LUNG  13. Kidney or Liver disease?  NO  14. Hx of Lupus, Rheumatoid Arthritis or Scleroderma? NO  15. IV Antibiotics or long term use of NSAIDS?     14. HX of Hypertension?  NO  17. Diabetes?  YES  18. Allergy to contrast? NO

## 2015-08-17 ENCOUNTER — Ambulatory Visit (INDEPENDENT_AMBULATORY_CARE_PROVIDER_SITE_OTHER): Payer: Medicare Other | Admitting: Podiatry

## 2015-08-17 ENCOUNTER — Encounter: Payer: Self-pay | Admitting: Podiatry

## 2015-08-17 DIAGNOSIS — E119 Type 2 diabetes mellitus without complications: Secondary | ICD-10-CM

## 2015-08-17 DIAGNOSIS — M79676 Pain in unspecified toe(s): Secondary | ICD-10-CM

## 2015-08-17 DIAGNOSIS — M79609 Pain in unspecified limb: Principal | ICD-10-CM

## 2015-08-17 DIAGNOSIS — B351 Tinea unguium: Secondary | ICD-10-CM

## 2015-08-17 NOTE — Progress Notes (Signed)
   Subjective:    Patient ID: Rachel Chapman, female    DOB: [birth year], 75 y.o.   MRN: [id number]  HPI This patient presents to the office for treatment of her nails.  She says she has moved from [facility] for treatment and her nails are painful walking and wearing her shoes.  She is diabetic with no foot complications.  She presents for preventive foot care services. The patient presents here today for B/L toenail debridement..    Review of Systems  HENT: Positive for hearing loss.   Eyes: Positive for visual disturbance.  Respiratory: Positive for cough, shortness of breath and wheezing.   Gastrointestinal: Positive for nausea, vomiting, diarrhea and constipation.  Musculoskeletal: Positive for gait problem.  Neurological: Positive for dizziness and light-headedness.       Objective:   Physical Exam GENERAL APPEARANCE: Alert, conversant. Appropriately groomed. No acute distress.  VASCULAR: Pedal pulses palpable at  [facility] and PT bilateral.  Capillary refill time is immediate to all digits,  Normal temperature gradient.  Digital hair growth is present bilateral  NEUROLOGIC: sensation is normal to 5.07 monofilament at 5/5 sites bilateral.  Light touch is intact bilateral, Muscle strength normal.  MUSCULOSKELETAL: acceptable muscle strength, tone and stability bilateral.  Intrinsic muscluature intact bilateral.  Rectus appearance of foot and digits noted bilateral.   DERMATOLOGIC: skin color, texture, and turgor are within normal limits.  No preulcerative lesions or ulcers  are seen, no interdigital maceration noted.  No open lesions present.  . No drainage noted.  Nails  Thick disfigured discolored nails both feet with no infections or ulcers.        Assessment & Plan:  Onychomycosis  IE  Debridement and grinding of long thick nails. RTC 10 weeks   Gardiner Barefoot DPM

## 2015-08-18 ENCOUNTER — Other Ambulatory Visit: Payer: Self-pay | Admitting: Hematology

## 2015-08-21 ENCOUNTER — Other Ambulatory Visit: Payer: Self-pay | Admitting: *Deleted

## 2015-08-21 DIAGNOSIS — C3490 Malignant neoplasm of unspecified part of unspecified bronchus or lung: Secondary | ICD-10-CM

## 2015-08-21 MED ORDER — LORAZEPAM 0.5 MG PO TABS: 0.5000 mg | ORAL_TABLET | Freq: Three times a day (TID) | ORAL | Status: DC | PRN

## 2015-08-23 ENCOUNTER — Other Ambulatory Visit: Payer: Self-pay | Admitting: *Deleted

## 2015-08-29 ENCOUNTER — Telehealth: Payer: Self-pay | Admitting: Hematology

## 2015-08-29 NOTE — Telephone Encounter (Signed)
Per 7/12 pof moved 8/7 lab/flush to 8/3 before ct that day. Spoke with patient re 8/3 and 8/7 appointments.

## 2015-09-14 ENCOUNTER — Other Ambulatory Visit (HOSPITAL_BASED_OUTPATIENT_CLINIC_OR_DEPARTMENT_OTHER): Payer: Medicare Other

## 2015-09-14 ENCOUNTER — Ambulatory Visit: Payer: Medicare Other

## 2015-09-14 ENCOUNTER — Ambulatory Visit (HOSPITAL_COMMUNITY)
Admission: RE | Admit: 2015-09-14 | Discharge: 2015-09-14 | Disposition: A | Discharge status: Home / Self Care | Payer: Medicare Other | Source: Ambulatory Visit | Attending: Hematology | Admitting: Hematology

## 2015-09-14 DIAGNOSIS — Y842 Radiological procedure and radiotherapy as the cause of abnormal reaction of the patient, or of later complication, without mention of misadventure at the time of the procedure: Secondary | ICD-10-CM | POA: Insufficient documentation

## 2015-09-14 DIAGNOSIS — C3490 Malignant neoplasm of unspecified part of unspecified bronchus or lung: Secondary | ICD-10-CM

## 2015-09-14 DIAGNOSIS — R590 Localized enlarged lymph nodes: Secondary | ICD-10-CM | POA: Diagnosis not present

## 2015-09-14 DIAGNOSIS — C3491 Malignant neoplasm of unspecified part of right bronchus or lung: Secondary | ICD-10-CM

## 2015-09-14 LAB — CBC & DIFF AND RETIC
BASO%: 0.4 % (ref 0.0–2.0)
BASOS ABS: 0 10*3/uL (ref 0.0–0.1)
EOS ABS: 0.2 10*3/uL (ref 0.0–0.5)
EOS%: 3.1 % (ref 0.0–7.0)
HCT: 34.3 % — ABNORMAL LOW (ref 34.8–46.6)
HGB: 11.8 g/dL (ref 11.6–15.9)
Immature Retic Fract: 8.4 % (ref 1.60–10.00)
LYMPH#: 1.2 10*3/uL (ref 0.9–3.3)
LYMPH%: 15.7 % (ref 14.0–49.7)
MCH: 32.8 pg (ref 25.1–34.0)
MCHC: 34.4 g/dL (ref 31.5–36.0)
MCV: 95.3 fL (ref 79.5–101.0)
MONO#: 1.1 10*3/uL — ABNORMAL HIGH (ref 0.1–0.9)
MONO%: 14.6 % — AB (ref 0.0–14.0)
NEUT%: 66.2 % (ref 38.4–76.8)
NEUTROS ABS: 5.2 10*3/uL (ref 1.5–6.5)
Platelets: 165 10*3/uL (ref 145–400)
RBC: 3.6 10*6/uL — AB (ref 3.70–5.45)
RDW: 14.5 % (ref 11.2–14.5)
RETIC %: 2.42 % — AB (ref 0.70–2.10)
RETIC CT ABS: 87.12 10*3/uL (ref 33.70–90.70)
WBC: 7.8 10*3/uL (ref 3.9–10.3)

## 2015-09-14 LAB — COMPREHENSIVE METABOLIC PANEL
ALK PHOS: 94 U/L (ref 40–150)
ALT: 36 U/L (ref 0–55)
AST: 24 U/L (ref 5–34)
Albumin: 3.7 g/dL (ref 3.5–5.0)
Anion Gap: 11 mEq/L (ref 3–11)
BILIRUBIN TOTAL: 0.38 mg/dL (ref 0.20–1.20)
BUN: 27.6 mg/dL — ABNORMAL HIGH (ref 7.0–26.0)
CHLORIDE: 104 meq/L (ref 98–109)
CO2: 22 mEq/L (ref 22–29)
Calcium: 9.4 mg/dL (ref 8.4–10.4)
Creatinine: 1.4 mg/dL — ABNORMAL HIGH (ref 0.6–1.1)
EGFR: 35 mL/min/{1.73_m2} — AB (ref >90)
Glucose: 111 mg/dl (ref 70–140)
POTASSIUM: 4.5 meq/L (ref 3.5–5.1)
SODIUM: 137 meq/L (ref 136–145)
Total Protein: 7.2 g/dL (ref 6.4–8.3)

## 2015-09-14 LAB — LACTATE DEHYDROGENASE: LDH: 204 U/L (ref 125–245)

## 2015-09-14 MED ORDER — DIATRIZOATE MEGLUMINE & SODIUM 66-10 % PO SOLN
30.0000 mL | Freq: Once | ORAL | Status: AC
  Administered 2015-09-14: 30 mL via ORAL

## 2015-09-14 MED ORDER — IOPAMIDOL (ISOVUE-300) INJECTION 61%
100.0000 mL | Freq: Once | INTRAVENOUS | Status: AC | PRN
  Administered 2015-09-14: 80 mL via INTRAVENOUS

## 2015-09-14 NOTE — Progress Notes (Signed)
Patient accessed with Power Port 20g, labs drawn and walked to lab. Patient will be going for a CT scan this morning. Pt had no complaints.

## 2015-09-14 NOTE — Patient Instructions (Signed)

## 2015-09-18 ENCOUNTER — Other Ambulatory Visit: Payer: Medicare Other

## 2015-09-18 ENCOUNTER — Encounter: Payer: Self-pay | Admitting: Hematology

## 2015-09-18 ENCOUNTER — Telehealth: Payer: Self-pay | Admitting: Hematology

## 2015-09-18 ENCOUNTER — Ambulatory Visit (HOSPITAL_BASED_OUTPATIENT_CLINIC_OR_DEPARTMENT_OTHER): Payer: Medicare Other | Admitting: Hematology

## 2015-09-18 ENCOUNTER — Other Ambulatory Visit: Payer: Self-pay | Admitting: *Deleted

## 2015-09-18 VITALS — BP 104/61 | HR 99 | Temp 98.3°F | Resp 17 | Ht 62.0 in | Wt 152.5 lb

## 2015-09-18 DIAGNOSIS — E039 Hypothyroidism, unspecified: Secondary | ICD-10-CM

## 2015-09-18 DIAGNOSIS — C3490 Malignant neoplasm of unspecified part of unspecified bronchus or lung: Secondary | ICD-10-CM

## 2015-09-18 DIAGNOSIS — E119 Type 2 diabetes mellitus without complications: Secondary | ICD-10-CM

## 2015-09-18 DIAGNOSIS — C3491 Malignant neoplasm of unspecified part of right bronchus or lung: Secondary | ICD-10-CM

## 2015-09-18 NOTE — Telephone Encounter (Signed)
Gave pt cal & avs °

## 2015-09-23 NOTE — Progress Notes (Signed)
Marland Kitchen  HEMATOLOGY ONCOLOGY PROGRESS NOTE  Date of service: .09/18/2015   Patient Care Team: [name], NP as PCP - General (Family Medicine)  Diagnosis: Limited stage small cell lung cancer  Current Treatment: monitoring  Completed treatment  Status post 5 cycles of carboplatin and etoposide Status post Concurrent radiation therapy completed 12/16/2014.  Prophylactic cranial radiation therapy  INTERVAL HISTORY:  Patient is here for her scheduled follow-up for her small cell lung cancer with her son and daughter. Notes no acute new symptoms. No change in breathing. No headaches or new FND. Had MRI brain in 07/03/2015 with no evidence of intracranial metastases. CT chest/abd/pelvis done on 09/14/2015 - new mild paratracheal and prevascular adenopathy with increasing consolidation surrounding the rt hilum which may represent progressive radiation change. No evidence of metastatic disease in the abdomen/pelvis. Patient notes depression and anxiety under control but that she does not have much interest in doing anything specific.   REVIEW OF SYSTEMS:    10 Point review of systems of done and is negative except as noted above.  . Past Medical History:  Diagnosis Date  . Anxiety   . Cancer ([place])   . Diabetes ([place])   . Dyslipidemia   . Hypertension   . Hypothyroidism   . Lung cancer ([place]) 8/216   hilar/mediastinum lung scca  . Pneumonia 2007  . Smoker    1.5 packs per day for 60 years.  Quit one month ago    . Past Surgical History:  Procedure Laterality Date  . CESAREAN SECTION      . Social History  Substance Use Topics  . Smoking status: Former Smoker    Packs/day: 1.50    Years: 60.00    Quit date: 09/12/2014  . Smokeless tobacco: Not on file  . Alcohol use No    ALLERGIES:  has No Known Allergies.  MEDICATIONS:  Current Outpatient Prescriptions  Medication Sig Dispense Refill  . albuterol (PROVENTIL HFA;VENTOLIN HFA) 108 (90 BASE) MCG/ACT inhaler Inhale 2  puffs into the lungs every 6 (six) hours as needed for shortness of breath. 3.7 g 3  . cholecalciferol (VITAMIN D) 1000 UNITS tablet Take 1 tablet (1,000 Units total) by mouth daily. 90 tablet 3  . citalopram (CELEXA) 10 MG tablet Take 2 tablets (20 mg total) by mouth daily. 60 tablet 3  . glucose blood (ONETOUCH VERIO) test strip Test once daily. (Patient taking differently: 1 each by Other route. One to two times daily,) 100 each 5  . HYDROcodone-homatropine (HYCODAN) 5-1.5 MG/5ML syrup Take 5 mLs by mouth every 8 (eight) hours as needed for cough. 120 mL 0  . Iron Combinations (CHROMAGEN) capsule Take 1 capsule by mouth daily.    Marland Kitchen JANUVIA 100 MG tablet TAKE ONE TABLET BY MOUTH ONCE DAILY 30 tablet 11  . levothyroxine (SYNTHROID, LEVOTHROID) 112 MCG tablet TAKE ONE TABLET BY MOUTH TWICE DAILY 90 tablet 3  . lidocaine-prilocaine (EMLA) cream Apply topically once. 30 each 2  . lisinopril (PRINIVIL,ZESTRIL) 10 MG tablet Take 1 tablet (10 mg total) by mouth daily. 90 tablet 1  . LORazepam (ATIVAN) 0.5 MG tablet Take 1 tablet (0.5 mg total) by mouth every 8 (eight) hours as needed for anxiety (or nausea). Could take 2 tab 30-45 mins prior to PET/CT and MRI Brain 50 tablet 0  . mirtazapine (REMERON) 15 MG tablet TAKE ONE-HALF TABLET (7.5 MG TOTAL) BY MOUTH AT BEDTIME (Patient taking differently: TAKE ONE TABLET (7.5 MG TOTAL) BY MOUTH AT BEDTIME) 30 tablet  6  . nystatin (MYCOSTATIN) 100000 UNIT/ML suspension Take 5 mLs (500,000 Units total) by mouth 4 (four) times daily. 60 mL 0  . ondansetron (ZOFRAN-ODT) 4 MG disintegrating tablet Take 4 mg by mouth every 8 (eight) hours as needed for nausea or vomiting. Reported on 07/05/2015    . ONETOUCH DELICA LANCETS FINE MISC Test once daily. 100 each 5  . oxyCODONE (ROXICODONE) 5 MG/5ML solution Take 5-10 mLs (5-10 mg total) by mouth every 4 (four) hours as needed for moderate pain, severe pain or breakthrough pain. 473 mL 0  . polyethylene glycol (MIRALAX)  packet Take 17 g by mouth daily. 30 each 1  . prenatal vitamin w/FE, FA (NATACHEW) 29-1 MG CHEW chewable tablet Chew 1 tablet by mouth daily at 12 noon.    . senna-docusate (SENNA S) 8.6-50 MG tablet Take 2 tablets by mouth at bedtime as needed for mild constipation. 60 tablet 1  . simvastatin (ZOCOR) 20 MG tablet Take 1 tablet (20 mg total) by mouth at bedtime. 90 tablet 3  . vitamin B-12 (CYANOCOBALAMIN) 1000 MCG tablet Take 1 tablet (1,000 mcg total) by mouth daily. 90 tablet 3   No current facility-administered medications for this visit.    Facility-Administered Medications Ordered in Other Visits  Medication Dose Route Frequency Provider Last Rate Last Dose  . sodium chloride 0.9 % injection 10 mL  10 mL Intracatheter PRN [name], MD   10 mL at 11/11/14 1131  . sodium chloride 0.9 % injection 10 mL  10 mL Intravenous PRN [name], MD   10 mL at 01/18/15 1527    PHYSICAL EXAMINATION: ECOG PERFORMANCE STATUS: 1 - Symptomatic but completely ambulatory  . Vitals:   09/18/15 0911  BP: 104/61  Pulse: 99  Resp: 17  Temp: 98.3 F (36.8 C)    Filed Weights   09/18/15 0911  Weight: 152 lb 8 oz (69.2 kg)   .Body mass index is 27.89 kg/m.  GENERAL:alert, in no acute distress and comfortable SKIN: skin color, texture, turgor are normal, no rashes or significant lesions EYES: normal, conjunctiva are pink and non-injected, sclera clear OROPHARYNX:no exudate, no erythema and lips, buccal mucosa, and tongue normal  NECK: supple, no JVD, thyroid normal size, non-tender, without nodularity LYMPH:  no palpable lymphadenopathy in the cervical, axillary or inguinal LUNGS: clear to auscultation with normal respiratory effort HEART: regular rate & rhythm,  no murmurs and no lower extremity edema ABDOMEN: abdomen soft, non-tender, normoactive bowel sounds  Musculoskeletal: no cyanosis of digits and no clubbing  PSYCH: alert & oriented x 3 with fluent speech NEURO: no  focal motor/sensory deficits  LABORATORY DATA:   I have reviewed the data as listed  . CBC Latest Ref Rng & Units 09/14/2015 07/03/2015 06/21/2015  WBC 3.9 - 10.3 10e3/uL 7.8 7.3 11.5(H)  Hemoglobin 11.6 - 15.9 g/dL 11.8 11.0(L) 11.0(L)  Hematocrit 34.8 - 46.6 % 34.3(L) 33.4(L) 32.8(L)  Platelets 145 - 400 10e3/uL 165 224.0 201.0  . CMP Latest Ref Rng & Units 09/14/2015 06/21/2015 06/16/2015  Glucose 70 - 140 mg/dl 111 - 168(H)  BUN 7.0 - 26.0 mg/dL 27.6(H) - 29.9(H)  Creatinine 0.6 - 1.1 mg/dL 1.4(H) - 1.3(H)  Sodium 136 - 145 mEq/L 137 - 139  Potassium 3.5 - 5.1 mEq/L 4.5 - 4.2  Chloride 101 - 111 mmol/L - - -  CO2 22 - 29 mEq/L 22 - 25  Calcium 8.4 - 10.4 mg/dL 9.4 - 9.3  Total Protein 6.4 - 8.3  g/dL 7.2 6.4 6.8  Total Bilirubin 0.20 - 1.20 mg/dL 0.38 0.4 <0.30  Alkaline Phos 40 - 150 U/L 94 69 68  AST 5 - 34 U/L '24 22 15  '$ ALT 0 - 55 U/L 36 29 21   . Lab Results  Component Value Date   LDH 204 09/14/2015      RADIOGRAPHIC STUDIES: I have personally reviewed the radiological images as listed and agreed with the findings in the report. Ct Chest W Contrast  Result Date: 09/14/2015 CLINICAL DATA:  Subsequent treatment for lung carcinoma. Chemotherapy complete April 2017 EXAM: CT CHEST, ABDOMEN, AND PELVIS WITH CONTRAST TECHNIQUE: Multidetector CT imaging of the chest, abdomen and pelvis was performed following the standard protocol during bolus administration of intravenous contrast. CONTRAST:  81m ISOVUE-300 IOPAMIDOL (ISOVUE-300) INJECTION 61% COMPARISON:  None. FINDINGS: CT CHEST FINDINGS Mediastinum/Nodes: No axillary or supraclavicular lymphadenopathy. Enlarged prevascular lymph node measures 9 mm (image 14, series 2) compared to 1 mm on prior. Mid RIGHT paratracheal lymph node measures 10 mm short axis compared to 4 mm. Ill-defined subcarinal tissue is similar prior. No supraclavicular lymphadenopathy identified. Lungs/Pleura: There is perihilar linear fibrotic consolidation which  is increased mildly in volume example on image 59, series 4. There is some improvement in the more peripheral airspace disease compared prior. No discrete measurable nodularity. The LEFT lung demonstrates volume loss in the upper lobe. No measurable nodularity. Musculoskeletal: No aggressive osseous lesion. CT ABDOMEN AND PELVIS FINDINGS Hepatobiliary:  No focal hepatic lesion.  Multiple gallstones. Pancreas: Pancreas is normal. No ductal dilatation. No pancreatic inflammation. Spleen: Normal spleen Adrenals/urinary tract: Adrenal glands and kidneys are normal. The ureters and bladder normal. Stomach/Bowel: Stomach, small bowel, appendix, and cecum are normal. The colon and rectosigmoid colon are normal. Vascular/Lymphatic: Abdominal aorta is normal caliber with atherosclerotic calcification. There is no retroperitoneal or periportal lymphadenopathy. No pelvic lymphadenopathy. Reproductive: Uterus normal. Partial calcification of the LEFT ovary. RIGHT ovary normal. Other: No free fluid. Musculoskeletal: No aggressive osseous lesion. 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 IMPRESSION: Chest Impression: 1. New mild paratracheal and prevascular adenopathy is concerning for lung cancer recurrence. Recommend FDG PET CT scan for restaging. 2. Increased consolidation surrounding the RIGHT hilum may well represent progressive radiation change. Perihilar consolidation could also be evaluated with FDG PET scan as above. Abdomen / Pelvis Impression: No evidence of metastatic disease in the abdomen pelvis. Electronically Signed   By: SSuzy BouchardM.D.   On: 09/14/2015 13:34   Ct Abdomen Pelvis W Contrast  Result Date: 09/14/2015 CLINICAL DATA:  Subsequent treatment for lung carcinoma. Chemotherapy complete April 2017 EXAM: CT CHEST, ABDOMEN, AND PELVIS WITH CONTRAST TECHNIQUE: Multidetector CT imaging of the chest, abdomen and pelvis was performed following the standard protocol during bolus  administration of intravenous contrast. CONTRAST:  835mISOVUE-300 IOPAMIDOL (ISOVUE-300) INJECTION 61% COMPARISON:  None. FINDINGS: CT CHEST FINDINGS Mediastinum/Nodes: No axillary or supraclavicular lymphadenopathy. Enlarged prevascular lymph node measures 9 mm (image 14, series 2) compared to 1 mm on prior. Mid RIGHT paratracheal lymph node measures 10 mm short axis compared to 4 mm. Ill-defined subcarinal tissue is similar prior. No supraclavicular lymphadenopathy identified. Lungs/Pleura: There is perihilar linear fibrotic consolidation which is increased mildly in volume example on image 59, series 4. There is some improvement in the more peripheral airspace disease compared prior. No discrete measurable nodularity. The LEFT lung demonstrates volume loss in the upper lobe. No measurable nodularity. Musculoskeletal: No aggressive osseous lesion. CT ABDOMEN AND PELVIS FINDINGS  Hepatobiliary:  No focal hepatic lesion.  Multiple gallstones. Pancreas: Pancreas is normal. No ductal dilatation. No pancreatic inflammation. Spleen: Normal spleen Adrenals/urinary tract: Adrenal glands and kidneys are normal. The ureters and bladder normal. Stomach/Bowel: Stomach, small bowel, appendix, and cecum are normal. The colon and rectosigmoid colon are normal. Vascular/Lymphatic: Abdominal aorta is normal caliber with atherosclerotic calcification. There is no retroperitoneal or periportal lymphadenopathy. No pelvic lymphadenopathy. Reproductive: Uterus normal. Partial calcification of the LEFT ovary. RIGHT ovary normal. Other: No free fluid. Musculoskeletal: No aggressive osseous lesion. 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 IMPRESSION: Chest Impression: 1. New mild paratracheal and prevascular adenopathy is concerning for lung cancer recurrence. Recommend FDG PET CT scan for restaging. 2. Increased consolidation surrounding the RIGHT hilum may well represent progressive radiation change. Perihilar  consolidation could also be evaluated with FDG PET scan as above. Abdomen / Pelvis Impression: No evidence of metastatic disease in the abdomen pelvis. Electronically Signed   By: Suzy Bouchard M.D.   On: 09/14/2015 13:34    ASSESSMENT & PLAN:   75 year old female with history of heavy smoking with  #1 Limited stage Small cell lung cancer. CT scan on 09/26/2014 at the outside hospital showed mediastinal adenopathy which was bronchoscopically biopsied and noted to be consistent with small cell lung cancer. No overt focal neurological deficits or symptoms suggestive of brain metastases at this time. Patient is status post 5 cycles of carboplatin etoposide with concurrent radiation therapy. She was noted to have good response on an interim PET CT scan on 11/29/2014.  PET CT scan after completion of 5 cycles of carboplatin and etoposide chemotherapy with concurrent radiation showed resolution of her lung mass.  MRI of the brain shows no evidence of metastatic disease at this time.  Patient completed prophylactic cranial irradiation  CT chest abdomen pelvis and MRI of the brain on 06/05/2015 shows no new evidence of metastatic disease.  #2 normocytic normochromic anemia related to chemotherapy resolved  Thrombocytopenia related to chemotherapy- Resolved #3 alopecia related to chemotherapy and RT. #4 protein calorie malnutrition and weight loss due to small cell lung cancer. Much improved up 8 lbs from 06/21/2015.  . Wt Readings from Last 3 Encounters:  09/18/15 152 lb 8 oz (69.2 kg)  07/05/15 149 lb 9.6 oz (67.9 kg)  06/21/15 144 lb 11.2 oz (65.6 kg)    #6 ex-smoker 90-pack-year history of smoking. Continues to stay off her cigarettes. #7 COPD -- has never been diagnosed as such. He has been recently given an albuterol inhaler as needed. Not oxygen dependent. #8 hypothyroidism -continue with levothyroxine replacement #9 Diabetes patient has been controlled with oral hypoglycemics.    #02 severe anxiety.Patient appears much improved on Celexa and when necessary lorazepam. Plan -patient has no clinical symptoms of cancer progression at this time. LDH level is within normal limits. -Given finding of mild new enlarged LN (patient with some recent resolved URI) - will get a PET/CT to evaluate the enlarged LN and progressive rt hilar consolidation (?radiation change vs tumor)  -No significant remaining toxicities from chemotherapy at this time. -encouraged  Healthy diet and exercise. -refilled celexa and ativan prescriptions per patients request. -continue f/u with primary care physician.  Return to care with Dr. Irene Limbo in 2 weeks with PET/CT. Marland Kitchen Orders Placed This Encounter  Procedures  . NM PET Image Restag (PS) Skull Base To Thigh    Standing Status:   Future    Standing Expiration Date:   11/17/2016  Order Specific Question:   Reason for Exam (SYMPTOM  OR DIAGNOSIS REQUIRED)    Answer:   h/o small cell lung cancer with recent CT showing possible new mediastinal Lnadenopathy-- for further evaluation to determine treatment strategy    Order Specific Question:   Preferred imaging location?    Answer:   [facility]    Order Specific Question:   If indicated for the ordered procedure, I authorize the administration of a radiopharmaceutical per Radiology protocol    Answer:   Yes    I spent 20 minutes counseling the patient face to face. The total time spent in the appointment was 25 minutes and more than 50% was on counseling and direct patient cares.    Sullivan Lone MD [place] AAHIVMS [facility] [place] Hematology/Oncology Physician [facility]  (Office):       [phone] (Work cell):  [phone] (Fax):           [phone]

## 2015-10-03 ENCOUNTER — Encounter (HOSPITAL_COMMUNITY): Payer: Medicare Other

## 2015-10-04 ENCOUNTER — Ambulatory Visit: Payer: Medicare Other | Admitting: Hematology

## 2015-10-11 ENCOUNTER — Encounter (HOSPITAL_COMMUNITY)
Admission: RE | Admit: 2015-10-11 | Discharge: 2015-10-11 | Disposition: A | Discharge status: Home / Self Care | Payer: Medicare Other | Source: Ambulatory Visit | Attending: Hematology | Admitting: Hematology

## 2015-10-11 DIAGNOSIS — C3491 Malignant neoplasm of unspecified part of right bronchus or lung: Secondary | ICD-10-CM | POA: Insufficient documentation

## 2015-10-11 LAB — GLUCOSE, CAPILLARY: Glucose-Capillary: 140 mg/dL — ABNORMAL HIGH (ref 65–99)

## 2015-10-11 MED ORDER — FLUDEOXYGLUCOSE F - 18 (FDG) INJECTION: 7.4000 | Freq: Once | INTRAVENOUS | Status: DC | PRN

## 2015-10-12 ENCOUNTER — Other Ambulatory Visit: Payer: Medicare Other

## 2015-10-13 ENCOUNTER — Encounter: Payer: Self-pay | Admitting: Hematology

## 2015-10-13 ENCOUNTER — Ambulatory Visit (HOSPITAL_BASED_OUTPATIENT_CLINIC_OR_DEPARTMENT_OTHER): Payer: Medicare Other | Admitting: Hematology

## 2015-10-13 VITALS — BP 96/59 | HR 106 | Temp 97.6°F | Resp 17 | Wt 144.9 lb

## 2015-10-13 DIAGNOSIS — E46 Unspecified protein-calorie malnutrition: Secondary | ICD-10-CM

## 2015-10-13 DIAGNOSIS — C3491 Malignant neoplasm of unspecified part of right bronchus or lung: Secondary | ICD-10-CM | POA: Diagnosis present

## 2015-10-13 DIAGNOSIS — R599 Enlarged lymph nodes, unspecified: Secondary | ICD-10-CM

## 2015-10-13 DIAGNOSIS — F329 Major depressive disorder, single episode, unspecified: Secondary | ICD-10-CM | POA: Diagnosis not present

## 2015-10-13 DIAGNOSIS — R590 Localized enlarged lymph nodes: Secondary | ICD-10-CM

## 2015-10-13 DIAGNOSIS — F32A Depression, unspecified: Secondary | ICD-10-CM

## 2015-10-18 ENCOUNTER — Ambulatory Visit (HOSPITAL_COMMUNITY)
Admission: RE | Admit: 2015-10-18 | Discharge: 2015-10-18 | Disposition: A | Discharge status: Home / Self Care | Payer: Medicare Other | Source: Ambulatory Visit | Attending: Radiation Oncology | Admitting: Radiation Oncology

## 2015-10-18 DIAGNOSIS — C7931 Secondary malignant neoplasm of brain: Secondary | ICD-10-CM | POA: Insufficient documentation

## 2015-10-18 DIAGNOSIS — C7949 Secondary malignant neoplasm of other parts of nervous system: Secondary | ICD-10-CM | POA: Diagnosis not present

## 2015-10-18 MED ORDER — GADOBENATE DIMEGLUMINE 529 MG/ML IV SOLN
10.0000 mL | Freq: Once | INTRAVENOUS | Status: AC
  Administered 2015-10-18: 7 mL via INTRAVENOUS

## 2015-10-19 ENCOUNTER — Encounter: Payer: Self-pay | Admitting: *Deleted

## 2015-10-19 NOTE — Progress Notes (Signed)
Oncology Nurse Navigator Documentation  Oncology Nurse Navigator Flowsheets 10/19/2015  Navigator Location [facility]/I received a message from Dr. Grier Mitts nurse.  He would like patient to be seen with T surgery for bronchoscopy and med.  I updated Dr. Servando Snare.  He stated to call the office to set up with any T surgeon.  I called and spoke with Ebony Hail.  She stated she will call me back to update me regarding appt. I will update Dr. Irene Limbo and his nurse   Navigator Encounter Type Other  Treatment Phase Pre-Tx/Tx Discussion  Barriers/Navigation Needs Coordination of Care  Interventions Coordination of Care  Coordination of Care Appts  Acuity Level 2  Acuity Level 2 Assistance expediting appointments  Time Spent with Patient 30

## 2015-10-20 ENCOUNTER — Encounter: Payer: Self-pay | Admitting: *Deleted

## 2015-10-20 NOTE — Progress Notes (Signed)
Patient family member notified of MRI results per MD [facility] request. Patient will notify us after biopsy consultation when the biopsy will be done scheduled. MD Irene Limbo will like appointment with him to be made 4-5 days after that.

## 2015-10-23 ENCOUNTER — Telehealth: Payer: Self-pay | Admitting: *Deleted

## 2015-10-23 NOTE — Telephone Encounter (Signed)
Per staff message, patient was called to inform her of apt with Dr. Isidore Moos on 9/27 '@1140'$ .

## 2015-10-24 ENCOUNTER — Other Ambulatory Visit: Payer: Medicare Other

## 2015-10-26 ENCOUNTER — Institutional Professional Consult (permissible substitution) (INDEPENDENT_AMBULATORY_CARE_PROVIDER_SITE_OTHER): Payer: Medicare Other | Admitting: Cardiothoracic Surgery

## 2015-10-26 ENCOUNTER — Other Ambulatory Visit: Payer: Self-pay | Admitting: *Deleted

## 2015-10-26 VITALS — BP 105/70 | HR 99 | Resp 16 | Ht 62.0 in | Wt 143.0 lb

## 2015-10-26 DIAGNOSIS — R599 Enlarged lymph nodes, unspecified: Secondary | ICD-10-CM

## 2015-10-26 DIAGNOSIS — Z923 Personal history of irradiation: Secondary | ICD-10-CM

## 2015-10-26 DIAGNOSIS — R590 Localized enlarged lymph nodes: Secondary | ICD-10-CM

## 2015-10-26 DIAGNOSIS — Z5189 Encounter for other specified aftercare: Secondary | ICD-10-CM | POA: Diagnosis not present

## 2015-10-26 DIAGNOSIS — Z85118 Personal history of other malignant neoplasm of bronchus and lung: Secondary | ICD-10-CM | POA: Diagnosis not present

## 2015-10-26 DIAGNOSIS — Z9221 Personal history of antineoplastic chemotherapy: Secondary | ICD-10-CM

## 2015-10-26 DIAGNOSIS — C3490 Malignant neoplasm of unspecified part of unspecified bronchus or lung: Secondary | ICD-10-CM

## 2015-10-26 NOTE — Progress Notes (Signed)
PCP is [name], NP Referring Provider is [name], MD  Chief Complaint  Patient presents with  . 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 Diagnosis was established in [place] apparently by bronchoscopy. The patient was treated in [place] with chemoradiation and had an excellent response. The patient has had no chemotherapy or radiation for 6 months. Follow-up 6 months scan however shows new hypermetabolic 2 cm 4R lymph node SUV 8.5 which was not present on the scan 6 months previously. There is no new parenchymal lung mass. The patient remains fairly asymptomatic other than baseline fatigue. The patient states she tolerated her chemoradiation very well with minimal weight loss of 10 pounds and some transient radiation esophagitis. She received blood transfusions for chemotherapy-induced anemia but not in several months. She smokes heavily 40 years but quit a year ago. She's had no history of cardiac disease, thoracic trauma, stroke, or reaction to anesthesia. The patient is referred for bronchoscopy and mediastinoscopy biopsy of 4R lymph node.  Past Medical History:  Diagnosis Date  . Anxiety   . Cancer ([place])   . Diabetes ([place])   . Dyslipidemia   . Hypertension   . Hypothyroidism   . Lung cancer ([place]) 8/216   hilar/mediastinum lung scca  . Pneumonia 2007  . Smoker    1.5 packs per day for 60 years.  Quit one month ago    Past Surgical History:  Procedure Laterality Date  . CESAREAN SECTION      Family History  Problem Relation Age of Onset  . Cancer Mother     lung non smoker  . Diabetes Mother   . Cancer Sister     colon  . Diabetes Sister   . Clotting disorder Neg Hx     Social  History Social History  Substance Use Topics  . Smoking status: Former Smoker    Packs/day: 1.50    Years: 60.00    Quit date: 09/12/2014  . Smokeless tobacco: Not on file  . Alcohol use No    Current Outpatient Prescriptions  Medication Sig Dispense Refill  . albuterol (PROVENTIL HFA;VENTOLIN HFA) 108 (90 BASE) MCG/ACT inhaler Inhale 2 puffs into the lungs every 6 (six) hours as needed for shortness of breath. 3.7 g 3  . cholecalciferol (VITAMIN D) 1000 UNITS tablet Take 1 tablet (1,000 Units total) by mouth daily. 90 tablet 3  . citalopram (CELEXA) 10 MG tablet Take 2 tablets (20 mg total) by mouth daily. 60 tablet 3  . glucose blood (ONETOUCH VERIO) test strip Test once daily. (Patient taking differently: 1 each by Other route. One to two times daily,) 100 each 5  . HYDROcodone-homatropine (HYCODAN) 5-1.5 MG/5ML syrup Take 5 mLs by mouth every 8 (eight) hours as needed for cough. 120 mL 0  . Iron Combinations (CHROMAGEN) capsule Take 1 capsule by mouth daily.    Marland Kitchen JANUVIA 100 MG tablet TAKE ONE TABLET BY MOUTH ONCE DAILY 30 tablet 11  . levothyroxine (SYNTHROID, LEVOTHROID) 112 MCG tablet TAKE ONE TABLET BY MOUTH TWICE DAILY 90 tablet 3  . lidocaine-prilocaine (EMLA) cream Apply topically once. 30 each 2  . LORazepam (ATIVAN) 0.5 MG tablet Take 1 tablet (0.5 mg total) by mouth every 8 (eight) hours as needed  for anxiety (or nausea). Could take 2 tab 30-45 mins prior to PET/CT and MRI Brain 50 tablet 0  . mirtazapine (REMERON) 15 MG tablet TAKE ONE-HALF TABLET (7.5 MG TOTAL) BY MOUTH AT BEDTIME (Patient taking differently: TAKE ONE TABLET (7.5 MG TOTAL) BY MOUTH AT BEDTIME) 30 tablet 6  . nystatin (MYCOSTATIN) 100000 UNIT/ML suspension Take 5 mLs (500,000 Units total) by mouth 4 (four) times daily. 60 mL 0  . ondansetron (ZOFRAN-ODT) 4 MG disintegrating tablet Take 4 mg by mouth every 8 (eight) hours as needed for nausea or vomiting. Reported on 07/05/2015    . ONETOUCH DELICA LANCETS FINE  MISC Test once daily. 100 each 5  . oxyCODONE (ROXICODONE) 5 MG/5ML solution Take 5-10 mLs (5-10 mg total) by mouth every 4 (four) hours as needed for moderate pain, severe pain or breakthrough pain. 473 mL 0  . polyethylene glycol (MIRALAX) packet Take 17 g by mouth daily. 30 each 1  . prenatal vitamin w/FE, FA (NATACHEW) 29-1 MG CHEW chewable tablet Chew 1 tablet by mouth daily at 12 noon.    . senna-docusate (SENNA S) 8.6-50 MG tablet Take 2 tablets by mouth at bedtime as needed for mild constipation. 60 tablet 1  . simvastatin (ZOCOR) 20 MG tablet Take 1 tablet (20 mg total) by mouth at bedtime. 90 tablet 3  . vitamin B-12 (CYANOCOBALAMIN) 1000 MCG tablet Take 1 tablet (1,000 mcg total) by mouth daily. 90 tablet 3  . lisinopril (PRINIVIL,ZESTRIL) 10 MG tablet Take 1 tablet (10 mg total) by mouth daily. (Patient not taking: Reported on 10/26/2015) 90 tablet 1   No current facility-administered medications for this visit.    Facility-Administered Medications Ordered in Other Visits  Medication Dose Route Frequency Provider Last Rate Last Dose  . sodium chloride 0.9 % injection 10 mL  10 mL Intracatheter PRN [name], MD   10 mL at 11/11/14 1131  . sodium chloride 0.9 % injection 10 mL  10 mL Intravenous PRN [name], MD   10 mL at 01/18/15 1527    No Known Allergies  Review of Systems        Review of Systems :  [ y ] = yes, [  ] = no        General :  Weight gain [   ]    Weight loss  [10 pounds   ]  Fatigue [  yes]  Fever [  ]  Chills  [  ]                                Weakness  [  ]           HEENT    Headache [  ]  Dizziness [  ]  Blurred vision [  ] Glaucoma  [  ]  status post cataract surgery bilateral                        Nosebleeds [  ] Painful or loose teeth [  ]        Cardiac :  Chest pain/ pressure [  ]  Resting SOB [  ] exertional SOB [place]er  ]                        Orthopnea [  ]  Pedal edema  [  ]  Palpitations [  ]  Syncope/presyncope '[ ]'$                          Paroxysmal nocturnal dyspnea [  ]         Pulmonary : cough [place]  ]  wheezing [  ]  Hemoptysis [  ] Sputum [  ] Snoring [  ]                              Pneumothorax [  ]  Sleep apnea [  ]        GI : Vomiting [  ]  Dysphagia [  ]  Melena  [  ]  Abdominal pain [  ] BRBPR [  ]              Heart burn [yes related to radiation  ]  Constipation [  ] Diarrhea  [  ] Colonoscopy [   ]        GU : Hematuria [  ]  Dysuria [  ]  Nocturia [  ] UTI's [  ]        Vascular : Claudication [  ]  Rest pain [  ]  DVT [  ] Vein stripping [  ] leg ulcers [  ]                          TIA [  ] Stroke [  ]  Varicose veins [  ]        NEURO :  Headaches  [place].Blacker  ] Seizures [  ] Vision changes [  ] Paresthesias [  ]                                       Seizures [  ] right hand dominant        Musculoskeletal :  Arthritis [  ] Gout  [  ]  Back pain [  ]  Joint pain [ yes ]        Skin :  Rash [  ]  Melanoma [  ] Sores [  ]        Heme : Bleeding problems [  ]Clotting Disorders [  ] Anemia [ yes now improved ]Blood Transfusion yes'[ ]'$         Endocrine : Diabetes [yes on oral hypoglycemic agent  ] Heat or Cold intolerance [  ] Polyuria [  ]excessive thirst '[ ]'$         Psych : Depression [  ]  Anxiety [  ]  Psych hospitalizations [  ] Memory change [  ]                                               BP 105/70   Pulse 99   Resp 16   Ht '5\' 2"'$  (1.575 m)   Wt 143 lb (64.9 kg)   SpO2 99% Comment: ON RA  BMI 26.16 kg/m  Physical Exam      Physical Exam  General: Very nice 75 year old frail appearing female no acute distress HEENT: Normocephalic pupils equal , dentition adequate Neck: Supple without  JVD, adenopathy, or bruit Chest: Clear to auscultation, symmetrical breath sounds, no rhonchi, no tenderness             or deformity. Radiation tattoos on right side of chest parasternal Cardiovascular: Regular rate and rhythm, no murmur, no gallop, peripheral pulses             palpable in  all extremities Abdomen:  Soft, nontender, no palpable mass or organomegaly Extremities: Warm, well-perfused, no clubbing cyanosis edema or tenderness,              no venous stasis changes of the legs Rectal/GU: Deferred Neuro: Grossly non--focal and symmetrical throughout Skin: Clean and dry without rash or ulceration   Diagnostic Tests: PET scan shows a 2 cm  4R lymph node with hypermetabolic activity which should be accessible for biopsy with mediastinoscopy  Impression: Small cell carcinoma right lung status post chemoradiation Possible recurrent disease in the mediastinum with hypermetabolic lymphadenopathy  Plan: Patient will be cared for bronchoscopy and mediastinoscopy on September 21 at [place] under general anesthesia. The patient has no problems this could be outpatient. If the patient has any cardiac or respiratory issues following the procedure she will be kept overnight for observation. I discussed the procedure in detail the patient and family including the indications benefits and risks of bleeding infection sternotomy and death.  Len Childs, MD [facility] [phone]

## 2015-10-29 NOTE — Progress Notes (Signed)
Marland Kitchen  HEMATOLOGY ONCOLOGY PROGRESS NOTE  Date of service: .10/13/2015   Patient Care Team: [name], NP as PCP - General (Family Medicine)  Diagnosis: Limited stage small cell lung cancer  Current Treatment: monitoring  Completed treatment  Status post 5 cycles of carboplatin and etoposide Status post Concurrent radiation therapy completed 12/16/2014.  Prophylactic cranial radiation therapy  INTERVAL HISTORY:  Patient is here for her scheduled follow-up for of her PET/CT scan which shows new FDG avid mediastinal lymphadenopathy.  Patient  And her family notes that she has been minimally involved in activities other than sitting in bed watching TV.  She has not been very physically active and does not feel the desire to do much.  She endorses possibility of depression despite being on first-line antidepressants.  She is agreeable to getting a psychiatry evaluation - referral was placed. She also is okay with getting a biopsy to have tissue confirmation of her small cell lung cancer recurrence. Her MRI of the brain on 9/07/20/2015 showed no evidence of metastatic disease in the brain. Patient notes no chest pain or new shortness of breath.  REVIEW OF SYSTEMS:    10 Point review of systems of done and is negative except as noted above.  . Past Medical History:  Diagnosis Date  . Anxiety   . Cancer ([place])   . Diabetes ([place])   . Dyslipidemia   . Hypertension   . Hypothyroidism   . Lung cancer ([place]) 8/216   hilar/mediastinum lung scca  . Pneumonia 2007  . Smoker    1.5 packs per day for 60 years.  Quit one month ago    . Past Surgical History:  Procedure Laterality Date  . CESAREAN SECTION      . Social History  Substance Use Topics  . Smoking status: Former Smoker    Packs/day: 1.50    Years: 60.00    Quit date: 09/12/2014  . Smokeless tobacco: Not on file  . Alcohol use No    ALLERGIES:  has No Known Allergies.  MEDICATIONS:  Current Outpatient Prescriptions   Medication Sig Dispense Refill  . albuterol (PROVENTIL HFA;VENTOLIN HFA) 108 (90 BASE) MCG/ACT inhaler Inhale 2 puffs into the lungs every 6 (six) hours as needed for shortness of breath. 3.7 g 3  . cholecalciferol (VITAMIN D) 1000 UNITS tablet Take 1 tablet (1,000 Units total) by mouth daily. 90 tablet 3  . citalopram (CELEXA) 10 MG tablet Take 2 tablets (20 mg total) by mouth daily. 60 tablet 3  . glucose blood (ONETOUCH VERIO) test strip Test once daily. (Patient taking differently: 1 each by Other route. One to two times daily,) 100 each 5  . HYDROcodone-homatropine (HYCODAN) 5-1.5 MG/5ML syrup Take 5 mLs by mouth every 8 (eight) hours as needed for cough. 120 mL 0  . Iron Combinations (CHROMAGEN) capsule Take 1 capsule by mouth daily.    Marland Kitchen JANUVIA 100 MG tablet TAKE ONE TABLET BY MOUTH ONCE DAILY 30 tablet 11  . levothyroxine (SYNTHROID, LEVOTHROID) 112 MCG tablet TAKE ONE TABLET BY MOUTH TWICE DAILY 90 tablet 3  . lidocaine-prilocaine (EMLA) cream Apply topically once. 30 each 2  . lisinopril (PRINIVIL,ZESTRIL) 10 MG tablet Take 1 tablet (10 mg total) by mouth daily. (Patient not taking: Reported on 10/26/2015) 90 tablet 1  . LORazepam (ATIVAN) 0.5 MG tablet Take 1 tablet (0.5 mg total) by mouth every 8 (eight) hours as needed for anxiety (or nausea). Could take 2 tab 30-45 mins prior to PET/CT and  MRI Brain 50 tablet 0  . mirtazapine (REMERON) 15 MG tablet TAKE ONE-HALF TABLET (7.5 MG TOTAL) BY MOUTH AT BEDTIME (Patient taking differently: TAKE ONE TABLET (7.5 MG TOTAL) BY MOUTH AT BEDTIME) 30 tablet 6  . nystatin (MYCOSTATIN) 100000 UNIT/ML suspension Take 5 mLs (500,000 Units total) by mouth 4 (four) times daily. 60 mL 0  . ondansetron (ZOFRAN-ODT) 4 MG disintegrating tablet Take 4 mg by mouth every 8 (eight) hours as needed for nausea or vomiting. Reported on 07/05/2015    . ONETOUCH DELICA LANCETS FINE MISC Test once daily. 100 each 5  . oxyCODONE (ROXICODONE) 5 MG/5ML solution Take 5-10  mLs (5-10 mg total) by mouth every 4 (four) hours as needed for moderate pain, severe pain or breakthrough pain. 473 mL 0  . polyethylene glycol (MIRALAX) packet Take 17 g by mouth daily. 30 each 1  . prenatal vitamin w/FE, FA (NATACHEW) 29-1 MG CHEW chewable tablet Chew 1 tablet by mouth daily at 12 noon.    . senna-docusate (SENNA S) 8.6-50 MG tablet Take 2 tablets by mouth at bedtime as needed for mild constipation. 60 tablet 1  . simvastatin (ZOCOR) 20 MG tablet Take 1 tablet (20 mg total) by mouth at bedtime. 90 tablet 3  . vitamin B-12 (CYANOCOBALAMIN) 1000 MCG tablet Take 1 tablet (1,000 mcg total) by mouth daily. 90 tablet 3   No current facility-administered medications for this visit.    Facility-Administered Medications Ordered in Other Visits  Medication Dose Route Frequency Provider Last Rate Last Dose  . sodium chloride 0.9 % injection 10 mL  10 mL Intracatheter PRN [name], MD   10 mL at 11/11/14 1131  . sodium chloride 0.9 % injection 10 mL  10 mL Intravenous PRN [name], MD   10 mL at 01/18/15 1527    PHYSICAL EXAMINATION: ECOG PERFORMANCE STATUS: 1 - Symptomatic but completely ambulatory  . Vitals:   10/13/15 1156  BP: (!) 96/59  Pulse: (!) 106  Resp: 17  Temp: 97.6 F (36.4 C)    Filed Weights   10/13/15 1156  Weight: 144 lb 14.4 oz (65.7 kg)   .Body mass index is 26.5 kg/m.  GENERAL:alert, in no acute distress and comfortable SKIN: skin color, texture, turgor are normal, no rashes or significant lesions EYES: normal, conjunctiva are pink and non-injected, sclera clear OROPHARYNX:no exudate, no erythema and lips, buccal mucosa, and tongue normal  NECK: supple, no JVD, thyroid normal size, non-tender, without nodularity LYMPH:  no palpable lymphadenopathy in the cervical, axillary or inguinal LUNGS: clear to auscultation with normal respiratory effort HEART: regular rate & rhythm,  no murmurs and no lower extremity edema ABDOMEN:  abdomen soft, non-tender, normoactive bowel sounds  Musculoskeletal: no cyanosis of digits and no clubbing  PSYCH: alert & oriented x 3 with fluent speech NEURO: no focal motor/sensory deficits  LABORATORY DATA:   I have reviewed the data as listed  . CBC Latest Ref Rng & Units 09/14/2015 07/03/2015 06/21/2015  WBC 3.9 - 10.3 10e3/uL 7.8 7.3 11.5(H)  Hemoglobin 11.6 - 15.9 g/dL 11.8 11.0(L) 11.0(L)  Hematocrit 34.8 - 46.6 % 34.3(L) 33.4(L) 32.8(L)  Platelets 145 - 400 10e3/uL 165 224.0 201.0  . CMP Latest Ref Rng & Units 09/14/2015 06/21/2015 06/16/2015  Glucose 70 - 140 mg/dl 111 - 168(H)  BUN 7.0 - 26.0 mg/dL 27.6(H) - 29.9(H)  Creatinine 0.6 - 1.1 mg/dL 1.4(H) - 1.3(H)  Sodium 136 - 145 mEq/L 137 - 139  Potassium 3.5 -  5.1 mEq/L 4.5 - 4.2  Chloride 101 - 111 mmol/L - - -  CO2 22 - 29 mEq/L 22 - 25  Calcium 8.4 - 10.4 mg/dL 9.4 - 9.3  Total Protein 6.4 - 8.3 g/dL 7.2 6.4 6.8  Total Bilirubin 0.20 - 1.20 mg/dL 0.38 0.4 <0.30  Alkaline Phos 40 - 150 U/L 94 69 68  AST 5 - 34 U/L '24 22 15  '$ ALT 0 - 55 U/L 36 29 21   . Lab Results  Component Value Date   LDH 204 09/14/2015      RADIOGRAPHIC STUDIES: I have personally reviewed the radiological images as listed and agreed with the findings in the report. Mr Jeri Cos Wo Contrast  Result Date: 10/18/2015 CLINICAL DATA:  Lung cancer. Chemotherapy and whole-brain radiation. EXAM: MRI HEAD WITHOUT AND WITH CONTRAST TECHNIQUE: Multiplanar, multiecho pulse sequences of the brain and surrounding structures were obtained without and with intravenous contrast. CONTRAST:  9m MULTIHANCE GADOBENATE DIMEGLUMINE 529 MG/ML IV SOLN COMPARISON:  MRI 07/03/2015 FINDINGS: Generalized cortical atrophy unchanged. Negative for hydrocephalus. Extensive white matter hyperintensity diffusely has progressed since the prior study compatible with radiation and chemotherapy. Negative for acute infarct. Negative for hemorrhage or fluid collection. Postcontrast imaging  demonstrates no enhancing metastatic deposits. Small ill-defined area of enhancement in the left frontal lobe no longer visualized. Based on prior studies this was felt to be a small vascular malformation. Leptomeningeal enhancement normal. Normal venous sinus enhancement. Negative calvarium.  Normal skullbase Left mastoid sinus effusion unchanged. Small right mastoid sinus effusion unchanged. IMPRESSION: Progression of diffuse white matter abnormality compatible with chemo and radiation effect. Negative for acute infarct Negative for metastatic disease. Electronically Signed   By: CFranchot GalloM.D.   On: 10/18/2015 15:25   Nm Pet Image Restag (ps) Skull Base To Thigh  Result Date: 10/11/2015 CLINICAL DATA:  Subsequent treatment strategy for right lung small cell carcinoma. EXAM: NUCLEAR MEDICINE PET SKULL BASE TO THIGH TECHNIQUE: 7.4 mCi F-18 FDG was injected intravenously. Full-ring PET imaging was performed from the skull base to thigh after the radiotracer. CT data was obtained and used for attenuation correction and anatomic localization. FASTING BLOOD GLUCOSE:  Value: 140 mg/dl COMPARISON:  CT on 09/14/2015 and PET-CT on 03/06/2015 FINDINGS: NECK No hypermetabolic lymph nodes in the neck. CHEST New hypermetabolic mediastinal lymphadenopathy seen within the prevascular, lateral aortic, and right paratracheal regions. Largest lymph node in right paratracheal region measures 2.4 cm on image 58/4, with SUV max of 8.9. Pulmonary airspace disease in the right perihilar and paramediastinal regions shows mild hypermetabolic activity and is most likely due to radiation changes. No discrete pulmonary masses identified. No evidence of pleural effusion. Aortic atherosclerosis.  Coronary artery calcification. ABDOMEN/PELVIS No abnormal hypermetabolic activity within the liver, pancreas, adrenal glands, or spleen. No hypermetabolic lymph nodes in the abdomen or pelvis. Cholelithiasis again noted, without evidence of  cholecystitis. Aortic atherosclerosis. SKELETON No focal hypermetabolic activity to suggest skeletal metastasis. IMPRESSION: New mild mediastinal lymphadenopathy shows hypermetabolic activity, consistent with recurrent carcinoma. Probable right perihilar and paramediastinal radiation changes. No evidence of metastatic disease within the neck, abdomen, or pelvis. Electronically Signed   By: JEarle GellM.D.   On: 10/11/2015 10:13    ASSESSMENT & PLAN:   75year old female with history of heavy smoking with  #1 Limited stage Small cell lung cancer diagnosed August 2016 . CT scan on 09/26/2014 at the outside hospital showed mediastinal adenopathy which was bronchoscopically biopsied and noted to be consistent with  small cell lung cancer. No overt focal neurological deficits or symptoms suggestive of brain metastases at this time. Patient completed 5 cycles of carboplatin etoposide with concurrent radiation therapy. PET CT scan 03/06/2015 after completion of 5 cycles of carboplatin and etoposide chemotherapy with concurrent radiation showed resolution of her lung mass.  MRI of the brain shows no evidence of metastatic disease at this time.  Patient completed prophylactic cranial irradiation  CT chest abdomen pelvis and MRI of the brain on 06/05/2015 shows no new evidence of metastatic disease.  #2 normocytic normochromic anemia related to chemotherapy resolved  Thrombocytopenia related to chemotherapy- Resolved #3 alopecia related to chemotherapy and RT. #4 protein calorie malnutrition and weight loss due to small cell lung cancer. Much improved up 8 lbs from 06/21/2015.  . Wt Readings from Last 3 Encounters:  10/26/15 143 lb (64.9 kg)  10/13/15 144 lb 14.4 oz (65.7 kg)  09/18/15 152 lb 8 oz (69.2 kg)    #6 ex-smoker 90-pack-year history of smoking. Continues to stay off her cigarettes. #7 COPD -- has never been diagnosed as such. He has been recently given an albuterol inhaler as needed. Not  oxygen dependent. #8 hypothyroidism -continue with levothyroxine replacement #9 Diabetes patient has been controlled with oral hypoglycemics.  #10 severe anxiety.Patient appears much improved on Celexa and when necessary lorazepam. Noted to have significant depression which has led to minimal involvement with her family much to their Chagrin.  Patient notes that she doesn't feel like doing anything and has not been very physically active and has avoided reasonable food intake. Plan -given the concerning PET CT findings I discussed with the patient and her family that there is a concern for possible small cell lung cancer recurrence in the mediastinum.  No other metastatic disease noted on the PET/CT scan.  MRI brain showed no metastatic disease. -We will refer the patient to cardiothoracic surgery for possible bronchoscopic R mediastinoscopic biopsy for tissue confirmation of possible small cell carcinoma recurrence. -And discussed with Dr. Isidore Moos from radiation oncology about the role for any palliative radiation if this was histologically confirmed recurrence and she noted that the toxicities would be high given previous radiation therapy. -Patient was encouraged to maintain good physical activity and good oral intake. -We will refer her to behavioral health for further management of her depression. -If this is small cell lung cancer  We will have to discuss the patient's preference about using an additional palliative chemotherapy.  Since her recurrence is more than 6 months out which could potentially repeat her platinum doublet chemotherapy. -continue f/u with primary care physician.  Return to care with Dr. Irene Limbo in 3-4 days after mediastinal LN biopsy within about 2 weeks to determine further treatment options. . Orders Placed This Encounter  Procedures  . Ambulatory referral to Psychiatry    Referral Priority:   Urgent    Referral Type:   Psychiatric    Referral Reason:   Specialty  Services Required    Requested Specialty:   Psychiatry    Number of Visits Requested:   1  . Ambulatory referral to Cardiothoracic Surgery    Referral Priority:   Urgent    Referral Type:   Surgical    Referral Reason:   Specialty Services Required    Referred to Provider:   Melrose Nakayama, MD    Requested Specialty:   Cardiothoracic Surgery    Number of Visits Requested:   1    I spent 20 minutes counseling the patient  face to face. The total time spent in the appointment was 25 minutes and more than 50% was on counseling and direct patient cares.    Sullivan Lone MD [place] AAHIVMS [facility] Hematology/Oncology Physician [facility]  (Office):       [phone] (Work cell):  [phone] (Fax):           [phone]

## 2015-10-31 ENCOUNTER — Encounter (HOSPITAL_COMMUNITY)
Admission: RE | Admit: 2015-10-31 | Discharge: 2015-10-31 | Disposition: A | Discharge status: Home / Self Care | Payer: Medicare Other | Source: Ambulatory Visit | Attending: Cardiothoracic Surgery | Admitting: Cardiothoracic Surgery

## 2015-10-31 ENCOUNTER — Encounter (HOSPITAL_COMMUNITY): Payer: Self-pay

## 2015-10-31 DIAGNOSIS — C3490 Malignant neoplasm of unspecified part of unspecified bronchus or lung: Secondary | ICD-10-CM | POA: Diagnosis not present

## 2015-10-31 DIAGNOSIS — E119 Type 2 diabetes mellitus without complications: Secondary | ICD-10-CM | POA: Diagnosis not present

## 2015-10-31 DIAGNOSIS — Z01818 Encounter for other preprocedural examination: Secondary | ICD-10-CM | POA: Insufficient documentation

## 2015-10-31 HISTORY — DX: Anemia, unspecified: D64.9

## 2015-10-31 HISTORY — DX: Personal history of other medical treatment: Z92.89

## 2015-10-31 HISTORY — DX: Reserved for inherently not codable concepts without codable children: IMO0001

## 2015-10-31 LAB — CBC
HCT: 34.4 % — ABNORMAL LOW (ref 36.0–46.0)
Hemoglobin: 11.5 g/dL — ABNORMAL LOW (ref 12.0–15.0)
MCH: 31.9 pg (ref 26.0–34.0)
MCHC: 33.4 g/dL (ref 30.0–36.0)
MCV: 95.6 fL (ref 78.0–100.0)
Platelets: 176 10*3/uL (ref 150–400)
RBC: 3.6 MIL/uL — ABNORMAL LOW (ref 3.87–5.11)
RDW: 13.8 % (ref 11.5–15.5)
WBC: 7.2 10*3/uL (ref 4.0–10.5)

## 2015-10-31 LAB — COMPREHENSIVE METABOLIC PANEL
ALT: 37 U/L (ref 14–54)
AST: 25 U/L (ref 15–41)
Albumin: 3.6 g/dL (ref 3.5–5.0)
Alkaline Phosphatase: 75 U/L (ref 38–126)
Anion gap: 7 (ref 5–15)
BUN: 26 mg/dL — ABNORMAL HIGH (ref 6–20)
CO2: 26 mmol/L (ref 22–32)
Calcium: 9.2 mg/dL (ref 8.9–10.3)
Chloride: 104 mmol/L (ref 101–111)
Creatinine, Ser: 1.23 mg/dL — ABNORMAL HIGH (ref 0.44–1.00)
GFR calc Af Amer: 48 mL/min — ABNORMAL LOW (ref >60)
GFR calc non Af Amer: 41 mL/min — ABNORMAL LOW (ref >60)
Glucose, Bld: 150 mg/dL — ABNORMAL HIGH (ref 65–99)
Potassium: 4.7 mmol/L (ref 3.5–5.1)
Sodium: 137 mmol/L (ref 135–145)
Total Bilirubin: 0.5 mg/dL (ref 0.3–1.2)
Total Protein: 6.3 g/dL — ABNORMAL LOW (ref 6.5–8.1)

## 2015-10-31 LAB — TYPE AND SCREEN
ABO/RH(D): B POS
Antibody Screen: NEGATIVE

## 2015-10-31 LAB — GLUCOSE, CAPILLARY: Glucose-Capillary: 173 mg/dL — ABNORMAL HIGH (ref 65–99)

## 2015-10-31 LAB — PROTIME-INR
INR: 1.05
Prothrombin Time: 13.7 seconds (ref 11.4–15.2)

## 2015-10-31 LAB — SURGICAL PCR SCREEN
MRSA, PCR: NEGATIVE
Staphylococcus aureus: NEGATIVE

## 2015-10-31 LAB — APTT: aPTT: 28 seconds (ref 24–36)

## 2015-10-31 LAB — ABO/RH: ABO/RH(D): B POS

## 2015-10-31 NOTE — Progress Notes (Signed)
   How to Manage Your Diabetes Before and After Surgery  Why is it important to control my blood sugar before and after surgery? . Improving blood sugar levels before and after surgery helps healing and can limit problems. . A way of improving blood sugar control is eating a healthy diet by: o  Eating less sugar and carbohydrates o  Increasing activity/exercise o  Talking with your doctor about reaching your blood sugar goals . High blood sugars (greater than 180 mg/dL) can raise your risk of infections and slow your recovery, so you will need to focus on controlling your diabetes during the weeks before surgery. . Make sure that the doctor who takes care of your diabetes knows about your planned surgery including the date and location.  How do I manage my blood sugar before surgery? . Check your blood sugar at least 4 times a day, starting 2 days before surgery, to make sure that the level is not too high or low. o Check your blood sugar the morning of your surgery when you wake up and every 2 hours until you get to the Short Stay unit. . If your blood sugar is less than 70 mg/dL, you will need to treat for low blood sugar: o Do not take insulin. o Treat a low blood sugar (less than 70 mg/dL) with  cup of clear juice (cranberry or apple), 4 glucose tablets, OR glucose gel. o Recheck blood sugar in 15 minutes after treatment (to make sure it is greater than 70 mg/dL). If your blood sugar is not greater than 70 mg/dL on recheck, call [phone] for further instructions. . Report your blood sugar to the short stay nurse when you get to Short Stay.  . If you are admitted to the hospital after surgery: o Your blood sugar will be checked by the staff and you will probably be given insulin after surgery (instead of oral diabetes medicines) to make sure you have good blood sugar levels. o The goal for blood sugar control after surgery is 80-180 mg/dL.    What about Medications For Diabetes:   DO NOT take pills for Diabetes the morning of surgery.  Patient Signature:  Date:   Nurse Signature:  Date:   Reviewed and Endorsed by [facility] Patient Education Committee, August 2015

## 2015-10-31 NOTE — Pre-Procedure Instructions (Addendum)
    Rachel Chapman  10/31/2015     Your procedure is scheduled on  Monday, September 25.  Report to [facility] Admitting at 12:30 PM                Your surgery or procedure is scheduled for 10:50 AM   Call this number if you have problems the morning of [phone]      Remember:  Do not eat food or drink liquids after midnight Wednesday, September 20.   Take these medicines the morning of surgery with A SIP OF WATER: citalopram (CELEXA), levothyroxine (SYNTHROID, LEVOTHROID).                     May take oxyCODONE (ROXICODONE) if needed and you tolerated it on an empty stomach.                    DO NOT take [name] for Diabetes .                Stop taking Aspirin, Vitamins Herbal medications.  Do not take any NSAIDs ie: Ibuprofen,  Advil,Naproxen or any medication containing Aspirin.   Do not wear jewelry, make-up or nail polish.  Do not wear lotions, powders, or perfumes, or deoderant.  Do not shave 48 hours prior to surgery.    Do not bring valuables to the hospital.  [facility] is not responsible for any belongings or valuables.  Contacts, dentures or bridgework may not be worn into surgery.  Leave your suitcase in the car.  After surgery it may be brought to your room.  For patients admitted to the hospital, discharge time will be determined by your treatment team.  Patients discharged the day of surgery will not be allowed to drive home.   Name and phone number of your driver:   -  Special instructions: Review  [name] - Preparing For Surgery.  Please read over the following fact sheets you were givno were given:  Review  [name] - Preparing For Surgery, Pain Booklet, Coughing and Deep Breathing.

## 2015-11-01 LAB — HEMOGLOBIN A1C
HEMOGLOBIN A1C: 6 % — AB (ref 4.8–5.6)
Mean Plasma Glucose: 126 mg/dL

## 2015-11-03 ENCOUNTER — Encounter (HOSPITAL_COMMUNITY): Payer: Self-pay | Admitting: Certified Registered Nurse Anesthetist

## 2015-11-04 ENCOUNTER — Emergency Department (HOSPITAL_COMMUNITY)
Admission: EM | Admit: 2015-11-04 | Discharge: 2015-11-05 | Disposition: A | Discharge status: Home / Self Care | Payer: Medicare Other | Attending: Emergency Medicine | Admitting: Emergency Medicine

## 2015-11-04 ENCOUNTER — Emergency Department (HOSPITAL_COMMUNITY): Payer: Medicare Other

## 2015-11-04 ENCOUNTER — Encounter (HOSPITAL_COMMUNITY): Payer: Self-pay | Admitting: Oncology

## 2015-11-04 DIAGNOSIS — Z79899 Other long term (current) drug therapy: Secondary | ICD-10-CM | POA: Diagnosis not present

## 2015-11-04 DIAGNOSIS — Z87891 Personal history of nicotine dependence: Secondary | ICD-10-CM | POA: Insufficient documentation

## 2015-11-04 DIAGNOSIS — Z85118 Personal history of other malignant neoplasm of bronchus and lung: Secondary | ICD-10-CM | POA: Diagnosis not present

## 2015-11-04 DIAGNOSIS — I10 Essential (primary) hypertension: Secondary | ICD-10-CM | POA: Insufficient documentation

## 2015-11-04 DIAGNOSIS — T07XXXA Unspecified multiple injuries, initial encounter: Secondary | ICD-10-CM

## 2015-11-04 DIAGNOSIS — Z7984 Long term (current) use of oral hypoglycemic drugs: Secondary | ICD-10-CM | POA: Insufficient documentation

## 2015-11-04 DIAGNOSIS — Z23 Encounter for immunization: Secondary | ICD-10-CM | POA: Insufficient documentation

## 2015-11-04 DIAGNOSIS — S01111A Laceration without foreign body of right eyelid and periocular area, initial encounter: Secondary | ICD-10-CM | POA: Diagnosis not present

## 2015-11-04 DIAGNOSIS — S42211A Unspecified displaced fracture of surgical neck of right humerus, initial encounter for closed fracture: Secondary | ICD-10-CM | POA: Insufficient documentation

## 2015-11-04 DIAGNOSIS — S50311A Abrasion of right elbow, initial encounter: Secondary | ICD-10-CM | POA: Diagnosis not present

## 2015-11-04 DIAGNOSIS — S4991XA Unspecified injury of right shoulder and upper arm, initial encounter: Secondary | ICD-10-CM | POA: Diagnosis present

## 2015-11-04 DIAGNOSIS — Y9301 Activity, walking, marching and hiking: Secondary | ICD-10-CM | POA: Insufficient documentation

## 2015-11-04 DIAGNOSIS — Y929 Unspecified place or not applicable: Secondary | ICD-10-CM | POA: Diagnosis not present

## 2015-11-04 DIAGNOSIS — E119 Type 2 diabetes mellitus without complications: Secondary | ICD-10-CM | POA: Insufficient documentation

## 2015-11-04 DIAGNOSIS — E039 Hypothyroidism, unspecified: Secondary | ICD-10-CM | POA: Insufficient documentation

## 2015-11-04 DIAGNOSIS — W01198A Fall on same level from slipping, tripping and stumbling with subsequent striking against other object, initial encounter: Secondary | ICD-10-CM | POA: Insufficient documentation

## 2015-11-04 DIAGNOSIS — W010XXA Fall on same level from slipping, tripping and stumbling without subsequent striking against object, initial encounter: Secondary | ICD-10-CM

## 2015-11-04 DIAGNOSIS — Y999 Unspecified external cause status: Secondary | ICD-10-CM | POA: Insufficient documentation

## 2015-11-04 DIAGNOSIS — S42201A Unspecified fracture of upper end of right humerus, initial encounter for closed fracture: Secondary | ICD-10-CM

## 2015-11-04 MED ORDER — TETANUS-DIPHTH-ACELL PERTUSSIS 5-2.5-18.5 LF-MCG/0.5 IM SUSP
0.5000 mL | Freq: Once | INTRAMUSCULAR | Status: AC
  Administered 2015-11-04: 0.5 mL via INTRAMUSCULAR
  Filled 2015-11-04: qty 0.5

## 2015-11-04 MED ORDER — FENTANYL CITRATE (PF) 100 MCG/2ML IJ SOLN
50.0000 ug | INTRAMUSCULAR | Status: DC | PRN
  Administered 2015-11-04: 50 ug via INTRAVENOUS
  Filled 2015-11-04: qty 2

## 2015-11-04 NOTE — ED Triage Notes (Signed)
Pt states while walking she tripped and landed on her right shoulder on cement.  Pt has an abrasion to her right eyebrow and right elbow.  Rates pain 8/10.  Pt is A&O x 4.  Denies LOC and neck pain.

## 2015-11-04 NOTE — ED Provider Notes (Signed)
[facility] DEPT Provider Note   CSN: [id number] Arrival date & time: 11/04/15  2241     History   Chief Complaint Chief Complaint  Patient presents with  . Fall  . Shoulder Pain    HPI Shermika Gassett is a 75 y.o. female.  Patient c/o fall earlier this evening around 9 pm, after marching band competition. States was walking down an incline, and got going too fast, and fell forward. No loc. C/o abrasion/contusion to face and right shoulder area. Pain constant, dull, moderate, non radiating. No headache. No neck or back pain. No numbness/weakness.  No nv. No fever or chills. States had felt normal/well all day. Tetanus unknown. Denies any other pain or injury.    The history is provided by the patient.  Fall  Pertinent negatives include no chest pain, no abdominal pain, no headaches and no shortness of breath.  Shoulder Pain   Pertinent negatives include no numbness.    Past Medical History:  Diagnosis Date  . Anemia   . Anxiety   . Cancer ([place])   . Diabetes ([place])    Type II  . Dyslipidemia   . History of blood transfusion   . Hypertension   . Hypothyroidism   . 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 Pneumonia 2007  . Shortness of breath dyspnea    with exertion  . Smoker    1.5 packs per day for 60 years.  Quit one month ago    Patient Active Problem List   Diagnosis Date Noted  . Risk of brain metastases warranting prophylactic brain radiation 03/15/2015  . Anemia due to antineoplastic chemotherapy 01/18/2015  . Nausea with vomiting   . Symptomatic anemia   . Protein-calorie malnutrition, severe ([place]) 12/13/2014  . Hyponatremia 12/13/2014  . Orthostatic hypotension 12/13/2014  . Neoplasm related pain 12/13/2014  . Nausea without vomiting 12/13/2014  . Dehydration 12/12/2014  . Radiation-induced esophagitis 12/12/2014  . Hypotension 12/12/2014  . Antineoplastic chemotherapy induced pancytopenia ([place]) 12/12/2014  . Diabetes type  2, controlled ([place]) 11/01/2014  . Hypothyroidism 11/01/2014  . Hyperlipidemia 11/01/2014  . 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 CESAREAN SECTION    . EYE SURGERY Bilateral    Cataract    OB History    No data available       Home Medications    Prior to Admission medications   Medication Sig Start Date End Date Taking? Authorizing Provider  albuterol (PROVENTIL HFA;VENTOLIN HFA) 108 (90 BASE) MCG/ACT inhaler Inhale 2 puffs into the lungs every 6 (six) hours as needed for shortness of breath. 11/01/14   [name], NP  cholecalciferol (VITAMIN D) 1000 UNITS tablet Take 1 tablet (1,000 Units total) by mouth daily. 11/01/14   [name], NP  citalopram (CELEXA) 10 MG tablet Take 2 tablets (20 mg total) by mouth daily. 07/05/15   [name], MD  glucose blood (ONETOUCH VERIO) test strip Test once daily. Patient taking differently: 1 each by Other route. One to two times daily, 11/15/14   [name], NP  HYDROcodone-homatropine [facility]) 5-1.5 MG/5ML syrup Take 5 mLs by mouth every 8 (eight) hours as needed for cough. Patient not taking: Reported on 10/30/2015 05/29/15   [name], NP  IRON PO Take 1 tablet by mouth daily.    Historical Provider, MD  JANUVIA 100 MG tablet TAKE ONE TABLET BY MOUTH ONCE DAILY 05/11/15  [name], NP  levothyroxine (SYNTHROID, LEVOTHROID) 112 MCG tablet TAKE ONE TABLET BY MOUTH TWICE DAILY 05/11/15   [name], NP  lidocaine-prilocaine (EMLA) cream Apply topically once. Patient not taking: Reported on 10/30/2015 05/05/15   [name], MD  lisinopril (PRINIVIL,ZESTRIL) 10 MG tablet Take 1 tablet (10 mg total) by mouth daily. Patient not taking: Reported on 10/26/2015 11/01/14   [name], NP  LORazepam (ATIVAN) 0.5 MG tablet Take 1 tablet (0.5 mg total) by mouth every 8 (eight) hours as needed for anxiety (or nausea). Could take 2 tab 30-45 mins prior  to PET/CT and MRI Brain Patient taking differently: Take 0.5 mg by mouth daily. Could take 2 tab 30-45 mins prior to PET/CT and MRI Brain 08/21/15   [name], MD  mirtazapine (REMERON) 15 MG tablet TAKE ONE-HALF TABLET (7.5 MG TOTAL) BY MOUTH AT BEDTIME Patient taking differently: TAKE ONE TABLET BY MOUTH AT BEDTIME 02/07/15   [name], NP  Multiple Vitamin (MULTIVITAMIN WITH MINERALS) TABS tablet Take 1 tablet by mouth daily.    Historical Provider, MD  nystatin (MYCOSTATIN) 100000 UNIT/ML suspension Take 5 mLs (500,000 Units total) by mouth 4 (four) times daily. Patient not taking: Reported on 10/30/2015 06/21/15   [name], NP  [facility] DELICA LANCETS FINE MISC Test once daily. 11/15/14   [name], NP  oxyCODONE (ROXICODONE) 5 MG/5ML solution Take 5-10 mLs (5-10 mg total) by mouth every 4 (four) hours as needed for moderate pain, severe pain or breakthrough pain. Patient not taking: Reported on 10/30/2015 01/03/15   [name], MD  polyethylene glycol [facility]) packet Take 17 g by mouth daily. Patient not taking: Reported on 10/30/2015 12/28/14   [name], MD  senna-docusate (SENNA S) 8.6-50 MG tablet Take 2 tablets by mouth at bedtime as needed for mild constipation. Patient not taking: Reported on 10/30/2015 11/09/14   [name], MD  simvastatin (ZOCOR) 20 MG tablet Take 1 tablet (20 mg total) by mouth at bedtime. 11/01/14   [name], NP  vitamin B-12 (CYANOCOBALAMIN) 1000 MCG tablet Take 1 tablet (1,000 mcg total) by mouth daily. 11/01/14   [name], NP  vitamin C (ASCORBIC ACID) 500 MG tablet Take 500 mg by mouth daily.    Historical Provider, MD    Family History Family History  Problem Relation Age of Onset  . Cancer Mother     lung non smoker  . Diabetes Mother   . Cancer Sister     colon  . Diabetes Sister   . Clotting disorder Neg Hx     Social History Social History  Substance Use Topics  . Smoking status: Former  Smoker    Packs/day: 1.50    Years: 60.00    Quit date: 09/12/2014  . Smokeless tobacco: Never Used  . Alcohol use No     Allergies   Review of patient's allergies indicates no known allergies.   Review of Systems Review of Systems  Constitutional: Negative for chills and fever.  HENT: Negative for nosebleeds.   Eyes: Negative for redness.  Respiratory: Negative for shortness of breath.   Cardiovascular: Negative for chest pain.  Gastrointestinal: Negative for abdominal pain and vomiting.  Genitourinary: Negative for flank pain.  Musculoskeletal: Negative for back pain and neck pain.  Skin: Negative for rash.  Neurological: Negative for weakness, numbness and headaches.  Hematological: Does not bruise/bleed easily.  Psychiatric/Behavioral: Negative for confusion.     Physical Exam Updated Vital Signs BP  143/88 (BP Location: Left Arm)   Pulse 98   Temp 97.6 F (36.4 C) (Oral)   Resp 16   Ht '5\' 2"'$  (1.575 m)   Wt 64.9 kg   SpO2 96%   BMI 26.16 kg/m   Physical Exam  Constitutional: She is oriented to person, place, and time. She appears well-developed and well-nourished. No distress.  HENT:  Nose: Nose normal.  Mouth/Throat: Oropharynx is clear and moist.  Abrasion/lac right eyebrow.   Eyes: Conjunctivae are normal. Pupils are equal, round, and reactive to light. No scleral icterus.  Neck: Normal range of motion. Neck supple. No tracheal deviation present.  Cardiovascular: Normal rate, regular rhythm, normal heart sounds and intact distal pulses.   Pulmonary/Chest: Effort normal and breath sounds normal. No respiratory distress. She exhibits no tenderness.  Abdominal: Soft. Normal appearance. She exhibits no distension. There is no tenderness.  Musculoskeletal: She exhibits no edema.  CTLS spine, non tender, aligned, no step off. Tenderness left upper arm and shoulder. Radial pulse 2+. No other focal bony tenderness. Abrasion right elbow.   Neurological: She is  alert and oriented to person, place, and time.  Motor intact bil. stre 5/5, sens grossly intact.   Skin: Skin is warm and dry. No rash noted.  Psychiatric: She has a normal mood and affect.  Nursing note and vitals reviewed.    ED Treatments / Results  Labs (all labs ordered are listed, but only abnormal results are displayed) Labs Reviewed - No data to display  EKG  EKG Interpretation None       Radiology Dg Shoulder Right  Result Date: 11/05/2015 CLINICAL DATA:  Right shoulder pain after a fall. EXAM: RIGHT SHOULDER - 2+ VIEW COMPARISON:  None. FINDINGS: Comminuted fractures of the proximal right humeral head and neck. Mostly transverse fracture across the surgical neck of the right humerus with mild impaction. No significant displacement. Longitudinal fractures extend up along the base of the greater tuberosity with mild displacement. No evidence of glenohumeral dislocation. Acromioclavicular and coracoclavicular spaces are maintained. Power port type central venous catheter incidentally noted. IMPRESSION: Comminuted fractures of the proximal right humerus involving the surgical neck and greater tuberosity. Electronically Signed   By: Lucienne Capers M.D.   On: 11/05/2015 00:00   Dg Humerus Right  Result Date: 11/05/2015 CLINICAL DATA:  Right arm pain after fall. EXAM: RIGHT HUMERUS - 2+ VIEW COMPARISON:  Right shoulder 11/04/2015 FINDINGS: Acute comminuted fractures of the right proximal humerus involving the surgical neck and greater tuberosity. There is impaction of the surgical neck fracture with mild posterior angulation of the distal fracture fragment. A fracture line extends into the proximal humeral shaft. The midshaft and distal right humerus appear intact. Soft tissue swelling over the upper arm consistent with hematoma. IMPRESSION: Acute comminuted fractures of the right proximal humerus involving the surgical neck and greater tuberosity with linear extension of the fracture  line into the proximal shaft. Impaction and posterior angulation of the distal fracture fragments. Electronically Signed   By: Lucienne Capers M.D.   On: 11/05/2015 00:07    Procedures Procedures (including critical care time)  Medications Ordered in ED Medications  fentaNYL (SUBLIMAZE) injection 50 mcg (not administered)     Initial Impression / Assessment and Plan / ED Course  I have reviewed the triage vital signs and the nursing notes.  Pertinent labs & imaging results that were available during my care of the patient were reviewed by me and considered in my medical decision making (  see chart for details).  Clinical Course   Iv ns. Morphine iv.  icepack to sore area.  Xrays.  Discussed xray results w pt.  Shoulder immobilizer.   No numbness/weakness. Pain controlled. Radial pulse 2+.  Tetanus im.  Patient currently appears stable for d/c.     Final Clinical Impressions(s) / ED Diagnoses   Final diagnoses:  None    New Prescriptions New Prescriptions   No medications on file     Lajean Saver, MD 11/05/15 [phone]

## 2015-11-05 DIAGNOSIS — S42211A Unspecified displaced fracture of surgical neck of right humerus, initial encounter for closed fracture: Secondary | ICD-10-CM | POA: Diagnosis not present

## 2015-11-05 MED ORDER — MORPHINE SULFATE (PF) 4 MG/ML IV SOLN
4.0000 mg | Freq: Once | INTRAVENOUS | Status: AC
  Administered 2015-11-05: 4 mg via INTRAVENOUS
  Filled 2015-11-05: qty 1

## 2015-11-05 MED ORDER — BACITRACIN ZINC 500 UNIT/GM EX OINT
TOPICAL_OINTMENT | Freq: Two times a day (BID) | CUTANEOUS | Status: DC
  Administered 2015-11-05: 01:00:00 via TOPICAL
  Filled 2015-11-05: qty 0.9

## 2015-11-05 MED ORDER — HYDROCODONE-ACETAMINOPHEN 5-325 MG PO TABS: 1.0000 | ORAL_TABLET | Freq: Four times a day (QID) | ORAL | 0 refills | Status: AC | PRN

## 2015-11-05 MED ORDER — ONDANSETRON HCL 4 MG/2ML IJ SOLN
4.0000 mg | Freq: Once | INTRAMUSCULAR | Status: AC
  Administered 2015-11-05: 4 mg via INTRAVENOUS
  Filled 2015-11-05: qty 2

## 2015-11-05 NOTE — Discharge Instructions (Signed)
It was our pleasure to provide your ER care today - we hope that you feel better.  Rest. Use shoulder immobilizer.  Coldpacks to sore area.  Keep abrasions very clean.   Take motrin or aleve as need for pain. You may also take hydrocodone as need for pain. No driving for the next 6 hours or when taking hydrocodone. Also, do not take tylenol or acetaminophen containing medication when taking hydrocodone.  Follow up with orthopedic doctor regarding your shoulder fracture this coming week - call office this Monday morning to arrange appointment.  Return to ER if worse, new symptoms, new or severe pain, numbness/weakness, faint, other concern.

## 2015-11-06 ENCOUNTER — Ambulatory Visit (HOSPITAL_COMMUNITY): Admission: RE | Admit: 2015-11-06 | Payer: Medicare Other | Source: Ambulatory Visit | Admitting: Cardiothoracic Surgery

## 2015-11-06 ENCOUNTER — Other Ambulatory Visit: Payer: Self-pay | Admitting: *Deleted

## 2015-11-06 ENCOUNTER — Other Ambulatory Visit: Payer: Medicare Other

## 2015-11-06 ENCOUNTER — Encounter (HOSPITAL_COMMUNITY): Admission: RE | Payer: Self-pay | Source: Ambulatory Visit

## 2015-11-06 ENCOUNTER — Telehealth: Payer: Self-pay | Admitting: Adult Health

## 2015-11-06 SURGERY — BRONCHOSCOPY, VIDEO-ASSISTED
Anesthesia: General

## 2015-11-06 NOTE — Telephone Encounter (Signed)
Pts daughter called to let Dr. Elease Hashimoto know that she is taking her mother to the orthopedic right now and if she needs to bring the pt in please let her know.

## 2015-11-06 NOTE — Telephone Encounter (Signed)
I called and left message with daughter-in -law to call back.  Does she need ortho referral?  If so, OK to set up.

## 2015-11-06 NOTE — Telephone Encounter (Signed)
Pt fell Saturday and broke humerus and need to get in to a Orthopedics ([facility]) today pt is in a sling.  Pts daughter Langley Gauss) called and would like to have a call back.

## 2015-11-07 ENCOUNTER — Other Ambulatory Visit: Payer: Self-pay | Admitting: *Deleted

## 2015-11-08 ENCOUNTER — Ambulatory Visit: Payer: Self-pay | Admitting: Radiation Oncology

## 2015-11-08 ENCOUNTER — Ambulatory Visit: Payer: Medicare Other | Admitting: Cardiothoracic Surgery

## 2015-11-09 ENCOUNTER — Encounter: Payer: Self-pay | Admitting: Adult Health

## 2015-11-09 ENCOUNTER — Ambulatory Visit (INDEPENDENT_AMBULATORY_CARE_PROVIDER_SITE_OTHER): Payer: Medicare Other | Admitting: Adult Health

## 2015-11-09 ENCOUNTER — Ambulatory Visit: Payer: Medicare Other | Admitting: Podiatry

## 2015-11-09 VITALS — BP 98/52 | Temp 97.7°F | Ht 62.0 in | Wt 144.1 lb

## 2015-11-09 DIAGNOSIS — Z742 Need for assistance at home and no other household member able to render care: Secondary | ICD-10-CM | POA: Diagnosis not present

## 2015-11-09 DIAGNOSIS — I9589 Other hypotension: Secondary | ICD-10-CM | POA: Diagnosis not present

## 2015-11-09 DIAGNOSIS — Z760 Encounter for issue of repeat prescription: Secondary | ICD-10-CM | POA: Diagnosis not present

## 2015-11-09 DIAGNOSIS — R42 Dizziness and giddiness: Secondary | ICD-10-CM

## 2015-11-09 MED ORDER — MIRTAZAPINE 15 MG PO TABS: 15.0000 mg | ORAL_TABLET | Freq: Every day | ORAL | 1 refills | Status: DC

## 2015-11-09 NOTE — Progress Notes (Signed)
Subjective:    Patient ID: Rachel Chapman, female    DOB: [birth year], 75 y.o.   MRN: [id number]  HPI  75 year old female who  has a past medical history of Anemia; Anxiety; Cancer ([place]); Diabetes ([place]); Dyslipidemia; History of blood transfusion; Hypertension; Hypothyroidism; Lung cancer ([place]) (8/216); Pneumonia (2007); Shortness of breath dyspnea; and Smoker. presents with her son to this visit.    Tyyonna was seen in the ER on 11/04/2015 after a mechanical fall while walking down an incline. There was no LOC.   X ray showed: IMPRESSION: Acute comminuted fractures of the right proximal humerus involving the surgical neck and greater tuberosity with linear extension of the fracture line into the proximal shaft. Impaction and posterior angulation of the distal fracture fragments.   She was placed in a sling. She has followed up with ortho and is currently taking Percocet for pain when needed.   She and her family are finding it hard to bath and dress [place] and are inquiring about home health.   There is also concern that Alene is not eating or drinking as much as they feel is appropriate. Her son reports that Meagan drinks about a bottle of water a day and may have a protein bar and small snacks throughout the day. She continues to be isolated from the family in her room.   Talking to Valera alone she reports that there is a lot of stress at home between her and her daughter in law.. " I think she is just waiting of for me to die so that she can get everything." She reports that there is constant arguing and cursing throughout the home. Her son and daughter in law fight a lot. Winta would rather just stay in her room than have to deal with all this extra stress.   During the exam today Fidelia brought up that she would like to look at retirement facilities as she feels as though this would be less stressful.   She also mentions that her cancer may have returned, she  has not had the biopsy done yet due to the fall and broken right arm. She does not know if she would want to go through chemo again.   When this was brought up to her son ( with her permission) he became agiated, standoffish and tearful. He felt as though he was not doing the right things for her ( this is not the case). He would rather not put his mother in a facility. Ultimately, it is not his decision.   After the visit was over Lujuana started walking down the hallway to the exit. She became dizzy and short of breath. She was taken back into the room for evaluation. After sitting for a few moments she was able to regain her composure. EKG showed sinus tach with a rate of 109. Blood pressure remained within normal limits.   Review of Systems  Constitutional: Positive for activity change and appetite change.  Respiratory: Positive for shortness of breath.   Musculoskeletal: Positive for arthralgias and back pain. Negative for gait problem and joint swelling.  Skin: Positive for pallor.  Neurological: Positive for dizziness.  All other systems reviewed and are negative.      Objective:   Physical Exam  Constitutional: She is oriented to person, place, and time. She appears well-developed and well-nourished. No distress.  Eyes: Conjunctivae and EOM are normal. Pupils are equal, round, and reactive to light.  Cardiovascular: Normal rate, normal heart  sounds and intact distal pulses.  Exam reveals no gallop and no friction rub.   No murmur heard. Pulmonary/Chest: Effort normal and breath sounds normal. No respiratory distress. She has no wheezes. She has no rales.  Musculoskeletal: Normal range of motion. She exhibits tenderness (right arm ).  Right arm in sling. No numbness or tingling in finger tips. Good cap refill.   Neurological: She is alert and oriented to person, place, and time.  Skin: Skin is warm and dry. No rash noted. She is not diaphoretic. No erythema. No pallor.  Appears  pale   Psychiatric: She has a normal mood and affect. Her behavior is normal. Judgment and thought content normal.  Nursing note and vitals reviewed.     Assessment & Plan:   1. Dizziness - Likely from dehydration - EKG 12-Lead - Sinus Tach, rate 109 - She needs to drink more and eat healthy food  - Follow up in one month or sooner if needed  2. Need for home health care - Ambulatory referral to [facility]  3. Medication refill - mirtazapine (REMERON) 15 MG tablet; Take 1 tablet (15 mg total) by mouth at bedtime.  Dispense: 90 tablet; Refill: 1  [name], NP

## 2015-11-13 ENCOUNTER — Ambulatory Visit: Admit: 2015-11-13 | Payer: Medicare Other | Admitting: Cardiothoracic Surgery

## 2015-11-13 ENCOUNTER — Ambulatory Visit: Payer: Medicare Other | Admitting: Hematology

## 2015-11-13 ENCOUNTER — Encounter: Payer: Self-pay | Admitting: *Deleted

## 2015-11-13 SURGERY — BRONCHOSCOPY, VIDEO-ASSISTED
Anesthesia: General

## 2015-11-13 NOTE — Progress Notes (Signed)
RN cancelled appointment today (10/2) per MD Irene Limbo. Patient will be rescheduled after lung biopsy. Patient family states that she will call once biopsy is scheduled.

## 2015-11-15 ENCOUNTER — Ambulatory Visit: Admission: RE | Admit: 2015-11-15 | Payer: Medicare Other | Source: Ambulatory Visit | Admitting: Radiation Oncology

## 2015-11-22 ENCOUNTER — Ambulatory Visit: Payer: Medicare Other | Admitting: Cardiothoracic Surgery

## 2015-11-28 ENCOUNTER — Encounter: Payer: Self-pay | Admitting: Adult Health

## 2015-11-28 ENCOUNTER — Ambulatory Visit (INDEPENDENT_AMBULATORY_CARE_PROVIDER_SITE_OTHER): Payer: Medicare Other | Admitting: Adult Health

## 2015-11-28 VITALS — BP 102/50 | Ht 62.0 in | Wt 148.2 lb

## 2015-11-28 DIAGNOSIS — Z742 Need for assistance at home and no other household member able to render care: Secondary | ICD-10-CM

## 2015-11-28 DIAGNOSIS — J209 Acute bronchitis, unspecified: Secondary | ICD-10-CM

## 2015-11-28 MED ORDER — DOXYCYCLINE HYCLATE 100 MG PO CAPS: 100.0000 mg | ORAL_CAPSULE | Freq: Two times a day (BID) | ORAL | 0 refills | Status: DC

## 2015-11-28 MED ORDER — PREDNISONE 10 MG PO TABS: ORAL_TABLET | ORAL | 0 refills | Status: DC

## 2015-11-28 MED ORDER — ALBUTEROL SULFATE (2.5 MG/3ML) 0.083% IN NEBU: 2.5000 mg | INHALATION_SOLUTION | Freq: Once | RESPIRATORY_TRACT | Status: DC

## 2015-11-28 NOTE — Progress Notes (Addendum)
Subjective:    Patient ID: Rachel Chapman, female    DOB: September 17, 1940, 75 y.o.   MRN: [id number]  Cough  This is a new problem. The current episode started 1 to 4 weeks ago (2 weeks ago ). The problem has been gradually worsening. The cough is non-productive. Associated symptoms include shortness of breath and wheezing. Pertinent negatives include no fever, nasal congestion, postnasal drip, rhinorrhea, sore throat or weight loss. Nothing aggravates the symptoms. She has tried nothing for the symptoms. Her past medical history is significant for COPD and pneumonia.   During this visit her son reports to me that he has not heard anything from home health coming to their home to help out his mother. This order was placed by me on 11/09/2015. Ressie needs assistance with bathing and dressing since falling and fracturing her right arm. She could also use OT for assistance in helping her learn how to safely dress and bath as well as PT for strengthening exercises.       Review of Systems  Constitutional: Negative for fever and weight loss.  HENT: Negative for postnasal drip, rhinorrhea and sore throat.   Respiratory: Positive for cough, shortness of breath and wheezing.   Cardiovascular: Negative.   Gastrointestinal: Negative.   Skin: Positive for pallor.  Psychiatric/Behavioral: Positive for sleep disturbance.  All other systems reviewed and are negative.  Past Medical History:  Diagnosis Date  . Anemia   . Anxiety   . Cancer ([place])   . Diabetes ([place])    Type II  . Dyslipidemia   . History of blood transfusion   . Hypertension   . Hypothyroidism   . 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 Pneumonia 2007  . Shortness of breath dyspnea    with exertion  . Smoker    1.5 packs per day for 60 years.  Quit one month ago    Social History   Social History  . Marital status: Widowed    Spouse name: N/A  . Number of children: N/A  . Years of education: N/A    Occupational History  . Not on file.   Social History Main Topics  . Smoking status: Former Smoker    Packs/day: 1.50    Years: 60.00    Quit date: 09/12/2014  . Smokeless tobacco: Never Used  . Alcohol use No  . Drug use: No  . Sexual activity: No   Other Topics Concern  . Not on file   Social History Narrative   She has moved from [place]   She is retired from working with special education    Was married widowed twice.           Past Surgical History:  Procedure Laterality Date  . CESAREAN SECTION    . EYE SURGERY Bilateral    Cataract    Family History  Problem Relation Age of Onset  . Cancer Mother     lung non smoker  . Diabetes Mother   . Cancer Sister     colon  . Diabetes Sister   . Clotting disorder Neg Hx     No Known Allergies  Current Outpatient Prescriptions on File Prior to Visit  Medication Sig Dispense Refill  . albuterol (PROVENTIL HFA;VENTOLIN HFA) 108 (90 BASE) MCG/ACT inhaler Inhale 2 puffs into the lungs every 6 (six) hours as needed for shortness of breath. 3.7 g 3  . cholecalciferol (VITAMIN D) 1000 UNITS tablet Take 1 tablet (1,000  Units total) by mouth daily. 90 tablet 3  . citalopram (CELEXA) 10 MG tablet Take 2 tablets (20 mg total) by mouth daily. 60 tablet 3  . glucose blood (ONETOUCH VERIO) test strip Test once daily. (Patient taking differently: 1 each by Other route. One to two times daily,) 100 each 5  . HYDROcodone-acetaminophen (NORCO/VICODIN) 5-325 MG tablet Take 1-2 tablets by mouth every 6 (six) hours as needed for moderate pain. 20 tablet 0  . IRON PO Take 1 tablet by mouth daily.    Marland Kitchen JANUVIA 100 MG tablet TAKE ONE TABLET BY MOUTH ONCE DAILY 30 tablet 11  . levothyroxine (SYNTHROID, LEVOTHROID) 112 MCG tablet TAKE ONE TABLET BY MOUTH TWICE DAILY 90 tablet 3  . lidocaine-prilocaine (EMLA) cream Apply topically once. 30 each 2  . lisinopril (PRINIVIL,ZESTRIL) 10 MG tablet Take 1 tablet (10 mg total) by mouth daily. 90 tablet 1   . LORazepam (ATIVAN) 0.5 MG tablet Take 1 tablet (0.5 mg total) by mouth every 8 (eight) hours as needed for anxiety (or nausea). Could take 2 tab 30-45 mins prior to PET/CT and MRI Brain (Patient taking differently: Take 0.5 mg by mouth daily. Could take 2 tab 30-45 mins prior to PET/CT and MRI Brain) 50 tablet 0  . mirtazapine (REMERON) 15 MG tablet Take 1 tablet (15 mg total) by mouth at bedtime. 90 tablet 1  . Multiple Vitamin (MULTIVITAMIN WITH MINERALS) TABS tablet Take 1 tablet by mouth daily.    Marland Kitchen nystatin (MYCOSTATIN) 100000 UNIT/ML suspension Take 5 mLs (500,000 Units total) by mouth 4 (four) times daily. 60 mL 0  . ONETOUCH DELICA LANCETS FINE MISC Test once daily. 100 each 5  . simvastatin (ZOCOR) 20 MG tablet Take 1 tablet (20 mg total) by mouth at bedtime. 90 tablet 3  . vitamin B-12 (CYANOCOBALAMIN) 1000 MCG tablet Take 1 tablet (1,000 mcg total) by mouth daily. 90 tablet 3  . vitamin C (ASCORBIC ACID) 500 MG tablet Take 500 mg by mouth daily.     Current Facility-Administered Medications on File Prior to Visit  Medication Dose Route Frequency Provider Last Rate Last Dose  . sodium chloride 0.9 % injection 10 mL  10 mL Intracatheter PRN [name], MD   10 mL at 11/11/14 1131  . sodium chloride 0.9 % injection 10 mL  10 mL Intravenous PRN [name], MD   10 mL at 01/18/15 1527    BP (!) 102/50   Ht '5\' 2"'$  (1.575 m)   Wt 148 lb 3.2 oz (67.2 kg)   BMI 27.11 kg/m       Objective:   Physical Exam  Constitutional: She is oriented to person, place, and time. She appears well-developed and well-nourished. No distress.  Cardiovascular: Normal rate, regular rhythm, normal heart sounds and intact distal pulses.  Exam reveals no gallop and no friction rub.   No murmur heard. Pulmonary/Chest: Effort normal. No respiratory distress. She has wheezes in the right upper field, the right middle field, the right lower field and the left lower field. She has no rhonchi.  She exhibits no tenderness.  Neurological: She is alert and oriented to person, place, and time.  Skin: Skin is warm and dry. No rash noted. She is not diaphoretic. No erythema. No pallor.  Psychiatric: She has a normal mood and affect. Her behavior is normal. Judgment and thought content normal.  Vitals reviewed.     Assessment & Plan:  1. Acute bronchitis, unspecified organism - predniSONE (DELTASONE)  10 MG tablet; 40 mg x 3 days, 20 mg x 3 days, 10 mg x 3 days  Dispense: 21 tablet; Refill: 0 - doxycycline (VIBRAMYCIN) 100 MG capsule; Take 1 capsule (100 mg total) by mouth 2 (two) times daily.  Dispense: 14 capsule; Refill: 0 - albuterol (PROVENTIL) (2.5 MG/3ML) 0.083% nebulizer solution 2.5 mg; Take 3 mLs (2.5 mg total) by nebulization once. - She reported feeling better after her breathing treatment. On exam she was moving air much better.  - Take Mucinex  - Follow up as needed  2. Need for home health care - Ambulatory referral to [place], NP

## 2015-11-28 NOTE — Patient Instructions (Signed)
It was great seeing you today!  I have sent in a prescription for Prednisone and Doxycycline. Please take as directed  Follow up if no improvement   I will find out what is going on with Home Health  Please start bathing. Your range of motion is getting better in the right arm and you will be ok to take the splint off for the shower.

## 2015-11-29 NOTE — Addendum Note (Signed)
Addended by: Apolinar Junes on: 11/29/2015 08:04 PM   Modules accepted: Orders

## 2015-12-04 ENCOUNTER — Telehealth: Payer: Self-pay | Admitting: Adult Health

## 2015-12-04 NOTE — Telephone Encounter (Signed)
Mickel Baas needs order for skilled nursing for twice a wk for 3 wks and then once a wk for 1 wk also home health aid for twice a wk for 2 wks and once a wk for 1 wk . Pt is not checking her bs at home. Please advise. Pt and son would like home health rn to flush her port pt is finish with chemo. . Pt seems to be more depress per depression questions ask. Pt is on celexa should she increase. On pt med list lisinopril and nystatin is listed  and per son his mother is not taking, laura need ok to removed from med list. Drug interaction reports laura found major drug interaction with celexa and zofran cause QT prolongation.

## 2015-12-05 ENCOUNTER — Telehealth: Payer: Self-pay | Admitting: Adult Health

## 2015-12-05 NOTE — Telephone Encounter (Signed)
See below

## 2015-12-05 NOTE — Telephone Encounter (Signed)
Rachel Chapman needs verbal order for OT for twice a wk for 3 wks and once a wk for 1 wk.

## 2015-12-05 NOTE — Telephone Encounter (Signed)
[place] with verbal orders for skilled nursing and home health aid.  2. I am ok with her not checking her blood sugars all the time  3. I am not getting involved in the port issue. That is going to be oncology .  4. Ok to take off Lisinopril and Nystatin  5. She is on 20 mg Celexa, I would like her to stay on that until she is seen by me or psychiatry  6. Ok to continue Celexa and Zofran

## 2015-12-05 NOTE — Telephone Encounter (Signed)
Rachel Chapman notified.

## 2015-12-05 NOTE — Telephone Encounter (Signed)
Per [name] - this is okay. Mickel Baas at [facility] notified.

## 2015-12-07 ENCOUNTER — Ambulatory Visit: Payer: Medicare Other | Admitting: Adult Health

## 2015-12-11 ENCOUNTER — Inpatient Hospital Stay (HOSPITAL_COMMUNITY): Payer: Medicare Other

## 2015-12-11 ENCOUNTER — Emergency Department (HOSPITAL_COMMUNITY): Payer: Medicare Other

## 2015-12-11 ENCOUNTER — Encounter (HOSPITAL_COMMUNITY): Payer: Self-pay

## 2015-12-11 ENCOUNTER — Inpatient Hospital Stay (HOSPITAL_COMMUNITY)
Admission: EM | Admit: 2015-12-11 | Discharge: 2015-12-16 | DRG: 988 | Disposition: A | Discharge status: Home Health | Payer: Medicare Other | Attending: Internal Medicine | Admitting: Internal Medicine

## 2015-12-11 ENCOUNTER — Telehealth: Payer: Self-pay | Admitting: *Deleted

## 2015-12-11 DIAGNOSIS — Z833 Family history of diabetes mellitus: Secondary | ICD-10-CM | POA: Diagnosis not present

## 2015-12-11 DIAGNOSIS — E039 Hypothyroidism, unspecified: Secondary | ICD-10-CM | POA: Diagnosis present

## 2015-12-11 DIAGNOSIS — E1122 Type 2 diabetes mellitus with diabetic chronic kidney disease: Secondary | ICD-10-CM | POA: Diagnosis present

## 2015-12-11 DIAGNOSIS — I129 Hypertensive chronic kidney disease with stage 1 through stage 4 chronic kidney disease, or unspecified chronic kidney disease: Secondary | ICD-10-CM | POA: Diagnosis present

## 2015-12-11 DIAGNOSIS — J90 Pleural effusion, not elsewhere classified: Secondary | ICD-10-CM | POA: Diagnosis present

## 2015-12-11 DIAGNOSIS — F411 Generalized anxiety disorder: Secondary | ICD-10-CM

## 2015-12-11 DIAGNOSIS — Z801 Family history of malignant neoplasm of trachea, bronchus and lung: Secondary | ICD-10-CM | POA: Diagnosis not present

## 2015-12-11 DIAGNOSIS — F329 Major depressive disorder, single episode, unspecified: Secondary | ICD-10-CM | POA: Diagnosis present

## 2015-12-11 DIAGNOSIS — Z87891 Personal history of nicotine dependence: Secondary | ICD-10-CM

## 2015-12-11 DIAGNOSIS — Z8489 Family history of other specified conditions: Secondary | ICD-10-CM | POA: Diagnosis not present

## 2015-12-11 DIAGNOSIS — Z9889 Other specified postprocedural states: Secondary | ICD-10-CM

## 2015-12-11 DIAGNOSIS — E1165 Type 2 diabetes mellitus with hyperglycemia: Secondary | ICD-10-CM | POA: Diagnosis present

## 2015-12-11 DIAGNOSIS — F419 Anxiety disorder, unspecified: Secondary | ICD-10-CM | POA: Diagnosis present

## 2015-12-11 DIAGNOSIS — E785 Hyperlipidemia, unspecified: Secondary | ICD-10-CM | POA: Diagnosis present

## 2015-12-11 DIAGNOSIS — F331 Major depressive disorder, recurrent, moderate: Secondary | ICD-10-CM | POA: Diagnosis not present

## 2015-12-11 DIAGNOSIS — J9601 Acute respiratory failure with hypoxia: Secondary | ICD-10-CM | POA: Diagnosis present

## 2015-12-11 DIAGNOSIS — C3491 Malignant neoplasm of unspecified part of right bronchus or lung: Secondary | ICD-10-CM

## 2015-12-11 DIAGNOSIS — E1121 Type 2 diabetes mellitus with diabetic nephropathy: Secondary | ICD-10-CM | POA: Diagnosis not present

## 2015-12-11 DIAGNOSIS — G4700 Insomnia, unspecified: Secondary | ICD-10-CM | POA: Diagnosis present

## 2015-12-11 DIAGNOSIS — R0602 Shortness of breath: Secondary | ICD-10-CM | POA: Diagnosis not present

## 2015-12-11 DIAGNOSIS — N183 Chronic kidney disease, stage 3 unspecified: Secondary | ICD-10-CM | POA: Diagnosis present

## 2015-12-11 DIAGNOSIS — R5383 Other fatigue: Secondary | ICD-10-CM | POA: Diagnosis not present

## 2015-12-11 DIAGNOSIS — Z800 Family history of malignant neoplasm of digestive organs: Secondary | ICD-10-CM | POA: Diagnosis not present

## 2015-12-11 DIAGNOSIS — Z923 Personal history of irradiation: Secondary | ICD-10-CM | POA: Diagnosis not present

## 2015-12-11 DIAGNOSIS — Z79899 Other long term (current) drug therapy: Secondary | ICD-10-CM | POA: Diagnosis not present

## 2015-12-11 DIAGNOSIS — R6510 Systemic inflammatory response syndrome (SIRS) of non-infectious origin without acute organ dysfunction: Secondary | ICD-10-CM | POA: Diagnosis present

## 2015-12-11 DIAGNOSIS — Z9221 Personal history of antineoplastic chemotherapy: Secondary | ICD-10-CM | POA: Diagnosis not present

## 2015-12-11 DIAGNOSIS — I959 Hypotension, unspecified: Secondary | ICD-10-CM | POA: Diagnosis present

## 2015-12-11 DIAGNOSIS — J9602 Acute respiratory failure with hypercapnia: Secondary | ICD-10-CM | POA: Diagnosis present

## 2015-12-11 DIAGNOSIS — Z809 Family history of malignant neoplasm, unspecified: Secondary | ICD-10-CM

## 2015-12-11 DIAGNOSIS — C3490 Malignant neoplasm of unspecified part of unspecified bronchus or lung: Secondary | ICD-10-CM

## 2015-12-11 DIAGNOSIS — Z760 Encounter for issue of repeat prescription: Secondary | ICD-10-CM

## 2015-12-11 DIAGNOSIS — R590 Localized enlarged lymph nodes: Secondary | ICD-10-CM | POA: Diagnosis present

## 2015-12-11 DIAGNOSIS — J189 Pneumonia, unspecified organism: Secondary | ICD-10-CM

## 2015-12-11 DIAGNOSIS — J9690 Respiratory failure, unspecified, unspecified whether with hypoxia or hypercapnia: Secondary | ICD-10-CM | POA: Insufficient documentation

## 2015-12-11 DIAGNOSIS — J441 Chronic obstructive pulmonary disease with (acute) exacerbation: Secondary | ICD-10-CM | POA: Diagnosis present

## 2015-12-11 DIAGNOSIS — J181 Lobar pneumonia, unspecified organism: Secondary | ICD-10-CM

## 2015-12-11 DIAGNOSIS — R918 Other nonspecific abnormal finding of lung field: Secondary | ICD-10-CM

## 2015-12-11 DIAGNOSIS — T380X5A Adverse effect of glucocorticoids and synthetic analogues, initial encounter: Secondary | ICD-10-CM | POA: Diagnosis present

## 2015-12-11 DIAGNOSIS — R739 Hyperglycemia, unspecified: Secondary | ICD-10-CM

## 2015-12-11 DIAGNOSIS — E119 Type 2 diabetes mellitus without complications: Secondary | ICD-10-CM

## 2015-12-11 LAB — BASIC METABOLIC PANEL
ANION GAP: 8 (ref 5–15)
Anion gap: 7 (ref 5–15)
BUN: 29 mg/dL — ABNORMAL HIGH (ref 6–20)
BUN: 26 mg/dL — AB (ref 6–20)
CALCIUM: 8.5 mg/dL — AB (ref 8.9–10.3)
CALCIUM: 7.9 mg/dL — AB (ref 8.9–10.3)
CO2: 26 mmol/L (ref 22–32)
CO2: 25 mmol/L (ref 22–32)
Chloride: 100 mmol/L — ABNORMAL LOW (ref 101–111)
Chloride: 100 mmol/L — ABNORMAL LOW (ref 101–111)
Creatinine, Ser: 1.36 mg/dL — ABNORMAL HIGH (ref 0.44–1.00)
Creatinine, Ser: 1.31 mg/dL — ABNORMAL HIGH (ref 0.44–1.00)
GFR calc Af Amer: 42 mL/min — ABNORMAL LOW (ref >60)
GFR calc Af Amer: 44 mL/min — ABNORMAL LOW (ref >60)
GFR calc non Af Amer: 36 mL/min — ABNORMAL LOW (ref >60)
GFR, EST NON AFRICAN AMERICAN: 38 mL/min — AB (ref >60)
GLUCOSE: 450 mg/dL — AB (ref 65–99)
GLUCOSE: 477 mg/dL — AB (ref 65–99)
Potassium: 4.5 mmol/L (ref 3.5–5.1)
Potassium: 4.6 mmol/L (ref 3.5–5.1)
Sodium: 134 mmol/L — ABNORMAL LOW (ref 135–145)
Sodium: 132 mmol/L — ABNORMAL LOW (ref 135–145)

## 2015-12-11 LAB — COMPREHENSIVE METABOLIC PANEL
ALBUMIN: 3.4 g/dL — AB (ref 3.5–5.0)
ALT: 16 U/L (ref 14–54)
AST: 17 U/L (ref 15–41)
Alkaline Phosphatase: 81 U/L (ref 38–126)
Anion gap: 7 (ref 5–15)
BUN: 28 mg/dL — AB (ref 6–20)
CHLORIDE: 100 mmol/L — AB (ref 101–111)
CO2: 27 mmol/L (ref 22–32)
Calcium: 8.5 mg/dL — ABNORMAL LOW (ref 8.9–10.3)
Creatinine, Ser: 1.37 mg/dL — ABNORMAL HIGH (ref 0.44–1.00)
GFR calc Af Amer: 42 mL/min — ABNORMAL LOW (ref >60)
GFR, EST NON AFRICAN AMERICAN: 36 mL/min — AB (ref >60)
Glucose, Bld: 391 mg/dL — ABNORMAL HIGH (ref 65–99)
POTASSIUM: 4.3 mmol/L (ref 3.5–5.1)
SODIUM: 134 mmol/L — AB (ref 135–145)
Total Bilirubin: 0.4 mg/dL (ref 0.3–1.2)
Total Protein: 6.4 g/dL — ABNORMAL LOW (ref 6.5–8.1)

## 2015-12-11 LAB — PROCALCITONIN

## 2015-12-11 LAB — CBC
HEMATOCRIT: 29.9 % — AB (ref 36.0–46.0)
Hemoglobin: 9.6 g/dL — ABNORMAL LOW (ref 12.0–15.0)
MCH: 32.5 pg (ref 26.0–34.0)
MCHC: 32.1 g/dL (ref 30.0–36.0)
MCV: 101.4 fL — AB (ref 78.0–100.0)
Platelets: 154 10*3/uL (ref 150–400)
RBC: 2.95 MIL/uL — ABNORMAL LOW (ref 3.87–5.11)
RDW: 15.8 % — AB (ref 11.5–15.5)
WBC: 10.8 10*3/uL — AB (ref 4.0–10.5)

## 2015-12-11 LAB — GLUCOSE, SEROUS FLUID: GLUCOSE FL: 431 mg/dL

## 2015-12-11 LAB — BLOOD GAS, ARTERIAL
ACID-BASE DEFICIT: 1.3 mmol/L (ref 0.0–2.0)
BICARBONATE: 25.6 mmol/L (ref 20.0–28.0)
DRAWN BY: 232811
FIO2: 1
O2 Saturation: 99.1 %
PATIENT TEMPERATURE: 97.8
pCO2 arterial: 55.2 mmHg — ABNORMAL HIGH (ref 32.0–48.0)
pH, Arterial: 7.285 — ABNORMAL LOW (ref 7.350–7.450)
pO2, Arterial: 160 mmHg — ABNORMAL HIGH (ref 83.0–108.0)

## 2015-12-11 LAB — GLUCOSE, CAPILLARY
GLUCOSE-CAPILLARY: 271 mg/dL — AB (ref 65–99)
GLUCOSE-CAPILLARY: 334 mg/dL — AB (ref 65–99)
GLUCOSE-CAPILLARY: 440 mg/dL — AB (ref 65–99)
GLUCOSE-CAPILLARY: 470 mg/dL — AB (ref 65–99)
Glucose-Capillary: 425 mg/dL — ABNORMAL HIGH (ref 65–99)
Glucose-Capillary: 341 mg/dL — ABNORMAL HIGH (ref 65–99)
Glucose-Capillary: 319 mg/dL — ABNORMAL HIGH (ref 65–99)
Glucose-Capillary: 297 mg/dL — ABNORMAL HIGH (ref 65–99)

## 2015-12-11 LAB — BODY FLUID CELL COUNT WITH DIFFERENTIAL
Lymphs, Fluid: 74 %
MONOCYTE-MACROPHAGE-SEROUS FLUID: 24 % — AB (ref 50–90)
NEUTROPHIL FLUID: 2 % (ref 0–25)
WBC FLUID: 541 uL (ref 0–1000)

## 2015-12-11 LAB — PROTIME-INR
INR: 0.97
Prothrombin Time: 12.8 seconds (ref 11.4–15.2)

## 2015-12-11 LAB — CBC WITH DIFFERENTIAL/PLATELET
BASOS ABS: 0 10*3/uL (ref 0.0–0.1)
BASOS PCT: 0 %
EOS ABS: 0.1 10*3/uL (ref 0.0–0.7)
EOS PCT: 1 %
HCT: 30.9 % — ABNORMAL LOW (ref 36.0–46.0)
Hemoglobin: 10 g/dL — ABNORMAL LOW (ref 12.0–15.0)
Lymphocytes Relative: 7 %
Lymphs Abs: 0.8 10*3/uL (ref 0.7–4.0)
MCH: 32.9 pg (ref 26.0–34.0)
MCHC: 32.4 g/dL (ref 30.0–36.0)
MCV: 101.6 fL — AB (ref 78.0–100.0)
MONO ABS: 0.6 10*3/uL (ref 0.1–1.0)
Monocytes Relative: 5 %
Neutro Abs: 9.2 10*3/uL — ABNORMAL HIGH (ref 1.7–7.7)
Neutrophils Relative %: 87 %
PLATELETS: 157 10*3/uL (ref 150–400)
RBC: 3.04 MIL/uL — ABNORMAL LOW (ref 3.87–5.11)
RDW: 15.9 % — AB (ref 11.5–15.5)
WBC: 10.6 10*3/uL — ABNORMAL HIGH (ref 4.0–10.5)

## 2015-12-11 LAB — I-STAT TROPONIN, ED: TROPONIN I, POC: 0 ng/mL (ref 0.00–0.08)

## 2015-12-11 LAB — PROTEIN, BODY FLUID: Total protein, fluid: 3.1 g/dL

## 2015-12-11 LAB — LACTIC ACID, PLASMA
Lactic Acid, Venous: 2.3 mmol/L (ref 0.5–1.9)
Lactic Acid, Venous: 1.9 mmol/L (ref 0.5–1.9)

## 2015-12-11 LAB — BRAIN NATRIURETIC PEPTIDE: B NATRIURETIC PEPTIDE 5: 135.3 pg/mL — AB (ref 0.0–100.0)

## 2015-12-11 LAB — MRSA PCR SCREENING: MRSA BY PCR: NEGATIVE

## 2015-12-11 LAB — APTT: aPTT: 25 seconds (ref 24–36)

## 2015-12-11 LAB — I-STAT CG4 LACTIC ACID, ED: Lactic Acid, Venous: 1.96 mmol/L (ref 0.5–1.9)

## 2015-12-11 MED ORDER — INSULIN ASPART 100 UNIT/ML ~~LOC~~ SOLN: 10.0000 [IU] | Freq: Once | SUBCUTANEOUS | Status: DC

## 2015-12-11 MED ORDER — IPRATROPIUM BROMIDE 0.02 % IN SOLN: 0.5000 mg | Freq: Four times a day (QID) | RESPIRATORY_TRACT | Status: DC

## 2015-12-11 MED ORDER — INSULIN ASPART 100 UNIT/ML ~~LOC~~ SOLN
5.0000 [IU] | Freq: Once | SUBCUTANEOUS | Status: AC
  Administered 2015-12-11: 5 [IU] via SUBCUTANEOUS

## 2015-12-11 MED ORDER — INSULIN ASPART 100 UNIT/ML ~~LOC~~ SOLN
0.0000 [IU] | SUBCUTANEOUS | Status: DC
  Administered 2015-12-11: 7 [IU] via SUBCUTANEOUS
  Administered 2015-12-11: 15 [IU] via SUBCUTANEOUS

## 2015-12-11 MED ORDER — INSULIN ASPART 100 UNIT/ML ~~LOC~~ SOLN
0.0000 [IU] | Freq: Three times a day (TID) | SUBCUTANEOUS | Status: DC
  Administered 2015-12-11: 4 [IU] via SUBCUTANEOUS
  Administered 2015-12-11: 11 [IU] via SUBCUTANEOUS
  Administered 2015-12-12: 15 [IU] via SUBCUTANEOUS

## 2015-12-11 MED ORDER — PIPERACILLIN-TAZOBACTAM 3.375 G IVPB
3.3750 g | Freq: Three times a day (TID) | INTRAVENOUS | Status: DC
  Administered 2015-12-11 – 2015-12-14 (×9): 3.375 g via INTRAVENOUS
  Filled 2015-12-11 (×9): qty 50

## 2015-12-11 MED ORDER — VANCOMYCIN HCL IN DEXTROSE 1-5 GM/200ML-% IV SOLN
1000.0000 mg | Freq: Every day | INTRAVENOUS | Status: DC
  Administered 2015-12-11 – 2015-12-12 (×2): 1000 mg via INTRAVENOUS
  Filled 2015-12-11 (×2): qty 200

## 2015-12-11 MED ORDER — METHYLPREDNISOLONE SODIUM SUCC 125 MG IJ SOLR
60.0000 mg | Freq: Three times a day (TID) | INTRAMUSCULAR | Status: DC
  Administered 2015-12-11 – 2015-12-13 (×7): 60 mg via INTRAVENOUS
  Filled 2015-12-11 (×7): qty 2

## 2015-12-11 MED ORDER — ONDANSETRON HCL 4 MG PO TABS: 4.0000 mg | ORAL_TABLET | Freq: Four times a day (QID) | ORAL | Status: DC | PRN

## 2015-12-11 MED ORDER — ACETAMINOPHEN 325 MG PO TABS: 650.0000 mg | ORAL_TABLET | Freq: Four times a day (QID) | ORAL | Status: DC | PRN

## 2015-12-11 MED ORDER — ALBUTEROL SULFATE (2.5 MG/3ML) 0.083% IN NEBU: 2.5000 mg | INHALATION_SOLUTION | Freq: Four times a day (QID) | RESPIRATORY_TRACT | Status: DC

## 2015-12-11 MED ORDER — SODIUM CHLORIDE 0.9 % IV BOLUS (SEPSIS)
500.0000 mL | Freq: Once | INTRAVENOUS | Status: AC
  Administered 2015-12-11: 500 mL via INTRAVENOUS

## 2015-12-11 MED ORDER — ACETAMINOPHEN 650 MG RE SUPP: 650.0000 mg | Freq: Four times a day (QID) | RECTAL | Status: DC | PRN

## 2015-12-11 MED ORDER — IPRATROPIUM-ALBUTEROL 0.5-2.5 (3) MG/3ML IN SOLN
3.0000 mL | Freq: Four times a day (QID) | RESPIRATORY_TRACT | Status: DC
  Administered 2015-12-11 (×2): 3 mL via RESPIRATORY_TRACT
  Filled 2015-12-11 (×2): qty 3

## 2015-12-11 MED ORDER — ALBUTEROL SULFATE (2.5 MG/3ML) 0.083% IN NEBU
2.5000 mg | INHALATION_SOLUTION | RESPIRATORY_TRACT | Status: DC | PRN
  Administered 2015-12-13 – 2015-12-16 (×2): 2.5 mg via RESPIRATORY_TRACT
  Filled 2015-12-11 (×4): qty 3

## 2015-12-11 MED ORDER — LORAZEPAM 2 MG/ML IJ SOLN
0.5000 mg | Freq: Four times a day (QID) | INTRAMUSCULAR | Status: DC | PRN
Start: 2015-12-11 — End: 2015-12-14
  Administered 2015-12-11 (×2): 0.5 mg via INTRAVENOUS
  Filled 2015-12-11 (×2): qty 1

## 2015-12-11 MED ORDER — PIPERACILLIN-TAZOBACTAM 3.375 G IVPB 30 MIN
3.3750 g | INTRAVENOUS | Status: AC
  Administered 2015-12-11: 3.375 g via INTRAVENOUS
  Filled 2015-12-11: qty 50

## 2015-12-11 MED ORDER — IPRATROPIUM-ALBUTEROL 0.5-2.5 (3) MG/3ML IN SOLN
3.0000 mL | Freq: Three times a day (TID) | RESPIRATORY_TRACT | Status: DC
  Administered 2015-12-11 – 2015-12-16 (×15): 3 mL via RESPIRATORY_TRACT
  Filled 2015-12-11 (×14): qty 3

## 2015-12-11 MED ORDER — SODIUM CHLORIDE 0.9% FLUSH: 10.0000 mL | INTRAVENOUS | Status: DC | PRN

## 2015-12-11 MED ORDER — ONDANSETRON HCL 4 MG/2ML IJ SOLN
4.0000 mg | Freq: Four times a day (QID) | INTRAMUSCULAR | Status: DC | PRN
Start: 2015-12-11 — End: 2015-12-16

## 2015-12-11 MED ORDER — SODIUM CHLORIDE 0.9% FLUSH
10.0000 mL | Freq: Two times a day (BID) | INTRAVENOUS | Status: DC
  Administered 2015-12-11 – 2015-12-14 (×5): 10 mL
  Administered 2015-12-15: 20 mL

## 2015-12-11 NOTE — Progress Notes (Signed)
RT removed PT from BiPAP (post Thoro) and administered neb tx. PT does not appear to be in respiratory distress at this time (states she is breathing much better and is hungry), RT will place PT on 2 lpm [place] post neb (No usage at home and will encourage RN to titrate). RN aware.

## 2015-12-11 NOTE — Progress Notes (Signed)
Initial Nutrition Assessment  DOCUMENTATION CODES:   Not applicable  INTERVENTION:  - Will order Ensure Enlive po BID, each supplement provides 350 kcal and 20 grams of protein - Continue to encourage PO intakes of meals, supplements, and fluids throughout the day. - RD will continue to monitor for additional nutrition-related needs.  NUTRITION DIAGNOSIS:   Inadequate oral intake related to acute illness, poor appetite as evidenced by per patient/family report.  GOAL:   Patient will meet greater than or equal to 90% of their needs  MONITOR:   PO intake, Supplement acceptance, Weight trends, Labs, I & O's  REASON FOR ASSESSMENT:   Malnutrition Screening Tool, Consult Poor PO  ASSESSMENT:   75 y.o. female with medical history significant of SCLC s/p radiation and chemo, COPD, DM type 2, HTN, HLD, hypothyroidism, anemia, and anxiety; who presents with worsening shortness of breath. Patient noticed that the symptoms started 3-4 weeks ago. Reports having a nonproductive cough with intermittent wheezing worse at night. She tried using home inhalers without relief of symptoms. She was seen by her primary care provider on 10/17, and was treated for acute bronchitis with prednisone taper, seven-day course of doxycycline and albuterol nebulized solution. Patient notes taking these medications as prescribed without relief of symptoms. Denies having any fever, muscle aches, hemoptysis, or recent sick contacts.  Pt seen for MST and consult. BMI indicates overweight status. No intakes documented since admission. Pt had eaten 100% of lunch (blueberry muffin and chicken salad sandwich) at time of visit. Spoke with pt and son, who is at bedside. Son reports that pt has OT, PT, and RN who go to pt's house to work with her. He states that pt works well when these individuals are in the home but afterward pt lays in bed the rest of the day and does not eat or drink well. Pt does not cook and mainly  consumes fruit, protein bars, biscuits, and drinks coffee and some flavored water.   Talked with pt about protein foods to incorporate into her diet. Also talked about the need to consume items following working with OT/PT to maintain strength and endurance. Pt reports she used to "drink water like an elephant" but that she has decreased sense of thirst and lack of interest in drinking fluids. She also states that she sleeps most of the day. Talked with pt about tips to increase fluid and also talked with her about increasing food and fluid intake to maintain strength so that she does not feel as lethargic and is able to stay awake for longer periods of time.  Son states that he grocery shops for pt and will buy her anything she requests but that if he is not home she often will not eat because she is afraid that she will fall when she stands up and/or needs to walk down steps to reach the refrigerator.   Pt reports that she has tried oral nutrition supplements such as Ensure in the past but that the smell of them makes her nauseated. She states she used to give these to her mother. Talked with pt about putting a lid on cups and drinking supplements through a straw. Pt is willing to try this; talked with RN about plan.   While RD was in the room son talked with pt about lack of PO intakes (food and drinks) and physical inactivity leading to decline and current condition. Tried to explain these same ideas to pt from an outside perspective and provide her with  ongoing encouragement.   Unable to perform physical assessment at this time. Per chart review, weight has been stable x5 months.  Medications reviewed; sliding scale Novolog, 5 units Novolog x1 dose today, 60 mg IV Solu-medrol TID, PRN Zofran.  Labs reviewed; CBGs: 334-470 mg/dL today, Na: 132 mmol/L, Cl: 100 mmol/L, BUN: 26 mg/dL, creatinine: 1.31 mg/dL, Ca: 7.9 mg/dL, GFR: 38 mL/min.    Diet Order:  Diet Carb Modified Fluid consistency: Thin;  Room service appropriate? Yes  Skin:  Reviewed, no issues  Last BM:  PTA/Unknown  Height:   Ht Readings from Last 1 Encounters:  12/11/15 '5\' 2"'$  (1.575 m)    Weight:   Wt Readings from Last 1 Encounters:  12/11/15 148 lb 2.4 oz (67.2 kg)    Ideal Body Weight:  50 kg  BMI:  Body mass index is 27.1 kg/m.  Estimated Nutritional Needs:   Kcal:  1880-2085 (28-31 kcal/kg)  Protein:  80-95 grams (1.2-1.4 grams/kg)  Fluid:  1.8 L/day  EDUCATION NEEDS:   No education needs identified at this time    Jarome Matin, MS, RD, LDN Inpatient Clinical Dietitian Pager # [phone] After hours/weekend pager # [phone]

## 2015-12-11 NOTE — Procedures (Signed)
Ultrasound-guided diagnostic and therapeutic right thoracentesis performed yielding 1liters of yellow colored fluid. No immediate complications. Follow-up chest x-ray pending.  Rachel Chapman E 10:35 AM 12/11/2015

## 2015-12-11 NOTE — ED Triage Notes (Signed)
Per EMS, pt is from home with complaints of shortness of breath. Pt was diagnosed with bronchitis 10 days ago. She was given an antibiotic and an inhaler when diagnosed. Approx. 1.5 hours ago pt started experiencing shortness of breath and used inhaler with no relief. EMS reports initially pt was wheezing and diminished throughout. Pt received '5mg'$  albuterol then pt was wheezing and diminished only in the lower lobes. EMS then administered a second neb with '5mg'$  albuterol and 0.5 of atrovent with 125 solu-medrol and 2g of magnesium sulfate intravenously. After second neb tx pt only had rhonchi on left side with no wheezing or diminished lung sounds.

## 2015-12-11 NOTE — Progress Notes (Signed)
Patient seen and evaluated earlier this AM by my associate. Please refer to H and P for details regarding assessment and plan.   Have placed order for right ultrasound guided thoracentesis. Also started on SSI  Gen: pt in nad, alert and awake Pulm: on bipap, equal chest rise, diminished breath sounds at right lung field CV: s1 and s2 wnl, no rubs  Will reassess next am.  Velvet Bathe

## 2015-12-11 NOTE — ED Provider Notes (Signed)
[facility] DEPT Provider Note   CSN: [id number] Arrival date & time: 12/11/15  0103 By signing my name below, I, Higinio Plan, attest that this documentation has been prepared under the direction and in the presence of Orpah Greek, MD . Electronically Signed: Higinio Plan, Scribe. 12/11/2015. 1:14 AM.  History   Chief Complaint Chief Complaint  Patient presents with  . Shortness of Breath   The history is provided by the patient. No language interpreter was used.   HPI Comments: Rachel Chapman is a 75 y.o. female with PMHx of lung cancer, pneumonia and type II DM, brought in by EMS to the Emergency Department complaining of gradually worsening, shortness of breath. Pt reports she was recently diagnosed with bronchitis and noticed her symptoms worsened "within the past couple of weeks." She notes her shortness of breath is worse at night compared to during the day. She states associated dry cough and wheezing. Pt reports she has an inhaler at home but has not used it for her symptoms. She denies fever.   Past Medical History:  Diagnosis Date  . Anemia   . Anxiety   . Cancer ([place])   . Diabetes ([place])    Type II  . Dyslipidemia   . History of blood transfusion   . Hypertension   . Hypothyroidism   . Lung cancer ([place]) 8/216   hilar/mediastinum lung scca  . Pneumonia 2007  . Shortness of breath dyspnea    with exertion  . Smoker    1.5 packs per day for 60 years.  Quit one month ago    Patient Active Problem List   Diagnosis Date Noted  . Risk of brain metastases warranting prophylactic brain radiation 03/15/2015  . Anemia due to antineoplastic chemotherapy 01/18/2015  . Nausea with vomiting   . Symptomatic anemia   . Protein-calorie malnutrition, severe ([place]) 12/13/2014  . Hyponatremia 12/13/2014  . Orthostatic hypotension 12/13/2014  . Neoplasm related pain 12/13/2014  . Nausea without vomiting 12/13/2014  . Dehydration 12/12/2014  . Radiation-induced  esophagitis 12/12/2014  . Hypotension 12/12/2014  . Antineoplastic chemotherapy induced pancytopenia ([place]) 12/12/2014  . Diabetes type 2, controlled ([place]) 11/01/2014  . Hypothyroidism 11/01/2014  . Hyperlipidemia 11/01/2014  . 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 CESAREAN SECTION    . EYE SURGERY Bilateral    Cataract    OB History    No data available     Home Medications    Prior to Admission medications   Medication Sig Start Date End Date Taking? Authorizing Provider  albuterol (PROVENTIL HFA;VENTOLIN HFA) 108 (90 BASE) MCG/ACT inhaler Inhale 2 puffs into the lungs every 6 (six) hours as needed for shortness of breath. 11/01/14   [name], NP  cholecalciferol (VITAMIN D) 1000 UNITS tablet Take 1 tablet (1,000 Units total) by mouth daily. 11/01/14   [name], NP  citalopram (CELEXA) 10 MG tablet Take 2 tablets (20 mg total) by mouth daily. 07/05/15   [name], MD  doxycycline (VIBRAMYCIN) 100 MG capsule Take 1 capsule (100 mg total) by mouth 2 (two) times daily. 11/28/15   [name], NP  glucose blood (ONETOUCH VERIO) test strip Test once daily. Patient taking differently: 1 each by Other route. One to two times daily, 11/15/14   [name], NP  HYDROcodone-acetaminophen (NORCO/VICODIN) 5-325 MG tablet Take 1-2 tablets by mouth every 6 (six) hours as needed for moderate pain. 11/05/15  Lajean Saver, MD  IRON PO Take 1 tablet by mouth daily.    Historical Provider, MD  JANUVIA 100 MG tablet TAKE ONE TABLET BY MOUTH ONCE DAILY 05/11/15   [name], NP  levothyroxine (SYNTHROID, LEVOTHROID) 112 MCG tablet TAKE ONE TABLET BY MOUTH TWICE DAILY 05/11/15   [name], NP  lidocaine-prilocaine (EMLA) cream Apply topically once. 05/05/15   [name], MD  lisinopril (PRINIVIL,ZESTRIL) 10 MG tablet Take 1 tablet (10 mg total) by mouth daily. 11/01/14   [name], NP  LORazepam  (ATIVAN) 0.5 MG tablet Take 1 tablet (0.5 mg total) by mouth every 8 (eight) hours as needed for anxiety (or nausea). Could take 2 tab 30-45 mins prior to PET/CT and MRI Brain Patient taking differently: Take 0.5 mg by mouth daily. Could take 2 tab 30-45 mins prior to PET/CT and MRI Brain 08/21/15   [name], MD  mirtazapine (REMERON) 15 MG tablet Take 1 tablet (15 mg total) by mouth at bedtime. 11/09/15   [name], NP  Multiple Vitamin (MULTIVITAMIN WITH MINERALS) TABS tablet Take 1 tablet by mouth daily.    Historical Provider, MD  nystatin (MYCOSTATIN) 100000 UNIT/ML suspension Take 5 mLs (500,000 Units total) by mouth 4 (four) times daily. 06/21/15   [name], NP  [facility] DELICA LANCETS FINE MISC Test once daily. 11/15/14   [name], NP  predniSONE (DELTASONE) 10 MG tablet 40 mg x 3 days, 20 mg x 3 days, 10 mg x 3 days 11/28/15   [name], NP  simvastatin (ZOCOR) 20 MG tablet Take 1 tablet (20 mg total) by mouth at bedtime. 11/01/14   [name], NP  vitamin B-12 (CYANOCOBALAMIN) 1000 MCG tablet Take 1 tablet (1,000 mcg total) by mouth daily. 11/01/14   [name], NP  vitamin C (ASCORBIC ACID) 500 MG tablet Take 500 mg by mouth daily.    Historical Provider, MD    Family History Family History  Problem Relation Age of Onset  . Cancer Mother     lung non smoker  . Diabetes Mother   . Cancer Sister     colon  . Diabetes Sister   . Clotting disorder Neg Hx     Social History Social History  Substance Use Topics  . Smoking status: Former Smoker    Packs/day: 1.50    Years: 60.00    Quit date: 09/12/2014  . Smokeless tobacco: Never Used  . Alcohol use No     Allergies   Review of patient's allergies indicates no known allergies.   Review of Systems Review of Systems  Constitutional: Negative for fever.  Respiratory: Positive for cough, shortness of breath and wheezing.   All other systems reviewed and are negative.  Physical Exam Updated  Vital Signs BP 107/58 (BP Location: Left Arm)   Pulse 106   Temp 97.6 F (36.4 C) (Oral)   Resp 20   SpO2 100%   Physical Exam  Constitutional: She is oriented to person, place, and time. She appears well-developed and well-nourished. No distress.  HENT:  Head: Normocephalic and atraumatic.  Right Ear: Hearing normal.  Left Ear: Hearing normal.  Nose: Nose normal.  Mouth/Throat: Oropharynx is clear and moist and mucous membranes are normal.  Eyes: Conjunctivae and EOM are normal. Pupils are equal, round, and reactive to light.  Neck: Normal range of motion. Neck supple.  Cardiovascular: Regular rhythm, S1 normal and S2 normal.  Exam reveals no gallop and no friction rub.   No  murmur heard. Pulmonary/Chest: She has wheezes. She exhibits no tenderness.  Crackles and wheeze on left and absent breath sounds on right   Abdominal: Soft. Normal appearance and bowel sounds are normal. There is no hepatosplenomegaly. There is no tenderness. There is no rebound, no guarding, no tenderness at McBurney's point and negative Murphy's sign. No hernia.  Musculoskeletal: Normal range of motion.  Neurological: She is alert and oriented to person, place, and time. She has normal strength. No cranial nerve deficit or sensory deficit. Coordination normal. GCS eye subscore is 4. GCS verbal subscore is 5. GCS motor subscore is 6.  Skin: Skin is warm, dry and intact. No rash noted. No cyanosis.  Psychiatric: She has a normal mood and affect. Her speech is normal and behavior is normal. Thought content normal.  Nursing note and vitals reviewed.  ED Treatments / Results  Labs (all labs ordered are listed, but only abnormal results are displayed) Labs Reviewed  CBC WITH DIFFERENTIAL/PLATELET - Abnormal; Notable for the following:       Result Value   WBC 10.6 (*)    RBC 3.04 (*)    Hemoglobin 10.0 (*)    HCT 30.9 (*)    MCV 101.6 (*)    RDW 15.9 (*)    Neutro Abs 9.2 (*)    All other components within  normal limits  COMPREHENSIVE METABOLIC PANEL - Abnormal; Notable for the following:    Sodium 134 (*)    Chloride 100 (*)    Glucose, Bld 391 (*)    BUN 28 (*)    Creatinine, Ser 1.37 (*)    Calcium 8.5 (*)    Total Protein 6.4 (*)    Albumin 3.4 (*)    GFR calc non Af Amer 36 (*)    GFR calc Af Amer 42 (*)    All other components within normal limits  I-STAT CG4 LACTIC ACID, ED - Abnormal; Notable for the following:    Lactic Acid, Venous 1.96 (*)    All other components within normal limits  BRAIN NATRIURETIC PEPTIDE  BLOOD GAS, ARTERIAL  I-STAT TROPOININ, ED    EKG  EKG Interpretation None       Radiology Dg Chest Port 1 View  Result Date: 12/11/2015 CLINICAL DATA:  Acute onset of shortness of breath. Recently diagnosed with bronchitis. Initial encounter. EXAM: PORTABLE CHEST 1 VIEW COMPARISON:  PET/CT performed 10/11/2015 FINDINGS: A moderate right-sided pleural effusion is noted, with right-sided airspace opacity. This may reflect pneumonia. Underlying recurrent malignancy is a concern. The left lung appears relatively clear.  No pneumothorax is seen. The cardiomediastinal silhouette is enlarged. Right perihilar postradiation change is again noted. A right-sided chest port is noted ending about the mid SVC. No acute osseous abnormalities are seen. IMPRESSION: Moderate right-sided pleural effusion, with right-sided airspace opacity. This may reflect pneumonia. Underlying recurrent malignancy is a concern. Diagnostic thoracentesis could be considered for further evaluation, or PET/CT could be considered 3-4 weeks after completion of treatment for pneumonia. Electronically Signed   By: Garald Balding M.D.   On: 12/11/2015 01:35    Procedures Procedures (including critical care time)  Medications Ordered in ED Medications - No data to display  DIAGNOSTIC STUDIES:  Oxygen Saturation is 96% on [place], RA by my interpretation.    COORDINATION OF CARE:  1:11 AM Discussed  treatment plan with pt at bedside and pt agreed to plan.  Initial Impression / Assessment and Plan / ED Course  I have reviewed the triage  vital signs and the nursing notes.  Pertinent labs & imaging results that were available during my care of the patient were reviewed by me and considered in my medical decision making (see chart for details).  Clinical Course   Patient presents with complaints of difficulty breathing. Patient has history of recurrent lung cancer. She also has COPD. She reports that she has had "bronchitis" for many weeks. Her breathing has progressively worsened. She is brought to the ER by ambulance from home. She was in respiratory distress. Patient was found to be very hypoxic and initiated on nonrebreather oxygen. She was also administered 10 mg of albuterol, 0.5 mg of Atrovent, 125 mg of Solu-Medrol and 2 g of magnesium during transport.  Examination at arrival revealed absent lung sounds on the right. Chest x-ray confirms large effusion with continued pneumonia. Cannot rule out that this is mass or postobstructive pneumonia. Will require empiric antibiotic coverage. Patient does not appear to be septic at this time.  Blood Gas shows hypercarbia with acute respiratory acidosis. As patient's work of breathing is increased, will place on BiPAP.  CRITICAL CARE Performed by: Orpah Greek   Total critical care time: 30 minutes  Critical care time was exclusive of separately billable procedures and treating other patients.  Critical care was necessary to treat or prevent imminent or life-threatening deterioration.  Critical care was time spent personally by me on the following activities: development of treatment plan with patient and/or surrogate as well as nursing, discussions with consultants, evaluation of patient's response to treatment, examination of patient, obtaining history from patient or surrogate, ordering and performing treatments and  interventions, ordering and review of laboratory studies, ordering and review of radiographic studies, pulse oximetry and re-evaluation of patient's condition.   I personally performed the services described in this documentation, which was scribed in my presence. The recorded information has been reviewed and is accurate.   Final Clinical Impressions(s) / ED Diagnoses   Final diagnoses:  Pleural effusion  Malignant neoplasm of right lung, unspecified part of lung ([place])  Acute respiratory failure with hypoxia and hypercapnia ([place])  Community acquired pneumonia of right lower lobe of lung ([place])  Hyperglycemia    New Prescriptions New Prescriptions   No medications on file     Orpah Greek, MD 12/11/15 [phone]

## 2015-12-11 NOTE — Progress Notes (Signed)
Pt transported from ED to ICU 1240 on bipap without incident.  Pt tolerated well.  RT will continue to monitor pt.

## 2015-12-11 NOTE — ED Notes (Signed)
Bed: WA03 Expected date:  Expected time:  Means of arrival:  Comments: F/ Shortness of breath

## 2015-12-11 NOTE — ED Notes (Signed)
Family contact information for patient:  Brinleigh Tew (Daughter) [phone]  Kaitlynn Tramontana (Son) [phone]

## 2015-12-11 NOTE — Care Management Note (Signed)
Case Management Note  Patient Details  Name: Rachel Chapman MRN: [id number] Date of Birth: [birth year]  Subjective/Objective:        resp failure with history of lung ca and copd/on bipap            Action/Plan: tbd   Expected Discharge Date:                  Expected Discharge Plan:  Home/Self Care  In-House Referral:     Discharge planning Services     Post Acute Care Choice:    Choice offered to:     DME Arranged:    DME Agency:     HH Arranged:    [place] Agency:     Status of Service:  In process, will continue to follow  If discussed at Long Length of Stay Meetings, dates discussed:    Additional Comments:Date:  December 11, 2015 Chart reviewed for concurrent status and case management needs. Will continue to follow the patient for status change: Discharge Planning: following for needs Expected discharge date: [id number] Velva Harman, BSN, [place], [place] Leeroy Cha, RN 12/11/2015, 10:17 AM

## 2015-12-11 NOTE — Telephone Encounter (Signed)
Received call from [place], [place] @ [facility] ICU re:   Pt was admitted this am with Shortness of Breath.  Pt would like to let Dr. Irene Limbo know of her admission -  Room  1240.

## 2015-12-11 NOTE — H&P (Signed)
History and Physical    Rachel Chapman YHC:[id number] DOB: 12/27/1940 DOA: 12/11/2015  Referring MD/NP/PA: Dr. Betsey Holiday PCP: [name], NP  Patient coming from:   Chief Complaint: Shortness of breath  HPI: Rachel Chapman is a 75 y.o. female with medical history significant of SCLC s/p radiation and chemo, COPD, DM type 2, HTN, HLD, hypothyroidism, anemia, and anxiety; who presents with worsening shortness of breath. Patient noticed that the symptoms started 3-4 weeks ago. Reports having a nonproductive cough with intermittent wheezing worse at night. She tried using home inhalers without relief of symptoms. She was seen by her primary care provider on 10/17, and was treated for acute bronchitis with prednisone taper, seven-day course of doxycycline and albuterol nebulized solution. Patient notes taking these medications as prescribed without relief of symptoms. Denies having any fever, muscle aches, hemoptysis, or recent sick contacts.  She had been scheduled to have a vidoeo bronchoscopy last month with Dr. Tharon Aquas Tright.  This was after 6 month follow-up PET scan revealed new hypermetabolic activity in the right lung. However, last month patient noted breaking her right arm after a slip and fall for which the patient seizure got canceled and has not been scheduled yet.  ED Course: EMS evaluated the patient and found O2 sats in the 70s on room air. She was given two nebulizer treatments with 72m albuterol and 0.5 of atrovent, 125 mg of solu-medrol,  2g of magnesium sulfate intravenously, and placed on a nonrebreather. Once in the emergency room patient was found to be afebrile, heart rates 106-108, respirations 20-26, blood pressure as low as 90/53. Patient was switched to a BiPAP mask. Initial ABG taken while patient was on NRB revealed pH 7.285, PCO2 55.2, PCO2 106. Initial chest x-ray showed significant right-sided pleural effusion. WBC 10.6, hemoglobin 10, BUN 28, creatinine 1.37,  BNP 135.3, lactic acid 1.96, Patient met SIRS/sepsis criteria which she was empirically placed on vancomycin and cefepime for possible postobstructive pneumonia given immunocompromise history.  Review of Systems: As per HPI otherwise 10 point review of systems negative.   Past Medical History:  Diagnosis Date  . Anemia   . Anxiety   . [place]   . [place]    Type II  . Dyslipidemia   . History of blood transfusion   . Hypertension   . Hypothyroidism   . [place] 8[place]216   hilar/mediastinum lung scca  . Pneumonia 2007  . Shortness of breath dyspnea    with exertion  . Smoker    1.5 packs per day for 60 years.  Quit one month ago    Past Surgical History:  Procedure Laterality Date  . CESAREAN SECTION    . EYE SURGERY Bilateral    Cataract     reports that she quit smoking about 14 months ago. She has a 90.00 pack-year smoking history. She has never used smokeless tobacco. She reports that she does not drink alcohol or use drugs.  No Known Allergies  Family History  Problem Relation Age of Onset  . Cancer Mother     lung non smoker  . Diabetes Mother   . Cancer Sister     colon  . Diabetes Sister   . Clotting disorder Neg Hx     Prior to Admission medications   Medication Sig Start Date End Date Taking? Authorizing Provider  albuterol (PROVENTIL HFA;VENTOLIN HFA) 108 (90 BASE) MCG/ACT inhaler Inhale 2 puffs into the lungs every 6 (six) hours as needed for shortness  of breath. 11/01/14   [name], NP  cholecalciferol (VITAMIN D) 1000 UNITS tablet Take 1 tablet (1,000 Units total) by mouth daily. 11/01/14   [name], NP  citalopram (CELEXA) 10 MG tablet Take 2 tablets (20 mg total) by mouth daily. 07/05/15   [name], MD  doxycycline (VIBRAMYCIN) 100 MG capsule Take 1 capsule (100 mg total) by mouth 2 (two) times daily. 11/28/15   [name], NP  glucose blood (ONETOUCH VERIO) test strip Test once daily. Patient taking  differently: 1 each by Other route. One to two times daily, 11/15/14   [name], NP  HYDROcodone-acetaminophen (NORCO/VICODIN) 5-325 MG tablet Take 1-2 tablets by mouth every 6 (six) hours as needed for moderate pain. 11/05/15   Lajean Saver, MD  IRON PO Take 1 tablet by mouth daily.    Historical Provider, MD  JANUVIA 100 MG tablet TAKE ONE TABLET BY MOUTH ONCE DAILY 05/11/15   [name], NP  levothyroxine (SYNTHROID, LEVOTHROID) 112 MCG tablet TAKE ONE TABLET BY MOUTH TWICE DAILY 05/11/15   [name], NP  lidocaine-prilocaine (EMLA) cream Apply topically once. 05/05/15   [name], MD  lisinopril (PRINIVIL,ZESTRIL) 10 MG tablet Take 1 tablet (10 mg total) by mouth daily. 11/01/14   [name], NP  LORazepam (ATIVAN) 0.5 MG tablet Take 1 tablet (0.5 mg total) by mouth every 8 (eight) hours as needed for anxiety (or nausea). Could take 2 tab 30-45 mins prior to PET/CT and MRI Brain Patient taking differently: Take 0.5 mg by mouth daily. Could take 2 tab 30-45 mins prior to PET/CT and MRI Brain 08/21/15   [name], MD  mirtazapine (REMERON) 15 MG tablet Take 1 tablet (15 mg total) by mouth at bedtime. 11/09/15   [name], NP  Multiple Vitamin (MULTIVITAMIN WITH MINERALS) TABS tablet Take 1 tablet by mouth daily.    Historical Provider, MD  nystatin (MYCOSTATIN) 100000 UNIT/ML suspension Take 5 mLs (500,000 Units total) by mouth 4 (four) times daily. 06/21/15   [name], NP  [facility] DELICA LANCETS FINE MISC Test once daily. 11/15/14   [name], NP  predniSONE (DELTASONE) 10 MG tablet 40 mg x 3 days, 20 mg x 3 days, 10 mg x 3 days 11/28/15   [name], NP  simvastatin (ZOCOR) 20 MG tablet Take 1 tablet (20 mg total) by mouth at bedtime. 11/01/14   [name], NP  vitamin B-12 (CYANOCOBALAMIN) 1000 MCG tablet Take 1 tablet (1,000 mcg total) by mouth daily. 11/01/14   [name], NP  vitamin C (ASCORBIC ACID) 500 MG tablet Take 500 mg by mouth daily.     Historical Provider, MD    Physical Exam:  Constitutional: Elderly female in some moderate respiratory distress on BiPAP mask Vitals:   12/11/15 0103 12/11/15 0105 12/11/15 0119 12/11/15 0220  BP:  107/58  (!) 90/53  Pulse:  106  108  Resp:  20  26  Temp:  97.6 F (36.4 C)    TempSrc:  Oral    SpO2: 96% 100% 100% 95%   Eyes: PERRL, lids and conjunctivae normal ENMT: Mucous membranes are moist. Posterior pharynx clear of any exudate or lesions. Neck: normal, supple, no masses, no thyromegaly Respiratory: Tachypneic with decreased overall breath sounds on the right lung field. Wheezes heard on the left lung field. Cardiovascular: Tachycardic. no murmurs / rubs / gallops. No extremity edema. 2+ pedal pulses. No carotid bruits.  Abdomen: no tenderness, no masses palpated. No hepatosplenomegaly. Bowel sounds  positive.  Musculoskeletal: no clubbing / cyanosis. No joint deformity upper and lower extremities. Good ROM, no contractures. Normal muscle tone.  Skin: no rashes, lesions, ulcers. No induration Neurologic: CN 2-12 grossly intact. Sensation intact, DTR normal. Strength 5/5 in all 4.  Psychiatric: Normal judgment and insight. Alert and oriented x 3. anxiousl mood.     Labs on Admission: I have personally reviewed following labs and imaging studies  CBC:  Recent Labs Lab 12/11/15 0137  WBC 10.6*  NEUTROABS 9.2*  HGB 10.0*  HCT 30.9*  MCV 101.6*  PLT 852   Basic Metabolic Panel:  Recent Labs Lab 12/11/15 0137  NA 134*  K 4.3  CL 100*  CO2 27  GLUCOSE 391*  BUN 28*  CREATININE 1.37*  CALCIUM 8.5*   GFR: Estimated Creatinine Clearance: 30.9 mL/min (by C-G formula based on SCr of 1.37 mg/dL (H)). Liver Function Tests:  Recent Labs Lab 12/11/15 0137  AST 17  ALT 16  ALKPHOS 81  BILITOT 0.4  PROT 6.4*  ALBUMIN 3.4*   No results for input(s): LIPASE, AMYLASE in the last 168 hours. No results for input(s): AMMONIA in the last 168 hours. Coagulation  Profile: No results for input(s): INR, PROTIME in the last 168 hours. Cardiac Enzymes: No results for input(s): CKTOTAL, CKMB, CKMBINDEX, TROPONINI in the last 168 hours. BNP (last 3 results) No results for input(s): PROBNP in the last 8760 hours. HbA1C: No results for input(s): HGBA1C in the last 72 hours. CBG: No results for input(s): GLUCAP in the last 168 hours. Lipid Profile: No results for input(s): CHOL, HDL, LDLCALC, TRIG, CHOLHDL, LDLDIRECT in the last 72 hours. Thyroid Function Tests: No results for input(s): TSH, T4TOTAL, FREET4, T3FREE, THYROIDAB in the last 72 hours. Anemia Panel: No results for input(s): VITAMINB12, FOLATE, FERRITIN, TIBC, IRON, RETICCTPCT in the last 72 hours. Urine analysis:    Component Value Date/Time   COLORURINE YELLOW 11/25/2014 [place] 11/25/2014 1526   LABSPEC 1.006 11/25/2014 1526   PHURINE 6.5 11/25/2014 1526   GLUCOSEU NEGATIVE 11/25/2014 1526   HGBUR NEGATIVE 11/25/2014 [place] 11/25/2014 [place] 11/25/2014 1526   PROTEINUR NEGATIVE 11/25/2014 1526   UROBILINOGEN 0.2 11/25/2014 1526   NITRITE NEGATIVE 11/25/2014 1526   LEUKOCYTESUR TRACE (A) 11/25/2014 1526   Sepsis Labs: No results found for this or any previous visit (from the past 240 hour(s)).   Radiological Exams on Admission: Dg Chest Port 1 View  Result Date: 12/11/2015 CLINICAL DATA:  Acute onset of shortness of breath. Recently diagnosed with bronchitis. Initial encounter. EXAM: PORTABLE CHEST 1 VIEW COMPARISON:  PET/CT performed 10/11/2015 FINDINGS: A moderate right-sided pleural effusion is noted, with right-sided airspace opacity. This may reflect pneumonia. Underlying recurrent malignancy is a concern. The left lung appears relatively clear.  No pneumothorax is seen. The cardiomediastinal silhouette is enlarged. Right perihilar postradiation change is again noted. A right-sided chest port is noted ending about the  mid SVC. No acute osseous abnormalities are seen. IMPRESSION: Moderate right-sided pleural effusion, with right-sided airspace opacity. This may reflect pneumonia. Underlying recurrent malignancy is a concern. Diagnostic thoracentesis could be considered for further evaluation, or PET/CT could be considered 3-4 weeks after completion of treatment for pneumonia. Electronically Signed   By: Garald Balding M.D.   On: 12/11/2015 01:35      Assessment/Plan Respiratory failure with hypercapnia and hypoxia/ COPD exacerbation: Acute. Patient with cough and wheezing. Treated previously for possible bronchitis with doxycycline,  prednisone, and albuterol nebulizers without relief of symptoms. X-rays showing significant right-sided pleural effusion with possible infiltrate. Patient placed on BiPAP After initial ABG showed pH of 7.28, PCO2 55, PO2 106. - Admit to stepdown - NPO for now - Continue BiPAP - DuoNeb's q6 hours and prn  - Solu-Medrol IV  Suspected malignant pleural effusion on right/ SCLC s/p chemo and radiatinon: Acute. Patient was just recently seen have new spot on 6 month follow-up scans, but never was able to with Dr.Van Trigt to have video bronchoscopy. - Consult to interventional radiology for therapeutic thoracentesis in a.m. - Notify Dr. Irene Limbo of oncology in am that the patient is in the hospital  SIRS/Sepsis 2/2 possible postobstructive pneumonia : Acute. Patient with elevated WBC of 10.6, lactic acid 1.96, x-ray showing right-sided pleural effusion with possible infiltrate. Suspect this would be a postobstructive pneumonia likely related to above. - Empiric antibiotics of vancomycin and cefepime - Follow-up cultures - Trend lactic acid levels  Hypotension: Patient with systolic blood pressures in the 90-100s as seen per review of records. - Continue to monitor  Diabetes mellitus type 2: Patient with a glucose elevated at 391 suspect this is secondary to recent steroids. - Hold  Januvia - Hypoglycemic protocols - CBGs every every 4 hours with sensitive a sliding scale insulin  Chronic kidney disease stage III: Creatinine 1.37 with a BUN of 28 is appears near patient's baseline. - Continue to monitor  Anemia: Hemoglobin initially 10 on admission, previously baseline hemoglobin was noted to be 11.5. Patient with elevated MCH and MCV. - Continue to monitor and transfuse as needed - May consider need to check folate and vitamin B12(previously >1500 in 06/2015)  Hypothyroidism - Consider need to convert levothyroxine to IV if patient requiring prolonged BiPAP therapy  DVT prophylaxis: SCDs Code Status:full  Family Communication: No family present at bedside Disposition Plan: TBD Consults called: None Admission status: Inpatient   Norval Morton MD [facility] Pager [phone]  If 7PM-7AM, please contact night-coverage [email or web address] Password [email or web address]  12/11/2015, 2:49 AM

## 2015-12-11 NOTE — Progress Notes (Signed)
Pharmacy Antibiotic Note  Rachel Chapman is a 75 y.o. female admitted on 12/11/2015 with post obstructive pna .  Pharmacy has been consulted for zosyn/vancomycin dosing.  Plan: Zosyn 3.375g IV q8h (4 hour infusion).  F/u Scr, borderline for EI dosing Vancomycin 1Gm IV q24h (VT=15-20 mg/L)     Temp (24hrs), Avg:97.6 F (36.4 C), Min:97.6 F (36.4 C), Max:97.6 F (36.4 C)   Recent Labs Lab 12/11/15 0137 12/11/15 0152  WBC 10.6*  --   CREATININE 1.37*  --   LATICACIDVEN  --  1.96*    Estimated Creatinine Clearance: 30.9 mL/min (by C-G formula based on SCr of 1.37 mg/dL (H)).    No Known Allergies  Antimicrobials this admission: 10/30 zosyn >>  10/30 vancomycin >>   Dose adjustments this admission:   Microbiology results:  BCx:   UCx:    Sputum:    MRSA PCR:   Thank you for allowing pharmacy to be a part of this patient's care.  Dorrene German 12/11/2015 3:04 AM

## 2015-12-12 LAB — GLUCOSE, CAPILLARY
GLUCOSE-CAPILLARY: 277 mg/dL — AB (ref 65–99)
GLUCOSE-CAPILLARY: 374 mg/dL — AB (ref 65–99)
Glucose-Capillary: 398 mg/dL — ABNORMAL HIGH (ref 65–99)
Glucose-Capillary: 313 mg/dL — ABNORMAL HIGH (ref 65–99)
Glucose-Capillary: 369 mg/dL — ABNORMAL HIGH (ref 65–99)

## 2015-12-12 MED ORDER — INSULIN ASPART 100 UNIT/ML ~~LOC~~ SOLN
0.0000 [IU] | Freq: Three times a day (TID) | SUBCUTANEOUS | Status: DC
Start: 2015-12-12 — End: 2015-12-16
  Administered 2015-12-12: 11 [IU] via SUBCUTANEOUS
  Administered 2015-12-12: 15 [IU] via SUBCUTANEOUS
  Administered 2015-12-13 (×2): 11 [IU] via SUBCUTANEOUS
  Administered 2015-12-14: 18 [IU] via SUBCUTANEOUS
  Administered 2015-12-14: 5 [IU] via SUBCUTANEOUS
  Administered 2015-12-15: 2 [IU] via SUBCUTANEOUS
  Administered 2015-12-15 – 2015-12-16 (×2): 8 [IU] via SUBCUTANEOUS

## 2015-12-12 MED ORDER — LEVOTHYROXINE SODIUM 112 MCG PO TABS
112.0000 ug | ORAL_TABLET | Freq: Every day | ORAL | Status: DC
  Administered 2015-12-12 – 2015-12-16 (×5): 112 ug via ORAL
  Filled 2015-12-12 (×5): qty 1

## 2015-12-12 MED ORDER — ENSURE ENLIVE PO LIQD: 237.0000 mL | Freq: Two times a day (BID) | ORAL | Status: DC | PRN

## 2015-12-12 MED ORDER — MIRTAZAPINE 15 MG PO TABS
15.0000 mg | ORAL_TABLET | Freq: Every day | ORAL | Status: DC
  Administered 2015-12-12 – 2015-12-15 (×4): 15 mg via ORAL
  Filled 2015-12-12 (×4): qty 1

## 2015-12-12 MED ORDER — INSULIN ASPART 100 UNIT/ML ~~LOC~~ SOLN
0.0000 [IU] | Freq: Every day | SUBCUTANEOUS | Status: DC
  Administered 2015-12-12: 5 [IU] via SUBCUTANEOUS
  Administered 2015-12-13: 4 [IU] via SUBCUTANEOUS
  Administered 2015-12-14 – 2015-12-15 (×2): 5 [IU] via SUBCUTANEOUS

## 2015-12-12 MED ORDER — CITALOPRAM HYDROBROMIDE 20 MG PO TABS
20.0000 mg | ORAL_TABLET | Freq: Every day | ORAL | Status: DC
  Administered 2015-12-12 – 2015-12-14 (×3): 20 mg via ORAL
  Filled 2015-12-12 (×3): qty 1

## 2015-12-12 MED ORDER — INSULIN DETEMIR 100 UNIT/ML ~~LOC~~ SOLN
10.0000 [IU] | Freq: Every day | SUBCUTANEOUS | Status: DC
  Administered 2015-12-12: 10 [IU] via SUBCUTANEOUS
  Filled 2015-12-12 (×3): qty 0.1

## 2015-12-12 NOTE — Progress Notes (Signed)
PROGRESS NOTE    Rachel Chapman  ZOX:[id number] DOB: March 23, 1940 DOA: 12/11/2015 PCP: [name], NP   Brief Narrative:   75 y.o. female with medical history significant of SCLC s/p radiation and chemo, COPD, DM type 2, HTN, HLD, hypothyroidism, anemia, and anxiety; who presents with worsening shortness of breath.   Assessment & Plan:   Principal Problem:   Acute respiratory failure with hypoxia and hypercapnia (HCC) - continue supplemental oxygen - initially was on bipap has no required it any more per my discussion with nursing.  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 States her breathing condition is better than yesterday.  Objective: Vitals:   12/12/15 0700 12/12/15 0800 12/12/15 0900 12/12/15 0911  BP:  (!) 126/59    Pulse:    (!) 111  Resp: (!) 23 (!) 24 (!) 26 (!) 21  Temp:      TempSrc:      SpO2: 97% 92% 90% 90%  Weight:      Height:        Intake/Output Summary (Last 24 hours) at 12/12/15 0943 Last data filed at 12/12/15 0645  Gross per 24 hour  Intake              550 ml  Output             1200 ml  Net             -650 ml   Filed Weights   12/11/15 0347  Weight: 67.2 kg (148 lb 2.4 oz)    Examination:  General exam: Appears calm and comfortable, in nad. Respiratory system: rhales, mild  expiratory wheezes, equal chest rise. Cardiovascular system: S1 & S2 heard, RRR. No JVD, murmurs, rubs, gallops or clicks.  Gastrointestinal system: Abdomen is nondistended, soft and nontender. No organomegaly or masses felt. Normal bowel sounds heard. Central nervous system: Alert and oriented. No focal neurological deficits. Extremities: Symmetric 5 x 5 power. Skin: No rashes, lesions or ulcers, on limited exam. Psychiatry: Judgement and insight appear normal. Mood & affect appropriate.   Data Reviewed: I have personally reviewed following labs and imaging studies  CBC:  Recent Labs Lab 12/11/15 0137 12/11/15 0415  WBC 10.6* 10.8*  NEUTROABS 9.2*  --   HGB 10.0* 9.6*  HCT 30.9* 29.9*  MCV 101.6* 101.4*  PLT 157 811   Basic Metabolic Panel:  Recent Labs Lab 12/11/15 0137 12/11/15 0415 12/11/15 0704  NA 134* 134* 132*  K 4.3 4.5 4.6  CL 100* 100* 100*  CO2 '27 26 25  '$ GLUCOSE 391* 450* 477*  BUN 28* 29* 26*  CREATININE 1.37* 1.36* 1.31*  CALCIUM 8.5* 8.5* 7.9*   GFR: Estimated Creatinine Clearance: 32.3 mL/min (by C-G formula based on SCr of 1.31 mg/dL (H)). Liver Function Tests:  Recent Labs Lab 12/11/15 0137  AST 17  ALT 16  ALKPHOS 81  BILITOT 0.4  PROT 6.4*  ALBUMIN 3.4*   No results for input(s): LIPASE, AMYLASE in the last 168 hours. No results for input(s): AMMONIA in the last 168 hours. Coagulation Profile:  Recent Labs Lab 12/11/15 0415  INR 0.97   Cardiac Enzymes: No results for input(s): CKTOTAL, CKMB, CKMBINDEX, TROPONINI in the last 168 hours. BNP (last 3 results) No results for input(s): PROBNP in the last 8760 hours. HbA1C: No results for input(s): HGBA1C in the last 72 hours. CBG:  Recent Labs Lab 12/11/15 1646 12/11/15 1935 12/11/15 2336 12/12/15 0356 12/12/15 0725  GLUCAP 319* 297* 271* 277* 374*   Lipid Profile: No results for input(s): CHOL, HDL, LDLCALC, TRIG, CHOLHDL, LDLDIRECT in the last 72 hours. Thyroid Function  Tests: No results for input(s): TSH, T4TOTAL, FREET4, T3FREE, THYROIDAB in the last 72 hours. Anemia Panel: No results for input(s): VITAMINB12, FOLATE, FERRITIN, TIBC, IRON, RETICCTPCT in the last 72 hours. Sepsis Labs:  Recent Labs Lab 12/11/15 0152 12/11/15 0415 12/11/15 0704  PROCALCITON  --  <0.10  --   LATICACIDVEN 1.96* 2.3* 1.9    Recent Results (from the past 240 hour(s))  MRSA PCR Screening     Status: None   Collection Time: 12/11/15  3:49 AM  Result Value Ref Range Status   MRSA by PCR NEGATIVE NEGATIVE Final    Comment:        The GeneXpert MRSA Assay (FDA approved for NASAL specimens only), is one component of a comprehensive MRSA colonization surveillance program. It is not intended to diagnose MRSA infection nor to guide or monitor treatment for MRSA infections.   Body fluid culture     Status: None (Preliminary result)   Collection Time: 12/11/15 10:49 AM  Result Value Ref Range Status   Specimen Description PLEURAL RIGHT  Final   Special Requests NONE  Final   Gram Stain   Final    FEW WBC PRESENT,BOTH PMN AND MONONUCLEAR NO ORGANISMS SEEN    Culture   Final    NO GROWTH < 24 HOURS Performed at [facility]    Report Status PENDING  Incomplete         Radiology Studies: Dg Chest Port 1 View  Result Date: 12/11/2015 CLINICAL DATA:  75 year old female with a history of right-sided small cell carcinoma. Status post right-sided thoracentesis EXAM: PORTABLE CHEST 1 VIEW COMPARISON:  PET-CT 10/11/2015, chest x-ray 12/11/2015 1:16 a.m. FINDINGS: Cardiomediastinal silhouette unchanged. Dense opacity persists in the right hilar region. Calcifications of the aortic arch. Since the prior there is improved aeration on the right with improved visualization the right hemidiaphragm. No visualized pneumothorax. Trace thickening of the minor fissure persists. No evidence of left-sided pleural effusion. Unchanged position of right IJ port catheter. Right  humeral fracture again noted, better seen on plain film of September 2017. IMPRESSION: Status post right-sided thoracentesis, with improved aeration and no visualized pneumothorax. Hilar opacity in this patient with known small cell carcinoma, may represent a combination of treatment effect, adenopathy, residual pleural fluid, atelectasis/consolidation, or parenchymal involvement. Unchanged right IJ port catheter. Re- demonstration of known right humerus fracture. Signed, Dulcy Fanny. Earleen Newport, DO [facility] Electronically Signed   By: Corrie Mckusick D.O.   On: 12/11/2015 11:04   Dg Chest Port 1 View  Result Date: 12/11/2015 CLINICAL DATA:  Acute onset of shortness of breath. Recently diagnosed with bronchitis. Initial encounter. EXAM: PORTABLE CHEST 1 VIEW COMPARISON:  PET/CT performed 10/11/2015 FINDINGS: A  moderate right-sided pleural effusion is noted, with right-sided airspace opacity. This may reflect pneumonia. Underlying recurrent malignancy is a concern. The left lung appears relatively clear.  No pneumothorax is seen. The cardiomediastinal silhouette is enlarged. Right perihilar postradiation change is again noted. A right-sided chest port is noted ending about the mid SVC. No acute osseous abnormalities are seen. IMPRESSION: Moderate right-sided pleural effusion, with right-sided airspace opacity. This may reflect pneumonia. Underlying recurrent malignancy is a concern. Diagnostic thoracentesis could be considered for further evaluation, or PET/CT could be considered 3-4 weeks after completion of treatment for pneumonia. Electronically Signed   By: Garald Balding M.D.   On: 12/11/2015 01:35   US Thoracentesis Asp Pleural Space W/img Guide  Result Date: 12/11/2015 INDICATION: History of treated small cell lung cancer and recent diagnosis of bronchitis. Now admitted with dyspnea and a moderate right pleural effusion. Request is made for  diagnostic and therapeutic thoracentesis. EXAM: ULTRASOUND GUIDED DIAGNOSTIC AND THERAPEUTIC THORACENTESIS MEDICATIONS: 1% lidocaine COMPLICATIONS: None immediate. PROCEDURE: An ultrasound guided thoracentesis was thoroughly discussed with the patient and questions answered. The benefits, risks, alternatives and complications were also discussed. The patient understands and wishes to proceed with the procedure. Written consent was obtained. Ultrasound was performed to localize and mark an adequate pocket of fluid in the right chest. The area was then prepped and draped in the normal sterile fashion. 1% Lidocaine was used for local anesthesia. Under ultrasound guidance a Safe-T-Centesis catheter was introduced. Thoracentesis was performed. The catheter was removed and a dressing applied. FINDINGS: A total of approximately 1 L of clear yellow fluid was removed. Samples were sent to the laboratory as requested by the clinical team. IMPRESSION: Successful ultrasound guided right thoracentesis yielding 1 L of pleural fluid. Read by: Saverio Danker, PA-C Electronically Signed   By: Jerilynn Mages.  Shick M.D.   On: 12/11/2015 10:48        Scheduled Meds: . insulin aspart  0-15 Units Subcutaneous TID WC  . insulin aspart  0-5 Units Subcutaneous QHS  . insulin detemir  10 Units Subcutaneous Daily  . ipratropium-albuterol  3 mL Nebulization TID  . methylPREDNISolone (SOLU-MEDROL) injection  60 mg Intravenous Q8H  . piperacillin-tazobactam (ZOSYN)  IV  3.375 g Intravenous Q8H  . sodium chloride flush  10-40 mL Intracatheter Q12H  . vancomycin  1,000 mg Intravenous Q0600   Continuous Infusions:    LOS: 1 day    Time spent: > 35 minutes  Velvet Bathe, MD [facility] Pager [phone]  If 7PM-7AM, please contact night-coverage [email or web address] Password [email or web address] 12/12/2015, 9:43 AM

## 2015-12-12 NOTE — Progress Notes (Signed)
NUTRITION NOTE  Spoke with RN during rounds this AM. She reports that pt has been eating very well and consumed 2 breakfasts this AM. RN requests Ensure order be PRN. CBGs this AM: 277 and 374 mg/dL and orders in place for sliding scale Novolog and 10 units Levemir/day.    RD will continue to follow per protocol.   Jarome Matin, MS, RD, LDN Inpatient Clinical Dietitian Pager # [phone] After hours/weekend pager # [phone]

## 2015-12-13 ENCOUNTER — Inpatient Hospital Stay (HOSPITAL_COMMUNITY): Payer: Medicare Other

## 2015-12-13 DIAGNOSIS — J90 Pleural effusion, not elsewhere classified: Secondary | ICD-10-CM

## 2015-12-13 DIAGNOSIS — J9602 Acute respiratory failure with hypercapnia: Secondary | ICD-10-CM

## 2015-12-13 DIAGNOSIS — J9601 Acute respiratory failure with hypoxia: Principal | ICD-10-CM

## 2015-12-13 DIAGNOSIS — E034 Atrophy of thyroid (acquired): Secondary | ICD-10-CM

## 2015-12-13 DIAGNOSIS — E1121 Type 2 diabetes mellitus with diabetic nephropathy: Secondary | ICD-10-CM

## 2015-12-13 DIAGNOSIS — C3491 Malignant neoplasm of unspecified part of right bronchus or lung: Secondary | ICD-10-CM

## 2015-12-13 DIAGNOSIS — N183 Chronic kidney disease, stage 3 (moderate): Secondary | ICD-10-CM

## 2015-12-13 DIAGNOSIS — R6510 Systemic inflammatory response syndrome (SIRS) of non-infectious origin without acute organ dysfunction: Secondary | ICD-10-CM

## 2015-12-13 LAB — GLUCOSE, CAPILLARY
GLUCOSE-CAPILLARY: 301 mg/dL — AB (ref 65–99)
GLUCOSE-CAPILLARY: 423 mg/dL — AB (ref 65–99)
Glucose-Capillary: 322 mg/dL — ABNORMAL HIGH (ref 65–99)
Glucose-Capillary: 305 mg/dL — ABNORMAL HIGH (ref 65–99)

## 2015-12-13 LAB — ECHOCARDIOGRAM COMPLETE
HEIGHTINCHES: 62 in
Weight: 2370.39 oz

## 2015-12-13 MED ORDER — INSULIN ASPART 100 UNIT/ML ~~LOC~~ SOLN
18.0000 [IU] | Freq: Once | SUBCUTANEOUS | Status: AC
  Administered 2015-12-13: 18 [IU] via SUBCUTANEOUS

## 2015-12-13 MED ORDER — IOPAMIDOL (ISOVUE-300) INJECTION 61%
75.0000 mL | Freq: Once | INTRAVENOUS | Status: AC | PRN
  Administered 2015-12-13: 75 mL via INTRAVENOUS

## 2015-12-13 MED ORDER — INSULIN DETEMIR 100 UNIT/ML ~~LOC~~ SOLN
10.0000 [IU] | Freq: Once | SUBCUTANEOUS | Status: AC
  Administered 2015-12-13: 10 [IU] via SUBCUTANEOUS
  Filled 2015-12-13: qty 0.1

## 2015-12-13 MED ORDER — ENOXAPARIN SODIUM 40 MG/0.4ML ~~LOC~~ SOLN
40.0000 mg | SUBCUTANEOUS | Status: DC
  Administered 2015-12-13: 40 mg via SUBCUTANEOUS
  Filled 2015-12-13: qty 0.4

## 2015-12-13 MED ORDER — DM-GUAIFENESIN ER 30-600 MG PO TB12
1.0000 | ORAL_TABLET | Freq: Two times a day (BID) | ORAL | Status: DC
  Administered 2015-12-13 – 2015-12-16 (×7): 1 via ORAL
  Filled 2015-12-13 (×7): qty 1

## 2015-12-13 MED ORDER — PREDNISONE 20 MG PO TABS
60.0000 mg | ORAL_TABLET | Freq: Every day | ORAL | Status: DC
Start: 2015-12-14 — End: 2015-12-15
  Administered 2015-12-14 – 2015-12-15 (×2): 60 mg via ORAL
  Filled 2015-12-13 (×2): qty 3

## 2015-12-13 NOTE — Progress Notes (Signed)
Pharmacy Antibiotic Note  Rachel Chapman is a 75 y.o. female with hx SCLC and COPD admitted on 12/11/2015 with post obstructive pna and AECOPD.  Pharmacy assisting with zosyn dosing.  Pt with CKD-III, SCr appears at baseline.   Plan: Zosyn 3.375g IV q8h (4 hour infusion).  Monitor renal function, cultures, clinical course Check SCr in AM  Height: '5\' 2"'$  (157.5 cm) Weight: 148 lb 2.4 oz (67.2 kg) IBW/kg (Calculated) : 50.1  Temp (24hrs), Avg:98.1 F (36.7 C), Min:98 F (36.7 C), Max:98.1 F (36.7 C)   Recent Labs Lab 12/11/15 0137 12/11/15 0152 12/11/15 0415 12/11/15 0704  WBC 10.6*  --  10.8*  --   CREATININE 1.37*  --  1.36* 1.31*  LATICACIDVEN  --  1.96* 2.3* 1.9    Estimated Creatinine Clearance: 32.3 mL/min (by C-G formula based on SCr of 1.31 mg/dL (H)).    No Known Allergies  Antimicrobials this admission: 10/30 zosyn >>  10/30 vancomycin >> 10/31  Dose adjustments this admission:   Microbiology results:  10/30 BCx: NGTD 10/30 pleural fluid: No organisms seen, no growth < 24h 10/30 MRSA PCR: neg Thank you for allowing pharmacy to be a part of this patient's care.  Ralene Bathe, PharmD, BCPS 12/13/2015, 10:29 AM  Pager: [phone]

## 2015-12-13 NOTE — Progress Notes (Signed)
PROGRESS NOTE    Rachel Chapman  OIZ:[id number] DOB: [birth year] DOA: 12/11/2015  PCP: [name], NP   Brief Narrative:  75 y.o.femalewith medical history significant of SCLC s/p radiation and chemo,COPD, DM type 2,HTN, HLD, hypothyroidism, anemia, and anxietywho presents with worsening shortness of breath.   Subjective: Coughing but cannot cough up sputum. She can hear it in her chest. Short of breath when walking in the room. Her son tells me in private that his mother is barely eating and drinking and barely active. He feels she is severely depressed and despite being started on Celexa about 2 months ago, symptoms are not improving.   Assessment & Plan:   Principal Problem: Acute respiratory failure with hypoxia and hypercapnia  Component Value Date/Time   PHART 7.285 (L) 12/11/2015 0150   PCO2ART 55.2 (H) 12/11/2015 0150   PO2ART 160 (H) 12/11/2015 0150   HCO3 25.6 12/11/2015 0150   ACIDBASEDEF 1.3 12/11/2015 0150   O2SAT 99.1 12/11/2015 0150   (A)   Pleural effusion on right- ? HCAP   - s/p thoracentesis on 10/31- fluid is transdative per lights criteria using glucose and reveals 541 wbc, mostly lymphs and monocytes- no malignant cells- obtain ECHO - no underlying pneumonia seen after effusion drained- f/u CT- if no infiltrates, will d/c antibiotics  (B) COPD? - no formal diagnosis - quit smoking a litter over a year ago - admits to mucous which she can here in her chest but is not able to cough up- cant walk around the room without getting short of breath- her family states she barely ambulates at home which they suspect is due to depression.  - wean steroids-  add mucinex and flutter valve- cont nebs    SIRS (systemic inflammatory response syndrome) - tachycardic and tachypneic      Diabetes type 2 - she states her sugars are usually in the 200s- takes Januvia - currently sugars elevated due to steroids which I am weaning - have added Levemir and  Novolog- adjusting doses to control sugar    Hypothyroidism - synthroid    Small cell carcinoma of right lung - 12/16- treated with chemo, radiation to lungs and prophylactic radiation to head - recent PET 10/11/15 showing new mediastinal lymphadenopathy consistent with recurrent carcinoma- she has not had a biopsy yet as mentioned by Dr Irene Limbo in his last note because she did not want to go through with it because she fell and fractured her arm - will obtain CT chest today- patient does want treatment if she has recurrent cancer- have discussed with Dr Irene Limbo  Severe anxiety and depression - celexa, Lorazepam- consult psych as she has continued depression-     Chronic kidney disease, stage III (moderate) - stable  DVT prophylaxis: Lovenox Code Status: Full code Family Communication: son Disposition Plan: home when stable Consultants:    Procedures:    Antimicrobials:  Anti-infectives    Start     Dose/Rate Route Frequency Ordered Stop   12/11/15 1200  piperacillin-tazobactam (ZOSYN) IVPB 3.375 g     3.375 g 12.5 mL/hr over 240 Minutes Intravenous Every 8 hours 12/11/15 0332     12/11/15 0315  vancomycin (VANCOCIN) IVPB 1000 mg/200 mL premix  Status:  Discontinued     1,000 mg 200 mL/hr over 60 Minutes Intravenous Daily 12/11/15 0301 12/12/15 1330   12/11/15 0300  piperacillin-tazobactam (ZOSYN) IVPB 3.375 g     3.375 g 100 mL/hr over 30 Minutes Intravenous NOW 12/11/15 0300 12/11/15 0446  Objective: Vitals:   12/12/15 1515 12/12/15 2010 12/12/15 2020 12/13/15 0908  BP: 105/87 123/67    Pulse: (!) 106 (!) 110 (!) 110   Resp: 18 17    Temp: 98 F (36.7 C) 98.1 F (36.7 C)    TempSrc: Oral Oral    SpO2: 94% (!) 88% 93% 97%  Weight:      Height:        Intake/Output Summary (Last 24 hours) at 12/13/15 1307 Last data filed at 12/13/15 0837  Gross per 24 hour  Intake              820 ml  Output              500 ml  Net              320 ml   Filed Weights    12/11/15 0347  Weight: 67.2 kg (148 lb 2.4 oz)    Examination: General exam: Appears comfortable  HEENT: PERRLA, oral mucosa moist, no sclera icterus or thrush Respiratory system: Clear to auscultation. Respiratory effort normal. Cardiovascular system: S1 & S2 heard, RRR.  No murmurs  Gastrointestinal system: Abdomen soft, non-tender, nondistended. Normal bowel sound. No organomegaly Central nervous system: Alert and oriented. No focal neurological deficits. Extremities: No cyanosis, clubbing or edema Skin: No rashes or ulcers Psychiatry:  Mood & affect appropriate.     Data Reviewed: I have personally reviewed following labs and imaging studies  CBC:  Recent Labs Lab 12/11/15 0137 12/11/15 0415  WBC 10.6* 10.8*  NEUTROABS 9.2*  --   HGB 10.0* 9.6*  HCT 30.9* 29.9*  MCV 101.6* 101.4*  PLT 157 035   Basic Metabolic Panel:  Recent Labs Lab 12/11/15 0137 12/11/15 0415 12/11/15 0704  NA 134* 134* 132*  K 4.3 4.5 4.6  CL 100* 100* 100*  CO2 '27 26 25  '$ GLUCOSE 391* 450* 477*  BUN 28* 29* 26*  CREATININE 1.37* 1.36* 1.31*  CALCIUM 8.5* 8.5* 7.9*   GFR: Estimated Creatinine Clearance: 32.3 mL/min (by C-G formula based on SCr of 1.31 mg/dL (H)). Liver Function Tests:  Recent Labs Lab 12/11/15 0137  AST 17  ALT 16  ALKPHOS 81  BILITOT 0.4  PROT 6.4*  ALBUMIN 3.4*   No results for input(s): LIPASE, AMYLASE in the last 168 hours. No results for input(s): AMMONIA in the last 168 hours. Coagulation Profile:  Recent Labs Lab 12/11/15 0415  INR 0.97   Cardiac Enzymes: No results for input(s): CKTOTAL, CKMB, CKMBINDEX, TROPONINI in the last 168 hours. BNP (last 3 results) No results for input(s): PROBNP in the last 8760 hours. HbA1C: No results for input(s): HGBA1C in the last 72 hours. CBG:  Recent Labs Lab 12/12/15 1144 12/12/15 1757 12/12/15 2204 12/13/15 0750 12/13/15 1223  GLUCAP 398* 313* 369* 301* 423*   Lipid Profile: No results for  input(s): CHOL, HDL, LDLCALC, TRIG, CHOLHDL, LDLDIRECT in the last 72 hours. Thyroid Function Tests: No results for input(s): TSH, T4TOTAL, FREET4, T3FREE, THYROIDAB in the last 72 hours. Anemia Panel: No results for input(s): VITAMINB12, FOLATE, FERRITIN, TIBC, IRON, RETICCTPCT in the last 72 hours. Urine analysis:    Component Value Date/Time   COLORURINE YELLOW 11/25/2014 [place] 11/25/2014 1526   LABSPEC 1.006 11/25/2014 1526   PHURINE 6.5 11/25/2014 1526   GLUCOSEU NEGATIVE 11/25/2014 1526   HGBUR NEGATIVE 11/25/2014 [place] 11/25/2014 [place] 11/25/2014 [place] 11/25/2014  1526   UROBILINOGEN 0.2 11/25/2014 1526   NITRITE NEGATIVE 11/25/2014 1526   LEUKOCYTESUR TRACE (A) 11/25/2014 1526   Sepsis Labs: '@LABRCNTIP'$ (procalcitonin:4,lacticidven:4) ) Recent Results (from the past 240 hour(s))  MRSA PCR Screening     Status: None   Collection Time: 12/11/15  3:49 AM  Result Value Ref Range Status   MRSA by PCR NEGATIVE NEGATIVE Final    Comment:        The GeneXpert MRSA Assay (FDA approved for NASAL specimens only), is one component of a comprehensive MRSA colonization surveillance program. It is not intended to diagnose MRSA infection nor to guide or monitor treatment for MRSA infections.   Culture, blood (x 2)     Status: None (Preliminary result)   Collection Time: 12/11/15  4:15 AM  Result Value Ref Range Status   Specimen Description BLOOD RIGHT FOREARM  Final   Special Requests IN PEDIATRIC BOTTLE 3CC  Final   Culture   Final    NO GROWTH 1 DAY Performed at [facility]    Report Status PENDING  Incomplete  Culture, blood (x 2)     Status: None (Preliminary result)   Collection Time: 12/11/15  7:04 AM  Result Value Ref Range Status   Specimen Description BLOOD RIGHT ARM  Final   Special Requests BOTTLES DRAWN AEROBIC AND ANAEROBIC 5CC  Final   Culture   Final    NO GROWTH 1  DAY Performed at [facility]    Report Status PENDING  Incomplete  Body fluid culture     Status: None (Preliminary result)   Collection Time: 12/11/15 10:49 AM  Result Value Ref Range Status   Specimen Description PLEURAL RIGHT  Final   Special Requests NONE  Final   Gram Stain   Final    FEW WBC PRESENT,BOTH PMN AND MONONUCLEAR NO ORGANISMS SEEN    Culture   Final    NO GROWTH 2 DAYS Performed at [facility]    Report Status PENDING  Incomplete         Radiology Studies: No results found.    Scheduled Meds: . citalopram  20 mg Oral Daily  . dextromethorphan-guaiFENesin  1 tablet Oral BID  . insulin aspart  0-15 Units Subcutaneous TID WC  . insulin aspart  0-5 Units Subcutaneous QHS  . insulin detemir  10 Units Subcutaneous Daily  . ipratropium-albuterol  3 mL Nebulization TID  . levothyroxine  112 mcg Oral QAC breakfast  . mirtazapine  15 mg Oral QHS  . piperacillin-tazobactam (ZOSYN)  IV  3.375 g Intravenous Q8H  . [START ON 12/14/2015] predniSONE  60 mg Oral Q breakfast  . sodium chloride flush  10-40 mL Intracatheter Q12H   Continuous Infusions:    LOS: 2 days    Time spent in minutes: 53    Shateka Petrea, MD [facility] Pager: [email or web address] Password [email or web address] 12/13/2015, 1:07 PM

## 2015-12-13 NOTE — Progress Notes (Signed)
Inpatient Diabetes Program Recommendations  AACE/ADA: New Consensus Statement on Inpatient Glycemic Control (2015)  Target Ranges:  Prepandial:   less than 140 mg/dL      Peak postprandial:   less than 180 mg/dL (1-2 hours)      Critically ill patients:  140 - 180 mg/dL   Lab Results  Component Value Date   GLUCAP 301 (H) 12/13/2015   HGBA1C 6.0 (H) 10/31/2015    Review of Glycemic Control  Blood sugars continue in 300s for past 2 days. Needs meal coverage insulin with high-dose steroids.  Inpatient Diabetes Program Recommendations:    Novolog 3 units tidwc  Increase Lantus to 12 units QD.  Will continue to follow.   Thank you. Lorenda Peck, RD, LDN, CDE Inpatient Diabetes Coordinator [phone]

## 2015-12-13 NOTE — Progress Notes (Signed)
Patient ambulated in hall from her room to the front nursing station and patient oxygen saturation was 92% on 2L via [place].

## 2015-12-13 NOTE — Progress Notes (Signed)
  Echocardiogram 2D Echocardiogram has been performed.  Rachel Chapman M 12/13/2015, 2:48 PM

## 2015-12-14 ENCOUNTER — Encounter (HOSPITAL_COMMUNITY): Payer: Self-pay | Admitting: General Surgery

## 2015-12-14 DIAGNOSIS — F331 Major depressive disorder, recurrent, moderate: Secondary | ICD-10-CM | POA: Diagnosis present

## 2015-12-14 DIAGNOSIS — Z85118 Personal history of other malignant neoplasm of bronchus and lung: Secondary | ICD-10-CM

## 2015-12-14 DIAGNOSIS — Z87891 Personal history of nicotine dependence: Secondary | ICD-10-CM

## 2015-12-14 DIAGNOSIS — R918 Other nonspecific abnormal finding of lung field: Secondary | ICD-10-CM

## 2015-12-14 DIAGNOSIS — Z79899 Other long term (current) drug therapy: Secondary | ICD-10-CM

## 2015-12-14 DIAGNOSIS — Z801 Family history of malignant neoplasm of trachea, bronchus and lung: Secondary | ICD-10-CM

## 2015-12-14 DIAGNOSIS — Z8489 Family history of other specified conditions: Secondary | ICD-10-CM

## 2015-12-14 DIAGNOSIS — R5383 Other fatigue: Secondary | ICD-10-CM

## 2015-12-14 DIAGNOSIS — Z800 Family history of malignant neoplasm of digestive organs: Secondary | ICD-10-CM

## 2015-12-14 DIAGNOSIS — R0602 Shortness of breath: Secondary | ICD-10-CM

## 2015-12-14 DIAGNOSIS — J90 Pleural effusion, not elsewhere classified: Secondary | ICD-10-CM

## 2015-12-14 DIAGNOSIS — G4700 Insomnia, unspecified: Secondary | ICD-10-CM | POA: Diagnosis present

## 2015-12-14 LAB — CREATININE, SERUM
Creatinine, Ser: 1.4 mg/dL — ABNORMAL HIGH (ref 0.44–1.00)
GFR calc Af Amer: 41 mL/min — ABNORMAL LOW (ref >60)
GFR, EST NON AFRICAN AMERICAN: 35 mL/min — AB (ref >60)

## 2015-12-14 LAB — GLUCOSE, CAPILLARY
Glucose-Capillary: 105 mg/dL — ABNORMAL HIGH (ref 65–99)
Glucose-Capillary: 220 mg/dL — ABNORMAL HIGH (ref 65–99)
Glucose-Capillary: 407 mg/dL — ABNORMAL HIGH (ref 65–99)
Glucose-Capillary: 358 mg/dL — ABNORMAL HIGH (ref 65–99)

## 2015-12-14 LAB — BODY FLUID CULTURE: CULTURE: NO GROWTH

## 2015-12-14 MED ORDER — INSULIN ASPART 100 UNIT/ML ~~LOC~~ SOLN
3.0000 [IU] | Freq: Three times a day (TID) | SUBCUTANEOUS | Status: DC
  Administered 2015-12-14 – 2015-12-16 (×4): 3 [IU] via SUBCUTANEOUS

## 2015-12-14 MED ORDER — CITALOPRAM HYDROBROMIDE 20 MG PO TABS
10.0000 mg | ORAL_TABLET | Freq: Every day | ORAL | Status: DC
  Administered 2015-12-15 – 2015-12-16 (×2): 10 mg via ORAL
  Filled 2015-12-14 (×2): qty 1

## 2015-12-14 MED ORDER — LORAZEPAM 0.5 MG PO TABS
0.5000 mg | ORAL_TABLET | Freq: Once | ORAL | Status: AC
  Administered 2015-12-14: 0.5 mg via ORAL
  Filled 2015-12-14: qty 1

## 2015-12-14 MED ORDER — INSULIN DETEMIR 100 UNIT/ML ~~LOC~~ SOLN
20.0000 [IU] | Freq: Every day | SUBCUTANEOUS | Status: DC
  Filled 2015-12-14: qty 0.2

## 2015-12-14 MED ORDER — ENOXAPARIN SODIUM 40 MG/0.4ML ~~LOC~~ SOLN
40.0000 mg | SUBCUTANEOUS | Status: DC
  Administered 2015-12-15: 40 mg via SUBCUTANEOUS
  Filled 2015-12-14: qty 0.4

## 2015-12-14 MED ORDER — HYDROXYZINE HCL 25 MG PO TABS: 25.0000 mg | ORAL_TABLET | Freq: Three times a day (TID) | ORAL | Status: DC | PRN

## 2015-12-14 MED ORDER — INSULIN DETEMIR 100 UNIT/ML ~~LOC~~ SOLN
10.0000 [IU] | Freq: Every day | SUBCUTANEOUS | Status: DC
  Administered 2015-12-14 – 2015-12-15 (×2): 10 [IU] via SUBCUTANEOUS
  Filled 2015-12-14 (×3): qty 0.1

## 2015-12-14 NOTE — Progress Notes (Signed)
SATURATION QUALIFICATIONS: (This note is used to comply with regulatory documentation for home oxygen)  Patient Saturations on Room Air at Rest =   Patient Saturations on Room Air while Ambulating = 82%  Patient Saturations on 2 Liters of oxygen while Ambulating = 92%  Please briefly explain why patient needs home oxygen:

## 2015-12-14 NOTE — H&P (Signed)
Chief Complaint: recurrent lung cancer  Referring Physician:Dr. Debbe Odea  Supervising Physician: Daryll Brod  Patient Status: [facility] - In-pt  HPI: Rachel Chapman is an 75 y.o. female with a history of lung cancer who has undergone treatment and is followed by Dr. Irene Limbo.  She has been hospitalized for respiratory failure. She had a right pleural effusion and underwent a thoracentesis earlier this week.  Her cytology was negative.  She had a repeat CT scan of her chest which shows recurrence of her disease with marked progression of mediastinal and supraclavicular nodal mets.  She also has a new large right perihilar lung mass which is new as well.  We have been asked to biopsy one of these areas to confirm recurrence.  Past Medical History:  Past Medical History:  Diagnosis Date  . Anemia   . Anxiety   . Cancer ([place])   . Diabetes ([place])    Type II  . Dyslipidemia   . History of blood transfusion   . Hypertension   . Hypothyroidism   . Lung cancer ([place]) 8/216   hilar/mediastinum lung scca  . Pneumonia 2007  . Shortness of breath dyspnea    with exertion  . Smoker    1.5 packs per day for 60 years.  Quit one month ago    Past Surgical History:  Past Surgical History:  Procedure Laterality Date  . CESAREAN SECTION    . EYE SURGERY Bilateral    Cataract    Family History:  Family History  Problem Relation Age of Onset  . Cancer Mother     lung non smoker  . Diabetes Mother   . Cancer Sister     colon  . Diabetes Sister   . Clotting disorder Neg Hx     Social History:  reports that she quit smoking about 15 months ago. She has a 90.00 pack-year smoking history. She has never used smokeless tobacco. She reports that she does not drink alcohol or use drugs.  Allergies: No Known Allergies  Medications: Medications reviewed in Epic  Please HPI for pertinent positives, otherwise complete 10 system ROS negative.  Mallampati Score: MD Evaluation Airway:  WNL Heart: WNL Abdomen: WNL Chest/ Lungs: WNL ASA  Classification: 3 Mallampati/Airway Score: Two  Physical Exam: BP (!) 156/79 (BP Location: Left Arm)   Pulse 99   Temp 97.9 F (36.6 C) (Oral)   Resp 18   Ht 5' 2"  (1.575 m)   Wt 148 lb 2.4 oz (67.2 kg)   SpO2 97%   BMI 27.10 kg/m  Body mass index is 27.1 kg/m. General: pleasant, elderly white female who is laying in bed in NAD HEENT: head is normocephalic, atraumatic.  Sclera are noninjected.  PERRL.  Ears and nose without any masses or lesions.  Mouth is pink and moist Heart: regular, rate, and rhythm.  Normal s1,s2. No obvious murmurs, gallops, or rubs noted.  Palpable radial and pedal pulses bilaterally Lungs: few rhonchi noted.  Respiratory effort nonlabored Abd: soft, NT, ND, +BS, no masses, hernias, or organomegaly Psych: A&Ox3 with an appropriate affect.   Labs: Results for orders placed or performed during the hospital encounter of 12/11/15 (from the past 48 hour(s))  Glucose, capillary     Status: Abnormal   Collection Time: 12/12/15  5:57 PM  Result Value Ref Range   Glucose-Capillary 313 (H) 65 - 99 mg/dL   Comment 1 Notify RN    Comment 2 Document in Chart   Glucose, capillary  Status: Abnormal   Collection Time: 12/12/15 10:04 PM  Result Value Ref Range   Glucose-Capillary 369 (H) 65 - 99 mg/dL   Comment 1 Notify RN   Glucose, capillary     Status: Abnormal   Collection Time: 12/13/15  7:50 AM  Result Value Ref Range   Glucose-Capillary 301 (H) 65 - 99 mg/dL  Glucose, capillary     Status: Abnormal   Collection Time: 12/13/15 12:23 PM  Result Value Ref Range   Glucose-Capillary 423 (H) 65 - 99 mg/dL  Glucose, capillary     Status: Abnormal   Collection Time: 12/13/15  4:50 PM  Result Value Ref Range   Glucose-Capillary 322 (H) 65 - 99 mg/dL  Glucose, capillary     Status: Abnormal   Collection Time: 12/13/15  9:03 PM  Result Value Ref Range   Glucose-Capillary 305 (H) 65 - 99 mg/dL   Comment 1  Notify RN   Creatinine, serum     Status: Abnormal   Collection Time: 12/14/15  4:35 AM  Result Value Ref Range   Creatinine, Ser 1.40 (H) 0.44 - 1.00 mg/dL   GFR calc non Af Amer 35 (L) >60 mL/min   GFR calc Af Amer 41 (L) >60 mL/min    Comment: (NOTE) The eGFR has been calculated using the CKD EPI equation. This calculation has not been validated in all clinical situations. eGFR's persistently <60 mL/min signify possible Chronic Kidney Disease.   Glucose, capillary     Status: Abnormal   Collection Time: 12/14/15  7:56 AM  Result Value Ref Range   Glucose-Capillary 105 (H) 65 - 99 mg/dL   Comment 1 Notify RN    Comment 2 Document in Chart   Glucose, capillary     Status: Abnormal   Collection Time: 12/14/15 11:53 AM  Result Value Ref Range   Glucose-Capillary 220 (H) 65 - 99 mg/dL   Comment 1 Notify RN    Comment 2 Document in Chart     Imaging: Ct Chest W Contrast  Result Date: 12/13/2015 CLINICAL DATA:  Lung cancer recurrence. EXAM: CT CHEST WITH CONTRAST TECHNIQUE: Multidetector CT imaging of the chest was performed during intravenous contrast administration. CONTRAST:  106m ISOVUE-300 IOPAMIDOL (ISOVUE-300) INJECTION 61% COMPARISON:  09/14/2015 FINDINGS: Cardiovascular: The heart size is normal. The heart size is normal. There is a new small pericardial effusion. Aortic atherosclerosis noted. Calcification within the LAD coronary artery is identified. Mediastinum/Nodes: The trachea appears patent and is midline. Normal appearance of the esophagus. Progression of mediastinal adenopathy. Index right paratracheal lymph node measures 1.6 cm, image 34 of series 2. Previously 1.0 cm. Index pre-vascular lymph node measures 2.2 cm, image 43 of series 2. Previously 0.9 cm. New bilateral supraclavicular adenopathy. Index right supraclavicular node, image number 12 of series 2. Measures 1.6 cm index left supraclavicular lymph node measures 1.4 cm. Lungs/Pleura: Large central right perihilar  mass is identified and is new from previous exam compatible with local tumor recurrence. The mass measures 9 x 10.9 by 8.2 cm, image 62 of series 2. There is narrowing of the right lower lobe, right middle lobe and right upper lobe airways. There is also significant mass effect upon the branches of the right pulmonary artery. New right upper lobe pulmonary nodule measures 9 mm, image number 49 of series 4. Perifissural nodule within the right middle lobe measures 11 mm, image number 60 of series 4. New from previous exam. There is a moderate to large right pleural effusion which is  new from previous exam. Upper Abdomen: Multiple small stones identified within the gallbladder. The adrenal glands appear normal. No acute findings noted within the upper abdomen. Musculoskeletal: The bones appear diffusely osteopenic. Chronic fracture deformity involving the proximal right humerus noted. IMPRESSION: 1. Examination positive for recurrence of disease. 2. Compared with 09/14/2015 there has been marked progression of mediastinal and bilateral supraclavicular nodal metastases. 3. Large right perihilar lung mass is new from the previous exam. 4. New right pleural effusion with perifissural nodularity suspicious for pleural spread of tumor. 5. Small pericardial effusion is new from previous exam. Cannot rule out malignant pericardial effusion. 6. Gallstones Electronically Signed   By: Kerby Moors M.D.   On: 12/13/2015 16:36    Assessment/Plan 1. Recurrent lung cancer with lymphadenopathy -we will plan to proceed with right supraclavicular lymph node biopsy tomorrow. -recent labs have been reviewed and are ok to proceed -Risks and Benefits discussed with the patient including, but not limited to bleeding, infection, damage to adjacent structures or low yield requiring additional tests. All of the patient's questions were answered, patient is agreeable to proceed. Consent signed and in chart.  Thank you for this  interesting consult.  I greatly enjoyed meeting Rachel Chapman and look forward to participating in their care.  A copy of this report was sent to the requesting provider on this date.  Electronically Signed: Henreitta Cea 12/14/2015, 1:46 PM   I spent a total of 40 Minutes    in face to face in clinical consultation, greater than 50% of which was counseling/coordinating care for lung cancer, supraclavicular lymphadenopathy

## 2015-12-14 NOTE — Progress Notes (Signed)
Pt from home with children and receives home health services from [facility]. She is active for RN/PT/OT/Nursing Aide. Will need resumption orders at discharge. CM will continue to follow. Marney Doctor RN,BSN,NCM [phone]

## 2015-12-14 NOTE — Consult Note (Signed)
[facility] Face-to-Face Psychiatry Consult   Reason for Consult:  Depression/anxiety Referring Physician:  [facility] group Patient Identification: Makalya Nave MRN:  [id number] Principal Diagnosis: Acute respiratory failure with hypoxia and hypercapnia ([place]) Diagnosis:   Patient Active Problem List   Diagnosis Date Noted  . Respiratory failure ([place]) [J96.90] 12/11/2015  . Acute respiratory failure with hypoxia and hypercapnia (HCC) [J96.01, J96.02] 12/11/2015  . Chronic kidney disease, stage III (moderate) [N18.3] 12/11/2015  . SIRS (systemic inflammatory response syndrome) ([place]) [R65.10] 12/11/2015  . Pleural effusion on right [J90] 12/11/2015  . Risk of brain metastases warranting prophylactic brain radiation [C79.31] 03/15/2015  . Anemia due to antineoplastic chemotherapy [D64.81, T45.1X5A] 01/18/2015  . Nausea with vomiting [R11.2]   . Symptomatic anemia [D64.9]   . Protein-calorie malnutrition, severe ([place]) [E43] 12/13/2014  . Hyponatremia [E87.1] 12/13/2014  . Orthostatic hypotension [I95.1] 12/13/2014  . Neoplasm related pain [G89.3] 12/13/2014  . Nausea without vomiting [R11.0] 12/13/2014  . Dehydration [E86.0] 12/12/2014  . Radiation-induced esophagitis [K20.8] 12/12/2014  . Hypotension [I95.9] 12/12/2014  . Antineoplastic chemotherapy induced pancytopenia ([place]) [L93.790, T45.1X5A] 12/12/2014  . Diabetes type 2, controlled ([place]) [E11.9] 11/01/2014  . Hypothyroidism [E03.9] 11/01/2014  . Hyperlipidemia [E78.5] 11/01/2014  . 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 female patient admitted with reports of worsening depression/anxiety coinciding with news that her cancer may have returned. Pt seen and chart reviewed. Pt is alert/oriented x4, calm, cooperative, and appropriate to situation. Pt denies suicidal/homicidal ideation and psychosis and does not appear to be  responding to internal stimuli. Pt does report a worsening of anxiety and depression with the perception that Celexa is not working well for her. Denies outpatient followup and providers at this time.  HPI:  Modified from hospitalist HPI: "75 y.o.femalewith medical history significant of SCLC s/p radiation and chemo,COPD, DM type 2,HTN, HLD, hypothyroidism, anemia, and anxietywho presents with worsening shortness of breath. Per report, family stated that pt is barely eating and drinking and barely active. He feels she is severely depressed and despite being started on Celexa about 2 months ago, symptoms may not be improving."  Today on 12/14/15, seen above for psychiatry evaluation. Pt has been cooperative with nursing staff and has been eating her meals. Will make recommendations as mentioned above.    Past Psychiatric History: MDD, anxiety  Risk to Self: Is patient at risk for suicide?: No Risk to Others:   Prior Inpatient Therapy:   Prior Outpatient Therapy:    Past Medical History:  Past Medical History:  Diagnosis Date  . Anemia   . Anxiety   . Cancer ([place])   . Diabetes ([place])    Type II  . Dyslipidemia   . History of blood transfusion   . Hypertension   . Hypothyroidism   . Lung cancer ([place]) 8/216   hilar/mediastinum lung scca  . Pneumonia 2007  . Shortness of breath dyspnea    with exertion  . Smoker    1.5 packs per day for 60 years.  Quit one month ago    Past Surgical History:  Procedure Laterality Date  . CESAREAN SECTION    . EYE SURGERY Bilateral    Cataract   Family History:  Family History  Problem Relation Age of Onset  . Cancer Mother     lung non smoker  . Diabetes Mother   . Cancer Sister  colon  . Diabetes Sister   . Clotting disorder Neg Hx    Family Psychiatric  History: denies Social History:  History  Alcohol Use No     History  Drug Use No    Social History   Social History  . Marital status: Widowed    Spouse name: N/A   . Number of children: N/A  . Years of education: N/A   Social History Main Topics  . Smoking status: Former Smoker    Packs/day: 1.50    Years: 60.00    Quit date: 09/12/2014  . Smokeless tobacco: Never Used  . Alcohol use No  . Drug use: No  . Sexual activity: No   Other Topics Concern  . None   Social History Narrative   She has moved from [place]   She is retired from working with special education    Was married widowed twice.          Additional Social History:    Allergies:  No Known Allergies  Labs:  Results for orders placed or performed during the hospital encounter of 12/11/15 (from the past 48 hour(s))  Glucose, capillary     Status: Abnormal   Collection Time: 12/12/15 11:44 AM  Result Value Ref Range   Glucose-Capillary 398 (H) 65 - 99 mg/dL  Glucose, capillary     Status: Abnormal   Collection Time: 12/12/15  5:57 PM  Result Value Ref Range   Glucose-Capillary 313 (H) 65 - 99 mg/dL   Comment 1 Notify RN    Comment 2 Document in Chart   Glucose, capillary     Status: Abnormal   Collection Time: 12/12/15 10:04 PM  Result Value Ref Range   Glucose-Capillary 369 (H) 65 - 99 mg/dL   Comment 1 Notify RN   Glucose, capillary     Status: Abnormal   Collection Time: 12/13/15  7:50 AM  Result Value Ref Range   Glucose-Capillary 301 (H) 65 - 99 mg/dL  Glucose, capillary     Status: Abnormal   Collection Time: 12/13/15 12:23 PM  Result Value Ref Range   Glucose-Capillary 423 (H) 65 - 99 mg/dL  Glucose, capillary     Status: Abnormal   Collection Time: 12/13/15  4:50 PM  Result Value Ref Range   Glucose-Capillary 322 (H) 65 - 99 mg/dL  Glucose, capillary     Status: Abnormal   Collection Time: 12/13/15  9:03 PM  Result Value Ref Range   Glucose-Capillary 305 (H) 65 - 99 mg/dL   Comment 1 Notify RN   Creatinine, serum     Status: Abnormal   Collection Time: 12/14/15  4:35 AM  Result Value Ref Range   Creatinine, Ser 1.40 (H) 0.44 - 1.00 mg/dL   GFR calc  non Af Amer 35 (L) >60 mL/min   GFR calc Af Amer 41 (L) >60 mL/min    Comment: (NOTE) The eGFR has been calculated using the CKD EPI equation. This calculation has not been validated in all clinical situations. eGFR's persistently <60 mL/min signify possible Chronic Kidney Disease.   Glucose, capillary     Status: Abnormal   Collection Time: 12/14/15  7:56 AM  Result Value Ref Range   Glucose-Capillary 105 (H) 65 - 99 mg/dL   Comment 1 Notify RN    Comment 2 Document in Chart     Current Facility-Administered Medications  Medication Dose Route Frequency Provider Last Rate Last Dose  . acetaminophen (TYLENOL) tablet 650 mg  650 mg  Oral Q6H PRN Norval Morton, MD       Or  . acetaminophen (TYLENOL) suppository 650 mg  650 mg Rectal Q6H PRN Norval Morton, MD      . albuterol (PROVENTIL) (2.5 MG/3ML) 0.083% nebulizer solution 2.5 mg  2.5 mg Nebulization Q2H PRN Norval Morton, MD   2.5 mg at 12/13/15 0543  . citalopram (CELEXA) tablet 20 mg  20 mg Oral Daily Velvet Bathe, MD   20 mg at 12/14/15 0943  . dextromethorphan-guaiFENesin (MUCINEX DM) 30-600 MG per 12 hr tablet 1 tablet  1 tablet Oral BID Debbe Odea, MD   1 tablet at 12/14/15 0942  . enoxaparin (LOVENOX) injection 40 mg  40 mg Subcutaneous Q24H Debbe Odea, MD   40 mg at 12/13/15 1744  . insulin aspart (novoLOG) injection 0-15 Units  0-15 Units Subcutaneous TID WC Velvet Bathe, MD   11 Units at 12/13/15 1743  . insulin aspart (novoLOG) injection 0-5 Units  0-5 Units Subcutaneous QHS Velvet Bathe, MD   4 Units at 12/13/15 2122  . ipratropium-albuterol (DUONEB) 0.5-2.5 (3) MG/3ML nebulizer solution 3 mL  3 mL Nebulization TID Velvet Bathe, MD   3 mL at 12/14/15 0715  . levothyroxine (SYNTHROID, LEVOTHROID) tablet 112 mcg  112 mcg Oral QAC breakfast Velvet Bathe, MD   112 mcg at 12/14/15 0943  . LORazepam (ATIVAN) injection 0.5 mg  0.5 mg Intravenous Q6H PRN Norval Morton, MD   0.5 mg at 12/11/15 2351  . mirtazapine (REMERON)  tablet 15 mg  15 mg Oral QHS Velvet Bathe, MD   15 mg at 12/13/15 2121  . ondansetron (ZOFRAN) tablet 4 mg  4 mg Oral Q6H PRN Norval Morton, MD       Or  . ondansetron (ZOFRAN) injection 4 mg  4 mg Intravenous Q6H PRN Rondell A Tamala Julian, MD      . predniSONE (DELTASONE) tablet 60 mg  60 mg Oral Q breakfast Debbe Odea, MD   60 mg at 12/14/15 0942  . sodium chloride flush (NS) 0.9 % injection 10-40 mL  10-40 mL Intracatheter Q12H Velvet Bathe, MD   10 mL at 12/12/15 2216  . sodium chloride flush (NS) 0.9 % injection 10-40 mL  10-40 mL Intracatheter PRN Velvet Bathe, MD       Facility-Administered Medications Ordered in Other Encounters  Medication Dose Route Frequency Provider Last Rate Last Dose  . sodium chloride 0.9 % injection 10 mL  10 mL Intracatheter PRN [name], MD   10 mL at 11/11/14 1131  . sodium chloride 0.9 % injection 10 mL  10 mL Intravenous PRN [name], MD   10 mL at 01/18/15 1527    Musculoskeletal: Strength & Muscle Tone: decreased Gait & Station: unsteady Patient leans: N/A  Psychiatric Specialty Exam: Physical Exam  Review of Systems  Psychiatric/Behavioral: Positive for depression. Negative for hallucinations, substance abuse and suicidal ideas. The patient is nervous/anxious and has insomnia.   All other systems reviewed and are negative.   Blood pressure (!) 156/79, pulse 99, temperature 97.9 F (36.6 C), temperature source Oral, resp. rate 18, height 5' 2"  (1.575 m), weight 67.2 kg (148 lb 2.4 oz), SpO2 97 %.Body mass index is 27.1 kg/m.  General Appearance: Casual and Fairly Groomed  Eye Contact:  Good  Speech:  Clear and Coherent and Normal Rate  Volume:  Normal  Mood:  Anxious and Depressed  Affect:  Appropriate, Congruent and Depressed  Thought Process:  Coherent, Goal Directed, Linear and Descriptions of Associations: Intact  Orientation:  Full (Time, Place, and Person)  Thought Content:  Possible cancer with biopsy this week,  anxiety and depression over worsening medical scenarios  Suicidal Thoughts:  No  Homicidal Thoughts:  No  Memory:  Immediate;   Fair Recent;   Fair Remote;   Fair  Judgement:  Fair  Insight:  Fair  Psychomotor Activity:  Normal  Concentration:  Concentration: Fair and Attention Span: Fair  Recall:  [employer] of Knowledge:  Fair  Language:  Fair  Akathisia:  No  Handed:    AIMS (if indicated):     Assets:  Communication Skills Desire for Improvement Resilience Social Support Talents/Skills  ADL's:  Intact  Cognition:  WNL  Sleep:      Treatment Plan Summary: MDD (major depressive disorder), recurrent episode, moderate (HCC) intermittently worsening coinciding with bad news regarding possible return of cancer, managed as below:  Medications: -Decrease Celexa to 63m (to cross titrate downward) -Continue Remeron 148mpo qhs for MDD/insomnia (may titrate up to 3048mutpatient in 2 weeks with concomitant cessation of Celexa 28m74mVistaril 25mg71mtid prn anxiety (use caution and keep lose-dose so as to avoid fall risk)  Disposition: No evidence of imminent risk to self or others at present.   Supportive therapy provided about ongoing stressors. Discussed crisis plan, support from social network, calling 911, coming to the Emergency Department, and calling Suicide Hotline. Involve SW to refer to outpatient psychiatry/counseling  WithrBenjamine Mola 12/14/2015 11:08 AM

## 2015-12-14 NOTE — Consult Note (Signed)
Marland Kitchen    HEMATOLOGY/ONCOLOGY CONSULTATION NOTE  Date of Service: 12/14/2015  Patient Care Team: [name], NP as PCP - General (Family Medicine)  CHIEF COMPLAINTS/PURPOSE OF CONSULTATION:  Recurrent small cell lung cancer  HISTORY OF PRESENTING ILLNESS:   Rachel Chapman is a wonderful 75 y.o. 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 CT scan on 09/26/2014 at the outside hospital showed mediastinal adenopathy which was bronchoscopically biopsied and noted to be consistent with small cell lung cancer.  Patient completed 5 cycles of carboplatin etoposide with concurrent radiation therapy.PET CT scan 03/06/2015 after completion of 5 cycles of carboplatin and etoposide chemotherapy with concurrent radiation showed resolution of her lung mass.  Patient completed prophylactic cranial irradiation CT chest abdomen pelvis and MRI of the brain on 06/05/2015 shows no new evidence of metastatic disease.  Patient had a subsequent PET/CT on 10/11/2015 which showed new mild mediastinal lymphadenopathy with hypermetabolic activity consistent with recurrent carcinoma and probable right perihilar and paramediastinal radiation changes.  Patient was referred to cardiothoracic surgery to help biopsy the new lymph node to confirm recurrence. Patient had a mechanical fall and had a fracture of her right arm and she canceled her scheduled biopsy and did not schedule another one as recommended.  Even prior to the biopsy the patient was struggling about whether she would want to be treated if this was a recurrent cancer. She has demonstrated limited interest in trying to be physically active or engaging in other activities and has been predominantly staying in bed without any specific physical limitations. She was also given her psychiatry  referral for depression.  Patient is now admitted with increasing shortness of breath and fatigue and was noted to have a new right-sided pleural effusion. Her shortness of breath is better after tapping the pleural effusion. She had repeat CT of the chest that shows overt disease progression at this time. As per my discussion with Dr. Wynelle Cleveland the patient has been scheduled for an ultrasound-guided biopsy of supraclavicular lymph node to confirm disease progression.  No chest pain. Right arm pain is getting better. Patient was using a sling prior to admission.   MEDICAL HISTORY:  Past Medical History:  Diagnosis Date  . Anemia   . Anxiety   . Cancer ([place])   . Diabetes ([place])    Type II  . Dyslipidemia   . History of blood transfusion   . Hypertension   . Hypothyroidism   . Lung cancer ([place]) 8/216   hilar/mediastinum lung scca  . Pneumonia 2007  . Shortness of breath dyspnea    with exertion  . Smoker    1.5 packs per day for 60 years.  Quit one month ago    SURGICAL HISTORY: Past Surgical History:  Procedure Laterality Date  . CESAREAN SECTION    . EYE SURGERY Bilateral    Cataract    SOCIAL HISTORY: Social History   Social History  . Marital status: Widowed    Spouse name: N/A  . Number of children: N/A  . Years of education: N/A   Occupational History  . Not on file.   Social History Main Topics  . Smoking status: Former Smoker    Packs/day: 1.50    Years: 60.00    Quit date: 09/12/2014  . Smokeless tobacco: Never Used  .  Alcohol use No  . Drug use: No  . Sexual activity: No   Other Topics Concern  . Not on file   Social History Narrative   She has moved from [place]   She is retired from working with special education    Was married widowed twice.           FAMILY HISTORY: Family History  Problem Relation Age of Onset  . Cancer Mother     lung non smoker  . Diabetes Mother   . Cancer Sister     colon  . Diabetes Sister   . Clotting disorder  Neg Hx     ALLERGIES:  has No Known Allergies.  MEDICATIONS:  Current Facility-Administered Medications  Medication Dose Route Frequency Provider Last Rate Last Dose  . acetaminophen (TYLENOL) tablet 650 mg  650 mg Oral Q6H PRN Norval Morton, MD       Or  . acetaminophen (TYLENOL) suppository 650 mg  650 mg Rectal Q6H PRN Norval Morton, MD      . albuterol (PROVENTIL) (2.5 MG/3ML) 0.083% nebulizer solution 2.5 mg  2.5 mg Nebulization Q2H PRN Norval Morton, MD   2.5 mg at 12/13/15 0543  . citalopram (CELEXA) tablet 20 mg  20 mg Oral Daily Velvet Bathe, MD   20 mg at 12/14/15 0943  . dextromethorphan-guaiFENesin (MUCINEX DM) 30-600 MG per 12 hr tablet 1 tablet  1 tablet Oral BID Debbe Odea, MD   1 tablet at 12/14/15 0942  . [START ON 12/15/2015] enoxaparin (LOVENOX) injection 40 mg  40 mg Subcutaneous Q24H Saverio Danker, PA-C      . insulin aspart (novoLOG) injection 0-15 Units  0-15 Units Subcutaneous TID WC Velvet Bathe, MD   5 Units at 12/14/15 1313  . insulin aspart (novoLOG) injection 0-5 Units  0-5 Units Subcutaneous QHS Velvet Bathe, MD   4 Units at 12/13/15 2122  . insulin aspart (novoLOG) injection 3 Units  3 Units Subcutaneous TID WC Saima Rizwan, MD      . insulin detemir (LEVEMIR) injection 10 Units  10 Units Subcutaneous QHS Saima Rizwan, MD      . ipratropium-albuterol (DUONEB) 0.5-2.5 (3) MG/3ML nebulizer solution 3 mL  3 mL Nebulization TID Velvet Bathe, MD   3 mL at 12/14/15 1449  . levothyroxine (SYNTHROID, LEVOTHROID) tablet 112 mcg  112 mcg Oral QAC breakfast Velvet Bathe, MD   112 mcg at 12/14/15 0943  . LORazepam (ATIVAN) injection 0.5 mg  0.5 mg Intravenous Q6H PRN Norval Morton, MD   0.5 mg at 12/11/15 2351  . mirtazapine (REMERON) tablet 15 mg  15 mg Oral QHS Velvet Bathe, MD   15 mg at 12/13/15 2121  . ondansetron (ZOFRAN) tablet 4 mg  4 mg Oral Q6H PRN Norval Morton, MD       Or  . ondansetron (ZOFRAN) injection 4 mg  4 mg Intravenous Q6H PRN Rondell A  Tamala Julian, MD      . predniSONE (DELTASONE) tablet 60 mg  60 mg Oral Q breakfast Debbe Odea, MD   60 mg at 12/14/15 0942  . sodium chloride flush (NS) 0.9 % injection 10-40 mL  10-40 mL Intracatheter Q12H Velvet Bathe, MD   10 mL at 12/12/15 2216  . sodium chloride flush (NS) 0.9 % injection 10-40 mL  10-40 mL Intracatheter PRN Velvet Bathe, MD       Facility-Administered Medications Ordered in Other Encounters  Medication Dose Route Frequency Provider Last Rate Last Dose  .  sodium chloride 0.9 % injection 10 mL  10 mL Intracatheter PRN [name], MD   10 mL at 11/11/14 1131  . sodium chloride 0.9 % injection 10 mL  10 mL Intravenous PRN [name], MD   10 mL at 01/18/15 1527    REVIEW OF SYSTEMS:    10 Point review of Systems was done is negative except as noted above.  PHYSICAL EXAMINATION: ECOG PERFORMANCE STATUS: 2 - Symptomatic, <50% confined to bed  . Vitals:   12/14/15 0715 12/14/15 1446  BP:  (!) 148/83  Pulse: 99 96  Resp: 18 18  Temp:  98.1 F (36.7 C)   Filed Weights   12/11/15 0347  Weight: 148 lb 2.4 oz (67.2 kg)   .Body mass index is 27.1 kg/m.  GENERAL:alert, in no acute distress and comfortable SKIN: skin color, texture, turgor are normal, no rashes or significant lesions EYES: normal, conjunctiva are pink and non-injected, sclera clear OROPHARYNX:no exudate, no erythema and lips, buccal mucosa, and tongue normal  NECK: supple, no JVD, thyroid normal size, non-tender, without nodularity LYMPH:  no palpable lymphadenopathy in the cervical, axillary or inguinal LUNGS: clear to auscultation with normal respiratory effort, decreased in the right lung base  HEART: regular rate & rhythm,  no murmurs and no lower extremity edema ABDOMEN: abdomen soft, non-tender, normoactive bowel sounds  Musculoskeletal: no cyanosis of digits and no clubbing  PSYCH: alert & oriented x 3 with fluent speech NEURO: no focal motor/sensory deficits  LABORATORY  DATA:  I have reviewed the data as listed  . CBC Latest Ref Rng & Units 12/11/2015 12/11/2015 10/31/2015  WBC 4.0 - 10.5 K/uL 10.8(H) 10.6(H) 7.2  Hemoglobin 12.0 - 15.0 g/dL 9.6(L) 10.0(L) 11.5(L)  Hematocrit 36.0 - 46.0 % 29.9(L) 30.9(L) 34.4(L)  Platelets 150 - 400 K/uL 154 157 176    CMP Latest Ref Rng & Units 12/14/2015 12/11/2015 12/11/2015  Glucose 65 - 99 mg/dL - 477(H) 450(H)  BUN 6 - 20 mg/dL - 26(H) 29(H)  Creatinine 0.44 - 1.00 mg/dL 1.40(H) 1.31(H) 1.36(H)  Sodium 135 - 145 mmol/L - 132(L) 134(L)  Potassium 3.5 - 5.1 mmol/L - 4.6 4.5  Chloride 101 - 111 mmol/L - 100(L) 100(L)  CO2 22 - 32 mmol/L - 25 26  Calcium 8.9 - 10.3 mg/dL - 7.9(L) 8.5(L)  Total Protein 6.5 - 8.1 g/dL - - -  Total Bilirubin 0.3 - 1.2 mg/dL - - -  Alkaline Phos 38 - 126 U/L - - -  AST 15 - 41 U/L - - -  ALT 14 - 54 U/L - - -     RADIOGRAPHIC STUDIES: I have personally reviewed the radiological images as listed and agreed with the findings in the report. Ct Chest W Contrast  Result Date: 12/13/2015 CLINICAL DATA:  Lung cancer recurrence. EXAM: CT CHEST WITH CONTRAST TECHNIQUE: Multidetector CT imaging of the chest was performed during intravenous contrast administration. CONTRAST:  25m ISOVUE-300 IOPAMIDOL (ISOVUE-300) INJECTION 61% COMPARISON:  09/14/2015 FINDINGS: Cardiovascular: The heart size is normal. The heart size is normal. There is a new small pericardial effusion. Aortic atherosclerosis noted. Calcification within the LAD coronary artery is identified. Mediastinum/Nodes: The trachea appears patent and is midline. Normal appearance of the esophagus. Progression of mediastinal adenopathy. Index right paratracheal lymph node measures 1.6 cm, image 34 of series 2. Previously 1.0 cm. Index pre-vascular lymph node measures 2.2 cm, image 43 of series 2. Previously 0.9 cm. New bilateral supraclavicular adenopathy. Index right supraclavicular node,  image number 12 of series 2. Measures 1.6 cm index  left supraclavicular lymph node measures 1.4 cm. Lungs/Pleura: Large central right perihilar mass is identified and is new from previous exam compatible with local tumor recurrence. The mass measures 9 x 10.9 by 8.2 cm, image 62 of series 2. There is narrowing of the right lower lobe, right middle lobe and right upper lobe airways. There is also significant mass effect upon the branches of the right pulmonary artery. New right upper lobe pulmonary nodule measures 9 mm, image number 49 of series 4. Perifissural nodule within the right middle lobe measures 11 mm, image number 60 of series 4. New from previous exam. There is a moderate to large right pleural effusion which is new from previous exam. Upper Abdomen: Multiple small stones identified within the gallbladder. The adrenal glands appear normal. No acute findings noted within the upper abdomen. Musculoskeletal: The bones appear diffusely osteopenic. Chronic fracture deformity involving the proximal right humerus noted. IMPRESSION: 1. Examination positive for recurrence of disease. 2. Compared with 09/14/2015 there has been marked progression of mediastinal and bilateral supraclavicular nodal metastases. 3. Large right perihilar lung mass is new from the previous exam. 4. New right pleural effusion with perifissural nodularity suspicious for pleural spread of tumor. 5. Small pericardial effusion is new from previous exam. Cannot rule out malignant pericardial effusion. 6. Gallstones Electronically Signed   By: Kerby Moors M.D.   On: 12/13/2015 16:36   Dg Chest Port 1 View  Result Date: 12/11/2015 CLINICAL DATA:  75 year old female with a history of right-sided small cell carcinoma. Status post right-sided thoracentesis EXAM: PORTABLE CHEST 1 VIEW COMPARISON:  PET-CT 10/11/2015, chest x-ray 12/11/2015 1:16 a.m. FINDINGS: Cardiomediastinal silhouette unchanged. Dense opacity persists in the right hilar region. Calcifications of the aortic arch. Since the  prior there is improved aeration on the right with improved visualization the right hemidiaphragm. No visualized pneumothorax. Trace thickening of the minor fissure persists. No evidence of left-sided pleural effusion. Unchanged position of right IJ port catheter. Right humeral fracture again noted, better seen on plain film of September 2017. IMPRESSION: Status post right-sided thoracentesis, with improved aeration and no visualized pneumothorax. Hilar opacity in this patient with known small cell carcinoma, may represent a combination of treatment effect, adenopathy, residual pleural fluid, atelectasis/consolidation, or parenchymal involvement. Unchanged right IJ port catheter. Re- demonstration of known right humerus fracture. Signed, Dulcy Fanny. Earleen Newport, DO [facility] Radiology Electronically Signed   By: Corrie Mckusick D.O.   On: 12/11/2015 11:04   Dg Chest Port 1 View  Result Date: 12/11/2015 CLINICAL DATA:  Acute onset of shortness of breath. Recently diagnosed with bronchitis. Initial encounter. EXAM: PORTABLE CHEST 1 VIEW COMPARISON:  PET/CT performed 10/11/2015 FINDINGS: A moderate right-sided pleural effusion is noted, with right-sided airspace opacity. This may reflect pneumonia. Underlying recurrent malignancy is a concern. The left lung appears relatively clear.  No pneumothorax is seen. The cardiomediastinal silhouette is enlarged. Right perihilar postradiation change is again noted. A right-sided chest port is noted ending about the mid SVC. No acute osseous abnormalities are seen. IMPRESSION: Moderate right-sided pleural effusion, with right-sided airspace opacity. This may reflect pneumonia. Underlying recurrent malignancy is a concern. Diagnostic thoracentesis could be considered for further evaluation, or PET/CT could be considered 3-4 weeks after completion of treatment for pneumonia. Electronically Signed   By: Garald Balding M.D.   On:  12/11/2015 01:35   US Thoracentesis Asp Pleural Space W/img Guide  Result  Date: 12/11/2015 INDICATION: History of treated small cell lung cancer and recent diagnosis of bronchitis. Now admitted with dyspnea and a moderate right pleural effusion. Request is made for diagnostic and therapeutic thoracentesis. EXAM: ULTRASOUND GUIDED DIAGNOSTIC AND THERAPEUTIC THORACENTESIS MEDICATIONS: 1% lidocaine COMPLICATIONS: None immediate. PROCEDURE: An ultrasound guided thoracentesis was thoroughly discussed with the patient and questions answered. The benefits, risks, alternatives and complications were also discussed. The patient understands and wishes to proceed with the procedure. Written consent was obtained. Ultrasound was performed to localize and mark an adequate pocket of fluid in the right chest. The area was then prepped and draped in the normal sterile fashion. 1% Lidocaine was used for local anesthesia. Under ultrasound guidance a Safe-T-Centesis catheter was introduced. Thoracentesis was performed. The catheter was removed and a dressing applied. FINDINGS: A total of approximately 1 L of clear yellow fluid was removed. Samples were sent to the laboratory as requested by the clinical team. IMPRESSION: Successful ultrasound guided right thoracentesis yielding 1 L of pleural fluid. Read by: Saverio Danker, PA-C Electronically Signed   By: Jerilynn Mages.  Shick M.D.   On: 12/11/2015 10:48    ASSESSMENT & PLAN:   75 year old female with a performance status of 2 with  #1 Likely recurrent small cell lung cancer. Patient was previously diagnosed with limited stage small cell lung cancer and was treated with concurrent carboplatin and etoposide with radiation therapy and was able to tolerate 5 cycles of treatment with complete remission. No known brain metastases previously. Completed prophylactic cranial irradiation. PET/CT scan on 10/11/2015 with concern for new mediastinal FDG avid lymphadenopathy. MRI of the brain  on 10/18/2015 was negative for metastatic disease CT scan of the chest on 12/13/2015 shows overt disease progression Plan -Patient has been scheduled for an ultrasound-guided biopsy of her supraclavicular lymph node for histologic confirmation of her diagnosis. -I discussed the results of her CT scan and clinical concerns in detail with her. She is understandably anxious and notes that "she does not want to die". Patient is currently too overwhelmed to determine how she would want to proceed with regards to treatment.  -She is agreeable to the plan for biopsy. -Discussed that additional treatment would likely not be curative. -Given she is more than 6 months out from her previous completion of treatment she might have platinum sensitive disease and we would likely treat her with carboplatin and etoposide again if she chooses to proceed with treatment. -On her request I called her daughter Langley Gauss on her cell phone number and discussed the CT scan findings and clinical concerns at this time. Patient wanted me to let her daughter know that she wanted them to be with her and I passed on this message. We discussed that she might be able to possibly be discharged after her ultrasound-guided biopsy if she is otherwise feeling okay and we will try to get her to her clinic early to mid next week for further discussion regarding treatment and to discuss the final pathology results. -Kindly call if any additional questions for me.  All of the patients questions were answered with apparent satisfaction. The patient knows to call the clinic with any problems, questions or concerns.  I spent 70 minutes counseling the patient face to face. The total time spent in the appointment was 60 minutes and more than 50% was on counseling and direct patient cares.    Sullivan Lone MD [place] AAHIVMS [facility] Hematology/Oncology Physician [place]  (Office):  [phone] (Work cell):  [phone] (Fax):            [phone]  12/14/2015 5:16 PM

## 2015-12-14 NOTE — Progress Notes (Signed)
PROGRESS NOTE    Rachel Chapman  VVO:[id number] DOB: 01-28-1941 DOA: 12/11/2015  PCP: [name], NP   Brief Narrative:  75 y.o.femalewith medical history significant of SCLC s/p radiation and chemo,COPD, DM type 2,HTN, HLD, hypothyroidism, anemia, and anxietywho presents with worsening shortness of breath.   Subjective: Able to cough up sputum more easily today and not as congested. Able to walk to nursing station yesterday.  Assessment & Plan:   Principal Problem: Acute respiratory failure with hypoxia and hypercapnia  Component Value Date/Time   PHART 7.285 (L) 12/11/2015 0150   PCO2ART 55.2 (H) 12/11/2015 0150   PO2ART 160 (H) 12/11/2015 0150   HCO3 25.6 12/11/2015 0150   ACIDBASEDEF 1.3 12/11/2015 0150   O2SAT 99.1 12/11/2015 0150   (A)   Pleural effusion on right- ? HCAP   - s/p thoracentesis on 10/31- fluid is transdative per lights criteria using glucose and reveals 541 wbc, mostly lymphs and monocytes- no malignant cells- ECHO - no underlying pneumonia seen after effusion drained- f/u CT- if no infiltrates, will d/c antibiotics  (B) COPD? - no formal diagnosis - quit smoking a little over a year ago - admits to mucous which she can here in her chest but is not able to cough up- cant walk around the room without getting short of breath- her family states she barely ambulates at home which they suspect is due to depression.  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- treated with chemo, radiation to lungs and prophylactic radiation to  head - recent PET 10/11/15 showing new mediastinal lymphadenopathy consistent with recurrent carcinoma- she has not had a biopsy yet as mentioned by Dr Irene Limbo in his last note because she did not want to go through with it because she fell and fractured her arm -  CT chest reveals progression of mediastinal, b/lo supraclavicular notes, R pl effusion and nodularity and small pericardial effusion-  see report below - IR guided bx of supraclavicular lymph node tomorrow  Severe anxiety and depression - celexa, Lorazepam- consult psych as she has continued depression-     Chronic kidney disease, stage III (moderate) - stable  DVT prophylaxis: Lovenox Code Status: Full code Family Communication: son Disposition Plan: home when stable Consultants:    Procedures:  2 D ECHO Left ventricle: The cavity size was normal. There was moderate   focal basal hypertrophy of the septum. Systolic function was   normal. The estimated ejection fraction was in the range of 60%   to 65%. Wall motion was normal; there were no regional wall   motion abnormalities. The study is not technically sufficient to   allow evaluation of LV diastolic function. - Aortic valve: Trileaflet; mildly thickened, mildly calcified   leaflets. - Mitral valve: Calcified annulus. - Pericardium, extracardiac: A small pericardial effusion was   identified circumferential to the heart. Antimicrobials:  Anti-infectives    Start     Dose/Rate Route Frequency Ordered Stop   12/11/15 1200  piperacillin-tazobactam (ZOSYN) IVPB 3.375 g  Status:  Discontinued  3.375 g 12.5 mL/hr over 240 Minutes Intravenous Every 8 hours 12/11/15 0332 12/14/15 0858   12/11/15 0315  vancomycin (VANCOCIN) IVPB 1000 mg/200 mL premix  Status:  Discontinued     1,000 mg 200 mL/hr over 60 Minutes Intravenous Daily 12/11/15 0301 12/12/15 1330   12/11/15 0300  piperacillin-tazobactam (ZOSYN) IVPB 3.375 g     3.375 g 100 mL/hr over 30 Minutes Intravenous NOW  12/11/15 0300 12/11/15 0446       Objective: Vitals:   12/13/15 1921 12/13/15 2107 12/14/15 0549 12/14/15 0715  BP:  (!) 143/77 (!) 156/79   Pulse:  (!) 108 99 99  Resp:  20  18  Temp:  98.8 F (37.1 C) 97.9 F (36.6 C)   TempSrc:  Oral Oral   SpO2: 97% 96% 99% 97%  Weight:      Height:        Intake/Output Summary (Last 24 hours) at 12/14/15 1327 Last data filed at 12/14/15 0939  Gross per 24 hour  Intake              880 ml  Output                0 ml  Net              880 ml   Filed Weights   12/11/15 0347  Weight: 67.2 kg (148 lb 2.4 oz)    Examination: General exam: Appears comfortable  HEENT: PERRLA, oral mucosa moist, no sclera icterus or thrush Respiratory system: Clear to auscultation. Respiratory effort normal. Cardiovascular system: S1 & S2 heard, RRR.  No murmurs  Gastrointestinal system: Abdomen soft, non-tender, nondistended. Normal bowel sound. No organomegaly Central nervous system: Alert and oriented. No focal neurological deficits. Extremities: No cyanosis, clubbing or edema Skin: No rashes or ulcers Psychiatry:  Mood & affect appropriate.     Data Reviewed: I have personally reviewed following labs and imaging studies  CBC:  Recent Labs Lab 12/11/15 0137 12/11/15 0415  WBC 10.6* 10.8*  NEUTROABS 9.2*  --   HGB 10.0* 9.6*  HCT 30.9* 29.9*  MCV 101.6* 101.4*  PLT 157 245   Basic Metabolic Panel:  Recent Labs Lab 12/11/15 0137 12/11/15 0415 12/11/15 0704 12/14/15 0435  NA 134* 134* 132*  --   K 4.3 4.5 4.6  --   CL 100* 100* 100*  --   CO2 '27 26 25  '$ --   GLUCOSE 391* 450* 477*  --   BUN 28* 29* 26*  --   CREATININE 1.37* 1.36* 1.31* 1.40*  CALCIUM 8.5* 8.5* 7.9*  --    GFR: Estimated Creatinine Clearance: 30.2 mL/min (by C-G formula based on SCr of 1.4 mg/dL (H)). Liver Function Tests:  Recent Labs Lab 12/11/15 0137  AST 17  ALT 16  ALKPHOS 81  BILITOT 0.4  PROT 6.4*  ALBUMIN 3.4*   No results for input(s):  LIPASE, AMYLASE in the last 168 hours. No results for input(s): AMMONIA in the last 168 hours. Coagulation Profile:  Recent Labs Lab 12/11/15 0415  INR 0.97   Cardiac Enzymes: No results for input(s): CKTOTAL, CKMB, CKMBINDEX, TROPONINI in the last 168 hours. BNP (last 3 results) No results for input(s): PROBNP in the last 8760 hours. HbA1C: No results for input(s): HGBA1C in the last 72 hours. CBG:  Recent Labs Lab 12/13/15 1223 12/13/15 1650 12/13/15 2103 12/14/15 0756 12/14/15 1153  GLUCAP 423* 322* 305* 105* 220*   Lipid Profile: No results for input(s):  CHOL, HDL, LDLCALC, TRIG, CHOLHDL, LDLDIRECT in the last 72 hours. Thyroid Function Tests: No results for input(s): TSH, T4TOTAL, FREET4, T3FREE, THYROIDAB in the last 72 hours. Anemia Panel: No results for input(s): VITAMINB12, FOLATE, FERRITIN, TIBC, IRON, RETICCTPCT in the last 72 hours. Urine analysis:    Component Value Date/Time   COLORURINE YELLOW 11/25/2014 [place] 11/25/2014 1526   LABSPEC 1.006 11/25/2014 1526   PHURINE 6.5 11/25/2014 1526   GLUCOSEU NEGATIVE 11/25/2014 1526   HGBUR NEGATIVE 11/25/2014 [place] 11/25/2014 [place] 11/25/2014 1526   PROTEINUR NEGATIVE 11/25/2014 1526   UROBILINOGEN 0.2 11/25/2014 1526   NITRITE NEGATIVE 11/25/2014 1526   LEUKOCYTESUR TRACE (A) 11/25/2014 1526   Sepsis Labs: '@LABRCNTIP'$ (procalcitonin:4,lacticidven:4) ) Recent Results (from the past 240 hour(s))  MRSA PCR Screening     Status: None   Collection Time: 12/11/15  3:49 AM  Result Value Ref Range Status   MRSA by PCR NEGATIVE NEGATIVE Final    Comment:        The GeneXpert MRSA Assay (FDA approved for NASAL specimens only), is one component of a comprehensive MRSA colonization surveillance program. It is not intended to diagnose MRSA infection nor to guide or monitor treatment for MRSA infections.   Culture, blood (x 2)     Status: None  (Preliminary result)   Collection Time: 12/11/15  4:15 AM  Result Value Ref Range Status   Specimen Description BLOOD RIGHT FOREARM  Final   Special Requests IN PEDIATRIC BOTTLE 3CC  Final   Culture   Final    NO GROWTH 3 DAYS Performed at [facility]    Report Status PENDING  Incomplete  Culture, blood (x 2)     Status: None (Preliminary result)   Collection Time: 12/11/15  7:04 AM  Result Value Ref Range Status   Specimen Description BLOOD RIGHT ARM  Final   Special Requests BOTTLES DRAWN AEROBIC AND ANAEROBIC 5CC  Final   Culture   Final    NO GROWTH 3 DAYS Performed at [facility]    Report Status PENDING  Incomplete  Body fluid culture     Status: None   Collection Time: 12/11/15 10:49 AM  Result Value Ref Range Status   Specimen Description PLEURAL RIGHT  Final   Special Requests NONE  Final   Gram Stain   Final    FEW WBC PRESENT,BOTH PMN AND MONONUCLEAR NO ORGANISMS SEEN    Culture   Final    No growth aerobically or anaerobically. Performed at [facility]    Report Status 12/14/2015 FINAL  Final         Radiology Studies: Ct Chest W Contrast  Result Date: 12/13/2015 CLINICAL DATA:  Lung cancer recurrence. EXAM: CT CHEST WITH CONTRAST TECHNIQUE: Multidetector CT imaging of the chest was performed during intravenous contrast administration. CONTRAST:  64m ISOVUE-300 IOPAMIDOL (ISOVUE-300) INJECTION 61% COMPARISON:  09/14/2015 FINDINGS: Cardiovascular: The heart size is normal. The heart size is normal. There is a new small pericardial effusion. Aortic atherosclerosis noted. Calcification within the LAD coronary artery is identified. Mediastinum/Nodes: The trachea appears patent and is midline. Normal appearance of the esophagus. Progression of mediastinal adenopathy. Index right paratracheal lymph node measures 1.6 cm, image 34 of series 2. Previously 1.0 cm. Index pre-vascular lymph node measures 2.2 cm, image 43 of series 2. Previously 0.9  cm. New bilateral supraclavicular adenopathy. Index right supraclavicular node, image number 12 of series 2. Measures  1.6 cm index left supraclavicular lymph node measures 1.4 cm. Lungs/Pleura: Large central right perihilar mass is identified and is new from previous exam compatible with local tumor recurrence. The mass measures 9 x 10.9 by 8.2 cm, image 62 of series 2. There is narrowing of the right lower lobe, right middle lobe and right upper lobe airways. There is also significant mass effect upon the branches of the right pulmonary artery. New right upper lobe pulmonary nodule measures 9 mm, image number 49 of series 4. Perifissural nodule within the right middle lobe measures 11 mm, image number 60 of series 4. New from previous exam. There is a moderate to large right pleural effusion which is new from previous exam. Upper Abdomen: Multiple small stones identified within the gallbladder. The adrenal glands appear normal. No acute findings noted within the upper abdomen. Musculoskeletal: The bones appear diffusely osteopenic. Chronic fracture deformity involving the proximal right humerus noted. IMPRESSION: 1. Examination positive for recurrence of disease. 2. Compared with 09/14/2015 there has been marked progression of mediastinal and bilateral supraclavicular nodal metastases. 3. Large right perihilar lung mass is new from the previous exam. 4. New right pleural effusion with perifissural nodularity suspicious for pleural spread of tumor. 5. Small pericardial effusion is new from previous exam. Cannot rule out malignant pericardial effusion. 6. Gallstones Electronically Signed   By: Kerby Moors M.D.   On: 12/13/2015 16:36      Scheduled Meds: . citalopram  20 mg Oral Daily  . dextromethorphan-guaiFENesin  1 tablet Oral BID  . enoxaparin (LOVENOX) injection  40 mg Subcutaneous Q24H  . insulin aspart  0-15 Units Subcutaneous TID WC  . insulin aspart  0-5 Units Subcutaneous QHS  .  ipratropium-albuterol  3 mL Nebulization TID  . levothyroxine  112 mcg Oral QAC breakfast  . mirtazapine  15 mg Oral QHS  . predniSONE  60 mg Oral Q breakfast  . sodium chloride flush  10-40 mL Intracatheter Q12H   Continuous Infusions:    LOS: 3 days    Time spent in minutes: 79    Sade Mehlhoff, MD [facility] Pager: [email or web address] Password [email or web address] 12/14/2015, 1:27 PM

## 2015-12-15 ENCOUNTER — Inpatient Hospital Stay (HOSPITAL_COMMUNITY): Payer: Medicare Other

## 2015-12-15 DIAGNOSIS — F331 Major depressive disorder, recurrent, moderate: Secondary | ICD-10-CM

## 2015-12-15 DIAGNOSIS — J441 Chronic obstructive pulmonary disease with (acute) exacerbation: Secondary | ICD-10-CM

## 2015-12-15 LAB — GLUCOSE, CAPILLARY
GLUCOSE-CAPILLARY: 121 mg/dL — AB (ref 65–99)
GLUCOSE-CAPILLARY: 391 mg/dL — AB (ref 65–99)
Glucose-Capillary: 101 mg/dL — ABNORMAL HIGH (ref 65–99)
Glucose-Capillary: 292 mg/dL — ABNORMAL HIGH (ref 65–99)

## 2015-12-15 MED ORDER — MIDAZOLAM HCL 2 MG/2ML IJ SOLN
INTRAMUSCULAR | Status: AC | PRN
  Administered 2015-12-15: 1 mg via INTRAVENOUS

## 2015-12-15 MED ORDER — FENTANYL CITRATE (PF) 100 MCG/2ML IJ SOLN
INTRAMUSCULAR | Status: AC | PRN
  Administered 2015-12-15: 50 ug via INTRAVENOUS

## 2015-12-15 MED ORDER — CITALOPRAM HYDROBROMIDE 10 MG PO TABS: 10.0000 mg | ORAL_TABLET | Freq: Every day | ORAL | 3 refills | Status: AC

## 2015-12-15 MED ORDER — PREDNISONE 10 MG PO TABS: 60.0000 mg | ORAL_TABLET | Freq: Every day | ORAL | 0 refills | Status: DC

## 2015-12-15 MED ORDER — DM-GUAIFENESIN ER 30-600 MG PO TB12: 1.0000 | ORAL_TABLET | Freq: Two times a day (BID) | ORAL | 0 refills | Status: AC | PRN

## 2015-12-15 MED ORDER — MIDAZOLAM HCL 2 MG/2ML IJ SOLN
INTRAMUSCULAR | Status: AC
  Filled 2015-12-15: qty 6

## 2015-12-15 MED ORDER — PREDNISONE 50 MG PO TABS
50.0000 mg | ORAL_TABLET | Freq: Every day | ORAL | Status: DC
  Administered 2015-12-16: 50 mg via ORAL
  Filled 2015-12-15: qty 1

## 2015-12-15 MED ORDER — FENTANYL CITRATE (PF) 100 MCG/2ML IJ SOLN
INTRAMUSCULAR | Status: AC
  Filled 2015-12-15: qty 4

## 2015-12-15 NOTE — Procedures (Signed)
Interventional Radiology Procedure Note  Procedure: US guided biopsy of right supraclavicular lymph node.  Multiple 44Q core  Complications: None Recommendations:  - Ice pack for ecchymosis. 75mn on, 20 minutes off - bandaid for the puncture site. - Do not submerge - Routine care - follow up pathology  Signed,  JDulcy Fanny WEarleen Newport DO

## 2015-12-15 NOTE — Progress Notes (Signed)
PROGRESS NOTE    Rachel Chapman  TIW:[id number] DOB: Oct 03, 1940 DOA: 12/11/2015  PCP: [name], NP   Brief Narrative:  75 y.o.femalewith medical history significant of SCLC s/p radiation and chemo,COPD, DM type 2,HTN, HLD, hypothyroidism, anemia, and anxietywho presents with worsening shortness of breath.   Subjective: Not much cough. Doing well s/p biopsy.  Tells me today that no family will be home this weekend and she wants to go to a SNF.   Assessment & Plan:   Principal Problem: Acute respiratory failure with hypoxia and hypercapnia  Component Value Date/Time   PHART 7.285 (L) 12/11/2015 0150   PCO2ART 55.2 (H) 12/11/2015 0150   PO2ART 160 (H) 12/11/2015 0150   HCO3 25.6 12/11/2015 0150   ACIDBASEDEF 1.3 12/11/2015 0150   O2SAT 99.1 12/11/2015 0150   (A)   Pleural effusion on right- ? HCAP   - s/p thoracentesis on 10/31- fluid is transdative per lights criteria using glucose and reveals 541 wbc, mostly lymphs and monocytes- no malignant cells- ECHO - no underlying pneumonia seen after effusion drained-  CT>> no infiltrates, d/c'd antibiotics  (B) COPD? - no formal diagnosis - quit smoking a little over a year ago - admits to mucous which she can here in her chest but is not able to cough up- cant walk around the room without getting short of breath- her family states she barely ambulates at home which they suspect is due to depression.  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- treated with chemo, radiation to lungs and prophylactic  radiation to head - recent PET 10/11/15 showing new mediastinal lymphadenopathy consistent with recurrent carcinoma- she has not had a biopsy yet as mentioned by Dr Irene Limbo in his last note because she did not want to go through with it because she fell and fractured her arm -  CT chest reveals progression of mediastinal, b/lo supraclavicular notes, R pl effusion and nodularity and small pericardial effusion-  see report below - IR guided bx of supraclavicular lymph node today  Severe anxiety and depression - celexa, Lorazepam- consult psych as she has continued depression-     Chronic kidney disease, stage III (moderate) - stable  DVT prophylaxis: Lovenox Code Status: Full code Family Communication: son Disposition Plan:  PT eval to see if she qualifies for SNF Consultants:   IR Procedures:  Right supraclavicular lymph node biopsy  2 D ECHO Left ventricle: The cavity size was normal. There was moderate   focal basal hypertrophy of the septum. Systolic function was   normal. The estimated ejection fraction was in the range of 60%   to 65%. Wall motion was normal; there were no regional wall   motion abnormalities. The study is not technically sufficient to   allow evaluation of LV diastolic function. - Aortic valve: Trileaflet; mildly thickened, mildly calcified   leaflets. - Mitral valve: Calcified annulus. - Pericardium, extracardiac: A small pericardial effusion was   identified circumferential to the heart. Antimicrobials:  Anti-infectives    Start     Dose/Rate Route  Frequency Ordered Stop   12/11/15 1200  piperacillin-tazobactam (ZOSYN) IVPB 3.375 g  Status:  Discontinued     3.375 g 12.5 mL/hr over 240 Minutes Intravenous Every 8 hours 12/11/15 0332 12/14/15 0858   12/11/15 0315  vancomycin (VANCOCIN) IVPB 1000 mg/200 mL premix  Status:  Discontinued     1,000 mg 200 mL/hr over 60 Minutes Intravenous Daily 12/11/15 0301 12/12/15 1330   12/11/15 0300   piperacillin-tazobactam (ZOSYN) IVPB 3.375 g     3.375 g 100 mL/hr over 30 Minutes Intravenous NOW 12/11/15 0300 12/11/15 0446       Objective: Vitals:   12/15/15 1518 12/15/15 1521 12/15/15 1545 12/15/15 1645  BP: 130/71 137/76 (!) 128/50 137/66  Pulse: (!) 103 (!) 104 94 (!) 110  Resp: 14 14    Temp:   97.7 F (36.5 C)   TempSrc:   Oral   SpO2: 94% 94% 94% 93%  Weight:      Height:       No intake or output data in the 24 hours ending 12/15/15 1845 Filed Weights   12/11/15 0347  Weight: 67.2 kg (148 lb 2.4 oz)    Examination: General exam: Appears comfortable  HEENT: PERRLA, oral mucosa moist, no sclera icterus or thrush Respiratory system: Clear to auscultation. Respiratory effort normal. Cardiovascular system: S1 & S2 heard, RRR.  No murmurs  Gastrointestinal system: Abdomen soft, non-tender, nondistended. Normal bowel sound. No organomegaly Central nervous system: Alert and oriented. No focal neurological deficits. Extremities: No cyanosis, clubbing or edema Skin: No rashes or ulcers Psychiatry:  Mood & affect appropriate.     Data Reviewed: I have personally reviewed following labs and imaging studies  CBC:  Recent Labs Lab 12/11/15 0137 12/11/15 0415  WBC 10.6* 10.8*  NEUTROABS 9.2*  --   HGB 10.0* 9.6*  HCT 30.9* 29.9*  MCV 101.6* 101.4*  PLT 157 373   Basic Metabolic Panel:  Recent Labs Lab 12/11/15 0137 12/11/15 0415 12/11/15 0704 12/14/15 0435  NA 134* 134* 132*  --   K 4.3 4.5 4.6  --   CL 100* 100* 100*  --   CO2 '27 26 25  '$ --   GLUCOSE 391* 450* 477*  --   BUN 28* 29* 26*  --   CREATININE 1.37* 1.36* 1.31* 1.40*  CALCIUM 8.5* 8.5* 7.9*  --    GFR: Estimated Creatinine Clearance: 30.2 mL/min (by C-G formula based on SCr of 1.4 mg/dL (H)). Liver Function Tests:  Recent Labs Lab 12/11/15 0137  AST 17  ALT 16  ALKPHOS 81  BILITOT 0.4  PROT 6.4*  ALBUMIN 3.4*   No results for input(s): LIPASE, AMYLASE in the last 168  hours. No results for input(s): AMMONIA in the last 168 hours. Coagulation Profile:  Recent Labs Lab 12/11/15 0415  INR 0.97   Cardiac Enzymes: No results for input(s): CKTOTAL, CKMB, CKMBINDEX, TROPONINI in the last 168 hours. BNP (last 3 results) No results for input(s): PROBNP in the last 8760 hours. HbA1C: No results for input(s): HGBA1C in the last 72 hours. CBG:  Recent Labs Lab 12/14/15 1618 12/14/15 2131 12/15/15 0822 12/15/15 1139 12/15/15 1717  GLUCAP 407* 358* 121* 101* 292*   Lipid Profile: No results for input(s): CHOL, HDL, LDLCALC, TRIG, CHOLHDL, LDLDIRECT in the last 72 hours. Thyroid Function Tests: No results for input(s): TSH, T4TOTAL, FREET4, T3FREE, THYROIDAB in the last 72 hours. Anemia Panel: No results for input(s): VITAMINB12, FOLATE, FERRITIN, TIBC, IRON, RETICCTPCT in the last 72  hours. Urine analysis:    Component Value Date/Time   COLORURINE YELLOW 11/25/2014 [place] 11/25/2014 1526   LABSPEC 1.006 11/25/2014 1526   PHURINE 6.5 11/25/2014 1526   GLUCOSEU NEGATIVE 11/25/2014 1526   HGBUR NEGATIVE 11/25/2014 [place] 11/25/2014 [place] 11/25/2014 1526   PROTEINUR NEGATIVE 11/25/2014 1526   UROBILINOGEN 0.2 11/25/2014 1526   NITRITE NEGATIVE 11/25/2014 1526   LEUKOCYTESUR TRACE (A) 11/25/2014 1526   Sepsis Labs: '@LABRCNTIP'$ (procalcitonin:4,lacticidven:4) ) Recent Results (from the past 240 hour(s))  MRSA PCR Screening     Status: None   Collection Time: 12/11/15  3:49 AM  Result Value Ref Range Status   MRSA by PCR NEGATIVE NEGATIVE Final    Comment:        The GeneXpert MRSA Assay (FDA approved for NASAL specimens only), is one component of a comprehensive MRSA colonization surveillance program. It is not intended to diagnose MRSA infection nor to guide or monitor treatment for MRSA infections.   Culture, blood (x 2)     Status: None (Preliminary result)   Collection  Time: 12/11/15  4:15 AM  Result Value Ref Range Status   Specimen Description BLOOD RIGHT FOREARM  Final   Special Requests IN PEDIATRIC BOTTLE 3CC  Final   Culture   Final    NO GROWTH 4 DAYS Performed at [facility]    Report Status PENDING  Incomplete  Culture, blood (x 2)     Status: None (Preliminary result)   Collection Time: 12/11/15  7:04 AM  Result Value Ref Range Status   Specimen Description BLOOD RIGHT ARM  Final   Special Requests BOTTLES DRAWN AEROBIC AND ANAEROBIC 5CC  Final   Culture   Final    NO GROWTH 4 DAYS Performed at [facility]    Report Status PENDING  Incomplete  Body fluid culture     Status: None   Collection Time: 12/11/15 10:49 AM  Result Value Ref Range Status   Specimen Description PLEURAL RIGHT  Final   Special Requests NONE  Final   Gram Stain   Final    FEW WBC PRESENT,BOTH PMN AND MONONUCLEAR NO ORGANISMS SEEN    Culture   Final    No growth aerobically or anaerobically. Performed at [facility]    Report Status 12/14/2015 FINAL  Final         Radiology Studies: US Biopsy  Result Date: 12-25-2015 INDICATION: 75 year old female with a history of small cell carcinoma previously treated. Evidence of recurrence with new supraclavicular adenopathy. EXAM: ULTRASOUND BIOPSY CORE LIVER MEDICATIONS: None. ANESTHESIA/SEDATION: Moderate (conscious) sedation was employed during this procedure. A total of Versed 1.0 mg and Fentanyl 50 mcg was administered intravenously. Moderate Sedation Time: 10 minutes. The patient's level of consciousness and vital signs were monitored continuously by radiology nursing throughout the procedure under my direct supervision. FLUOROSCOPY TIME:  None COMPLICATIONS: None PROCEDURE: Informed written consent was obtained from the patient after a thorough discussion of the procedural risks, benefits and alternatives. All questions were addressed. Maximal Sterile Barrier Technique was utilized  including caps, mask, sterile gowns, sterile gloves, sterile drape, hand hygiene and skin antiseptic. A timeout was performed prior to the initiation of the procedure. Patient positioned supine position on the ultrasound table, with ultrasound survey of the right supraclavicular region and images stored sent to PACs. The patient is then prepped and draped in the usual sterile fashion. The skin and subcutaneous tissues  were generously infiltrated 1% lidocaine for local anesthesia. Using ultrasound guidance, multiple core biopsy were achieved of target lymph node. Final image was stored. Patient tolerated the procedure well and remained hemodynamically stable throughout. No complications were encountered and no significant blood loss. IMPRESSION: Status post ultrasound-guided core biopsy of right supraclavicular node. Tissue specimen sent to pathology for complete histopathologic analysis. Signed, Dulcy Fanny. Earleen Newport, DO [facility] Radiology Electronically Signed   By: Corrie Mckusick D.O.   On: 12/15/2015 15:34      Scheduled Meds: . citalopram  10 mg Oral Daily  . dextromethorphan-guaiFENesin  1 tablet Oral BID  . enoxaparin (LOVENOX) injection  40 mg Subcutaneous Q24H  . insulin aspart  0-15 Units Subcutaneous TID WC  . insulin aspart  0-5 Units Subcutaneous QHS  . insulin aspart  3 Units Subcutaneous TID WC  . insulin detemir  10 Units Subcutaneous QHS  . ipratropium-albuterol  3 mL Nebulization TID  . levothyroxine  112 mcg Oral QAC breakfast  . mirtazapine  15 mg Oral QHS  . [START ON 12/16/2015] predniSONE  50 mg Oral Q breakfast  . sodium chloride flush  10-40 mL Intracatheter Q12H   Continuous Infusions:    LOS: 4 days    Time spent in minutes: 56    Captain Cook, MD [facility] Pager: [email or web address] Password [facility] 12/15/2015, 6:45 PM

## 2015-12-15 NOTE — Care Management Important Message (Signed)
Important Message  Patient Details  Name: Rachel Chapman MRN: [id number] Date of Birth: Dec 23, 1940   Medicare Important Message Given:  Yes    Camillo Flaming 12/15/2015, 10:00 AMImportant Message  Patient Details  Name: Rachel Chapman MRN: [id number] Date of Birth: 07/19/1940   Medicare Important Message Given:  Yes    Camillo Flaming 12/15/2015, 10:00 AM

## 2015-12-15 NOTE — Progress Notes (Signed)
This CM noted order for home 02 and qualifying saturation screen. [facility] DME rep contacted for 02 delivery to the room.  Marney Doctor RN,BSN,NCM [phone]

## 2015-12-15 NOTE — Discharge Summary (Addendum)
Physician Discharge Summary  Rachel Chapman JIR:[id number] DOB: 1940/11/25 DOA: 12/11/2015  PCP: [name], NP  Admit date: 12/11/2015 Discharge date: 12/15/2015  Admitted From: home  Disposition:  Home with home health  Recommendations for Outpatient Follow-up:  1. F/u on lymph node biopsy- Dr Irene Limbo aware 2. High risk for recurrent pleural effusion- needs to be seen in office frequently as outpt 3. 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 No dyspnea at rest. + dyspnea on exertion. Able to walk around the nursing station for the past few days with minimal assistance. No pain.   Brief Summary: 75 y.o.femalewith medical history significant of SCLC s/p radiation and chemo,COPD, DM type 2,HTN, HLD, hypothyroidism, anemia, and anxietywho presents with worsening shortness of breath.  Hospital Course:  Acute respiratory failure with hypoxia and hypercapnia       Component Value Date/Time   PHART 7.285 (L) 12/11/2015 0150   PCO2ART 55.2 (H) 12/11/2015 0150   PO2ART 160 (H) 12/11/2015 0150   HCO3 25.6 12/11/2015 0150   ACIDBASEDEF 1.3 12/11/2015 0150   O2SAT 99.1 12/11/2015 0150   (A)   Pleural effusion on right- ? HCAP - s/p thoracentesis on 10/31- fluid is  transdative per lights criteria using glucose and reveals 541 wbc, mostly lymphs and monocytes- no malignant cells- ECHO - no underlying pneumonia seen on CXR or CT after effusion drained- d/c'd antibiotics- symptoms of cough resolving  (B) COPD? - no formal diagnosis - quit smoking a little over a year ago - admits to mucous which she can here in her chest but is not able to cough up- cant walk around the room without getting short of breath- her family states she barely ambulates at home which they suspect is due to depression.  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 - will need 2 L O2 on exertion- pulse ox drops to 82-88% on exertion    Small cell carcinoma of right lung - 12/16- treated with chemo, radiation to lungs and prophylactic radiation to head - recent PET 10/11/15 showing new mediastinal lymphadenopathy consistent with recurrent carcinoma- she has not had a biopsy yet as mentioned by Dr Irene Limbo in his last note because she did not want to go through with it because she fell and fractured her arm -  CT chest reveals progression of mediastinal, b/lo supraclavicular notes, R pl effusion and nodularity and small pericardial effusion-  see report below - IR guided bx of supraclavicular lymph node done 1/3    SIRS (systemic inflammatory response syndrome) -  tachycardic and tachypneic  - resolved  Recent right humural fracture -cont sling- outp f/u with rotho    Diabetes type 2 -- A1c 6.7 - she states her sugars are usually in the 200s- takes Januvia -  sugars elevated due to steroids which I am weaning- sugars have since improved - has been receiving insulin in the hospital    Hypothyroidism -  synthroid  Severe anxiety and depression - Celexa - consulted psych as she has continued depression - recommendations: -Decrease Celexa to '10mg'$  (to cross titrate downward)- we have done this -Continue Remeron '15mg'$  po qhs for MDD/insomnia (may titrate up to '30mg'$  outpatient in 2 weeks with concomitant cessation of Celexa '10mg'$ ) -Vistaril '25mg'$  po tid prn anxiety (use caution and keep lose-dose so as to avoid fall risk) NOTE: I have not yet ordered Vistaril as I have not witnessed episodic panic issues and she has already fallen and is a high risk for further falls  Rash due to Tradjenta - resolved with Pepcid, Benadryl- on steroids already    Chronic kidney disease, stage III (moderate) - stable  Severe deconditioning - cont home PT/ OT   Discharge Instructions  Discharge Instructions    Discharge instructions    Complete by:  As directed    Diabetic diet   Increase activity slowly    Complete by:  As directed        Medication List    TAKE these medications   albuterol 108 (90 Base) MCG/ACT inhaler Commonly known as:  PROVENTIL HFA;VENTOLIN HFA Inhale 2 puffs into the lungs every 6 (six) hours as needed for shortness of breath.   cholecalciferol 1000 units tablet Commonly known as:  VITAMIN D Take 1 tablet (1,000 Units total) by mouth daily.   citalopram 10 MG tablet Commonly known as:  CELEXA Take 1 tablet (10 mg total) by mouth daily. What changed:  how much to take   dextromethorphan-guaiFENesin 30-600 MG 12hr tablet Commonly known as:  MUCINEX DM Take 1 tablet by mouth 2 (two) times daily as needed for cough (congestion).   HYDROcodone-acetaminophen 5-325 MG tablet Commonly known as:  NORCO/VICODIN Take 1-2 tablets by mouth every 6 (six) hours as needed for moderate pain.   IRON PO Take 1 tablet by mouth daily.   JANUVIA 100 MG tablet Generic drug:  sitaGLIPtin TAKE ONE TABLET BY MOUTH ONCE DAILY   levothyroxine 112 MCG tablet Commonly known as:   SYNTHROID, LEVOTHROID TAKE ONE TABLET BY MOUTH TWICE DAILY   mirtazapine 15 MG tablet Commonly known as:  REMERON Take 1 tablet (15 mg total) by mouth at bedtime.   multivitamin with minerals Tabs tablet Take 1 tablet by mouth daily.   predniSONE 10 MG tablet Commonly known as:  DELTASONE Take 6 tablets (60 mg total) by mouth daily with breakfast. 50 mg tomorrow, 40 mg the next day, 30 mg the next day, 20 mg the next day, 10 mg the last day, 5 mg the last 2 days Start taking on:  12/16/2015   simvastatin 20 MG tablet Commonly known as:  ZOCOR Take 1 tablet (20 mg total) by mouth at bedtime.   vitamin B-12 1000 MCG tablet Commonly known as:  CYANOCOBALAMIN Take 1 tablet (1,000 mcg total) by mouth daily.   vitamin C 500 MG tablet Commonly known as:  ASCORBIC ACID Take 500 mg by mouth daily.       No Known Allergies   Procedures/Studies: 2 D ECHO Left ventricle: The cavity size was normal.  There was moderate focal basal hypertrophy of the septum. Systolic function was normal. The estimated ejection fraction was in the range of 60% to 65%. Wall motion was normal; there were no regional wall motion abnormalities. The study is not technically sufficient to allow evaluation of LV diastolic function. - Aortic valve: Trileaflet; mildly thickened, mildly calcified leaflets. - Mitral valve: Calcified annulus. - Pericardium, extracardiac: A small pericardial effusion was identified circumferential to the heart.  Ct Chest W Contrast  Result Date: 12/13/2015 CLINICAL DATA:  Lung cancer recurrence. EXAM: CT CHEST WITH CONTRAST TECHNIQUE: Multidetector CT imaging of the chest was performed during intravenous contrast administration. CONTRAST:  52m ISOVUE-300 IOPAMIDOL (ISOVUE-300) INJECTION 61% COMPARISON:  09/14/2015 FINDINGS: Cardiovascular: The heart size is normal. The heart size is normal. There is a new small pericardial effusion. Aortic atherosclerosis noted.  Calcification within the LAD coronary artery is identified. Mediastinum/Nodes: The trachea appears patent and is midline. Normal appearance of the esophagus. Progression of mediastinal adenopathy. Index right paratracheal lymph node measures 1.6 cm, image 34 of series 2. Previously 1.0 cm. Index pre-vascular lymph node measures 2.2 cm, image 43 of series 2. Previously 0.9 cm. New bilateral supraclavicular adenopathy. Index right supraclavicular node, image number 12 of series 2. Measures 1.6 cm index left supraclavicular lymph node measures 1.4 cm. Lungs/Pleura: Large central right perihilar mass is identified and is new from previous exam compatible with local tumor recurrence. The mass measures 9 x 10.9 by 8.2 cm, image 62 of series 2. There is narrowing of the right lower lobe, right middle lobe and right upper lobe airways. There is also significant mass effect upon the branches of the right pulmonary artery. New right upper lobe pulmonary nodule measures 9 mm, image number 49 of series 4. Perifissural nodule within the right middle lobe measures 11 mm, image number 60 of series 4. New from previous exam. There is a moderate to large right pleural effusion which is new from previous exam. Upper Abdomen: Multiple small stones identified within the gallbladder. The adrenal glands appear normal. No acute findings noted within the upper abdomen. Musculoskeletal: The bones appear diffusely osteopenic. Chronic fracture deformity involving the proximal right humerus noted. IMPRESSION: 1. Examination positive for recurrence of disease. 2. Compared with 09/14/2015 there has been marked progression of mediastinal and bilateral supraclavicular nodal metastases. 3. Large right perihilar lung mass is new from the previous exam. 4. New right pleural effusion with perifissural nodularity suspicious for pleural spread of tumor. 5. Small pericardial effusion is new from previous exam. Cannot rule out malignant pericardial  effusion. 6. Gallstones Electronically Signed   By: TKerby MoorsM.D.   On: 12/13/2015 16:36   Dg Chest Port 1 View  Result Date: 12/11/2015 CLINICAL DATA:  75year old female with a history of right-sided small cell carcinoma. Status post right-sided thoracentesis EXAM: PORTABLE CHEST 1 VIEW COMPARISON:  PET-CT 10/11/2015, chest x-ray 12/11/2015 1:16 a.m. FINDINGS: Cardiomediastinal silhouette unchanged. Dense opacity persists in the right hilar region. Calcifications of the aortic arch. Since the prior there is improved aeration on the right with improved visualization the right hemidiaphragm. No visualized pneumothorax. Trace thickening of the minor fissure persists. No evidence of left-sided pleural effusion. Unchanged position of right IJ port catheter. Right humeral fracture again noted, better seen on plain film of September 2017. IMPRESSION: Status post right-sided thoracentesis, with improved aeration and no visualized pneumothorax. Hilar opacity in this patient with known small cell carcinoma, may represent a combination of treatment effect, adenopathy, residual pleural fluid,  atelectasis/consolidation, or parenchymal involvement. Unchanged right IJ port catheter. Re- demonstration of known right humerus fracture. Signed, Dulcy Fanny. Earleen Newport, DO [facility] Electronically Signed   By: Corrie Mckusick D.O.   On: 12/11/2015 11:04   Dg Chest Port 1 View  Result Date: 12/11/2015 CLINICAL DATA:  Acute onset of shortness of breath. Recently diagnosed with bronchitis. Initial encounter. EXAM: PORTABLE CHEST 1 VIEW COMPARISON:  PET/CT performed 10/11/2015 FINDINGS: A moderate right-sided pleural effusion is noted, with right-sided airspace opacity. This may reflect pneumonia. Underlying recurrent malignancy is a concern. The left lung appears relatively clear.  No pneumothorax is seen. The cardiomediastinal silhouette is enlarged. Right perihilar  postradiation change is again noted. A right-sided chest port is noted ending about the mid SVC. No acute osseous abnormalities are seen. IMPRESSION: Moderate right-sided pleural effusion, with right-sided airspace opacity. This may reflect pneumonia. Underlying recurrent malignancy is a concern. Diagnostic thoracentesis could be considered for further evaluation, or PET/CT could be considered 3-4 weeks after completion of treatment for pneumonia. Electronically Signed   By: Garald Balding M.D.   On: 12/11/2015 01:35   US Thoracentesis Asp Pleural Space W/img Guide  Result Date: 12/11/2015 INDICATION: History of treated small cell lung cancer and recent diagnosis of bronchitis. Now admitted with dyspnea and a moderate right pleural effusion. Request is made for diagnostic and therapeutic thoracentesis. EXAM: ULTRASOUND GUIDED DIAGNOSTIC AND THERAPEUTIC THORACENTESIS MEDICATIONS: 1% lidocaine COMPLICATIONS: None immediate. PROCEDURE: An ultrasound guided thoracentesis was thoroughly discussed with the patient and questions answered. The benefits, risks, alternatives and complications were also discussed. The patient understands and wishes to proceed with the procedure. Written consent was obtained. Ultrasound was performed to localize and mark an adequate pocket of fluid in the right chest. The area was then prepped and draped in the normal sterile fashion. 1% Lidocaine was used for local anesthesia. Under ultrasound guidance a Safe-T-Centesis catheter was introduced. Thoracentesis was performed. The catheter was removed and a dressing applied. FINDINGS: A total of approximately 1 L of clear yellow fluid was removed. Samples were sent to the laboratory as requested by the clinical team. IMPRESSION: Successful ultrasound guided right thoracentesis yielding 1 L of pleural fluid. Read by: Saverio Danker, PA-C Electronically Signed   By: Jerilynn Mages.  Shick M.D.   On: 12/11/2015 10:48       Discharge Exam: Vitals:    12/15/15 0545 12/15/15 0924  BP: (!) 146/90   Pulse: (!) 101 (!) 105  Resp: 20 18  Temp: 98.1 F (36.7 C)    Vitals:   12/14/15 2132 12/14/15 2134 12/15/15 0545 12/15/15 0924  BP: (!) 154/72  (!) 146/90   Pulse: (!) 115  (!) 101 (!) 105  Resp: '20  20 18  '$ Temp: 98.3 F (36.8 C)  98.1 F (36.7 C)   TempSrc: Oral  Oral   SpO2: 96% 95% 97% 95%  Weight:      Height:        General: Pt is alert, awake, not in acute distress Cardiovascular: RRR, S1/S2 +, no rubs, no gallops Respiratory: CTA bilaterally, no wheezing, no rhonchi Abdominal: Soft, NT, ND, bowel sounds + Extremities: no edema, no cyanosis    The results of significant diagnostics from this hospitalization (including imaging, microbiology, ancillary and laboratory) are listed below for reference.     Microbiology: Recent Results (from the past 240 hour(s))  MRSA PCR Screening     Status: None   Collection Time: 12/11/15  3:49 AM  Result Value Ref Range Status   MRSA by PCR NEGATIVE NEGATIVE Final    Comment:        The GeneXpert MRSA Assay (FDA approved for NASAL specimens only), is one component of a comprehensive MRSA colonization surveillance program. It is not intended to diagnose MRSA infection nor to guide or monitor treatment for MRSA infections.   Culture, blood (x 2)     Status: None (Preliminary result)   Collection Time: 12/11/15  4:15 AM  Result Value Ref Range Status   Specimen Description BLOOD RIGHT FOREARM  Final   Special Requests IN PEDIATRIC BOTTLE 3CC  Final   Culture   Final    NO GROWTH 3 DAYS Performed at [facility]    Report Status PENDING  Incomplete  Culture, blood (x 2)     Status: None (Preliminary result)   Collection Time: 12/11/15  7:04 AM  Result Value Ref Range Status   Specimen Description BLOOD RIGHT ARM  Final   Special Requests BOTTLES DRAWN AEROBIC AND ANAEROBIC 5CC  Final   Culture   Final    NO GROWTH 3 DAYS Performed at [facility]     Report Status PENDING  Incomplete  Body fluid culture     Status: None   Collection Time: 12/11/15 10:49 AM  Result Value Ref Range Status   Specimen Description PLEURAL RIGHT  Final   Special Requests NONE  Final   Gram Stain   Final    FEW WBC PRESENT,BOTH PMN AND MONONUCLEAR NO ORGANISMS SEEN    Culture   Final    No growth aerobically or anaerobically. Performed at [facility]    Report Status 12/14/2015 FINAL  Final     Labs: BNP (last 3 results)  Recent Labs  12/11/15 0137  BNP 979.8*   Basic Metabolic Panel:  Recent Labs Lab 12/11/15 0137 12/11/15 0415 12/11/15 0704 12/14/15 0435  NA 134* 134* 132*  --   K 4.3 4.5 4.6  --   CL 100* 100* 100*  --   CO2 '27 26 25  '$ --   GLUCOSE 391* 450* 477*  --   BUN 28* 29* 26*  --   CREATININE 1.37* 1.36* 1.31* 1.40*  CALCIUM 8.5* 8.5* 7.9*  --    Liver Function Tests:  Recent Labs Lab 12/11/15 0137  AST 17  ALT 16  ALKPHOS 81  BILITOT 0.4  PROT 6.4*  ALBUMIN 3.4*   No results for input(s): LIPASE, AMYLASE in the last 168 hours. No results for input(s): AMMONIA in the last 168 hours. CBC:  Recent Labs Lab 12/11/15 0137 12/11/15 0415  WBC 10.6* 10.8*  NEUTROABS 9.2*  --   HGB 10.0* 9.6*  HCT 30.9* 29.9*  MCV 101.6* 101.4*  PLT 157 154   Cardiac Enzymes: No results for input(s): CKTOTAL, CKMB, CKMBINDEX, TROPONINI in the last 168 hours. BNP: Invalid input(s): POCBNP CBG:  Recent Labs Lab 12/14/15 1153 12/14/15 1618 12/14/15 2131 12/15/15 0822 12/15/15 1139  GLUCAP 220* 407* 358* 121* 101*   D-Dimer No results for input(s): DDIMER in the last 72 hours. Hgb A1c No results for input(s): HGBA1C in the last 72 hours. Lipid Profile No results for input(s): CHOL, HDL, LDLCALC, TRIG, CHOLHDL, LDLDIRECT in the last 72 hours. Thyroid function studies No results for input(s): TSH, T4TOTAL, T3FREE, THYROIDAB in the last 72 hours.  Invalid input(s): FREET3 Anemia work up No results for  input(s): VITAMINB12, FOLATE, FERRITIN, TIBC, IRON, RETICCTPCT in the  last 72 hours. Urinalysis    Component Value Date/Time   COLORURINE YELLOW 11/25/2014 [place] 11/25/2014 1526   LABSPEC 1.006 11/25/2014 1526   PHURINE 6.5 11/25/2014 1526   GLUCOSEU NEGATIVE 11/25/2014 1526   HGBUR NEGATIVE 11/25/2014 [place] 11/25/2014 [place] 11/25/2014 1526   PROTEINUR NEGATIVE 11/25/2014 1526   UROBILINOGEN 0.2 11/25/2014 1526   NITRITE NEGATIVE 11/25/2014 1526   LEUKOCYTESUR TRACE (A) 11/25/2014 1526   Sepsis Labs Invalid input(s): PROCALCITONIN,  WBC,  LACTICIDVEN Microbiology Recent Results (from the past 240 hour(s))  MRSA PCR Screening     Status: None   Collection Time: 12/11/15  3:49 AM  Result Value Ref Range Status   MRSA by PCR NEGATIVE NEGATIVE Final    Comment:        The GeneXpert MRSA Assay (FDA approved for NASAL specimens only), is one component of a comprehensive MRSA colonization surveillance program. It is not intended to diagnose MRSA infection nor to guide or monitor treatment for MRSA infections.   Culture, blood (x 2)     Status: None (Preliminary result)   Collection Time: 12/11/15  4:15 AM  Result Value Ref Range Status   Specimen Description BLOOD RIGHT FOREARM  Final   Special Requests IN PEDIATRIC BOTTLE 3CC  Final   Culture   Final    NO GROWTH 3 DAYS Performed at [facility]    Report Status PENDING  Incomplete  Culture, blood (x 2)     Status: None (Preliminary result)   Collection Time: 12/11/15  7:04 AM  Result Value Ref Range Status   Specimen Description BLOOD RIGHT ARM  Final   Special Requests BOTTLES DRAWN AEROBIC AND ANAEROBIC 5CC  Final   Culture   Final    NO GROWTH 3 DAYS Performed at [facility]    Report Status PENDING  Incomplete  Body fluid culture     Status: None   Collection Time: 12/11/15 10:49 AM  Result Value Ref Range Status   Specimen  Description PLEURAL RIGHT  Final   Special Requests NONE  Final   Gram Stain   Final    FEW WBC PRESENT,BOTH PMN AND MONONUCLEAR NO ORGANISMS SEEN    Culture   Final    No growth aerobically or anaerobically. Performed at [facility]    Report Status 12/14/2015 FINAL  Final     Time coordinating discharge: Over 30 minutes  SIGNED:   Debbe Odea, MD  [facility] 12/15/2015, 1:26 PM Pager   If 7PM-7AM, please contact night-coverage [email or web address] Password [email or web address]

## 2015-12-15 NOTE — Progress Notes (Signed)
SATURATION QUALIFICATIONS: (This note is used to comply with regulatory documentation for home oxygen)  Patient Saturations on Room Air at Rest = 95%  Patient Saturations on Room Air while Ambulating = 88%  Patient Saturations on 2 Liters of oxygen while Ambulating = 92%  Please briefly explain why patient needs home oxygen: inadequate oxygenation without supplemental O2

## 2015-12-15 NOTE — Discharge Instructions (Signed)
Continue your sling for the right arm fracture and follow up with Orthopedic surgery. Psychiatry recommended you cut back Celexa from 20 mg to 10 mg and then stop is completely in 2 wks while increasing Remeron to 30 mg at the same time. Please discuss this with your PCP and ask for a referral to a psychiatrist.   Please take all your medications with you for your next visit with your Primary MD. Please request your Primary MD to go over all hospital test results at the follow up. Please ask your Primary MD to get all Hospital records sent to his/her office.  If you experience worsening of your admission symptoms, develop shortness of breath, chest pain, suicidal or homicidal thoughts or a life threatening emergency, you must seek medical attention immediately by calling 911 or calling your MD.  Dennis Bast must read the complete instructions/literature along with all the possible adverse reactions/side effects for all the medicines you take including new medications that have been prescribed to you. Take new medicines after you have completely understood and accpet all the possible adverse reactions/side effects.   Do not drive when taking pain medications or sedatives.    Do not take more than prescribed Pain, Sleep and Anxiety Medications  If you have smoked or chewed Tobacco in the last 2 yrs please stop. Stop any regular alcohol and or recreational drug use.  Wear Seat belts while driving.

## 2015-12-16 LAB — CULTURE, BLOOD (ROUTINE X 2)
CULTURE: NO GROWTH
Culture: NO GROWTH

## 2015-12-16 LAB — CBC
HCT: 31.7 % — ABNORMAL LOW (ref 36.0–46.0)
Hemoglobin: 10.3 g/dL — ABNORMAL LOW (ref 12.0–15.0)
MCH: 32.7 pg (ref 26.0–34.0)
MCHC: 32.5 g/dL (ref 30.0–36.0)
MCV: 100.6 fL — AB (ref 78.0–100.0)
PLATELETS: 186 10*3/uL (ref 150–400)
RBC: 3.15 MIL/uL — ABNORMAL LOW (ref 3.87–5.11)
RDW: 15.6 % — AB (ref 11.5–15.5)
WBC: 12 10*3/uL — AB (ref 4.0–10.5)

## 2015-12-16 LAB — BASIC METABOLIC PANEL
Anion gap: 7 (ref 5–15)
BUN: 38 mg/dL — AB (ref 6–20)
CHLORIDE: 101 mmol/L (ref 101–111)
CO2: 30 mmol/L (ref 22–32)
CREATININE: 0.97 mg/dL (ref 0.44–1.00)
Calcium: 8.4 mg/dL — ABNORMAL LOW (ref 8.9–10.3)
GFR calc Af Amer: >60 mL/min (ref >60)
GFR calc non Af Amer: 55 mL/min — ABNORMAL LOW (ref >60)
Glucose, Bld: 128 mg/dL — ABNORMAL HIGH (ref 65–99)
Potassium: 4.2 mmol/L (ref 3.5–5.1)
SODIUM: 138 mmol/L (ref 135–145)

## 2015-12-16 LAB — GLUCOSE, CAPILLARY
Glucose-Capillary: 112 mg/dL — ABNORMAL HIGH (ref 65–99)
Glucose-Capillary: 257 mg/dL — ABNORMAL HIGH (ref 65–99)

## 2015-12-16 LAB — HEMOGLOBIN A1C
HEMOGLOBIN A1C: 6.7 % — AB (ref 4.8–5.6)
MEAN PLASMA GLUCOSE: 146 mg/dL

## 2015-12-16 MED ORDER — PREDNISONE 10 MG PO TABS: 40.0000 mg | ORAL_TABLET | Freq: Every day | ORAL | 0 refills | Status: DC

## 2015-12-16 MED ORDER — LINAGLIPTIN 5 MG PO TABS
5.0000 mg | ORAL_TABLET | Freq: Every day | ORAL | Status: DC
  Administered 2015-12-16: 5 mg via ORAL
  Filled 2015-12-16: qty 1

## 2015-12-16 MED ORDER — HEPARIN SOD (PORK) LOCK FLUSH 100 UNIT/ML IV SOLN
500.0000 [IU] | Freq: Once | INTRAVENOUS | Status: AC
  Administered 2015-12-16: 500 [IU] via INTRAVENOUS
  Filled 2015-12-16: qty 5

## 2015-12-16 MED ORDER — FAMOTIDINE IN NACL 20-0.9 MG/50ML-% IV SOLN
20.0000 mg | Freq: Once | INTRAVENOUS | Status: AC
  Administered 2015-12-16: 20 mg via INTRAVENOUS
  Filled 2015-12-16: qty 50

## 2015-12-16 MED ORDER — DIPHENHYDRAMINE HCL 50 MG/ML IJ SOLN
25.0000 mg | Freq: Once | INTRAMUSCULAR | Status: AC
  Administered 2015-12-16: 25 mg via INTRAVENOUS
  Filled 2015-12-16: qty 1

## 2015-12-16 MED ORDER — ALBUTEROL SULFATE HFA 108 (90 BASE) MCG/ACT IN AERS: 2.0000 | INHALATION_SPRAY | Freq: Four times a day (QID) | RESPIRATORY_TRACT | 3 refills | Status: AC | PRN

## 2015-12-16 NOTE — Progress Notes (Signed)
[facility]  Stable for d/c home today. Please see my d/c summary from 11/3.   Debbe Odea, MD

## 2015-12-16 NOTE — Progress Notes (Signed)
Patient discharged to home with family, discharge instructions reviewed with patient and son who verbalized understanding. New RX sent with patient.

## 2015-12-16 NOTE — Clinical Social Work Note (Signed)
CSW spoke multiple times re: patient. She is walking well with PT and does not meet criteria for d/c to SNF.  MD will d/c patient today to home with family.  SW received call from unit RN that patient's family is all "out of town" and cannot come and get her.  CSW spoke with pt's daughter who questions why patient could not be placed in a nursing home.  Above discussed and she verbalized understanding.  Family will arrange pick up of patient around 2pm. Notified RN and Dr. Wynelle Cleveland.  RNCM indicated that 02 is in place for d/c.  No further CSW services indicated and will sign off.  Lorie Phenix. Pauline Good, [place]

## 2015-12-16 NOTE — Evaluation (Signed)
Physical Therapy Evaluation Patient Details Name: Rachel Chapman MRN: [id number] DOB: 1940-03-08 Today's Date: 12/16/2015   History of Present Illness  Pt admitted with SOB and dx with R pleural effusion and resp failure.  Pt with hx of DM. COPD, and Lung CA  Clinical Impression  Pt admitted as above and presenting with functional mobility limitations 2* generalized weakness and ambulatory balance deficits.  Pt would benefit from short term SNF level rehab to maximize safety and IND prior to return home with ltd assist.    Follow Up Recommendations SNF    Equipment Recommendations  Rolling walker with 5" wheels    Recommendations for Other Services OT consult     Precautions / Restrictions Precautions Precautions: Fall Precaution Comments: Pt reports dizziness and hx of fall at home Restrictions Weight Bearing Restrictions: No      Mobility  Bed Mobility Overal bed mobility: Modified Independent Bed Mobility: Supine to Sit           General bed mobility comments: Increased time but manouvered to EOB unassisted  Transfers Overall transfer level: Needs assistance Equipment used: None Transfers: Sit to/from Stand Sit to Stand: Min guard         General transfer comment: cues for safe transition position with min guard to steady with initial standing  Ambulation/Gait Ambulation/Gait assistance: Min assist Ambulation Distance (Feet): 200 Feet Assistive device: Rolling walker (2 wheeled);None Gait Pattern/deviations: Step-through pattern;Decreased step length - right;Decreased step length - left;Shuffle;Trunk flexed Gait velocity: decr Gait velocity interpretation: Below normal speed for age/gender General Gait Details: Pt requiring min HHA for balance with ambulation.  Largely corrected with use of RW.  Multiple short standing rests 2* SOB on 3L  Stairs            Wheelchair Mobility    Modified Rankin (Stroke Patients Only)       Balance  Overall balance assessment: Needs assistance Sitting-balance support: Feet supported;No upper extremity supported Sitting balance-Leahy Scale: Good     Standing balance support: No upper extremity supported Standing balance-Leahy Scale: Fair                               Pertinent Vitals/Pain Pain Assessment: No/denies pain    Home Living Family/patient expects to be discharged to:: Private residence Living Arrangements: Children Available Help at Discharge: Family;Available PRN/intermittently Type of Home: House                Prior Function Level of Independence: Independent               Hand Dominance   Dominant Hand: Right    Extremity/Trunk Assessment   Upper Extremity Assessment: Generalized weakness           Lower Extremity Assessment: Generalized weakness         Communication   Communication: No difficulties  Cognition Arousal/Alertness: Awake/alert Behavior During Therapy: WFL for tasks assessed/performed Overall Cognitive Status: Within Functional Limits for tasks assessed                      General Comments      Exercises     Assessment/Plan    PT Assessment Patient needs continued PT services  PT Problem List Decreased strength;Decreased range of motion;Decreased activity tolerance;Decreased balance;Decreased mobility;Decreased knowledge of use of DME          PT Treatment Interventions DME instruction;Gait training;Stair training;Functional mobility training;Therapeutic activities;Therapeutic  exercise;Patient/family education    PT Goals (Current goals can be found in the Care Plan section)  Acute Rehab PT Goals Patient Stated Goal: Regain strength to be safe with mobility when alone at home PT Goal Formulation: With patient Time For Goal Achievement: 01/14/2016 Potential to Achieve Goals: Fair    Frequency 7X/week   Barriers to discharge        Co-evaluation               End of  Session Equipment Utilized During Treatment: Gait belt;Oxygen Activity Tolerance: Patient tolerated treatment well;Patient limited by fatigue Patient left: in chair;with call bell/phone within reach;with chair alarm set Nurse Communication: Mobility status         Time: 2003-7944 PT Time Calculation (min) (ACUTE ONLY): 32 min   Charges:   PT Evaluation $PT Eval Low Complexity: 1 Procedure PT Treatments $Gait Training: 8-22 mins   PT G Codes:        Srinika Delone 2015/12/31, 1:12 PM

## 2015-12-16 NOTE — Progress Notes (Signed)
MEDICATION RELATED CONSULT NOTE   IR Procedure Consult - Anticoagulant/Antiplatelet PTA/Inpatient Med List Review by Pharmacist    Procedure: US guided biopsy of right supraclavicular lymph node    Completed: 12/15/15 at 1528  Post-Procedural bleeding risk per IR MD assessment: Low  Antithrombotic medications on inpatient or PTA profile prior to procedure:  Enoxaparin '40mg'$  SQ q24h    Recommended restart time per IR Post-Procedure Guidelines:  Day 0 (at least 4 hours post-procedure or at next standard dose interval)   Plan:  Enoxaparin given at next standard dose interval on 11/3 PM-timing appropriate.   Lindell Spar, PharmD, BCPS Pager: [phone] 12/16/2015 12:00 PM

## 2015-12-17 NOTE — Care Management Note (Addendum)
Case Management Note  Patient Details  Name: Rachel Chapman MRN: [id number] Date of Birth: 12-04-1940  Subjective/Objective:     Small cell lung cancer,   SIRS, COPD             Action/Plan: Discharge Planning: AVS reviewed:  Chart reviewed. Please see previous NCM notes. [place] to make aware of dc home on 12/16/2015 with resumption of care. Faxed orders, facesheet, F2F, and dc summary to agency. Spoke to [place], on Warehouse manager and she will notify office that pt dc. Spoke to [facility] DME rep and oxygen was delivered to room prior to dc. [facility] will deliver concentrator to home once pt arrives.   12/17/2015 0945 Contacted son, Nehemiah Montee, to follow up on oxygen delivery. Left HIPAA compliant voice message for return call.   PCP- [name] MD   Expected Discharge Date:  12/17/2015               Expected Discharge Plan:  Home/Self Care  In-House Referral:  NA  Discharge planning Services  CM Consult  Post Acute Care Choice:  Home Health Choice offered to:  Adult Children  DME Arranged:  Oxygen DME Agency:  [place] [place] Arranged:  RN, PT, OT, Nurse's Aide, Social Work [employer] Agency:  [place]  Status of Service:  Completed, signed off  If discussed at [employer], dates discussed:    Additional Comments:  Erenest Rasher, RN 12/17/2015, 9:41 AM

## 2015-12-18 ENCOUNTER — Telehealth: Payer: Self-pay

## 2015-12-18 ENCOUNTER — Telehealth: Payer: Self-pay | Admitting: Hematology

## 2015-12-18 NOTE — Telephone Encounter (Signed)
Spoke with pt's son and reviewed all TCM information. He states that he feels pt needs to be in rehab facility as she is not doing well at home. Pt cannot go to the bathroom by herself, rarely gets out of bed or participates in any activities. Pt states that she has "forgotten how to walk" and that she does not want to be out of bed. Pt's son is very concerned about depression and a recurrence of her lung problems with lack of mobility. He would like to see about having her placed inpatient rehab so that they can work on her mobility and strength conditioning. Pt is on 2L continuous and is still complaining of "not being able to breathe". Advised son to purchase pulse ox and increase O2 flow as needed to keep sats >90%. He voiced understanding.   Appt scheduled with [facility] 12/16/15 @ 11am. Pt's son aware.   Transition Care Management Follow-up Telephone Call  How have you been since you were released from the hospital? ShOB, decreased activity, unable to check  O2 sats, on O2/2L, possible depression   Do you understand why you were in the hospital? yes   Do you understand the discharge instrcutions? yes  Items Reviewed:  Medications reviewed: yes  Allergies reviewed: yes  Dietary changes reviewed: yes  Referrals reviewed: yes   Functional Questionnaire:   Activities of Daily Living (ADLs):   She states they are independent in the following: feeding and grooming States they require assistance with the following: ambulation, continence, toileting and dressing   Any transportation issues/concerns?: no   Any patient concerns? Yes -    Confirmed importance and date/time of follow-up visits scheduled: yes   Confirmed with patient if condition begins to worsen call PCP or go to the ER.  Patient was given the Call-a-Nurse line [phone]: yes

## 2015-12-18 NOTE — Telephone Encounter (Signed)
Spoke with caregiver to inform pt of 11/9 appt date/time per LOS

## 2015-12-19 ENCOUNTER — Ambulatory Visit (INDEPENDENT_AMBULATORY_CARE_PROVIDER_SITE_OTHER): Payer: Medicare Other | Admitting: Adult Health

## 2015-12-19 ENCOUNTER — Encounter: Payer: Self-pay | Admitting: Adult Health

## 2015-12-19 VITALS — BP 90/50 | HR 106 | Temp 98.4°F | Resp 24 | Ht 62.0 in | Wt 157.0 lb

## 2015-12-19 DIAGNOSIS — F418 Other specified anxiety disorders: Secondary | ICD-10-CM

## 2015-12-19 DIAGNOSIS — E1121 Type 2 diabetes mellitus with diabetic nephropathy: Secondary | ICD-10-CM

## 2015-12-19 DIAGNOSIS — F419 Anxiety disorder, unspecified: Secondary | ICD-10-CM

## 2015-12-19 DIAGNOSIS — Z5189 Encounter for other specified aftercare: Secondary | ICD-10-CM

## 2015-12-19 DIAGNOSIS — C3491 Malignant neoplasm of unspecified part of right bronchus or lung: Secondary | ICD-10-CM | POA: Diagnosis not present

## 2015-12-19 DIAGNOSIS — F329 Major depressive disorder, single episode, unspecified: Secondary | ICD-10-CM

## 2015-12-19 NOTE — Progress Notes (Signed)
Pre visit review using our clinic review tool, if applicable. No additional management support is needed unless otherwise documented below in the visit note.  Pt presented to office with oxygen on at 2 L/min via nasal cannula.

## 2015-12-20 ENCOUNTER — Emergency Department (HOSPITAL_COMMUNITY): Payer: Medicare Other

## 2015-12-20 ENCOUNTER — Telehealth: Payer: Self-pay | Admitting: Adult Health

## 2015-12-20 ENCOUNTER — Encounter (HOSPITAL_COMMUNITY): Payer: Self-pay | Admitting: Emergency Medicine

## 2015-12-20 ENCOUNTER — Inpatient Hospital Stay (HOSPITAL_COMMUNITY)
Admission: EM | Admit: 2015-12-20 | Discharge: 2015-12-26 | DRG: 189 | Disposition: A | Discharge status: Hospice | Payer: Medicare Other | Attending: Internal Medicine | Admitting: Internal Medicine

## 2015-12-20 DIAGNOSIS — Z09 Encounter for follow-up examination after completed treatment for conditions other than malignant neoplasm: Secondary | ICD-10-CM

## 2015-12-20 DIAGNOSIS — J9601 Acute respiratory failure with hypoxia: Secondary | ICD-10-CM | POA: Diagnosis present

## 2015-12-20 DIAGNOSIS — Z923 Personal history of irradiation: Secondary | ICD-10-CM

## 2015-12-20 DIAGNOSIS — J209 Acute bronchitis, unspecified: Secondary | ICD-10-CM

## 2015-12-20 DIAGNOSIS — Z87891 Personal history of nicotine dependence: Secondary | ICD-10-CM | POA: Diagnosis not present

## 2015-12-20 DIAGNOSIS — Z85118 Personal history of other malignant neoplasm of bronchus and lung: Secondary | ICD-10-CM

## 2015-12-20 DIAGNOSIS — J910 Malignant pleural effusion: Secondary | ICD-10-CM

## 2015-12-20 DIAGNOSIS — R000 Tachycardia, unspecified: Secondary | ICD-10-CM | POA: Diagnosis not present

## 2015-12-20 DIAGNOSIS — F419 Anxiety disorder, unspecified: Secondary | ICD-10-CM | POA: Diagnosis present

## 2015-12-20 DIAGNOSIS — E785 Hyperlipidemia, unspecified: Secondary | ICD-10-CM | POA: Diagnosis present

## 2015-12-20 DIAGNOSIS — Z23 Encounter for immunization: Secondary | ICD-10-CM

## 2015-12-20 DIAGNOSIS — N179 Acute kidney failure, unspecified: Secondary | ICD-10-CM | POA: Diagnosis present

## 2015-12-20 DIAGNOSIS — Z515 Encounter for palliative care: Secondary | ICD-10-CM

## 2015-12-20 DIAGNOSIS — R0602 Shortness of breath: Secondary | ICD-10-CM | POA: Diagnosis not present

## 2015-12-20 DIAGNOSIS — Z9221 Personal history of antineoplastic chemotherapy: Secondary | ICD-10-CM

## 2015-12-20 DIAGNOSIS — R0600 Dyspnea, unspecified: Secondary | ICD-10-CM

## 2015-12-20 DIAGNOSIS — T40605A Adverse effect of unspecified narcotics, initial encounter: Secondary | ICD-10-CM | POA: Diagnosis not present

## 2015-12-20 DIAGNOSIS — Z66 Do not resuscitate: Secondary | ICD-10-CM | POA: Diagnosis present

## 2015-12-20 DIAGNOSIS — I959 Hypotension, unspecified: Secondary | ICD-10-CM | POA: Diagnosis not present

## 2015-12-20 DIAGNOSIS — Z809 Family history of malignant neoplasm, unspecified: Secondary | ICD-10-CM

## 2015-12-20 DIAGNOSIS — Z6828 Body mass index (BMI) 28.0-28.9, adult: Secondary | ICD-10-CM

## 2015-12-20 DIAGNOSIS — D539 Nutritional anemia, unspecified: Secondary | ICD-10-CM | POA: Diagnosis present

## 2015-12-20 DIAGNOSIS — Z9981 Dependence on supplemental oxygen: Secondary | ICD-10-CM | POA: Diagnosis not present

## 2015-12-20 DIAGNOSIS — E039 Hypothyroidism, unspecified: Secondary | ICD-10-CM | POA: Diagnosis present

## 2015-12-20 DIAGNOSIS — J90 Pleural effusion, not elsewhere classified: Secondary | ICD-10-CM

## 2015-12-20 DIAGNOSIS — Z7189 Other specified counseling: Secondary | ICD-10-CM

## 2015-12-20 DIAGNOSIS — Z833 Family history of diabetes mellitus: Secondary | ICD-10-CM

## 2015-12-20 DIAGNOSIS — Z9889 Other specified postprocedural states: Secondary | ICD-10-CM

## 2015-12-20 DIAGNOSIS — I129 Hypertensive chronic kidney disease with stage 1 through stage 4 chronic kidney disease, or unspecified chronic kidney disease: Secondary | ICD-10-CM | POA: Diagnosis present

## 2015-12-20 DIAGNOSIS — N183 Chronic kidney disease, stage 3 (moderate): Secondary | ICD-10-CM | POA: Diagnosis present

## 2015-12-20 DIAGNOSIS — C3491 Malignant neoplasm of unspecified part of right bronchus or lung: Secondary | ICD-10-CM | POA: Diagnosis present

## 2015-12-20 DIAGNOSIS — E43 Unspecified severe protein-calorie malnutrition: Secondary | ICD-10-CM | POA: Diagnosis not present

## 2015-12-20 DIAGNOSIS — C770 Secondary and unspecified malignant neoplasm of lymph nodes of head, face and neck: Secondary | ICD-10-CM | POA: Diagnosis not present

## 2015-12-20 DIAGNOSIS — C3490 Malignant neoplasm of unspecified part of unspecified bronchus or lung: Secondary | ICD-10-CM | POA: Diagnosis not present

## 2015-12-20 DIAGNOSIS — K5900 Constipation, unspecified: Secondary | ICD-10-CM | POA: Diagnosis present

## 2015-12-20 DIAGNOSIS — C779 Secondary and unspecified malignant neoplasm of lymph node, unspecified: Secondary | ICD-10-CM | POA: Diagnosis present

## 2015-12-20 DIAGNOSIS — D649 Anemia, unspecified: Secondary | ICD-10-CM | POA: Diagnosis present

## 2015-12-20 DIAGNOSIS — E1122 Type 2 diabetes mellitus with diabetic chronic kidney disease: Secondary | ICD-10-CM | POA: Diagnosis present

## 2015-12-20 DIAGNOSIS — L8990 Pressure ulcer of unspecified site, unspecified stage: Secondary | ICD-10-CM | POA: Insufficient documentation

## 2015-12-20 LAB — BODY FLUID CELL COUNT WITH DIFFERENTIAL
EOS FL: 0 %
LYMPHS FL: 67 %
MONOCYTE-MACROPHAGE-SEROUS FLUID: 22 % — AB (ref 50–90)
Neutrophil Count, Fluid: 11 % (ref 0–25)
WBC FLUID: 179 uL (ref 0–1000)

## 2015-12-20 LAB — BLOOD GAS, ARTERIAL
ACID-BASE EXCESS: 6.9 mmol/L — AB (ref 0.0–2.0)
BICARBONATE: 33.2 mmol/L — AB (ref 20.0–28.0)
Delivery systems: POSITIVE
Drawn by: 103701
EXPIRATORY PAP: 5
FIO2: 0.4
INSPIRATORY PAP: 10
LHR: 8 {breaths}/min
Mode: POSITIVE
O2 SAT: 98.5 %
PATIENT TEMPERATURE: 98.6
PCO2 ART: 59.8 mmHg — AB (ref 32.0–48.0)
PH ART: 7.363 (ref 7.350–7.450)
pO2, Arterial: 112 mmHg — ABNORMAL HIGH (ref 83.0–108.0)

## 2015-12-20 LAB — CBC WITH DIFFERENTIAL/PLATELET
BASOS ABS: 0 10*3/uL (ref 0.0–0.1)
Basophils Relative: 0 %
Eosinophils Absolute: 0 10*3/uL (ref 0.0–0.7)
Eosinophils Relative: 0 %
HEMATOCRIT: 32.5 % — AB (ref 36.0–46.0)
Hemoglobin: 10.4 g/dL — ABNORMAL LOW (ref 12.0–15.0)
LYMPHS PCT: 6 %
Lymphs Abs: 0.5 10*3/uL — ABNORMAL LOW (ref 0.7–4.0)
MCH: 32.6 pg (ref 26.0–34.0)
MCHC: 32 g/dL (ref 30.0–36.0)
MCV: 101.9 fL — AB (ref 78.0–100.0)
Monocytes Absolute: 0.5 10*3/uL (ref 0.1–1.0)
Monocytes Relative: 6 %
NEUTROS ABS: 7.7 10*3/uL (ref 1.7–7.7)
NEUTROS PCT: 88 %
PLATELETS: 157 10*3/uL (ref 150–400)
RBC: 3.19 MIL/uL — AB (ref 3.87–5.11)
RDW: 15.3 % (ref 11.5–15.5)
WBC: 8.7 10*3/uL (ref 4.0–10.5)

## 2015-12-20 LAB — COMPREHENSIVE METABOLIC PANEL
ALT: 28 U/L (ref 14–54)
AST: 14 U/L — AB (ref 15–41)
Albumin: 3.4 g/dL — ABNORMAL LOW (ref 3.5–5.0)
Alkaline Phosphatase: 63 U/L (ref 38–126)
Anion gap: 3 — ABNORMAL LOW (ref 5–15)
BILIRUBIN TOTAL: 0.6 mg/dL (ref 0.3–1.2)
BUN: 22 mg/dL — AB (ref 6–20)
CHLORIDE: 101 mmol/L (ref 101–111)
CO2: 34 mmol/L — ABNORMAL HIGH (ref 22–32)
CREATININE: 0.83 mg/dL (ref 0.44–1.00)
Calcium: 8.5 mg/dL — ABNORMAL LOW (ref 8.9–10.3)
GFR calc Af Amer: >60 mL/min (ref >60)
Glucose, Bld: 341 mg/dL — ABNORMAL HIGH (ref 65–99)
Potassium: 4.7 mmol/L (ref 3.5–5.1)
Sodium: 138 mmol/L (ref 135–145)
Total Protein: 6.1 g/dL — ABNORMAL LOW (ref 6.5–8.1)

## 2015-12-20 LAB — I-STAT CG4 LACTIC ACID, ED: Lactic Acid, Venous: 0.83 mmol/L (ref 0.5–1.9)

## 2015-12-20 LAB — ALBUMIN, FLUID (OTHER): Albumin, Fluid: 1.6 g/dL

## 2015-12-20 LAB — I-STAT TROPONIN, ED: TROPONIN I, POC: 0.01 ng/mL (ref 0.00–0.08)

## 2015-12-20 LAB — CBG MONITORING, ED: GLUCOSE-CAPILLARY: 345 mg/dL — AB (ref 65–99)

## 2015-12-20 LAB — BRAIN NATRIURETIC PEPTIDE: B Natriuretic Peptide: 231.5 pg/mL — ABNORMAL HIGH (ref 0.0–100.0)

## 2015-12-20 LAB — PROTEIN, BODY FLUID: Total protein, fluid: <3 g/dL

## 2015-12-20 MED ORDER — ALBUTEROL SULFATE (2.5 MG/3ML) 0.083% IN NEBU
5.0000 mg | INHALATION_SOLUTION | Freq: Once | RESPIRATORY_TRACT | Status: AC
  Administered 2015-12-20: 5 mg via RESPIRATORY_TRACT
  Filled 2015-12-20: qty 6

## 2015-12-20 MED ORDER — CITALOPRAM HYDROBROMIDE 20 MG PO TABS
10.0000 mg | ORAL_TABLET | Freq: Every day | ORAL | Status: DC
  Administered 2015-12-21 – 2015-12-26 (×6): 10 mg via ORAL
  Filled 2015-12-20 (×6): qty 1

## 2015-12-20 MED ORDER — ENOXAPARIN SODIUM 40 MG/0.4ML ~~LOC~~ SOLN
40.0000 mg | SUBCUTANEOUS | Status: DC
  Administered 2015-12-20 – 2015-12-21 (×2): 40 mg via SUBCUTANEOUS
  Filled 2015-12-20 (×2): qty 0.4

## 2015-12-20 MED ORDER — HYDROCODONE-ACETAMINOPHEN 5-325 MG PO TABS
1.0000 | ORAL_TABLET | Freq: Four times a day (QID) | ORAL | Status: DC | PRN
  Administered 2015-12-20: 1 via ORAL
  Administered 2015-12-21 (×2): 2 via ORAL
  Administered 2015-12-22: 1 via ORAL
  Administered 2015-12-23: 2 via ORAL
  Administered 2015-12-23: 1 via ORAL
  Administered 2015-12-24 (×2): 2 via ORAL
  Administered 2015-12-24 – 2015-12-25 (×2): 1 via ORAL
  Administered 2015-12-25 – 2015-12-26 (×2): 2 via ORAL
  Filled 2015-12-20 (×6): qty 2
  Filled 2015-12-20 (×4): qty 1
  Filled 2015-12-20 (×3): qty 2

## 2015-12-20 MED ORDER — INSULIN ASPART 100 UNIT/ML ~~LOC~~ SOLN
0.0000 [IU] | Freq: Every day | SUBCUTANEOUS | Status: DC
  Administered 2015-12-20: 4 [IU] via SUBCUTANEOUS
  Administered 2015-12-21: 2 [IU] via SUBCUTANEOUS
  Filled 2015-12-20: qty 1

## 2015-12-20 MED ORDER — VITAMIN B-12 1000 MCG PO TABS
1000.0000 ug | ORAL_TABLET | Freq: Every day | ORAL | Status: DC
  Administered 2015-12-21 – 2015-12-22 (×2): 1000 ug via ORAL
  Filled 2015-12-20 (×2): qty 1

## 2015-12-20 MED ORDER — MIRTAZAPINE 30 MG PO TABS: 30.0000 mg | ORAL_TABLET | Freq: Every day | ORAL | 1 refills | Status: AC

## 2015-12-20 MED ORDER — INFLUENZA VAC SPLIT QUAD 0.5 ML IM SUSY
0.5000 mL | PREFILLED_SYRINGE | INTRAMUSCULAR | Status: AC
  Administered 2015-12-21: 0.5 mL via INTRAMUSCULAR
  Filled 2015-12-20: qty 0.5

## 2015-12-20 MED ORDER — PREDNISONE 20 MG PO TABS
40.0000 mg | ORAL_TABLET | Freq: Every day | ORAL | Status: AC
  Administered 2015-12-21 – 2015-12-23 (×3): 40 mg via ORAL
  Filled 2015-12-20 (×3): qty 2

## 2015-12-20 MED ORDER — ALBUTEROL SULFATE HFA 108 (90 BASE) MCG/ACT IN AERS: 2.0000 | INHALATION_SPRAY | Freq: Four times a day (QID) | RESPIRATORY_TRACT | Status: DC | PRN

## 2015-12-20 MED ORDER — INSULIN ASPART 100 UNIT/ML ~~LOC~~ SOLN
0.0000 [IU] | Freq: Three times a day (TID) | SUBCUTANEOUS | Status: DC
  Administered 2015-12-21: 5 [IU] via SUBCUTANEOUS

## 2015-12-20 MED ORDER — MIRTAZAPINE 30 MG PO TABS
30.0000 mg | ORAL_TABLET | Freq: Every day | ORAL | Status: DC
  Administered 2015-12-20 – 2015-12-25 (×6): 30 mg via ORAL
  Filled 2015-12-20 (×4): qty 1
  Filled 2015-12-20: qty 2
  Filled 2015-12-20 (×2): qty 1

## 2015-12-20 MED ORDER — LEVOTHYROXINE SODIUM 112 MCG PO TABS
112.0000 ug | ORAL_TABLET | ORAL | Status: DC
  Administered 2015-12-20 – 2015-12-26 (×11): 112 ug via ORAL
  Filled 2015-12-20 (×14): qty 1

## 2015-12-20 MED ORDER — DM-GUAIFENESIN ER 30-600 MG PO TB12
1.0000 | ORAL_TABLET | Freq: Two times a day (BID) | ORAL | Status: DC | PRN
  Filled 2015-12-20: qty 1

## 2015-12-20 MED ORDER — ALBUTEROL SULFATE (2.5 MG/3ML) 0.083% IN NEBU
2.5000 mg | INHALATION_SOLUTION | Freq: Four times a day (QID) | RESPIRATORY_TRACT | Status: DC | PRN
  Administered 2015-12-21 (×2): 2.5 mg via RESPIRATORY_TRACT
  Filled 2015-12-20 (×2): qty 3

## 2015-12-20 NOTE — H&P (Signed)
History and Physical    Rachel Chapman TIR:[id number] DOB: Jul 07, 1940 DOA: 12/20/2015  PCP: [name], NP  Patient coming from: [place].  Chief Complaint: dyspnea at rest.   HPI: Rachel Chapman is a 75 y.o. female with medical history significant of SCLC s/p radiation and chemo, COPD, DM type 2, HTN, HLD, hypothyroidism, anemia, and anxiety, recently discharged from the hospital on 11/3 after being treated for right pleural effusion/ hcap, presents today for worsening sob. She had a  recent PET 10/11/15 showing new mediastinal lymphadenopathy consistent with recurrent carcinoma-. She underwent a lymph node biopsy on 11/3, shows metastatic small cell lung carcinoma.  As per the patient and her son, she has been nauseated , weak and not able to get up from a sitting position. She reports chest discomfort on deep breathing, and rib pain.  She denies fever or chills. She denies abdominal pain, diarrhea . No hematemesis or hemoptysis. No headache or dizziness. She is at home on 2 liters of oxygen at baseline.   ED Course: on arrival to ED, she was found hypoxic on 2 lit and required bipap . She underwent right thoracentesis but Fluid not sent for analysis, she was weaned off the BIPAP and is on 3lit of [place] oxygen.   Review of Systems: As per HPI otherwise 10 point review of systems negative.    Past Medical History:  Diagnosis Date  . Anemia   . Anxiety   . Cancer ([place])   . Diabetes ([place])    Type II  . Dyslipidemia   . History of blood transfusion   . Hypertension   . Hypothyroidism   . Lung cancer ([place]) 8/216   hilar/mediastinum lung scca  . Pneumonia 2007  . Shortness of breath dyspnea    with exertion  . Smoker    1.5 packs per day for 60 years.  Quit one month ago    Past Surgical History:  Procedure Laterality Date  . CESAREAN SECTION    . EYE SURGERY Bilateral    Cataract     reports that she quit smoking about 15 months ago. She has a 90.00 pack-year smoking  history. She has never used smokeless tobacco. She reports that she does not drink alcohol or use drugs.  Allergies  Allergen Reactions  . Tradjenta [Linagliptin] Rash    Family History  Problem Relation Age of Onset  . Cancer Mother     lung non smoker  . Diabetes Mother   . Cancer Sister     colon  . Diabetes Sister   . Clotting disorder Neg Hx      Prior to Admission medications   Medication Sig Start Date End Date Taking? Authorizing Provider  albuterol (PROVENTIL HFA;VENTOLIN HFA) 108 (90 Base) MCG/ACT inhaler Inhale 2 puffs into the lungs every 6 (six) hours as needed for shortness of breath. 12/16/15  Yes Debbe Odea, MD  cholecalciferol (VITAMIN D) 1000 UNITS tablet Take 1 tablet (1,000 Units total) by mouth daily. 11/01/14  Yes [name], NP  citalopram (CELEXA) 10 MG tablet Take 1 tablet (10 mg total) by mouth daily. 12/15/15  Yes Debbe Odea, MD  dextromethorphan-guaiFENesin (MUCINEX DM) 30-600 MG 12hr tablet Take 1 tablet by mouth 2 (two) times daily as needed for cough (congestion). 12/15/15  Yes Debbe Odea, MD  HYDROcodone-acetaminophen (NORCO/VICODIN) 5-325 MG tablet Take 1-2 tablets by mouth every 6 (six) hours as needed for moderate pain. 11/05/15  Yes Lajean Saver, MD  IRON PO Take 1 tablet by  mouth daily.   Yes Historical Provider, MD  JANUVIA 100 MG tablet TAKE ONE TABLET BY MOUTH ONCE DAILY 05/11/15  Yes [name], NP  levothyroxine (SYNTHROID, LEVOTHROID) 112 MCG tablet TAKE ONE TABLET BY MOUTH TWICE DAILY 05/11/15  Yes [name], NP  mirtazapine (REMERON) 30 MG tablet Take 1 tablet (30 mg total) by mouth at bedtime. 12/20/15  Yes [name], NP  Multiple Vitamin (MULTIVITAMIN WITH MINERALS) TABS tablet Take 1 tablet by mouth daily.   Yes Historical Provider, MD  predniSONE (DELTASONE) 10 MG tablet Take 4 tablets (40 mg total) by mouth daily with breakfast. 40 mg the next day, 30 mg the next day, 20 mg the next day, 10 mg the last day, 5 mg the last 2  days 12/16/15  Yes Debbe Odea, MD  simvastatin (ZOCOR) 20 MG tablet Take 1 tablet (20 mg total) by mouth at bedtime. 11/01/14  Yes [name], NP  vitamin B-12 (CYANOCOBALAMIN) 1000 MCG tablet Take 1 tablet (1,000 mcg total) by mouth daily. 11/01/14  Yes [name], NP  vitamin C (ASCORBIC ACID) 500 MG tablet Take 500 mg by mouth daily.   Yes Historical Provider, MD    Physical Exam: Vitals:   12/20/15 1700 12/20/15 1730 12/20/15 1745 12/20/15 1800  BP: 135/79 (!) 126/104  121/78  Pulse: 95 97 95 95  Resp: '22 24 15 18  '$ Temp:      TempSrc:      SpO2: 100% 100% 99% 96%      Constitutional: appears fatigued, on 3 lit of [place] oxygen.  Vitals:   12/20/15 1700 12/20/15 1730 12/20/15 1745 12/20/15 1800  BP: 135/79 (!) 126/104  121/78  Pulse: 95 97 95 95  Resp: '22 24 15 18  '$ Temp:      TempSrc:      SpO2: 100% 100% 99% 96%   Eyes: PERRL, lids and conjunctivae normal ENMT: Mucous membranes are moist. Neck: normal, supple, no thyromegaly Respiratory: decreased breath sounds on the right when compared to the left. Increased respiratory effort.  Cardiovascular: Regular rate and rhythm, no murmurs / rubs / gallops. No extremity edema. 2+ pedal pulses. No carotid bruits.  Abdomen: no tenderness, no masses palpated. No hepatosplenomegaly. Bowel sounds positive.  Musculoskeletal: no clubbing / cyanosis. No joint deformity upper and lower extremities. Good ROM, no contractures. Normal muscle tone.  Skin: no rashes, lesions, ulcers. No induration Neurologic: CN 2-12 grossly intact. Alert and oriented.  Psychiatric: anxious.     Labs on Admission: I have personally reviewed following labs and imaging studies  CBC:  Recent Labs Lab 12/16/15 0545 12/20/15 1536  WBC 12.0* 8.7  NEUTROABS  --  7.7  HGB 10.3* 10.4*  HCT 31.7* 32.5*  MCV 100.6* 101.9*  PLT 186 588   Basic Metabolic Panel:  Recent Labs Lab 12/14/15 0435 12/16/15 0545 12/20/15 1536  NA  --  138 138  K  --  4.2  4.7  CL  --  101 101  CO2  --  30 34*  GLUCOSE  --  128* 341*  BUN  --  38* 22*  CREATININE 1.40* 0.97 0.83  CALCIUM  --  8.4* 8.5*   GFR: Estimated Creatinine Clearance: 52.4 mL/min (by C-G formula based on SCr of 0.83 mg/dL). Liver Function Tests:  Recent Labs Lab 12/20/15 1536  AST 14*  ALT 28  ALKPHOS 63  BILITOT 0.6  PROT 6.1*  ALBUMIN 3.4*   No results for input(s): LIPASE, AMYLASE in the last  168 hours. No results for input(s): AMMONIA in the last 168 hours. Coagulation Profile: No results for input(s): INR, PROTIME in the last 168 hours. Cardiac Enzymes: No results for input(s): CKTOTAL, CKMB, CKMBINDEX, TROPONINI in the last 168 hours. BNP (last 3 results) No results for input(s): PROBNP in the last 8760 hours. HbA1C: No results for input(s): HGBA1C in the last 72 hours. CBG:  Recent Labs Lab 12/15/15 1139 12/15/15 1717 12/15/15 2141 12/16/15 0727 12/16/15 1239  GLUCAP 101* 292* 391* 112* 257*   Lipid Profile: No results for input(s): CHOL, HDL, LDLCALC, TRIG, CHOLHDL, LDLDIRECT in the last 72 hours. Thyroid Function Tests: No results for input(s): TSH, T4TOTAL, FREET4, T3FREE, THYROIDAB in the last 72 hours. Anemia Panel: No results for input(s): VITAMINB12, FOLATE, FERRITIN, TIBC, IRON, RETICCTPCT in the last 72 hours. Urine analysis:    Component Value Date/Time   COLORURINE YELLOW 11/25/2014 [place] 11/25/2014 1526   LABSPEC 1.006 11/25/2014 1526   PHURINE 6.5 11/25/2014 1526   GLUCOSEU NEGATIVE 11/25/2014 1526   HGBUR NEGATIVE 11/25/2014 [place] 11/25/2014 [place] 11/25/2014 1526   PROTEINUR NEGATIVE 11/25/2014 1526   UROBILINOGEN 0.2 11/25/2014 1526   NITRITE NEGATIVE 11/25/2014 1526   LEUKOCYTESUR TRACE (A) 11/25/2014 1526   Sepsis Labs: !!!!!!!!!!!!!!!!!!!!!!!!!!!!!!!!!!!!!!!!!!!! '@LABRCNTIP'$ (procalcitonin:4,lacticidven:4) ) Recent Results (from the past 240 hour(s))  MRSA PCR  Screening     Status: None   Collection Time: 12/11/15  3:49 AM  Result Value Ref Range Status   MRSA by PCR NEGATIVE NEGATIVE Final    Comment:        The GeneXpert MRSA Assay (FDA approved for NASAL specimens only), is one component of a comprehensive MRSA colonization surveillance program. It is not intended to diagnose MRSA infection nor to guide or monitor treatment for MRSA infections.   Culture, blood (x 2)     Status: None   Collection Time: 12/11/15  4:15 AM  Result Value Ref Range Status   Specimen Description BLOOD RIGHT FOREARM  Final   Special Requests IN PEDIATRIC BOTTLE 3CC  Final   Culture   Final    NO GROWTH 5 DAYS Performed at [facility]    Report Status 12/16/2015 FINAL  Final  Culture, blood (x 2)     Status: None   Collection Time: 12/11/15  7:04 AM  Result Value Ref Range Status   Specimen Description BLOOD RIGHT ARM  Final   Special Requests BOTTLES DRAWN AEROBIC AND ANAEROBIC 5CC  Final   Culture   Final    NO GROWTH 5 DAYS Performed at [facility]    Report Status 12/16/2015 FINAL  Final  Body fluid culture     Status: None   Collection Time: 12/11/15 10:49 AM  Result Value Ref Range Status   Specimen Description PLEURAL RIGHT  Final   Special Requests NONE  Final   Gram Stain   Final    FEW WBC PRESENT,BOTH PMN AND MONONUCLEAR NO ORGANISMS SEEN    Culture   Final    No growth aerobically or anaerobically. Performed at [facility]    Report Status 12/14/2015 FINAL  Final     Radiological Exams on Admission: Dg Chest Port 1 View  Result Date: 12/20/2015 CLINICAL DATA:  Post right thoracentesis, lung cancer EXAM: PORTABLE CHEST 1 VIEW COMPARISON:  12/20/2015 FINDINGS: Cardiomediastinal silhouette is stable. Persistent consolidation in the right upper hemi thorax. There is improvement in aeration right mid  and lower lung. Residual right basilar atelectasis or infiltrate. No pneumothorax. Left lung is clear. Again  noted old fracture deformity of right proximal humerus. Stable right IJ Port-A-Cath position IMPRESSION: Persistent consolidation in the right upper hemi thorax. There is improvement in aeration right mid and lower lung. Residual right basilar atelectasis or infiltrate. No pneumothorax. Left lung is clear. Electronically Signed   By: Lahoma Crocker M.D.   On: 12/20/2015 17:00   Dg Chest Port 1 View  Result Date: 12/20/2015 CLINICAL DATA:  Increased shortness of breath after recently being discharged with pneumonia. EXAM: PORTABLE CHEST 1 VIEW COMPARISON:  12/11/2015 FINDINGS: A right jugular Port-A-Cath remains in place with tip overlying the lower SVC. The right heart border is obscured. The visualized cardiac silhouette is unchanged. Aortic atherosclerosis is noted. There is new complete opacification of the right hemithorax without significant mediastinal shift. No evidence of left-sided lung consolidation or left pleural effusion. No pneumothorax. Proximal right humerus fracture again noted. IMPRESSION: 1. New complete opacification of the right hemithorax consistent with re-accumulation of large pleural effusion. 2. Aortic atherosclerosis. Electronically Signed   By: Logan Bores M.D.   On: 12/20/2015 15:25   US Thoracentesis Asp Pleural Space W/img Guide  Result Date: 12/20/2015 INDICATION: Metastatic small cell lung cancer, dyspnea, recurrent right pleural effusion. Request made for therapeutic right thoracentesis. EXAM: ULTRASOUND GUIDED THERAPEUTIC RIGHT THORACENTESIS MEDICATIONS: None. COMPLICATIONS: None immediate. PROCEDURE: An ultrasound guided thoracentesis was thoroughly discussed with the patient and questions answered. The benefits, risks, alternatives and complications were also discussed. The patient understands and wishes to proceed with the procedure. Written consent was obtained. Ultrasound was performed to localize and mark an adequate pocket of fluid in the right chest. The area was then  prepped and draped in the normal sterile fashion. 1% Lidocaine was used for local anesthesia. Under ultrasound guidance a Safe-T-Centesis catheter was introduced. Thoracentesis was performed. The catheter was removed and a dressing applied. FINDINGS: A total of approximately 1.1 liters of yellow fluid was removed. IMPRESSION: Successful ultrasound guided therapeutic right thoracentesis yielding 1.1 liters of pleural fluid. Read by: Rowe Robert, PA-C Electronically Signed   By: Jerilynn Mages.  Shick M.D.   On: 12/20/2015 16:50    EKG: Independently reviewed. Sinus, low voltage  Assessment/Plan Active Problems:   Recurrent right pleural effusion   Acute respiratory failure with hypoxia (HCC)   Acute respiratory failure with hypoxia secondary to  Recurrent right pleural effusion: suspect malignant effusion. Fluid analysis sent. Repeat CXR shows some residual atelectasis. No signs of pneumonia. She is afebrile, no leukocytosis,, lactic acid normal.  [place] oxygen as needed to keep sats greater than 90%.  Resume prednisone to complete the course. Bronchodilators as needed.  She might need a pleur ex catheter in the future if she has recurrent effusions.  She had an appt with Dr Nils Pyle for video bronchoscopy in the past, which she missed, she will probably need to make the appt on discharge.   / Diabetes mellitus: Elevated CBG's get HGBA1C:  Probably from use of prednisone.  CBG (last 3)  No results for input(s): GLUCAP in the last 72 hours.  Resume SSI.    Metastatic small cell lung cancer: s/p chemo and radiation 12/16.  Further management as per oncologist Dr Irene Limbo.  Will notify Dr Irene Limbo of the patient's admission.    Macrocytic anemia: hemoglobin baseline around 10. Stable. On B12 supplements.   Stage 3 CKD; stable creatinine.    Copd: on tapering steroids.  Currently  not wheezing.        DVT prophylaxis: lovenox.  Code Status: full code.  Family Communication: family at bedside.    Disposition Plan: pending further evaluation.  Consults called: IR for thoracocentesis.  Admission status: inpatient.  tele /   Hosie Poisson MD [facility] Pager [phone]  If 7PM-7AM, please contact night-coverage [email or web address] Password [facility]  12/20/2015, 6:34 PM

## 2015-12-20 NOTE — ED Notes (Signed)
ED Provider at bedside. 

## 2015-12-20 NOTE — ED Notes (Signed)
Gave report to [place], Therapist, sports on [place].

## 2015-12-20 NOTE — ED Notes (Addendum)
Respiratory at bedside.

## 2015-12-20 NOTE — ED Notes (Signed)
Pt's son requested we let him know what room his mother will be in when assigned.   John [phone]

## 2015-12-20 NOTE — ED Triage Notes (Signed)
Per EMS, pt from home c/o increased SOB since being discharged 11/4 with pneumonia. Per EMS, audible rales and rhonchi. On CPAP on arrival to ED. A&Ox4. Denies chest pain.

## 2015-12-20 NOTE — Procedures (Signed)
Ultrasound-guided  therapeutic right  thoracentesis performed yielding 1.1 liters of yellow fluid. No immediate complications. Follow-up chest x-ray pending.

## 2015-12-20 NOTE — ED Notes (Signed)
Respiratory consulted to take patient off of bi-pap per MD order

## 2015-12-20 NOTE — ED Provider Notes (Addendum)
[facility] DEPT Provider Note   CSN: [id number] Arrival date & time: 12/20/15  1426     History   Chief Complaint Chief Complaint  Patient presents with  . Shortness of Breath    HPI Rachel Chapman is a 75 y.o. female.  Pt with hx of SCLC s/p radiation and chemo, COPD, DM type 2, HTN, HLD, hypothyroidism, anemia who was discharged 12/16/2015 for respiratory failure, right-sided pleural effusion and potential pneumonia presenting today with worsening shortness of breath. Patient wearing 2 L of oxygen at home but when EMS arrived patient had audible wheezing and rhonchi and was in distress. Oxygen sats were in the low 90s. Patient states she's had a mild dry cough but no fever. Shortness of breath has been getting worse over the last 4 days well being home and some mild achiness in bilateral ribs but denies any chest pain, sputum, abdominal pain, nausea or vomiting. She has not noticed edema in her lower extremities.  She is not using breathing treatments at home but just the oxygen. Started the patient on CPAP with improvement in her tachypnea and respiratory distress.   The history is provided by the patient and the EMS personnel.  Shortness of Breath  This is a recurrent problem.    Past Medical History:  Diagnosis Date  . Anemia   . Anxiety   . Cancer ([place])   . Diabetes ([place])    Type II  . Dyslipidemia   . History of blood transfusion   . Hypertension   . Hypothyroidism   . Lung cancer ([place]) 8/216   hilar/mediastinum lung scca  . Pneumonia 2007  . Shortness of breath dyspnea    with exertion  . Smoker    1.5 packs per day for 60 years.  Quit one month ago    Patient Active Problem List   Diagnosis Date Noted  . COPD exacerbation ([place]) 12/15/2015  . MDD (major depressive disorder), recurrent episode, moderate ([place]) 12/14/2015  . Insomnia 12/14/2015  . Respiratory failure ([place]) 12/11/2015  . Acute respiratory failure with hypoxia and hypercapnia ([place])  12/11/2015  . Chronic kidney disease, stage III (moderate) 12/11/2015  . SIRS (systemic inflammatory response syndrome) ([place]) 12/11/2015  . Pleural effusion on right 12/11/2015  . Risk of brain metastases warranting prophylactic brain radiation 03/15/2015  . Anemia due to antineoplastic chemotherapy 01/18/2015  . Nausea with vomiting   . Symptomatic anemia   . Protein-calorie malnutrition, severe ([place]) 12/13/2014  . Hyponatremia 12/13/2014  . Orthostatic hypotension 12/13/2014  . Neoplasm related pain 12/13/2014  . Nausea without vomiting 12/13/2014  . Dehydration 12/12/2014  . Radiation-induced esophagitis 12/12/2014  . Hypotension 12/12/2014  . Antineoplastic chemotherapy induced pancytopenia ([place]) 12/12/2014  . Diabetes type 2, controlled ([place]) 11/01/2014  . Hypothyroidism 11/01/2014  . Hyperlipidemia 11/01/2014  . Small cell carcinoma of right lung ([place]) 09/26/2014    Past Surgical History:  Procedure Laterality Date  . CESAREAN SECTION    . EYE SURGERY Bilateral    Cataract    OB History    No data available       Home Medications    Prior to Admission medications   Medication Sig Start Date End Date Taking? Authorizing Provider  albuterol (PROVENTIL HFA;VENTOLIN HFA) 108 (90 Base) MCG/ACT inhaler Inhale 2 puffs into the lungs every 6 (six) hours as needed for shortness of breath. 12/16/15   Debbe Odea, MD  cholecalciferol (VITAMIN D) 1000 UNITS tablet Take 1 tablet (1,000 Units total) by mouth daily.  11/01/14   [name], NP  citalopram (CELEXA) 10 MG tablet Take 1 tablet (10 mg total) by mouth daily. 12/15/15   Debbe Odea, MD  dextromethorphan-guaiFENesin (MUCINEX DM) 30-600 MG 12hr tablet Take 1 tablet by mouth 2 (two) times daily as needed for cough (congestion). 12/15/15   Debbe Odea, MD  HYDROcodone-acetaminophen (NORCO/VICODIN) 5-325 MG tablet Take 1-2 tablets by mouth every 6 (six) hours as needed for moderate pain. 11/05/15   Lajean Saver, MD  IRON PO  Take 1 tablet by mouth daily.    Historical Provider, MD  JANUVIA 100 MG tablet TAKE ONE TABLET BY MOUTH ONCE DAILY 05/11/15   [name], NP  levothyroxine (SYNTHROID, LEVOTHROID) 112 MCG tablet TAKE ONE TABLET BY MOUTH TWICE DAILY 05/11/15   [name], NP  mirtazapine (REMERON) 30 MG tablet Take 1 tablet (30 mg total) by mouth at bedtime. 12/20/15   [name], NP  Multiple Vitamin (MULTIVITAMIN WITH MINERALS) TABS tablet Take 1 tablet by mouth daily.    Historical Provider, MD  predniSONE (DELTASONE) 10 MG tablet Take 4 tablets (40 mg total) by mouth daily with breakfast. 40 mg the next day, 30 mg the next day, 20 mg the next day, 10 mg the last day, 5 mg the last 2 days 12/16/15   Debbe Odea, MD  simvastatin (ZOCOR) 20 MG tablet Take 1 tablet (20 mg total) by mouth at bedtime. 11/01/14   [name], NP  vitamin B-12 (CYANOCOBALAMIN) 1000 MCG tablet Take 1 tablet (1,000 mcg total) by mouth daily. 11/01/14   [name], NP  vitamin C (ASCORBIC ACID) 500 MG tablet Take 500 mg by mouth daily.    Historical Provider, MD    Family History Family History  Problem Relation Age of Onset  . Cancer Mother     lung non smoker  . Diabetes Mother   . Cancer Sister     colon  . Diabetes Sister   . Clotting disorder Neg Hx     Social History Social History  Substance Use Topics  . Smoking status: Former Smoker    Packs/day: 1.50    Years: 60.00    Quit date: 09/12/2014  . Smokeless tobacco: Never Used  . Alcohol use No     Allergies   Tradjenta [linagliptin]   Review of Systems Review of Systems  Respiratory: Positive for shortness of breath.   All other systems reviewed and are negative.    Physical Exam Updated Vital Signs BP 146/84 (BP Location: Right Arm)   Pulse 100   Temp 97.9 F (36.6 C) (Oral)   Resp 20   SpO2 98%   Physical Exam  Constitutional: She is oriented to person, place, and time. She appears well-developed and well-nourished. No distress.    HENT:  Head: Normocephalic and atraumatic.  Mouth/Throat: Mucous membranes are dry.  Eyes: Conjunctivae and EOM are normal. Pupils are equal, round, and reactive to light.  Neck: Normal range of motion. Neck supple.  Cardiovascular: Regular rhythm and intact distal pulses.  Tachycardia present.   No murmur heard. Pulmonary/Chest: Effort normal. No accessory muscle usage. Tachypnea noted. No respiratory distress. She has wheezes. She has rhonchi. She has rales.  Occasional wheezing but diffuse rales and rhonchi  Abdominal: Soft. She exhibits no distension. There is no tenderness. There is no rebound and no guarding.  Musculoskeletal: Normal range of motion. She exhibits edema. She exhibits no tenderness.  Trace edema in the bilateral ankles  Neurological: She is alert and oriented  to person, place, and time.  Skin: Skin is warm and dry. No rash noted. No erythema.  Psychiatric: She has a normal mood and affect. Her behavior is normal.  Nursing note and vitals reviewed.    ED Treatments / Results  Labs (all labs ordered are listed, but only abnormal results are displayed) Labs Reviewed  CBC WITH DIFFERENTIAL/PLATELET - Abnormal; Notable for the following:       Result Value   RBC 3.19 (*)    Hemoglobin 10.4 (*)    HCT 32.5 (*)    MCV 101.9 (*)    Lymphs Abs 0.5 (*)    All other components within normal limits  COMPREHENSIVE METABOLIC PANEL - Abnormal; Notable for the following:    CO2 34 (*)    Glucose, Bld 341 (*)    BUN 22 (*)    Calcium 8.5 (*)    Total Protein 6.1 (*)    Albumin 3.4 (*)    AST 14 (*)    Anion gap 3 (*)    All other components within normal limits  BLOOD GAS, ARTERIAL - Abnormal; Notable for the following:    pCO2 arterial 59.8 (*)    pO2, Arterial 112 (*)    Bicarbonate 33.2 (*)    Acid-Base Excess 6.9 (*)    All other components within normal limits  BRAIN NATRIURETIC PEPTIDE  I-STAT TROPOININ, ED  I-STAT CG4 LACTIC ACID, ED    EKG  EKG  Interpretation  Date/Time:  Wednesday December 20 2015 14:44:35 EST Ventricular Rate:  99 PR Interval:    QRS Duration: 90 QT Interval:  332 QTC Calculation: 426 R Axis:   17 Text Interpretation:  Sinus rhythm Low voltage, extremity and precordial leads No significant change since last tracing Confirmed by Maryan Rued  MD, Loree Fee ([id number]) on 12/20/2015 2:51:51 PM Also confirmed by Maryan Rued  MD, Loree Fee ([id number]), editor [place], Joelene Millin [phone])  on 12/20/2015 3:11:29 PM       Radiology Dg Chest Port 1 View  Result Date: 12/20/2015 CLINICAL DATA:  Increased shortness of breath after recently being discharged with pneumonia. EXAM: PORTABLE CHEST 1 VIEW COMPARISON:  12/11/2015 FINDINGS: A right jugular Port-A-Cath remains in place with tip overlying the lower SVC. The right heart border is obscured. The visualized cardiac silhouette is unchanged. Aortic atherosclerosis is noted. There is new complete opacification of the right hemithorax without significant mediastinal shift. No evidence of left-sided lung consolidation or left pleural effusion. No pneumothorax. Proximal right humerus fracture again noted. IMPRESSION: 1. New complete opacification of the right hemithorax consistent with re-accumulation of large pleural effusion. 2. Aortic atherosclerosis. Electronically Signed   By: Logan Bores M.D.   On: 12/20/2015 15:25    Procedures Procedures (including critical care time)  Medications Ordered in ED Medications  albuterol (PROVENTIL) (2.5 MG/3ML) 0.083% nebulizer solution 5 mg (not administered)     Initial Impression / Assessment and Plan / ED Course  I have reviewed the triage vital signs and the nursing notes.  Pertinent labs & imaging results that were available during my care of the patient were reviewed by me and considered in my medical decision making (see chart for details).  Clinical Course    Patient is a 75 year old female multiple medical problems recently hospitalized for  pleural effusions, pneumonia who went home 4 days ago returning today with worsening shortness of breath. She is wearing 2 L of oxygen at home without improvement of her symptoms. She is currently not using any breathing treatments  and denies any chest pain or lower extremity edema. However patient does have a history of chronic kidney disease, lung cancer and COPD. Also history of right-sided pleural effusion. Given exam today concern for recurrent pleural effusion versus CHF versus worsening renal function versus COPD exacerbation or a mix of all the above. Patient is currently comfortable on BiPAP in mental status is normal. Will continue BiPAP at this time and give 1 albuterol treatment to see if there is any improvement. Chest x-ray, CBC, CMP, BNP, troponin, ABG, EKG pending  3:45 PM CXR showed reaccumulation of pleural effusion on the right which is most likely the cause of worsening SOB.  Will call IR for possible drain.  Pt ABG not significantly changed.  Spoke with IR and U/S guided thoracentesis ordered  4:21 PM Labs without acute changes.  Will admit for further care.  Final Clinical Impressions(s) / ED Diagnoses   Final diagnoses:  SOB (shortness of breath)  Pleural effusion on right    New Prescriptions New Prescriptions   No medications on file     Blanchie Dessert, MD 12/20/15 1621    Blanchie Dessert, MD 12/20/15 1704

## 2015-12-20 NOTE — Telephone Encounter (Signed)
BJ w/ [facility] states they are resuming home health care today. However, if pt is going in pt rehab, do they need to hold off? They need to hear something asap. Nurse waiting to hear about recommendation from [facility].

## 2015-12-20 NOTE — ED Notes (Signed)
Pt ambulatory to bathroom

## 2015-12-20 NOTE — Progress Notes (Signed)
Subjective:    Patient ID: Rachel Chapman, female    DOB: 07/04/1940, 75 y.o.   MRN: [id number]  HPI  75 year old female who  has a past medical history of Anemia; Anxiety; Cancer ([place]); Diabetes ([place]); Dyslipidemia; History of blood transfusion; Hypertension; Hypothyroidism; Lung cancer ([place]) (8/216); Pneumonia (2007); Shortness of breath dyspnea; and Smoker. She presents with her son today for hospital follow up. She was admitted on 12/11/2015 and discharged on 12/15/2015. She was originally brought to the emergency room complaining of gradually worsening shortness of breath. She stated that her shortness of breath is worse at night compared to during the day. Her symptoms were a dry cough and wheezing. She did have an inhaler at home and she is not using it  Hospital course is as follows:  (A) Pleural effusion on right- ? HCAP - s/p thoracentesis on 10/31- fluid is transdative per lights criteria using glucose and reveals 541 wbc, mostly lymphs and monocytes- no malignant cells- ECHO - no underlying pneumonia seen on CXR or CT after effusion drained- d/c'd antibiotics- symptoms of cough resolving  (B) COPD? - no formal diagnosis - quit smoking a littleover a year ago - admits to mucous which she can here in her chest but is not able to cough up- cant walk around the room without getting short of breath- her family states she barely ambulates at home which they suspect is due to depression.  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 - will need 2 L O2 on exertion- pulse ox drops to 82-88% on exertion  Small cell carcinoma of right lung - 12/16- treated with chemo, radiation to lungs and  prophylactic radiation to head - recent PET 10/11/15 showing new mediastinal lymphadenopathy consistent with recurrent carcinoma- she has not had a biopsy yet as mentioned by Dr Irene Limbo in his last note because she did not want to go through with it because she fell and fractured her arm - CT chest reveals progression of mediastinal, b/lo supraclavicular notes, R pl effusion and nodularity and small pericardial effusion- see report below - IR guided bx of supraclavicular lymph node done 1/3  SIRS (systemic inflammatory response syndrome) - tachycardic and tachypneic - resolved  Recent right humural fracture -cont sling- outp f/u with [facility]  Diabetes type 2 -- A1c 6.7 - she states her sugars are usually in the 200s- takes Januvia - sugars elevated due to steroids which I am weaning- sugars have since improved - has been receiving insulin in the hospital  Hypothyroidism - synthroid  Severe anxiety and depression - Celexa - consulted psych as she has continued depression - recommendations: -Decrease Celexa to '10mg'$  (to cross titrate downward)- we have done this -Continue Remeron '15mg'$  po qhs for MDD/insomnia (may titrate up to '30mg'$  outpatient in 2 weeks with concomitant cessation of Celexa '10mg'$ ) -Vistaril '25mg'$  po tid prn anxiety (use caution and keep lose-dose so as to avoid fall risk) NOTE: I have not yet ordered Vistaril as I have not witnessed episodic panic issues and she has already fallen and is a high risk for further falls  Today in the clinic son is extremely worried as he feels as though the patient has decompensated since being at home. He reports in the hospital the patient was  able to get out of the wheelchair or bed without much assistance, but since being at home he has to help her out of the wheelchair or she would be unable to get out of it at all. She is wearing her home oxygen at 2 L continuously and her son has been monitoring her pulse ox he reports that with  exertion while on 2 L she continuously drops into the low 80s and she becomes extremely short of breath   Rachel Chapman is not eating or drinking much at home unlike what she was doing while in the hospital. This has caused her to become very weak and she wants to sleep most of the day.  Additionally Rachel Chapman feels as though she often forgets things that are "on the tip of my tongue"and that she is having some word finding issues.  Both Rachel Chapman and her son feel as though she would benefit from a inpatient rehabilitation. When asked why they did not do this in the hospital her son states "she was drinking fine and she is able to get up out of her wheelchair they felt that she did not need a rehabilitation facility but since getting her home she's taken a turn for the worse and I feel as though a rehabilitation facility would be best for her." They have looked for permanent placement but are unable to afford it at this time   Review of Systems  Constitutional: Positive for activity change, appetite change and fatigue. Negative for diaphoresis and fever.  HENT: Negative.   Eyes: Negative.   Respiratory: Positive for cough, shortness of breath and wheezing. Negative for apnea, choking, chest tightness and stridor.   Cardiovascular: Negative.   Gastrointestinal: Negative.   Endocrine: Negative.   Genitourinary: Negative.   Musculoskeletal: Positive for gait problem. Negative for joint swelling.  Skin: Positive for pallor.  Allergic/Immunologic: Negative.   Neurological: Positive for weakness.  Psychiatric/Behavioral: Positive for confusion, decreased concentration and sleep disturbance. The patient is nervous/anxious.        Depressed  All other systems reviewed and are negative.  Past Medical History:  Diagnosis Date  . Anemia   . Anxiety   . Cancer ([place])   . Diabetes ([place])    Type II  . Dyslipidemia   . History of blood transfusion   . Hypertension   . Hypothyroidism   . Lung cancer  ([place]) 8/216   hilar/mediastinum lung scca  . Pneumonia 2007  . Shortness of breath dyspnea    with exertion  . Smoker    1.5 packs per day for 60 years.  Quit one month ago    Social History   Social History  . Marital status: Widowed    Spouse name: N/A  . Number of children: N/A  . Years of education: N/A   Occupational History  . Not on file.   Social History Main Topics  . Smoking status: Former Smoker    Packs/day: 1.50    Years: 60.00    Quit date: 09/12/2014  . Smokeless tobacco: Never Used  . Alcohol use No  . Drug use: No  . Sexual activity: No   Other Topics Concern  . Not on file   Social History Narrative   She has moved from [place]   She is retired from working with special education    Was married widowed twice.           Past Surgical History:  Procedure Laterality Date  . CESAREAN SECTION    .  EYE SURGERY Bilateral    Cataract    Family History  Problem Relation Age of Onset  . Cancer Mother     lung non smoker  . Diabetes Mother   . Cancer Sister     colon  . Diabetes Sister   . Clotting disorder Neg Hx     Allergies  Allergen Reactions  . Tradjenta [Linagliptin] Rash    Current Outpatient Prescriptions on File Prior to Visit  Medication Sig Dispense Refill  . albuterol (PROVENTIL HFA;VENTOLIN HFA) 108 (90 Base) MCG/ACT inhaler Inhale 2 puffs into the lungs every 6 (six) hours as needed for shortness of breath. 3.7 g 3  . cholecalciferol (VITAMIN D) 1000 UNITS tablet Take 1 tablet (1,000 Units total) by mouth daily. 90 tablet 3  . citalopram (CELEXA) 10 MG tablet Take 1 tablet (10 mg total) by mouth daily. 60 tablet 3  . dextromethorphan-guaiFENesin (MUCINEX DM) 30-600 MG 12hr tablet Take 1 tablet by mouth 2 (two) times daily as needed for cough (congestion). 60 tablet 0  . HYDROcodone-acetaminophen (NORCO/VICODIN) 5-325 MG tablet Take 1-2 tablets by mouth every 6 (six) hours as needed for moderate pain. 20 tablet 0  . IRON PO Take 1  tablet by mouth daily.    Marland Kitchen JANUVIA 100 MG tablet TAKE ONE TABLET BY MOUTH ONCE DAILY 30 tablet 11  . levothyroxine (SYNTHROID, LEVOTHROID) 112 MCG tablet TAKE ONE TABLET BY MOUTH TWICE DAILY 90 tablet 3  . mirtazapine (REMERON) 15 MG tablet Take 1 tablet (15 mg total) by mouth at bedtime. 90 tablet 1  . Multiple Vitamin (MULTIVITAMIN WITH MINERALS) TABS tablet Take 1 tablet by mouth daily.    . predniSONE (DELTASONE) 10 MG tablet Take 4 tablets (40 mg total) by mouth daily with breakfast. 40 mg the next day, 30 mg the next day, 20 mg the next day, 10 mg the last day, 5 mg the last 2 days 11 tablet 0  . simvastatin (ZOCOR) 20 MG tablet Take 1 tablet (20 mg total) by mouth at bedtime. 90 tablet 3  . vitamin B-12 (CYANOCOBALAMIN) 1000 MCG tablet Take 1 tablet (1,000 mcg total) by mouth daily. 90 tablet 3  . vitamin C (ASCORBIC ACID) 500 MG tablet Take 500 mg by mouth daily.     Current Facility-Administered Medications on File Prior to Visit  Medication Dose Route Frequency Provider Last Rate Last Dose  . albuterol (PROVENTIL) (2.5 MG/3ML) 0.083% nebulizer solution 2.5 mg  2.5 mg Nebulization Once [employer], NP      . sodium chloride 0.9 % injection 10 mL  10 mL Intracatheter PRN [name], MD   10 mL at 11/11/14 1131  . sodium chloride 0.9 % injection 10 mL  10 mL Intravenous PRN [name], MD   10 mL at 01/18/15 1527    BP (!) 90/50 (BP Location: Left Arm, Patient Position: Sitting, Cuff Size: Normal)   Pulse (!) 106   Temp 98.4 F (36.9 C) (Oral)   Resp (!) 24   Ht '5\' 2"'$  (1.575 m)   Wt 157 lb (71.2 kg)   SpO2 96% Comment: Oxygen on at 2 L/min via nasal cannula  BMI 28.72 kg/m       Objective:   Physical Exam  Constitutional: She is oriented to person, place, and time. She appears well-developed and well-nourished. No distress.  Eyes: Conjunctivae and EOM are normal. Pupils are equal, round, and reactive to light. Right eye exhibits discharge. Left eye  exhibits no  discharge. No scleral icterus.  Neck: Normal range of motion. Neck supple. No thyromegaly present.  Cardiovascular: Normal rate, regular rhythm, normal heart sounds and intact distal pulses.  Exam reveals no gallop and no friction rub.   No murmur heard. Pulmonary/Chest: Effort normal. No respiratory distress. She has decreased breath sounds in the right lower field, the left middle field and the left lower field. She has wheezes. She has no rhonchi. She has no rales. She exhibits no tenderness.  Abdominal: Soft. Bowel sounds are normal. There is no tenderness.  Musculoskeletal: She exhibits no edema, tenderness or deformity.  Since the wheelchair. She was able to stand up from the sitting position with much difficulty and minimal assistance  Lymphadenopathy:    She has no cervical adenopathy.  Neurological: She is alert and oriented to person, place, and time. She has normal reflexes.  Skin: Skin is warm and dry. No rash noted. She is not diaphoretic. No erythema. There is pallor.  Psychiatric: She has a normal mood and affect. Her behavior is normal. Judgment and thought content normal.  Nursing note and vitals reviewed.     Assessment & Plan:    1. Encounter for rehabilitation I agree with patient and her family that a rehabilitation facility might be beneficial for her. She appears weak and is a fall risk. She is not eating nor drinking that she should and there appears to be some family issues at home which keep her shut in her room( patients choice)  throughout the day. - Will attempt to get her placed in inpatient rehabilitation facility  2. Small cell carcinoma of right lung ([place]) - Has likely returned. Awaiting biopsy results - Patient has mentioned to me in the past that she would probably forego any additional treatment for lung cancer at this time - Consider hospice  3. Controlled type 2 diabetes mellitus with diabetic nephropathy, without long-term current use of  insulin ([place]) - Well-controlled. No change in medication at this time  4. Anxiety and depression - She is not sleeping at night and her appetite is poor. I'm going to increase Remeron from 15 mg to 30 mg daily at bedtime - mirtazapine (REMERON) 30 MG tablet; Take 1 tablet (30 mg total) by mouth at bedtime.  Dispense: 90 tablet; Refill: 1 - Follow up in 4 weeks  [name], NP

## 2015-12-20 NOTE — Telephone Encounter (Signed)
I spoke with [name] - he states it is okay to resume [name] today. [name] states he is working on getting patient into rehab asap.  BJ notified and verbalized understanding and states that she would let the nurse know. Thanks.

## 2015-12-21 ENCOUNTER — Ambulatory Visit: Payer: Self-pay | Admitting: Hematology

## 2015-12-21 ENCOUNTER — Ambulatory Visit: Payer: Self-pay | Admitting: Adult Health

## 2015-12-21 ENCOUNTER — Inpatient Hospital Stay (HOSPITAL_COMMUNITY): Payer: Medicare Other

## 2015-12-21 ENCOUNTER — Other Ambulatory Visit: Payer: Self-pay

## 2015-12-21 DIAGNOSIS — C770 Secondary and unspecified malignant neoplasm of lymph nodes of head, face and neck: Secondary | ICD-10-CM

## 2015-12-21 DIAGNOSIS — C3490 Malignant neoplasm of unspecified part of unspecified bronchus or lung: Secondary | ICD-10-CM

## 2015-12-21 DIAGNOSIS — J90 Pleural effusion, not elsewhere classified: Secondary | ICD-10-CM

## 2015-12-21 LAB — GLUCOSE, CAPILLARY
GLUCOSE-CAPILLARY: 240 mg/dL — AB (ref 65–99)
GLUCOSE-CAPILLARY: 412 mg/dL — AB (ref 65–99)
GLUCOSE-CAPILLARY: 203 mg/dL — AB (ref 65–99)
Glucose-Capillary: 392 mg/dL — ABNORMAL HIGH (ref 65–99)
Glucose-Capillary: 348 mg/dL — ABNORMAL HIGH (ref 65–99)

## 2015-12-21 LAB — BASIC METABOLIC PANEL
ANION GAP: 6 (ref 5–15)
BUN: 30 mg/dL — ABNORMAL HIGH (ref 6–20)
CALCIUM: 8.3 mg/dL — AB (ref 8.9–10.3)
CO2: 31 mmol/L (ref 22–32)
Chloride: 100 mmol/L — ABNORMAL LOW (ref 101–111)
Creatinine, Ser: 1.28 mg/dL — ABNORMAL HIGH (ref 0.44–1.00)
GFR calc Af Amer: 46 mL/min — ABNORMAL LOW (ref >60)
GFR calc non Af Amer: 39 mL/min — ABNORMAL LOW (ref >60)
GLUCOSE: 475 mg/dL — AB (ref 65–99)
Potassium: 4.7 mmol/L (ref 3.5–5.1)
Sodium: 137 mmol/L (ref 135–145)

## 2015-12-21 LAB — CK: CK TOTAL: 24 U/L — AB (ref 38–234)

## 2015-12-21 MED ORDER — IPRATROPIUM-ALBUTEROL 0.5-2.5 (3) MG/3ML IN SOLN
3.0000 mL | Freq: Four times a day (QID) | RESPIRATORY_TRACT | Status: DC
  Administered 2015-12-21 – 2015-12-25 (×17): 3 mL via RESPIRATORY_TRACT
  Filled 2015-12-21 (×17): qty 3

## 2015-12-21 MED ORDER — IOPAMIDOL (ISOVUE-300) INJECTION 61%
75.0000 mL | Freq: Once | INTRAVENOUS | Status: AC | PRN
  Administered 2015-12-21: 75 mL via INTRAVENOUS

## 2015-12-21 MED ORDER — ORAL CARE MOUTH RINSE
15.0000 mL | Freq: Two times a day (BID) | OROMUCOSAL | Status: DC
  Administered 2015-12-21 – 2015-12-25 (×7): 15 mL via OROMUCOSAL

## 2015-12-21 MED ORDER — LORAZEPAM 1 MG PO TABS
1.0000 mg | ORAL_TABLET | Freq: Three times a day (TID) | ORAL | Status: DC | PRN
  Administered 2015-12-21: 1 mg via ORAL
  Filled 2015-12-21 (×2): qty 1

## 2015-12-21 MED ORDER — INSULIN ASPART 100 UNIT/ML ~~LOC~~ SOLN
0.0000 [IU] | Freq: Three times a day (TID) | SUBCUTANEOUS | Status: DC
Start: 2015-12-21 — End: 2015-12-22
  Administered 2015-12-21: 15 [IU] via SUBCUTANEOUS
  Administered 2015-12-22: 3 [IU] via SUBCUTANEOUS

## 2015-12-21 MED ORDER — INSULIN ASPART 100 UNIT/ML ~~LOC~~ SOLN
15.0000 [IU] | Freq: Once | SUBCUTANEOUS | Status: AC
  Administered 2015-12-21: 15 [IU] via SUBCUTANEOUS

## 2015-12-21 MED ORDER — SODIUM CHLORIDE 0.9% FLUSH
10.0000 mL | Freq: Two times a day (BID) | INTRAVENOUS | Status: DC
  Administered 2015-12-22 – 2015-12-23 (×2): 10 mL

## 2015-12-21 MED ORDER — SODIUM CHLORIDE 0.9% FLUSH: 10.0000 mL | INTRAVENOUS | Status: DC | PRN

## 2015-12-21 MED ORDER — INSULIN ASPART 100 UNIT/ML ~~LOC~~ SOLN
3.0000 [IU] | Freq: Three times a day (TID) | SUBCUTANEOUS | Status: DC
  Administered 2015-12-21 – 2015-12-22 (×2): 3 [IU] via SUBCUTANEOUS

## 2015-12-21 NOTE — Progress Notes (Signed)
Family at bedside concerned about time for thoracentesis and feeling like patient is having more difficulty breathing than this morning. Dr. Karleen Hampshire made aware and will come to unit to assess patient and determine if patient needs to be transferred to stepdown. Family updated and ok with this plan.

## 2015-12-21 NOTE — Progress Notes (Signed)
Patient continues to experience shortness of breath and difficulty breathing. Patient's assessment and condition are unchanged from previous RN's assessment.  MD came up to see patient and patient will be transferred shortly to stepdown for closer monitoring (?bipap if needed).  Patient has no complaints at this time.  Waiting for stepdown nurse to call back for report. Azzie Glatter [place]

## 2015-12-21 NOTE — Progress Notes (Signed)
Per ultrasound paracentesis can not be done unless MD calls PA in after hours. Dr. Karleen Hampshire notified and made aware.

## 2015-12-21 NOTE — Progress Notes (Signed)
RT assessed Pt for BIPAP.  Pt currently on 3 LPM [place] and tolerating well at this time.  Pt has a mild increase in WOB with bilateral coarse breathsounds.  Neb treatment administered and pt is resting comfortably in bed with eyes closed.  RT will hold BIPAP at this time, will re-evaluate when pt arrives to stepdown/ICU.

## 2015-12-21 NOTE — Progress Notes (Signed)
Gave report to [place], RN on [address] [place]. Left number in case she had additional questions.

## 2015-12-21 NOTE — Progress Notes (Signed)
PROGRESS NOTE    Rachel Chapman  WER:[id number] DOB: 05-10-1940 DOA: 12/20/2015 PCP: [name], NP    Brief Narrative: Rachel Chapman is a 75 y.o. female with medical history significant of SCLC s/p radiation and chemo,COPD, DM type 2,HTN, HLD, hypothyroidism, anemia, and anxiety, recently discharged from the hospital on 11/3 after being treated for right pleural effusion/ hcap, presents today for worsening sob. She had a  recent PET 10/11/15 showing new mediastinal lymphadenopathy consistent with recurrent carcinoma-. She underwent a lymph node biopsy on 11/3, shows metastatic small cell lung carcinoma. She underwent thoracentesis on 11/8 and fluid sent for analysis.   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 Fluid sent for analysis.  Worsening breathing on 11/9, repeat CXR, complete opacification on the right, .  Transfer the pateint to step down, get CT chest.  Request for repeat US thoracentesis.    Metastatic small cell lung ca:  Further management as per Dr Irene Limbo.  Suspect she will not tolerate chemo at this point.    Severe protein calorie malnutrition.    Acute kidney injury: Unclear etiology. Get UA, dehydration ? Gentle hydration  Repeat renal parameters in am.    Diabetes mellitus: CBG (last 3)   Recent Labs  12/21/15 1139 12/21/15 1621 12/21/15 1701  GLUCAP 412* 392* 348*    Change to resistant scale and add premeal novolog coverage.    DVT prophylaxis: (Lovenox/ Code Status: (DNI ONLY. Family Communication: multiple family members at bedside.  Disposition Plan: pending further eval.   Consultants:    Oncology.   Procedures: thoracentesis.   Antimicrobials: none.    Subjective: Sob worsening.   Objective: Vitals:   12/20/15 2345 12/21/15 0505 12/21/15 0743 12/21/15 0941  BP: 135/78 125/81  (!) 124/56  Pulse: (!) 105 96  (!) 104  Resp: (!) '22 20  17  '$ Temp: 98.7 F (37.1 C) 98.5 F (36.9 C)  98.5 F (36.9 C)  TempSrc: Oral Oral  Oral  SpO2: 100% 100% 97% 98%  Weight: 69.9 kg (154 lb)     Height: '5\' 3"'$  (1.6 m)       Intake/Output Summary (Last 24 hours) at 12/21/15 1322 Last data filed at 12/21/15 0801  Gross per 24 hour  Intake              240 ml  Output                0 ml  Net              240 ml   Filed Weights   12/20/15 2345  Weight: 69.9 kg (154 lb)    Examination:  General exam: in distress.  Respiratory system: bilateral rhonchi, diminished on the right.  Cardiovascular system: tachycardic. , No pedal edema. Gastrointestinal system: Abdomen is nondistended, soft and nontender. No organomegaly or masses felt. Normal bowel sounds heard. Central nervous system: Alert and oriented. No focal neurological deficits. Extremities: Symmetric 5 x 5 power. Skin: No rashes, lesions or ulcers      Data Reviewed: I have personally reviewed following labs and imaging studies  CBC:  Recent Labs Lab 12/16/15 0545 12/20/15 1536  WBC  12.0* 8.7  NEUTROABS  --  7.7  HGB 10.3* 10.4*  HCT 31.7* 32.5*  MCV 100.6* 101.9*  PLT 186 308   Basic Metabolic Panel:  Recent Labs Lab 12/16/15 0545 12/20/15 1536  NA 138 138  K 4.2 4.7  CL 101 101  CO2 30 34*  GLUCOSE 128* 341*  BUN 38* 22*  CREATININE 0.97 0.83  CALCIUM 8.4* 8.5*   GFR: Estimated Creatinine Clearance: 53.2 mL/min (by C-G formula based on SCr of 0.83 mg/dL). Liver Function Tests:  Recent Labs Lab 12/20/15 1536  AST 14*  ALT 28  ALKPHOS 63  BILITOT 0.6  PROT 6.1*  ALBUMIN 3.4*   No results for input(s): LIPASE, AMYLASE in the last 168 hours. No results for input(s): AMMONIA in  the last 168 hours. Coagulation Profile: No results for input(s): INR, PROTIME in the last 168 hours. Cardiac Enzymes:  Recent Labs Lab 12/21/15 0535  CKTOTAL 24*   BNP (last 3 results) No results for input(s): PROBNP in the last 8760 hours. HbA1C: No results for input(s): HGBA1C in the last 72 hours. CBG:  Recent Labs Lab 12/16/15 0727 12/16/15 1239 12/20/15 2127 12/21/15 0808 12/21/15 1139  GLUCAP 112* 257* 345* 240* 412*   Lipid Profile: No results for input(s): CHOL, HDL, LDLCALC, TRIG, CHOLHDL, LDLDIRECT in the last 72 hours. Thyroid Function Tests: No results for input(s): TSH, T4TOTAL, FREET4, T3FREE, THYROIDAB in the last 72 hours. Anemia Panel: No results for input(s): VITAMINB12, FOLATE, FERRITIN, TIBC, IRON, RETICCTPCT in the last 72 hours. Sepsis Labs:  Recent Labs Lab 12/20/15 1549  LATICACIDVEN 0.83    Recent Results (from the past 240 hour(s))  Body fluid culture (includes gram stain)     Status: None (Preliminary result)   Collection Time: 12/20/15  8:07 PM  Result Value Ref Range Status   Specimen Description FLUID RIGHT PLEURAL  Final   Special Requests NONE  Final   Gram Stain   Final    ABUNDANT WBC PRESENT, PREDOMINANTLY MONONUCLEAR NO ORGANISMS SEEN    Culture   Final    NO GROWTH < 12 HOURS Performed at [facility]    Report Status PENDING  Incomplete         Radiology Studies: Dg Chest 2 View  Result Date: 12/21/2015 CLINICAL DATA:  Chest pain and shortness of Breath, history of lung carcinoma EXAM: CHEST  2 VIEW COMPARISON:  12/20/2015 FINDINGS: Cardiac shadow is stable. Right chest wall port is again seen. There is now complete opacification of the right hemi thorax increased from the prior exam likely related to pleural effusion as well as consolidation in the right lung. The left lung remains clear. No acute bony abnormality is noted. Prior fracture of the proximal right humerus is seen. IMPRESSION: Significant  opacification of the right hemi thorax increased from the previous day. This is likely related to a combination of consolidation and pleural effusion. Stable right humeral fracture Electronically Signed   By: Inez Catalina M.D.   On: 12/21/2015 10:18   Dg Chest Port 1 View  Result Date: 12/20/2015 CLINICAL DATA:  Post right thoracentesis, lung cancer EXAM: PORTABLE CHEST 1 VIEW COMPARISON:  12/20/2015 FINDINGS: Cardiomediastinal silhouette is stable. Persistent consolidation in the right upper hemi thorax. There is improvement in aeration right mid and lower lung. Residual right basilar atelectasis or infiltrate. No pneumothorax. Left lung is clear. Again noted old fracture deformity of right proximal humerus. Stable right IJ Port-A-Cath position IMPRESSION: Persistent consolidation in the right  upper hemi thorax. There is improvement in aeration right mid and lower lung. Residual right basilar atelectasis or infiltrate. No pneumothorax. Left lung is clear. Electronically Signed   By: Lahoma Crocker M.D.   On: 12/20/2015 17:00   Dg Chest Port 1 View  Result Date: 12/20/2015 CLINICAL DATA:  Increased shortness of breath after recently being discharged with pneumonia. EXAM: PORTABLE CHEST 1 VIEW COMPARISON:  12/11/2015 FINDINGS: A right jugular Port-A-Cath remains in place with tip overlying the lower SVC. The right heart border is obscured. The visualized cardiac silhouette is unchanged. Aortic atherosclerosis is noted. There is new complete opacification of the right hemithorax without significant mediastinal shift. No evidence of left-sided lung consolidation or left pleural effusion. No pneumothorax. Proximal right humerus fracture again noted. IMPRESSION: 1. New complete opacification of the right hemithorax consistent with re-accumulation of large pleural effusion. 2. Aortic atherosclerosis. Electronically Signed   By: Logan Bores M.D.   On: 12/20/2015 15:25   US Thoracentesis Asp Pleural Space W/img  Guide  Result Date: 12/20/2015 INDICATION: Metastatic small cell lung cancer, dyspnea, recurrent right pleural effusion. Request made for therapeutic right thoracentesis. EXAM: ULTRASOUND GUIDED THERAPEUTIC RIGHT THORACENTESIS MEDICATIONS: None. COMPLICATIONS: None immediate. PROCEDURE: An ultrasound guided thoracentesis was thoroughly discussed with the patient and questions answered. The benefits, risks, alternatives and complications were also discussed. The patient understands and wishes to proceed with the procedure. Written consent was obtained. Ultrasound was performed to localize and mark an adequate pocket of fluid in the right chest. The area was then prepped and draped in the normal sterile fashion. 1% Lidocaine was used for local anesthesia. Under ultrasound guidance a Safe-T-Centesis catheter was introduced. Thoracentesis was performed. The catheter was removed and a dressing applied. FINDINGS: A total of approximately 1.1 liters of yellow fluid was removed. IMPRESSION: Successful ultrasound guided therapeutic right thoracentesis yielding 1.1 liters of pleural fluid. Read by: Rowe Robert, PA-C Electronically Signed   By: Jerilynn Mages.  Shick M.D.   On: 12/20/2015 16:50        Scheduled Meds: . citalopram  10 mg Oral Daily  . enoxaparin (LOVENOX) injection  40 mg Subcutaneous Q24H  . Influenza vac split quadrivalent PF  0.5 mL Intramuscular Tomorrow-1000  . insulin aspart  0-20 Units Subcutaneous TID WC  . insulin aspart  0-5 Units Subcutaneous QHS  . insulin aspart  3 Units Subcutaneous TID WC  . levothyroxine  112 mcg Oral 2 times per day  . mirtazapine  30 mg Oral QHS  . predniSONE  40 mg Oral QAC breakfast  . sodium chloride flush  10-40 mL Intracatheter Q12H  . vitamin B-12  1,000 mcg Oral Daily   Continuous Infusions:   LOS: 1 day    Time spent: 13 min    Chidinma Clites, MD [facility] Pager [phone]  If 7PM-7AM, please contact  night-coverage [email or web address] Password [email or web address] 12/21/2015, 1:22 PM

## 2015-12-21 NOTE — Progress Notes (Addendum)
Shift event: Dr. Karleen Hampshire of [facility] reported off on pt at shift change. Pt being transferred to SDU for closer monitoring secondary to SOB and recurrent malignant pleural effusions. CT chest ordered. R/p thoracentesis hopefully in am, order placed. Later, NP to bedside to check on pt. S: Pt says she is SOB and has pain in back and chest area from the fluid. Felt very good immediately after thoracentesis on 11/8, but quickly back to SOB when fluid came back. No other complaints. O: Chronically ill appearing elderly WF in NAD. Alert, oriented, smiling, pleasant. Obviously fatigued. Can speak in a few word sentences, but becomes tired. O2 sats > 92 on 2-3L [place]. Other VSS. A/P: 1. Recurrent SSLC with metastasis and malignant pleural effusion, recurring. S/p chemo/radiation per oncology. Just discharged from hospital 11/3 after effusion/HCAP treatment.  S/p thoracentesis here on 12/20/15. Likely will need to use Bipap tonight for respiratory fatigue. Attending contacted IR who referred her to [place] who didn't answer tonight. For now, pt is stable and likely thoracentesis can wait til am. Hopefully, she is a candidate for a pleurex catheter as this would avoid repeat thoracenteses.  Discussed treatment plan with pt. She agrees and reaffirms DNI only. (yes to CPR, yes to Bipap, yes to meds, etc). Discussed fluid collection and treatment for that and how unfortunately with lung cancer, fluid can recur and sometimes quickly. She understands. With pt's permission, NP spoke to son as well at bedside. Discussed plan. He is aware of how sick his mother is. Considering palliative/hospice care. He asked about a "catheter" instead of repeat thoracenteses. Informed son that may be an option. Will be decided in am. Discussed bipap and he is already familiar with that.  Will follow closely tonight. Discussed plan with RN as well. CT chest is pending.  KJKG, NP [facility] Update: Pt has not required bipap. O2 sat 97% on 2-3L per [place]. CT  chest shows nothing new or acute.  KJKG, NP [facility]

## 2015-12-21 NOTE — Progress Notes (Signed)
PT Cancellation Note  Patient Details Name: Rachel Chapman MRN: [id number] DOB: 11-30-1940   Cancelled Treatment:     PT order received but eval deferred at request of pt and family.  Pt upset following bad news.  Will follow.   Tammie Ellsworth 12/21/2015, 2:10 PM

## 2015-12-21 NOTE — Clinical Social Work Note (Addendum)
Clinical Social Work Assessment  Patient Details  Name: Rachel Chapman MRN: [id number] Date of Birth: 1940-11-11  Date of referral:  12/21/15               Reason for consult:  Skilled Nursing Placement               Permission sought to share information with:  Facility Sport and exercise psychologist, Family Supports Permission granted to share information::  Yes, Release of Information Signed  Name::        Agency::   SNF  Relationship::   Son   Contact Information:   [phone]  Housing/Transportation Living arrangements for the past 2 months:  Single Family Home Source of Information:  Patient Patient Interpreter Needed:  None Criminal Activity/Legal Involvement Pertinent to Current Situation/Hospitalization:  No - Comment as needed Significant Relationships:  Adult Children Lives with:  Self Do you feel safe going back to the place where you live?  Yes Need for family participation in patient care:  Yes (Comment)  Care giving concerns:  Patient is concerned about her ability to care for self at home. She wants a skill nursing facility at discharge.     Social Worker assessment / plan:  LCSWA met with patient at bedside and explained reason for consult. She expressed not being able to bathe and complete other  ADL's at this time, without assistance. She reports feeling weak.  The patient granted LCSWA permission to talk with her Adult son about her discharge plan.  LCSWA will assist with patient disposition.  Physical therapy is pending.    Employment status:  Retired Forensic scientist:  Medicare PT Recommendations:  Not assessed at this time Information / Referral to community resources:  [place]  Patient/Family's Response to care:  Agreeable and responding well to care   Patient/Family's Understanding of and Emotional Response to Diagnosis, Current Treatment, and Prognosis: " I cannot go home, I need to go to a nursing home."  Emotional  Assessment Appearance:  Appears stated age Attitude/Demeanor/Rapport:    Affect (typically observed):  Accepting, Calm Orientation:  Oriented to Self, Oriented to Place, Oriented to Situation Alcohol / Substance use:  Not Applicable Psych involvement (Current and /or in the community):  No (Comment)  Discharge Needs  Concerns to be addressed:  Care Coordination, Discharge Planning Concerns Readmission within the last 30 days:    Current discharge risk:  Dependent with Mobility Barriers to Discharge:  Continued Medical Work up   [employer], LCSW 12/21/2015, 10:48 AM

## 2015-12-22 ENCOUNTER — Inpatient Hospital Stay (HOSPITAL_COMMUNITY): Payer: Medicare Other

## 2015-12-22 DIAGNOSIS — D649 Anemia, unspecified: Secondary | ICD-10-CM

## 2015-12-22 DIAGNOSIS — J910 Malignant pleural effusion: Secondary | ICD-10-CM

## 2015-12-22 DIAGNOSIS — E43 Unspecified severe protein-calorie malnutrition: Secondary | ICD-10-CM

## 2015-12-22 DIAGNOSIS — R0600 Dyspnea, unspecified: Secondary | ICD-10-CM

## 2015-12-22 DIAGNOSIS — C3491 Malignant neoplasm of unspecified part of right bronchus or lung: Secondary | ICD-10-CM

## 2015-12-22 DIAGNOSIS — Z7189 Other specified counseling: Secondary | ICD-10-CM

## 2015-12-22 DIAGNOSIS — Z515 Encounter for palliative care: Secondary | ICD-10-CM

## 2015-12-22 DIAGNOSIS — L8990 Pressure ulcer of unspecified site, unspecified stage: Secondary | ICD-10-CM | POA: Insufficient documentation

## 2015-12-22 LAB — BASIC METABOLIC PANEL
ANION GAP: 4 — AB (ref 5–15)
BUN: 24 mg/dL — ABNORMAL HIGH (ref 6–20)
CHLORIDE: 100 mmol/L — AB (ref 101–111)
CO2: 32 mmol/L (ref 22–32)
Calcium: 8.2 mg/dL — ABNORMAL LOW (ref 8.9–10.3)
Creatinine, Ser: 1.09 mg/dL — ABNORMAL HIGH (ref 0.44–1.00)
GFR calc non Af Amer: 48 mL/min — ABNORMAL LOW (ref >60)
GFR, EST AFRICAN AMERICAN: 55 mL/min — AB (ref >60)
GLUCOSE: 335 mg/dL — AB (ref 65–99)
POTASSIUM: 4.7 mmol/L (ref 3.5–5.1)
Sodium: 136 mmol/L (ref 135–145)

## 2015-12-22 LAB — GLUCOSE, CAPILLARY
GLUCOSE-CAPILLARY: 150 mg/dL — AB (ref 65–99)
Glucose-Capillary: 327 mg/dL — ABNORMAL HIGH (ref 65–99)

## 2015-12-22 LAB — CBC WITH DIFFERENTIAL/PLATELET
BASOS ABS: 0 10*3/uL (ref 0.0–0.1)
BASOS PCT: 0 %
Eosinophils Absolute: 0.2 10*3/uL (ref 0.0–0.7)
Eosinophils Relative: 2 %
HEMATOCRIT: 30.5 % — AB (ref 36.0–46.0)
HEMOGLOBIN: 9.7 g/dL — AB (ref 12.0–15.0)
LYMPHS PCT: 4 %
Lymphs Abs: 0.4 10*3/uL — ABNORMAL LOW (ref 0.7–4.0)
MCH: 32.2 pg (ref 26.0–34.0)
MCHC: 31.8 g/dL (ref 30.0–36.0)
MCV: 101.3 fL — AB (ref 78.0–100.0)
MONO ABS: 0.6 10*3/uL (ref 0.1–1.0)
Monocytes Relative: 5 %
NEUTROS ABS: 9.5 10*3/uL — AB (ref 1.7–7.7)
NEUTROS PCT: 89 %
Platelets: 163 10*3/uL (ref 150–400)
RBC: 3.01 MIL/uL — AB (ref 3.87–5.11)
RDW: 15.4 % (ref 11.5–15.5)
WBC: 10.6 10*3/uL — ABNORMAL HIGH (ref 4.0–10.5)

## 2015-12-22 MED ORDER — LORAZEPAM 1 MG PO TABS: 1.0000 mg | ORAL_TABLET | ORAL | Status: DC | PRN

## 2015-12-22 MED ORDER — ONDANSETRON 4 MG PO TBDP: 4.0000 mg | ORAL_TABLET | Freq: Four times a day (QID) | ORAL | Status: DC | PRN

## 2015-12-22 MED ORDER — SODIUM CHLORIDE 0.9% FLUSH: 3.0000 mL | INTRAVENOUS | Status: DC | PRN

## 2015-12-22 MED ORDER — INSULIN ASPART 100 UNIT/ML ~~LOC~~ SOLN: 0.0000 [IU] | Freq: Three times a day (TID) | SUBCUTANEOUS | Status: DC

## 2015-12-22 MED ORDER — SODIUM CHLORIDE 0.9 % IV SOLN: 250.0000 mL | INTRAVENOUS | Status: DC | PRN

## 2015-12-22 MED ORDER — ACETAMINOPHEN 650 MG RE SUPP: 650.0000 mg | Freq: Four times a day (QID) | RECTAL | Status: DC | PRN

## 2015-12-22 MED ORDER — HALOPERIDOL 0.5 MG PO TABS
0.5000 mg | ORAL_TABLET | ORAL | Status: DC | PRN
  Filled 2015-12-22: qty 1

## 2015-12-22 MED ORDER — SODIUM CHLORIDE 0.9% FLUSH
3.0000 mL | Freq: Two times a day (BID) | INTRAVENOUS | Status: DC
Start: 2015-12-22 — End: 2015-12-27
  Administered 2015-12-23 – 2015-12-26 (×3): 3 mL via INTRAVENOUS

## 2015-12-22 MED ORDER — MORPHINE SULFATE (CONCENTRATE) 10 MG/0.5ML PO SOLN
5.0000 mg | ORAL | Status: AC
  Administered 2015-12-22: 5 mg via SUBLINGUAL
  Filled 2015-12-22: qty 0.5

## 2015-12-22 MED ORDER — MORPHINE SULFATE (PF) 2 MG/ML IV SOLN
2.0000 mg | INTRAVENOUS | Status: DC | PRN
  Administered 2015-12-22: 2 mg via INTRAVENOUS
  Filled 2015-12-22: qty 1

## 2015-12-22 MED ORDER — ACETAMINOPHEN 325 MG PO TABS: 650.0000 mg | ORAL_TABLET | Freq: Four times a day (QID) | ORAL | Status: DC | PRN

## 2015-12-22 MED ORDER — MORPHINE SULFATE (CONCENTRATE) 10 MG/0.5ML PO SOLN
5.0000 mg | ORAL | Status: DC | PRN
  Administered 2015-12-25 (×2): 5 mg via SUBLINGUAL
  Filled 2015-12-22 (×2): qty 0.5

## 2015-12-22 MED ORDER — INSULIN ASPART 100 UNIT/ML ~~LOC~~ SOLN: 0.0000 [IU] | Freq: Every day | SUBCUTANEOUS | Status: DC

## 2015-12-22 MED ORDER — TRAZODONE HCL 50 MG PO TABS: 25.0000 mg | ORAL_TABLET | Freq: Every evening | ORAL | Status: DC | PRN

## 2015-12-22 MED ORDER — SODIUM CHLORIDE 0.9 % IV BOLUS (SEPSIS): 500.0000 mL | Freq: Once | INTRAVENOUS | Status: DC

## 2015-12-22 MED ORDER — LORAZEPAM 2 MG/ML PO CONC
1.0000 mg | ORAL | Status: DC | PRN
  Administered 2015-12-25: 1 mg via SUBLINGUAL
  Filled 2015-12-22: qty 1

## 2015-12-22 MED ORDER — HALOPERIDOL LACTATE 2 MG/ML PO CONC
0.5000 mg | ORAL | Status: DC | PRN
  Filled 2015-12-22: qty 0.3

## 2015-12-22 MED ORDER — ONDANSETRON HCL 4 MG/2ML IJ SOLN: 4.0000 mg | Freq: Four times a day (QID) | INTRAMUSCULAR | Status: DC | PRN

## 2015-12-22 MED ORDER — INSULIN ASPART 100 UNIT/ML ~~LOC~~ SOLN: 3.0000 [IU] | Freq: Three times a day (TID) | SUBCUTANEOUS | Status: DC

## 2015-12-22 MED ORDER — GLYCOPYRROLATE 0.2 MG/ML IJ SOLN
0.1000 mg | INTRAMUSCULAR | Status: AC
  Administered 2015-12-22: 0.1 mg via INTRAVENOUS
  Filled 2015-12-22: qty 0.5

## 2015-12-22 MED ORDER — DIPHENHYDRAMINE HCL 50 MG/ML IJ SOLN: 12.5000 mg | INTRAMUSCULAR | Status: DC | PRN

## 2015-12-22 MED ORDER — LORAZEPAM 2 MG/ML IJ SOLN: 1.0000 mg | INTRAMUSCULAR | Status: DC | PRN

## 2015-12-22 MED ORDER — HALOPERIDOL LACTATE 5 MG/ML IJ SOLN: 0.5000 mg | INTRAMUSCULAR | Status: DC | PRN

## 2015-12-22 MED ORDER — SODIUM CHLORIDE 0.9 % IV SOLN
INTRAVENOUS | Status: DC
Start: 2015-12-22 — End: 2015-12-27
  Administered 2015-12-22: 13:00:00 via INTRAVENOUS

## 2015-12-22 NOTE — Progress Notes (Signed)
PROGRESS NOTE    Rachel Chapman  HFW:[id number] DOB: 1940-09-02 DOA: 12/20/2015 PCP: [name], NP    Brief Narrative: Rachel Chapman is a 75 y.o. female with medical history significant of SCLC s/p radiation and chemo,COPD, DM type 2,HTN, HLD, hypothyroidism, anemia, and anxiety, recently discharged from the hospital on 11/3 after being treated for right pleural effusion/ hcap, presents today for worsening sob. She had a  recent PET 10/11/15 showing new mediastinal lymphadenopathy consistent with recurrent carcinoma-. She underwent a lymph node biopsy on 11/3, shows metastatic small cell lung carcinoma. She underwent thoracentesis on 11/8 and fluid sent for analysis.   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 Fluid sent for analysis.  Worsening breathing on 11/9, repeat CXR, complete opacification on the right, .  Transferred the pateint to step down, ordered CT chest showed  Moderate large right-sided pleural effusion, increased compared to prior with interval progression of diffuse airspace consolidation throughout the right thorax. Previously noted large perihilar lung mass is difficult to separate from adjacent consolidated lung. The right bronchus is severely narrowed by tumor. There is narrowing of the SVC by adenopathy and mass lesion. Extensive mediastinal and supraclavicular adenopathy . Small left sided pleural effusion, and small to moderate pericardial effusion. Discussed wth results with the patient and the family and Dr Irene Limbo her oncologist. Palliative care consulted and pt and family wanted to consider home hospice  and the patient did not want any aggressive measures.  They have decided to pursue comfort measures for dignity and quality of life, but would like a pleurex catheter placement for recurrent effusions. Request IR consult to see if it can be done.  Request for repeat US thoracentesis today as repeat scan showed moderate to large effusion.    Metastatic small cell lung ca:  Further management as per Dr Irene Limbo.  Suspect she will not tolerate chemo at this point.    Severe protein calorie malnutrition.    Acute kidney injury: Unclear etiology.  dehydration ? Gentle hydration  Repeat renal parameters in am show improvement.    Hypotension, tachycardia: Tachycardia, from increased work of breathing, suspect low bp from narcotic pain  Meds.    Diabetes mellitus: CBG (last 3)   Recent Labs  12/21/15 2306 12/22/15 0748 12/22/15 1210  GLUCAP 203* 150* 327*    On resistant SSI we will probably d/c checking cbgs' on discharge.    DVT prophylaxis: (Lovenox/ Code Status: DNR Family Communication: multiple family members at bedside.  Disposition Plan: HOME hospice evntually.    Consultants:   Oncology.   Palliative care  IR.    Procedures: thoracentesis.   Antimicrobials: none.    Subjective: BREATHING is worse.   Objective: Vitals:   12/22/15 1000 12/22/15 1039 12/22/15 1100 12/22/15 1200  BP: (!) 109/36 (!) 113/53 (!) 97/47   Pulse: (!) 109 (!) 111 (!) 127   Resp: (!) 22 (!) 23 20   Temp:    98.1 F (36.7 C)  TempSrc:    Oral  SpO2: 100% 96% (!) 81%   Weight:      Height:  Intake/Output Summary (Last 24 hours) at 12/22/15 1256 Last data filed at 12/22/15 0908  Gross per 24 hour  Intake              490 ml  Output                0 ml  Net              490 ml   Filed Weights   12/20/15 2345 12/21/15 1649  Weight: 69.9 kg (154 lb) 73.4 kg (161 lb 14.4 oz)    Examination:  General exam: in distress.  Respiratory system: bilateral rhonchi,  diminished on the right.  Cardiovascular system: tachycardic. , No pedal edema. Gastrointestinal system: Abdomen is nondistended, soft and nontender. No organomegaly or masses felt. Normal bowel sounds heard. Central nervous system: Alert and oriented. No focal neurological deficits. Extremities: Symmetric 5 x 5 power. Skin: No rashes, lesions or ulcers      Data Reviewed: I have personally reviewed following labs and imaging studies  CBC:  Recent Labs Lab 12/16/15 0545 12/20/15 1536  WBC 12.0* 8.7  NEUTROABS  --  7.7  HGB 10.3* 10.4*  HCT 31.7* 32.5*  MCV 100.6* 101.9*  PLT 186 195   Basic Metabolic Panel:  Recent Labs Lab 12/16/15 0545 12/20/15 1536 12/21/15 1300 12/22/15 1153  NA 138 138 137 136  K 4.2 4.7 4.7 4.7  CL 101 101 100* 100*  CO2 30 34* 31 32  GLUCOSE 128* 341* 475* 335*  BUN 38* 22* 30* 24*  CREATININE 0.97 0.83 1.28* 1.09*  CALCIUM 8.4* 8.5* 8.3* 8.2*   GFR: Estimated Creatinine Clearance: 41.5 mL/min (by C-G formula based on SCr of 1.09 mg/dL (H)). Liver Function Tests:  Recent Labs Lab 12/20/15 1536  AST 14*  ALT 28  ALKPHOS 63  BILITOT 0.6  PROT 6.1*  ALBUMIN 3.4*   No results for input(s): LIPASE, AMYLASE in the last 168 hours. No results for input(s): AMMONIA in the last 168 hours. Coagulation Profile: No results for input(s): INR, PROTIME in the last 168 hours. Cardiac Enzymes:  Recent Labs Lab 12/21/15 0535  CKTOTAL 24*   BNP (last 3 results) No results for input(s): PROBNP in the last 8760 hours. HbA1C: No results for input(s): HGBA1C in the last 72 hours. CBG:  Recent Labs Lab 12/21/15 1621 12/21/15 1701 12/21/15 2306 12/22/15 0748 12/22/15 1210  GLUCAP 392* 348* 203* 150* 327*   Lipid Profile: No results for input(s): CHOL, HDL, LDLCALC, TRIG, CHOLHDL, LDLDIRECT in the last 72 hours. Thyroid Function Tests: No results for input(s): TSH, T4TOTAL, FREET4, T3FREE, THYROIDAB in the last 72 hours. Anemia  Panel: No results for input(s): VITAMINB12, FOLATE, FERRITIN, TIBC, IRON, RETICCTPCT in the last 72 hours. Sepsis Labs:  Recent Labs Lab 12/20/15 1549  LATICACIDVEN 0.83    Recent Results (from the past 240 hour(s))  Body fluid culture (includes gram stain)     Status: None (Preliminary result)   Collection Time: 12/20/15  8:07 PM  Result Value Ref Range Status   Specimen Description FLUID RIGHT PLEURAL  Final   Special Requests NONE  Final   Gram Stain   Final    ABUNDANT WBC PRESENT, PREDOMINANTLY MONONUCLEAR NO ORGANISMS SEEN    Culture   Final    NO GROWTH 2 DAYS Performed at [facility]    Report Status PENDING  Incomplete         Radiology Studies: Dg Chest 2 View  Result Date:  12/21/2015 CLINICAL DATA:  Chest pain and shortness of Breath, history of lung carcinoma EXAM: CHEST  2 VIEW COMPARISON:  12/20/2015 FINDINGS: Cardiac shadow is stable. Right chest wall port is again seen. There is now complete opacification of the right hemi thorax increased from the prior exam likely related to pleural effusion as well as consolidation in the right lung. The left lung remains clear. No acute bony abnormality is noted. Prior fracture of the proximal right humerus is seen. IMPRESSION: Significant opacification of the right hemi thorax increased from the previous day. This is likely related to a combination of consolidation and pleural effusion. Stable right humeral fracture Electronically Signed   By: Inez Catalina M.D.   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 Presents with worsening shortness of breath EXAM: CT CHEST WITH CONTRAST TECHNIQUE: Multidetector CT imaging of the chest was performed during intravenous contrast administration. CONTRAST:  57m ISOVUE-300 IOPAMIDOL (ISOVUE-300) INJECTION 61% COMPARISON:  12/13/2015, 09/14/2015 FINDINGS: Cardiovascular:  Atherosclerosis of the aorta. Aorta is non aneurysmal. There are coronary artery calcifications. There is a small small to moderate pericardial effusion, similar compared to previous exam. Central venous catheter is present on the right, the tip terminates in the distal SVC. There is severe narrowed appearance of the superior vena cava. Mediastinum/Nodes: Again visualized are multiple enlarged supraclavicular lymph nodes. Current exam does not image as superiorly as the prior study. Index right paratracheal lymph node measures 1.7 cm compared with 1.6 cm previously. Series 2, image number 28. Index prevascular lymph node measures 2.2 cm, series 2, image number 33, compared with 2.2 cm previously. No axillary adenopathy. Trachea is midline. There is severe narrowing of the distal right mainstem bronchus. The esophagus is grossly unremarkable. Enlarged lymph node anterior to the distal esophagus measures 1.5 cm compare with 1.4 cm previously, series 2, image number 98. Lungs/Pleura: There is a small left-sided pleural effusion, this is increased. There is a moderate right-sided pleural effusion, also slightly increased. There is interval increase in diffuse consolidation throughout the right lung with minimal peripheral aerated lung noted. A large right perihilar lung mass is again visualized but difficult to separate from adjacent consolidated lung. Portions of the mass appear contiguous with ill-defined adenopathy in the pretracheal space. There is severe narrowing of the right bronchus by tumor. Upper Abdomen: Multiple calcified gallstones. Adrenal glands within normal limits. Kidneys appear atrophic. Musculoskeletal: Bones are osteopenic. No acute osseous abnormality. Severe chronic deformity of the proximal right humerus. IMPRESSION: 1. Moderate large right-sided pleural effusion, increased compared to prior with interval progression of diffuse airspace consolidation throughout the right thorax. Previously noted  large perihilar lung mass is difficult to separate from adjacent consolidated lung. The right bronchus is severely narrowed by tumor. There is narrowing of the SVC by adenopathy and mass lesion. 2. Extensive mediastinal and supraclavicular adenopathy, grossly unchanged. 3. New small left-sided pleural effusion. Stable small focus of scarring in the apical left upper lobe. 4. Small to moderate pericardial effusion 5. Gallstones Electronically Signed   By: KDonavan FoilM.D.   On: 12/21/2015 23:10   Dg Chest Port 1 View  Result Date: 12/20/2015 CLINICAL DATA:  Post right thoracentesis, lung cancer EXAM: PORTABLE CHEST 1 VIEW COMPARISON:  12/20/2015 FINDINGS: Cardiomediastinal silhouette is stable. Persistent consolidation in the right upper hemi thorax. There is improvement in aeration right mid and lower lung. Residual right basilar  atelectasis or infiltrate. No pneumothorax. Left lung is clear. Again noted old fracture deformity of right proximal humerus. Stable right IJ Port-A-Cath position IMPRESSION: Persistent consolidation in the right upper hemi thorax. There is improvement in aeration right mid and lower lung. Residual right basilar atelectasis or infiltrate. No pneumothorax. Left lung is clear. Electronically Signed   By: Lahoma Crocker M.D.   On: 12/20/2015 17:00   Dg Chest Port 1 View  Result Date: 12/20/2015 CLINICAL DATA:  Increased shortness of breath after recently being discharged with pneumonia. EXAM: PORTABLE CHEST 1 VIEW COMPARISON:  12/11/2015 FINDINGS: A right jugular Port-A-Cath remains in place with tip overlying the lower SVC. The right heart border is obscured. The visualized cardiac silhouette is unchanged. Aortic atherosclerosis is noted. There is new complete opacification of the right hemithorax without significant mediastinal shift. No evidence of left-sided lung consolidation or left pleural effusion. No pneumothorax. Proximal right humerus fracture again noted. IMPRESSION: 1. New  complete opacification of the right hemithorax consistent with re-accumulation of large pleural effusion. 2. Aortic atherosclerosis. Electronically Signed   By: Logan Bores M.D.   On: 12/20/2015 15:25   US Thoracentesis Asp Pleural Space W/img Guide  Result Date: 12/20/2015 INDICATION: Metastatic small cell lung cancer, dyspnea, recurrent right pleural effusion. Request made for therapeutic right thoracentesis. EXAM: ULTRASOUND GUIDED THERAPEUTIC RIGHT THORACENTESIS MEDICATIONS: None. COMPLICATIONS: None immediate. PROCEDURE: An ultrasound guided thoracentesis was thoroughly discussed with the patient and questions answered. The benefits, risks, alternatives and complications were also discussed. The patient understands and wishes to proceed with the procedure. Written consent was obtained. Ultrasound was performed to localize and mark an adequate pocket of fluid in the right chest. The area was then prepped and draped in the normal sterile fashion. 1% Lidocaine was used for local anesthesia. Under ultrasound guidance a Safe-T-Centesis catheter was introduced. Thoracentesis was performed. The catheter was removed and a dressing applied. FINDINGS: A total of approximately 1.1 liters of yellow fluid was removed. IMPRESSION: Successful ultrasound guided therapeutic right thoracentesis yielding 1.1 liters of pleural fluid. Read by: Rowe Robert, PA-C Electronically Signed   By: Jerilynn Mages.  Shick M.D.   On: 12/20/2015 16:50        Scheduled Meds: . citalopram  10 mg Oral Daily  . enoxaparin (LOVENOX) injection  40 mg Subcutaneous Q24H  . insulin aspart  0-20 Units Subcutaneous TID WC  . insulin aspart  0-5 Units Subcutaneous QHS  . insulin aspart  3 Units Subcutaneous TID WC  . ipratropium-albuterol  3 mL Nebulization Q6H  . levothyroxine  112 mcg Oral 2 times per day  . mouth rinse  15 mL Mouth Rinse BID  . mirtazapine  30 mg Oral QHS  . predniSONE  40 mg Oral QAC breakfast  . sodium chloride  500 mL  Intravenous Once  . sodium chloride flush  10-40 mL Intracatheter Q12H  . vitamin B-12  1,000 mcg Oral Daily   Continuous Infusions:   LOS: 2 days    Time spent: 40 min    Damondre Pfeifle, MD [facility] Pager [phone]  If 7PM-7AM, please contact night-coverage [email or web address] Password [email or web address] 12/22/2015, 12:56 PM

## 2015-12-22 NOTE — Progress Notes (Signed)
Marland Kitchen   HEMATOLOGY/ONCOLOGY INPATIENT PROGRESS NOTE  Date of Service: 12/21/2015 Inpatient Attending: .Hosie Poisson, MD   SUBJECTIVE  Patient was seen with her son and daughter at bedside. She was readmitted soon after being discharged after her last hospitalization with weakness and increasing shortness of breath. Had a repeat thoracentesis which helped some with her breathing. She appears weak and is getting progressively bedbound. We discussed her goals of care and wanted meant to give palliative chemotherapy versus focusing on being at home with her family and grandkids which is more important to her. We discussed that getting palliative chemotherapy would likely mean requiring stay in the skilled nursing facility. Patient is overwhelmed the prospect of impending demise and worsening physical status. We discussed at length that ensure that she is always comfortable. She is okay with a palliative care consultation to help her further define goals of care. She remains undecided at this time despite medical discussions. She is having significant issues with anxiety and ongoing basis. Her son and daughter and I were present at bedside providing emotional support. Son and daughter are okay with the idea of palliative care consultation.   OBJECTIVE:  Anxious and somewhat emotionally distraught  PHYSICAL EXAMINATION: . Vitals:   12/22/15 1100 12/22/15 1200 12/22/15 1300 12/22/15 1430  BP: (!) 97/47 (!) 112/50 136/61 (!) 132/55  Pulse: (!) 127 (!) 105 (!) 109 87  Resp: '20 17 19 '$ (!) 24  Temp:  98.1 F (36.7 C)    TempSrc:  Oral    SpO2: (!) 81% 96% 96% 94%  Weight:      Height:       Filed Weights   12/20/15 2345 12/21/15 1649  Weight: 154 lb (69.9 kg) 161 lb 14.4 oz (73.4 kg)   .Body mass index is 28.68 kg/m.  GENERAL:alert,Anxious somewhat short of breath even at rest SKIN:  No acute rashes EYES: normal, conjunctiva are pink and non-injected, sclera clear OROPHARYNX: Moist mucous  membranes   NECK: supple, no JVD, thyroid normal size, non-tender, without nodularity LYMPH:  no palpable lymphadenopathy in the cervical, axillary or inguinal LUNGS: clear to auscultation with normal respiratory effort HEART: regular rate & rhythm,  no murmurs and no lower extremity edema ABDOMEN: abdomen soft, non-tender, normoactive bowel sounds  Musculoskeletal: no cyanosis of digits and no clubbing  PSYCH: alert & oriented x 3 with fluent speech NEURO: no focal motor/sensory deficits  MEDICAL HISTORY:  Past Medical History:  Diagnosis Date  . Anemia   . Anxiety   . Cancer ([place])   . Diabetes ([place])    Type II  . Dyslipidemia   . History of blood transfusion   . Hypertension   . Hypothyroidism   . Lung cancer ([place]) 8/216   hilar/mediastinum lung scca  . Pneumonia 2007  . Shortness of breath dyspnea    with exertion  . Smoker    1.5 packs per day for 60 years.  Quit one month ago    SURGICAL HISTORY: Past Surgical History:  Procedure Laterality Date  . CESAREAN SECTION    . EYE SURGERY Bilateral    Cataract    SOCIAL HISTORY: Social History   Social History  . Marital status: Widowed    Spouse name: N/A  . Number of children: N/A  . Years of education: N/A   Occupational History  . Not on file.   Social History Main Topics  . Smoking status: Former Smoker    Packs/day: 1.50    Years: 60.00  Quit date: 09/12/2014  . Smokeless tobacco: Never Used  . Alcohol use No  . Drug use: No  . Sexual activity: No   Other Topics Concern  . Not on file   Social History Narrative   She has moved from [place]   She is retired from working with special education    Was married widowed twice.           FAMILY HISTORY: Family History  Problem Relation Age of Onset  . Cancer Mother     lung non smoker  . Diabetes Mother   . Cancer Sister     colon  . Diabetes Sister   . Clotting disorder Neg Hx     ALLERGIES:  is allergic to tradjenta  [linagliptin].  MEDICATIONS:  Scheduled Meds: . citalopram  10 mg Oral Daily  . glycopyrrolate  0.1 mg Intravenous NOW  . ipratropium-albuterol  3 mL Nebulization Q6H  . levothyroxine  112 mcg Oral 2 times per day  . mouth rinse  15 mL Mouth Rinse BID  . mirtazapine  30 mg Oral QHS  . predniSONE  40 mg Oral QAC breakfast  . sodium chloride  500 mL Intravenous Once  . sodium chloride flush  10-40 mL Intracatheter Q12H  . sodium chloride flush  3 mL Intravenous Q12H   Continuous Infusions: . sodium chloride     PRN Meds:.sodium chloride, acetaminophen **OR** acetaminophen, albuterol, dextromethorphan-guaiFENesin, diphenhydrAMINE, haloperidol **OR** haloperidol **OR** haloperidol lactate, HYDROcodone-acetaminophen, LORazepam **OR** LORazepam **OR** LORazepam, morphine injection, morphine CONCENTRATE, ondansetron **OR** ondansetron (ZOFRAN) IV, sodium chloride flush, sodium chloride flush, traZODone  REVIEW OF SYSTEMS:    10 Point review of Systems was done is negative except as noted above.   LABORATORY DATA:  I have reviewed the data as listed  . CBC Latest Ref Rng & Units 12/22/2015 12/20/2015 12/16/2015  WBC 4.0 - 10.5 K/uL 10.6(H) 8.7 12.0(H)  Hemoglobin 12.0 - 15.0 g/dL 9.7(L) 10.4(L) 10.3(L)  Hematocrit 36.0 - 46.0 % 30.5(L) 32.5(L) 31.7(L)  Platelets 150 - 400 K/uL 163 157 186    . CMP Latest Ref Rng & Units 12/22/2015 12/21/2015 12/20/2015  Glucose 65 - 99 mg/dL 335(H) 475(H) 341(H)  BUN 6 - 20 mg/dL 24(H) 30(H) 22(H)  Creatinine 0.44 - 1.00 mg/dL 1.09(H) 1.28(H) 0.83  Sodium 135 - 145 mmol/L 136 137 138  Potassium 3.5 - 5.1 mmol/L 4.7 4.7 4.7  Chloride 101 - 111 mmol/L 100(L) 100(L) 101  CO2 22 - 32 mmol/L 32 31 34(H)  Calcium 8.9 - 10.3 mg/dL 8.2(L) 8.3(L) 8.5(L)  Total Protein 6.5 - 8.1 g/dL - - 6.1(L)  Total Bilirubin 0.3 - 1.2 mg/dL - - 0.6  Alkaline Phos 38 - 126 U/L - - 63  AST 15 - 41 U/L - - 14(L)  ALT 14 - 54 U/L - - 28     RADIOGRAPHIC STUDIES: I  have personally reviewed the radiological images as listed and agreed with the findings in the report. Dg Chest 2 View  Result Date: 12/21/2015 CLINICAL DATA:  Chest pain and shortness of Breath, history of lung carcinoma EXAM: CHEST  2 VIEW COMPARISON:  12/20/2015 FINDINGS: Cardiac shadow is stable. Right chest wall port is again seen. There is now complete opacification of the right hemi thorax increased from the prior exam likely related to pleural effusion as well as consolidation in the right lung. The left lung remains clear. No acute bony abnormality is noted. Prior fracture of the proximal right humerus is seen. IMPRESSION:  Significant opacification of the right hemi thorax increased from the previous day. This is likely related to a combination of consolidation and pleural effusion. Stable right humeral fracture Electronically Signed   By: Inez Catalina M.D.   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 Presents with worsening shortness of breath EXAM: CT CHEST WITH CONTRAST TECHNIQUE: Multidetector CT imaging of the chest was performed during intravenous contrast administration. CONTRAST:  35m ISOVUE-300 IOPAMIDOL (ISOVUE-300) INJECTION 61% COMPARISON:  12/13/2015, 09/14/2015 FINDINGS: Cardiovascular: Atherosclerosis of the aorta. Aorta is non aneurysmal. There are coronary artery calcifications. There is a small small to moderate pericardial effusion, similar compared to previous exam. Central venous catheter is present on the right, the tip terminates in the distal SVC. There is severe narrowed appearance of the superior vena cava. Mediastinum/Nodes: Again visualized are multiple enlarged supraclavicular lymph nodes. Current exam does not image as superiorly as the prior study. Index right paratracheal lymph node measures 1.7 cm compared with 1.6 cm previously. Series 2, image number 28.  Index prevascular lymph node measures 2.2 cm, series 2, image number 33, compared with 2.2 cm previously. No axillary adenopathy. Trachea is midline. There is severe narrowing of the distal right mainstem bronchus. The esophagus is grossly unremarkable. Enlarged lymph node anterior to the distal esophagus measures 1.5 cm compare with 1.4 cm previously, series 2, image number 98. Lungs/Pleura: There is a small left-sided pleural effusion, this is increased. There is a moderate right-sided pleural effusion, also slightly increased. There is interval increase in diffuse consolidation throughout the right lung with minimal peripheral aerated lung noted. A large right perihilar lung mass is again visualized but difficult to separate from adjacent consolidated lung. Portions of the mass appear contiguous with ill-defined adenopathy in the pretracheal space. There is severe narrowing of the right bronchus by tumor. Upper Abdomen: Multiple calcified gallstones. Adrenal glands within normal limits. Kidneys appear atrophic. Musculoskeletal: Bones are osteopenic. No acute osseous abnormality. Severe chronic deformity of the proximal right humerus. IMPRESSION: 1. Moderate large right-sided pleural effusion, increased compared to prior with interval progression of diffuse airspace consolidation throughout the right thorax. Previously noted large perihilar lung mass is difficult to separate from adjacent consolidated lung. The right bronchus is severely narrowed by tumor. There is narrowing of the SVC by adenopathy and mass lesion. 2. Extensive mediastinal and supraclavicular adenopathy, grossly unchanged. 3. New small left-sided pleural effusion. Stable small focus of scarring in the apical left upper lobe. 4. Small to moderate pericardial effusion 5. Gallstones Electronically Signed   By: KDonavan FoilM.D.   On: 12/21/2015 23:10   Ct Chest W Contrast  Result Date: 12/13/2015 CLINICAL DATA:  Lung cancer recurrence. EXAM: CT  CHEST WITH CONTRAST TECHNIQUE: Multidetector CT imaging of the chest was performed during intravenous contrast administration. CONTRAST:  72mISOVUE-300 IOPAMIDOL (ISOVUE-300) INJECTION 61% COMPARISON:  09/14/2015 FINDINGS: Cardiovascular: The heart size is normal. The heart size is normal. There is a new small pericardial effusion. Aortic atherosclerosis noted. Calcification within the LAD coronary artery is identified. Mediastinum/Nodes: The trachea appears patent and is midline. Normal appearance of the esophagus. Progression of mediastinal adenopathy. Index right paratracheal lymph node measures 1.6 cm, image 34 of series 2. Previously 1.0 cm. Index pre-vascular lymph node measures 2.2 cm, image 43 of series 2. Previously 0.9 cm. New bilateral supraclavicular adenopathy. Index right supraclavicular node, image number 12 of series  2. Measures 1.6 cm index left supraclavicular lymph node measures 1.4 cm. Lungs/Pleura: Large central right perihilar mass is identified and is new from previous exam compatible with local tumor recurrence. The mass measures 9 x 10.9 by 8.2 cm, image 62 of series 2. There is narrowing of the right lower lobe, right middle lobe and right upper lobe airways. There is also significant mass effect upon the branches of the right pulmonary artery. New right upper lobe pulmonary nodule measures 9 mm, image number 49 of series 4. Perifissural nodule within the right middle lobe measures 11 mm, image number 60 of series 4. New from previous exam. There is a moderate to large right pleural effusion which is new from previous exam. Upper Abdomen: Multiple small stones identified within the gallbladder. The adrenal glands appear normal. No acute findings noted within the upper abdomen. Musculoskeletal: The bones appear diffusely osteopenic. Chronic fracture deformity involving the proximal right humerus noted. IMPRESSION: 1. Examination positive for recurrence of disease. 2. Compared with  09/14/2015 there has been marked progression of mediastinal and bilateral supraclavicular nodal metastases. 3. Large right perihilar lung mass is new from the previous exam. 4. New right pleural effusion with perifissural nodularity suspicious for pleural spread of tumor. 5. Small pericardial effusion is new from previous exam. Cannot rule out malignant pericardial effusion. 6. Gallstones Electronically Signed   By: Kerby Moors M.D.   On: 12/13/2015 16:36   US Biopsy  Addendum Date: 12/20/2015   ADDENDUM REPORT: 12/20/2015 17:19 ADDENDUM: Addendum created to address the exam title. EXAM: ULTRASOUND GUIDED SUPRACLAVICULAR NODE BIOPSY Electronically Signed   By: Corrie Mckusick D.O.   On: 12/20/2015 17:19   Result Date: 12/20/2015 INDICATION: 75 year old female with a history of small cell carcinoma previously treated. Evidence of recurrence with new supraclavicular adenopathy. EXAM: ULTRASOUND BIOPSY CORE LIVER MEDICATIONS: None. ANESTHESIA/SEDATION: Moderate (conscious) sedation was employed during this procedure. A total of Versed 1.0 mg and Fentanyl 50 mcg was administered intravenously. Moderate Sedation Time: 10 minutes. The patient's level of consciousness and vital signs were monitored continuously by radiology nursing throughout the procedure under my direct supervision. FLUOROSCOPY TIME:  None COMPLICATIONS: None PROCEDURE: Informed written consent was obtained from the patient after a thorough discussion of the procedural risks, benefits and alternatives. All questions were addressed. Maximal Sterile Barrier Technique was utilized including caps, mask, sterile gowns, sterile gloves, sterile drape, hand hygiene and skin antiseptic. A timeout was performed prior to the initiation of the procedure. Patient positioned supine position on the ultrasound table, with ultrasound survey of the right supraclavicular region and images stored sent to PACs. The patient is then prepped and draped in the usual  sterile fashion. The skin and subcutaneous tissues were generously infiltrated 1% lidocaine for local anesthesia. Using ultrasound guidance, multiple core biopsy were achieved of target lymph node. Final image was stored. Patient tolerated the procedure well and remained hemodynamically stable throughout. No complications were encountered and no significant blood loss. IMPRESSION: Status post ultrasound-guided core biopsy of right supraclavicular node. Tissue specimen sent to pathology for complete histopathologic analysis. Signed, Dulcy Fanny. Earleen [place], DO [facility] Electronically Signed: By: Corrie Mckusick D.O. On: 12/15/2015 15:34   Dg Chest Port 1 View  Result Date: 12/20/2015 CLINICAL DATA:  Post right thoracentesis, lung cancer EXAM: PORTABLE CHEST 1 VIEW COMPARISON:  12/20/2015 FINDINGS: Cardiomediastinal silhouette is stable. Persistent consolidation in the right upper hemi thorax. There is improvement in aeration right mid and lower lung.  Residual right basilar atelectasis or infiltrate. No pneumothorax. Left lung is clear. Again noted old fracture deformity of right proximal humerus. Stable right IJ Port-A-Cath position IMPRESSION: Persistent consolidation in the right upper hemi thorax. There is improvement in aeration right mid and lower lung. Residual right basilar atelectasis or infiltrate. No pneumothorax. Left lung is clear. Electronically Signed   By: Lahoma Crocker M.D.   On: 12/20/2015 17:00   Dg Chest Port 1 View  Result Date: 12/20/2015 CLINICAL DATA:  Increased shortness of breath after recently being discharged with pneumonia. EXAM: PORTABLE CHEST 1 VIEW COMPARISON:  12/11/2015 FINDINGS: A right jugular Port-A-Cath remains in place with tip overlying the lower SVC. The right heart border is obscured. The visualized cardiac silhouette is unchanged. Aortic atherosclerosis is noted. There is new complete opacification of the right  hemithorax without significant mediastinal shift. No evidence of left-sided lung consolidation or left pleural effusion. No pneumothorax. Proximal right humerus fracture again noted. IMPRESSION: 1. New complete opacification of the right hemithorax consistent with re-accumulation of large pleural effusion. 2. Aortic atherosclerosis. Electronically Signed   By: Logan Bores M.D.   On: 12/20/2015 15:25   Dg Chest Port 1 View  Result Date: 12/11/2015 CLINICAL DATA:  75 year old female with a history of right-sided small cell carcinoma. Status post right-sided thoracentesis EXAM: PORTABLE CHEST 1 VIEW COMPARISON:  PET-CT 10/11/2015, chest x-ray 12/11/2015 1:16 a.m. FINDINGS: Cardiomediastinal silhouette unchanged. Dense opacity persists in the right hilar region. Calcifications of the aortic arch. Since the prior there is improved aeration on the right with improved visualization the right hemidiaphragm. No visualized pneumothorax. Trace thickening of the minor fissure persists. No evidence of left-sided pleural effusion. Unchanged position of right IJ port catheter. Right humeral fracture again noted, better seen on plain film of September 2017. IMPRESSION: Status post right-sided thoracentesis, with improved aeration and no visualized pneumothorax. Hilar opacity in this patient with known small cell carcinoma, may represent a combination of treatment effect, adenopathy, residual pleural fluid, atelectasis/consolidation, or parenchymal involvement. Unchanged right IJ port catheter. Re- demonstration of known right humerus fracture. Signed, Dulcy Fanny. Earleen [place], DO [facility] Rad[facility] Electronically Signed   By: Corrie Mckusick D.O.   On: 12/11/2015 11:04   Dg Chest Port 1 View  Result Date: 12/11/2015 CLINICAL DATA:  Acute onset of shortness of breath. Recently diagnosed with bronchitis. Initial encounter. EXAM: PORTABLE CHEST 1 VIEW COMPARISON:  PET/CT performed  10/11/2015 FINDINGS: A moderate right-sided pleural effusion is noted, with right-sided airspace opacity. This may reflect pneumonia. Underlying recurrent malignancy is a concern. The left lung appears relatively clear.  No pneumothorax is seen. The cardiomediastinal silhouette is enlarged. Right perihilar postradiation change is again noted. A right-sided chest port is noted ending about the mid SVC. No acute osseous abnormalities are seen. IMPRESSION: Moderate right-sided pleural effusion, with right-sided airspace opacity. This may reflect pneumonia. Underlying recurrent malignancy is a concern. Diagnostic thoracentesis could be considered for further evaluation, or PET/CT could be considered 3-4 weeks after completion of treatment for pneumonia. Electronically Signed   By: Garald Balding M.D.   On: 12/11/2015 01:35   US Thoracentesis Asp Pleural Space W/img Guide  Result Date: 12/20/2015 INDICATION: Metastatic small cell lung cancer, dyspnea, recurrent right pleural effusion. Request made for therapeutic right thoracentesis. EXAM: ULTRASOUND GUIDED THERAPEUTIC RIGHT THORACENTESIS MEDICATIONS: None. COMPLICATIONS: None immediate. PROCEDURE: An ultrasound guided thoracentesis was thoroughly discussed with the patient and questions answered. The benefits, risks, alternatives and complications were also  discussed. The patient understands and wishes to proceed with the procedure. Written consent was obtained. Ultrasound was performed to localize and mark an adequate pocket of fluid in the right chest. The area was then prepped and draped in the normal sterile fashion. 1% Lidocaine was used for local anesthesia. Under ultrasound guidance a Safe-T-Centesis catheter was introduced. Thoracentesis was performed. The catheter was removed and a dressing applied. FINDINGS: A total of approximately 1.1 liters of yellow fluid was removed. IMPRESSION: Successful ultrasound guided therapeutic right thoracentesis yielding 1.1  liters of pleural fluid. Read by: Rowe Robert, PA-C Electronically Signed   By: Jerilynn Mages.  Shick M.D.   On: 12/20/2015 16:50   US Thoracentesis Asp Pleural Space W/img Guide  Result Date: 12/11/2015 INDICATION: History of treated small cell lung cancer and recent diagnosis of bronchitis. Now admitted with dyspnea and a moderate right pleural effusion. Request is made for diagnostic and therapeutic thoracentesis. EXAM: ULTRASOUND GUIDED DIAGNOSTIC AND THERAPEUTIC THORACENTESIS MEDICATIONS: 1% lidocaine COMPLICATIONS: None immediate. PROCEDURE: An ultrasound guided thoracentesis was thoroughly discussed with the patient and questions answered. The benefits, risks, alternatives and complications were also discussed. The patient understands and wishes to proceed with the procedure. Written consent was obtained. Ultrasound was performed to localize and mark an adequate pocket of fluid in the right chest. The area was then prepped and draped in the normal sterile fashion. 1% Lidocaine was used for local anesthesia. Under ultrasound guidance a Safe-T-Centesis catheter was introduced. Thoracentesis was performed. The catheter was removed and a dressing applied. FINDINGS: A total of approximately 1 L of clear yellow fluid was removed. Samples were sent to the laboratory as requested by the clinical team. IMPRESSION: Successful ultrasound guided right thoracentesis yielding 1 L of pleural fluid. Read by: Saverio Danker, PA-C Electronically Signed   By: Jerilynn Mages.  Shick M.D.   On: 12/11/2015 10:48    ASSESSMENT & PLAN:   75 year old female with a performance status of 2 with  #1Recurrent small cell lung cancer. Patient was previously diagnosed with limited stage small cell lung cancer and was treated with concurrent carboplatin and etoposide with radiation therapy and was able to tolerate 5 cycles of treatment with complete remission. No known brain metastases previously. Completed prophylactic cranial irradiation. PET/CT  scan on 10/11/2015 with concern for new mediastinal FDG avid lymphadenopathy. MRI of the brain on 10/18/2015 was negative for metastatic disease CT scan of the chest on 12/13/2015 shows overt disease progression Biopsy of her supraclavicular lymph node confirms recurrent small cell lung cancer  Plan -Patient was readmitted with shortness of breath and recurrent pleural effusion and worsening lung involvement. -her ECOG PS is now bordering on 3. -We discussed her goals of care and what she sees his appropriate care for herself. Her anxiety is limiting her from making clear decisions. -We discussed what was important to her and if she would want to be in a skilled nursing facility while trying to attempt palliative chemotherapy that might make her weaker vs be Comfortable at home where she could spend already time with her children and grandchildren. -She does not like the idea being in a skilled nursing facility and would want to be at home but cannot handle symptoms by herself. -She appears to be leaning more towards going home with hospice. After getting the patient's and her daughter and son's permission consulted palliative care to further help define goals of care.  We shall continue to follow for support.  I spent 30 minutes counseling the patient face to  face. The total time spent in the appointment was 40 minutes and more than 50% was on counseling and direct patient cares.    Sullivan Lone MD [place] AAHIVMS [facility] [place] [facility] Hematology/Oncology Physician [facility]  (Office):       [phone] (Work cell):  [phone] (Fax):           [phone]  12/22/2015 3:17 PM

## 2015-12-22 NOTE — Progress Notes (Addendum)
Inpatient Diabetes Program Recommendations  AACE/ADA: New Consensus Statement on Inpatient Glycemic Control (2015)  Target Ranges:  Prepandial:   less than 140 mg/dL      Peak postprandial:   less than 180 mg/dL (1-2 hours)      Critically ill patients:  140 - 180 mg/dL   Results for [facility], Debrina (MRN [id number]) as of 12/22/2015 13:15  Ref. Range 12/21/2015 08:08 12/21/2015 11:39 12/21/2015 16:21 12/21/2015 17:01 12/21/2015 23:06  Glucose-Capillary Latest Ref Range: 65 - 99 mg/dL 240 (H) 412 (H) 392 (H) 348 (H) 203 (H)   Results for [facility], Keirra (MRN [id number]) as of 12/22/2015 13:15  Ref. Range 12/22/2015 07:48 12/22/2015 12:10  Glucose-Capillary Latest Ref Range: 65 - 99 mg/dL 150 (H) 327 (H)    Admit Recurrent Pleural Effusion.   History: DM, COPD, Lung Cancer  Home DM Meds: Januvia 100 mg daily  Current Orders: Novolog Resistant Correction Scale/ SSI (0-20 units) TID AC + HS      Novolog 3 units TIDWC       -Fasting glucose improved this AM (CBG 150 mg/dl), however, patient having significant elevated postprandial glucose levels.  -Glucose elevations likely caused by Prednisone 40 mg daily.  -Patient eating 100% of meals.    MD- Please consider the following in-hospital insulin adjustments (if within goals of care for this patient):  1. Increase Novolog Meal Coverage to: Novolog 5 units TIDWC  2. Change PO diet to Carbohydrate Modified diet (currently ordered as Regular diet with no Carbohydrate restrictions)       --Will follow patient during hospitalization--  Wyn Quaker RN, MSN, CDE Diabetes Coordinator Inpatient Glycemic Control Team Team Pager: [phone] (8a-5p)

## 2015-12-22 NOTE — Progress Notes (Addendum)
Family members is concern about patient been having narcotics for pain and is requesting for stool softener if necessary for the patient. Paged and notified on cal provider.

## 2015-12-22 NOTE — Progress Notes (Signed)
[facility] Kitchen   HEMATOLOGY/ONCOLOGY INPATIENT PROGRESS NOTE  Date of Service: 12/22/2015 Inpatient Attending: .Hosie Poisson, MD   SUBJECTIVE  Patient was seen with her daughter at bedside. She was transferred to step down unit for increasing shortness of breath. Seen by palliative care. Has decided to pursue best supportive cares through hospice.  OBJECTIVE:  A little more relaxed today.  PHYSICAL EXAMINATION: . Vitals:   12/22/15 1100 12/22/15 1200 12/22/15 1300 12/22/15 1430  BP: (!) 97/47 (!) 112/50 136/61 (!) 132/55  Pulse: (!) 127 (!) 105 (!) 109 87  Resp: '20 17 19 '$ (!) 24  Temp:  98.1 F (36.7 C)    TempSrc:  Oral    SpO2: (!) 81% 96% 96% 94%  Weight:      Height:       Filed Weights   12/20/15 2345 12/21/15 1649  Weight: 154 lb (69.9 kg) 161 lb 14.4 oz (73.4 kg)   .Body mass index is 28.68 kg/m.  GENERAL:alert,more relaxed today. SKIN:  No acute rashes EYES: normal, conjunctiva are pink and non-injected, sclera clear OROPHARYNX: Moist mucous membranes   NECK: supple, no JVD, thyroid normal size, non-tender, without nodularity LYMPH:  no palpable lymphadenopathy in the cervical, axillary or inguinal LUNGS: decreased air entry rt lung and left base. HEART: regular rate & rhythm,  no murmurs and no lower extremity edema ABDOMEN: abdomen soft, non-tender, normoactive bowel sounds  Musculoskeletal: no cyanosis of digits and no clubbing  PSYCH: alert & oriented x 3 with fluent speech NEURO: no focal motor/sensory deficits  MEDICAL HISTORY:  Past Medical History:  Diagnosis Date  . Anemia   . Anxiety   . Cancer ([place])   . Diabetes ([place])    Type II  . Dyslipidemia   . History of blood transfusion   . Hypertension   . Hypothyroidism   . Lung cancer ([place]) 8/216   hilar/mediastinum lung scca  . Pneumonia 2007  . Shortness of breath dyspnea    with exertion  . Smoker    1.5 packs per day for 60 years.  Quit one month ago    SURGICAL HISTORY: Past Surgical  History:  Procedure Laterality Date  . CESAREAN SECTION    . EYE SURGERY Bilateral    Cataract    SOCIAL HISTORY: Social History   Social History  . Marital status: Widowed    Spouse name: N/A  . Number of children: N/A  . Years of education: N/A   Occupational History  . Not on file.   Social History Main Topics  . Smoking status: Former Smoker    Packs/day: 1.50    Years: 60.00    Quit date: 09/12/2014  . Smokeless tobacco: Never Used  . Alcohol use No  . Drug use: No  . Sexual activity: No   Other Topics Concern  . Not on file   Social History Narrative   She has moved from [place]   She is retired from working with special education    Was married widowed twice.           FAMILY HISTORY: Family History  Problem Relation Age of Onset  . Cancer Mother     lung non smoker  . Diabetes Mother   . Cancer Sister     colon  . Diabetes Sister   . Clotting disorder Neg Hx     ALLERGIES:  is allergic to tradjenta [linagliptin].  MEDICATIONS:  Scheduled Meds: . citalopram  10 mg Oral Daily  . ipratropium-albuterol  3 mL Nebulization Q6H  . levothyroxine  112 mcg Oral 2 times per day  . mouth rinse  15 mL Mouth Rinse BID  . mirtazapine  30 mg Oral QHS  . predniSONE  40 mg Oral QAC breakfast  . sodium chloride  500 mL Intravenous Once  . sodium chloride flush  10-40 mL Intracatheter Q12H  . sodium chloride flush  3 mL Intravenous Q12H   Continuous Infusions: . sodium chloride 10 mL/hr at 12/22/15 1300   PRN Meds:.sodium chloride, acetaminophen **OR** acetaminophen, albuterol, dextromethorphan-guaiFENesin, diphenhydrAMINE, haloperidol **OR** haloperidol **OR** haloperidol lactate, HYDROcodone-acetaminophen, LORazepam **OR** LORazepam **OR** LORazepam, morphine injection, morphine CONCENTRATE, ondansetron **OR** ondansetron (ZOFRAN) IV, sodium chloride flush, sodium chloride flush, traZODone  REVIEW OF SYSTEMS:    10 Point review of Systems was done is negative  except as noted above.   LABORATORY DATA:  I have reviewed the data as listed  . CBC Latest Ref Rng & Units 12/22/2015 12/20/2015 12/16/2015  WBC 4.0 - 10.5 K/uL 10.6(H) 8.7 12.0(H)  Hemoglobin 12.0 - 15.0 g/dL 9.7(L) 10.4(L) 10.3(L)  Hematocrit 36.0 - 46.0 % 30.5(L) 32.5(L) 31.7(L)  Platelets 150 - 400 K/uL 163 157 186    . CMP Latest Ref Rng & Units 12/22/2015 12/21/2015 12/20/2015  Glucose 65 - 99 mg/dL 335(H) 475(H) 341(H)  BUN 6 - 20 mg/dL 24(H) 30(H) 22(H)  Creatinine 0.44 - 1.00 mg/dL 1.09(H) 1.28(H) 0.83  Sodium 135 - 145 mmol/L 136 137 138  Potassium 3.5 - 5.1 mmol/L 4.7 4.7 4.7  Chloride 101 - 111 mmol/L 100(L) 100(L) 101  CO2 22 - 32 mmol/L 32 31 34(H)  Calcium 8.9 - 10.3 mg/dL 8.2(L) 8.3(L) 8.5(L)  Total Protein 6.5 - 8.1 g/dL - - 6.1(L)  Total Bilirubin 0.3 - 1.2 mg/dL - - 0.6  Alkaline Phos 38 - 126 U/L - - 63  AST 15 - 41 U/L - - 14(L)  ALT 14 - 54 U/L - - 28     RADIOGRAPHIC STUDIES: I have personally reviewed the radiological images as listed and agreed with the findings in the report. Dg Chest 2 View  Result Date: 12/21/2015 CLINICAL DATA:  Chest pain and shortness of Breath, history of lung carcinoma EXAM: CHEST  2 VIEW COMPARISON:  12/20/2015 FINDINGS: Cardiac shadow is stable. Right chest wall port is again seen. There is now complete opacification of the right hemi thorax increased from the prior exam likely related to pleural effusion as well as consolidation in the right lung. The left lung remains clear. No acute bony abnormality is noted. Prior fracture of the proximal right humerus is seen. IMPRESSION: Significant opacification of the right hemi thorax increased from the previous day. This is likely related to a combination of consolidation and pleural effusion. Stable right humeral fracture Electronically Signed   By: Inez Catalina M.D.   On: 12/21/2015 10:18   Ct Chest W Contrast  Result Date: 12/21/2015 CLINICAL DATA:  History of small cell lung  carcinoma status post radiation and chemo. Presents with worsening shortness of breath EXAM: CT CHEST WITH CONTRAST TECHNIQUE: Multidetector CT imaging of the chest was performed during intravenous contrast administration. CONTRAST:  68m ISOVUE-300 IOPAMIDOL (ISOVUE-300) INJECTION 61% COMPARISON:  12/13/2015, 09/14/2015 FINDINGS: Cardiovascular: Atherosclerosis of the aorta. Aorta is non aneurysmal. There are coronary artery calcifications. There is a small small to moderate pericardial effusion, similar compared to previous exam. Central venous catheter is present on the right, the tip terminates in the distal SVC. There is severe narrowed appearance  of the superior vena cava. Mediastinum/Nodes: Again visualized are multiple enlarged supraclavicular lymph nodes. Current exam does not image as superiorly as the prior study. Index right paratracheal lymph node measures 1.7 cm compared with 1.6 cm previously. Series 2, image number 28. Index prevascular lymph node measures 2.2 cm, series 2, image number 33, compared with 2.2 cm previously. No axillary adenopathy. Trachea is midline. There is severe narrowing of the distal right mainstem bronchus. The esophagus is grossly unremarkable. Enlarged lymph node anterior to the distal esophagus measures 1.5 cm compare with 1.4 cm previously, series 2, image number 98. Lungs/Pleura: There is a small left-sided pleural effusion, this is increased. There is a moderate right-sided pleural effusion, also slightly increased. There is interval increase in diffuse consolidation throughout the right lung with minimal peripheral aerated lung noted. A large right perihilar lung mass is again visualized but difficult to separate from adjacent consolidated lung. Portions of the mass appear contiguous with ill-defined adenopathy in the pretracheal space. There is severe narrowing of the right bronchus by tumor. Upper Abdomen: Multiple calcified gallstones. Adrenal glands within normal  limits. Kidneys appear atrophic. Musculoskeletal: Bones are osteopenic. No acute osseous abnormality. Severe chronic deformity of the proximal right humerus. IMPRESSION: 1. Moderate large right-sided pleural effusion, increased compared to prior with interval progression of diffuse airspace consolidation throughout the right thorax. Previously noted large perihilar lung mass is difficult to separate from adjacent consolidated lung. The right bronchus is severely narrowed by tumor. There is narrowing of the SVC by adenopathy and mass lesion. 2. Extensive mediastinal and supraclavicular adenopathy, grossly unchanged. 3. New small left-sided pleural effusion. Stable small focus of scarring in the apical left upper lobe. 4. Small to moderate pericardial effusion 5. Gallstones Electronically Signed   By: Donavan Foil M.D.   On: 12/21/2015 23:10   Ct Chest W Contrast  Result Date: 12/13/2015 CLINICAL DATA:  Lung cancer recurrence. EXAM: CT CHEST WITH CONTRAST TECHNIQUE: Multidetector CT imaging of the chest was performed during intravenous contrast administration. CONTRAST:  51m ISOVUE-300 IOPAMIDOL (ISOVUE-300) INJECTION 61% COMPARISON:  09/14/2015 FINDINGS: Cardiovascular: The heart size is normal. The heart size is normal. There is a new small pericardial effusion. Aortic atherosclerosis noted. Calcification within the LAD coronary artery is identified. Mediastinum/Nodes: The trachea appears patent and is midline. Normal appearance of the esophagus. Progression of mediastinal adenopathy. Index right paratracheal lymph node measures 1.6 cm, image 34 of series 2. Previously 1.0 cm. Index pre-vascular lymph node measures 2.2 cm, image 43 of series 2. Previously 0.9 cm. New bilateral supraclavicular adenopathy. Index right supraclavicular node, image number 12 of series 2. Measures 1.6 cm index left supraclavicular lymph node measures 1.4 cm. Lungs/Pleura: Large central right perihilar mass is identified and is new  from previous exam compatible with local tumor recurrence. The mass measures 9 x 10.9 by 8.2 cm, image 62 of series 2. There is narrowing of the right lower lobe, right middle lobe and right upper lobe airways. There is also significant mass effect upon the branches of the right pulmonary artery. New right upper lobe pulmonary nodule measures 9 mm, image number 49 of series 4. Perifissural nodule within the right middle lobe measures 11 mm, image number 60 of series 4. New from previous exam. There is a moderate to large right pleural effusion which is new from previous exam. Upper Abdomen: Multiple small stones identified within the gallbladder. The adrenal glands appear normal. No acute findings noted within the upper abdomen. Musculoskeletal: The bones appear  diffusely osteopenic. Chronic fracture deformity involving the proximal right humerus noted. IMPRESSION: 1. Examination positive for recurrence of disease. 2. Compared with 09/14/2015 there has been marked progression of mediastinal and bilateral supraclavicular nodal metastases. 3. Large right perihilar lung mass is new from the previous exam. 4. New right pleural effusion with perifissural nodularity suspicious for pleural spread of tumor. 5. Small pericardial effusion is new from previous exam. Cannot rule out malignant pericardial effusion. 6. Gallstones Electronically Signed   By: Kerby Moors M.D.   On: 12/13/2015 16:36   US Biopsy  Addendum Date: 12/20/2015   ADDENDUM REPORT: 12/20/2015 17:19 ADDENDUM: Addendum created to address the exam title. EXAM: ULTRASOUND GUIDED SUPRACLAVICULAR NODE BIOPSY Electronically Signed   By: Corrie Mckusick D.O.   On: 12/20/2015 17:19   Result Date: 12/20/2015 INDICATION: 75 year old female with a history of small cell carcinoma previously treated. Evidence of recurrence with new supraclavicular adenopathy. EXAM: ULTRASOUND BIOPSY CORE LIVER MEDICATIONS: None. ANESTHESIA/SEDATION: Moderate (conscious) sedation  was employed during this procedure. A total of Versed 1.0 mg and Fentanyl 50 mcg was administered intravenously. Moderate Sedation Time: 10 minutes. The patient's level of consciousness and vital signs were monitored continuously by radiology nursing throughout the procedure under my direct supervision. FLUOROSCOPY TIME:  None COMPLICATIONS: None PROCEDURE: Informed written consent was obtained from the patient after a thorough discussion of the procedural risks, benefits and alternatives. All questions were addressed. Maximal Sterile Barrier Technique was utilized including caps, mask, sterile gowns, sterile gloves, sterile drape, hand hygiene and skin antiseptic. A timeout was performed prior to the initiation of the procedure. Patient positioned supine position on the ultrasound table, with ultrasound survey of the right supraclavicular region and images stored sent to PACs. The patient is then prepped and draped in the usual sterile fashion. The skin and subcutaneous tissues were generously infiltrated 1% lidocaine for local anesthesia. Using ultrasound guidance, multiple core biopsy were achieved of target lymph node. Final image was stored. Patient tolerated the procedure well and remained hemodynamically stable throughout. No complications were encountered and no significant blood loss. IMPRESSION: Status post ultrasound-guided core biopsy of right supraclavicular node. Tissue specimen sent to pathology for complete histopathologic analysis. Signed, Dulcy Fanny. Earleen Newport, DO [facility] Radiology Electronically Signed: By: Corrie Mckusick D.O. On: 12/15/2015 15:34   Dg Chest Port 1 View  Result Date: 12/20/2015 CLINICAL DATA:  Post right thoracentesis, lung cancer EXAM: PORTABLE CHEST 1 VIEW COMPARISON:  12/20/2015 FINDINGS: Cardiomediastinal silhouette is stable. Persistent consolidation in the right upper hemi thorax. There is improvement in aeration right mid  and lower lung. Residual right basilar atelectasis or infiltrate. No pneumothorax. Left lung is clear. Again noted old fracture deformity of right proximal humerus. Stable right IJ Port-A-Cath position IMPRESSION: Persistent consolidation in the right upper hemi thorax. There is improvement in aeration right mid and lower lung. Residual right basilar atelectasis or infiltrate. No pneumothorax. Left lung is clear. Electronically Signed   By: Lahoma Crocker M.D.   On: 12/20/2015 17:00   Dg Chest Port 1 View  Result Date: 12/20/2015 CLINICAL DATA:  Increased shortness of breath after recently being discharged with pneumonia. EXAM: PORTABLE CHEST 1 VIEW COMPARISON:  12/11/2015 FINDINGS: A right jugular Port-A-Cath remains in place with tip overlying the lower SVC. The right heart border is obscured. The visualized cardiac silhouette is unchanged. Aortic atherosclerosis is noted. There is new complete opacification of the right hemithorax without significant mediastinal shift. No evidence of left-sided lung  consolidation or left pleural effusion. No pneumothorax. Proximal right humerus fracture again noted. IMPRESSION: 1. New complete opacification of the right hemithorax consistent with re-accumulation of large pleural effusion. 2. Aortic atherosclerosis. Electronically Signed   By: Logan Bores M.D.   On: 12/20/2015 15:25   Dg Chest Port 1 View  Result Date: 12/11/2015 CLINICAL DATA:  75 year old female with a history of right-sided small cell carcinoma. Status post right-sided thoracentesis EXAM: PORTABLE CHEST 1 VIEW COMPARISON:  PET-CT 10/11/2015, chest x-ray 12/11/2015 1:16 a.m. FINDINGS: Cardiomediastinal silhouette unchanged. Dense opacity persists in the right hilar region. Calcifications of the aortic arch. Since the prior there is improved aeration on the right with improved visualization the right hemidiaphragm. No visualized pneumothorax. Trace thickening of the minor fissure persists. No evidence of  left-sided pleural effusion. Unchanged position of right IJ port catheter. Right humeral fracture again noted, better seen on plain film of September 2017. IMPRESSION: Status post right-sided thoracentesis, with improved aeration and no visualized pneumothorax. Hilar opacity in this patient with known small cell carcinoma, may represent a combination of treatment effect, adenopathy, residual pleural fluid, atelectasis/consolidation, or parenchymal involvement. Unchanged right IJ port catheter. Re- demonstration of known right humerus fracture. Signed, Dulcy Fanny. Earleen Newport, DO [facility] Radiology Electronically Signed   By: Corrie Mckusick D.O.   On: 12/11/2015 11:04   Dg Chest Port 1 View  Result Date: 12/11/2015 CLINICAL DATA:  Acute onset of shortness of breath. Recently diagnosed with bronchitis. Initial encounter. EXAM: PORTABLE CHEST 1 VIEW COMPARISON:  PET/CT performed 10/11/2015 FINDINGS: A moderate right-sided pleural effusion is noted, with right-sided airspace opacity. This may reflect pneumonia. Underlying recurrent malignancy is a concern. The left lung appears relatively clear.  No pneumothorax is seen. The cardiomediastinal silhouette is enlarged. Right perihilar postradiation change is again noted. A right-sided chest port is noted ending about the mid SVC. No acute osseous abnormalities are seen. IMPRESSION: Moderate right-sided pleural effusion, with right-sided airspace opacity. This may reflect pneumonia. Underlying recurrent malignancy is a concern. Diagnostic thoracentesis could be considered for further evaluation, or PET/CT could be considered 3-4 weeks after completion of treatment for pneumonia. Electronically Signed   By: Garald Balding M.D.   On: 12/11/2015 01:35   US Thoracentesis Asp Pleural Space W/img Guide  Result Date: 12/20/2015 INDICATION: Metastatic small cell lung cancer, dyspnea, recurrent right pleural effusion. Request made  for therapeutic right thoracentesis. EXAM: ULTRASOUND GUIDED THERAPEUTIC RIGHT THORACENTESIS MEDICATIONS: None. COMPLICATIONS: None immediate. PROCEDURE: An ultrasound guided thoracentesis was thoroughly discussed with the patient and questions answered. The benefits, risks, alternatives and complications were also discussed. The patient understands and wishes to proceed with the procedure. Written consent was obtained. Ultrasound was performed to localize and mark an adequate pocket of fluid in the right chest. The area was then prepped and draped in the normal sterile fashion. 1% Lidocaine was used for local anesthesia. Under ultrasound guidance a Safe-T-Centesis catheter was introduced. Thoracentesis was performed. The catheter was removed and a dressing applied. FINDINGS: A total of approximately 1.1 liters of yellow fluid was removed. IMPRESSION: Successful ultrasound guided therapeutic right thoracentesis yielding 1.1 liters of pleural fluid. Read by: Rowe Robert, PA-C Electronically Signed   By: Jerilynn Mages.  Shick M.D.   On: 12/20/2015 16:50   US Thoracentesis Asp Pleural Space W/img Guide  Result Date: 12/11/2015 INDICATION: History of treated small cell lung cancer and recent diagnosis of bronchitis. Now admitted with dyspnea and a moderate right pleural effusion. Request is  made for diagnostic and therapeutic thoracentesis. EXAM: ULTRASOUND GUIDED DIAGNOSTIC AND THERAPEUTIC THORACENTESIS MEDICATIONS: 1% lidocaine COMPLICATIONS: None immediate. PROCEDURE: An ultrasound guided thoracentesis was thoroughly discussed with the patient and questions answered. The benefits, risks, alternatives and complications were also discussed. The patient understands and wishes to proceed with the procedure. Written consent was obtained. Ultrasound was performed to localize and mark an adequate pocket of fluid in the right chest. The area was then prepped and draped in the normal sterile fashion. 1% Lidocaine was used for local  anesthesia. Under ultrasound guidance a Safe-T-Centesis catheter was introduced. Thoracentesis was performed. The catheter was removed and a dressing applied. FINDINGS: A total of approximately 1 L of clear yellow fluid was removed. Samples were sent to the laboratory as requested by the clinical team. IMPRESSION: Successful ultrasound guided right thoracentesis yielding 1 L of pleural fluid. Read by: Saverio Danker, PA-C Electronically Signed   By: Jerilynn Mages.  Shick M.D.   On: 12/11/2015 10:48    ASSESSMENT & PLAN:   75 year old female with a performance status of 2 with  #1Recurrent small cell lung cancer. Patient was previously diagnosed with limited stage small cell lung cancer and was treated with concurrent carboplatin and etoposide with radiation therapy and was able to tolerate 5 cycles of treatment with complete remission. No known brain metastases previously. Completed prophylactic cranial irradiation. PET/CT scan on 10/11/2015 with concern for new mediastinal FDG avid lymphadenopathy. MRI of the brain on 10/18/2015 was negative for metastatic disease CT scan of the chest on 12/13/2015 shows overt disease progression Biopsy of her supraclavicular lymph node confirms recurrent small cell lung cancer  Plan -patient has chosen to pursue best supportive care through hospice services. -appreciate palliative care input. -chemotherapy discontinued. -continue only comfort medications -patient expected to go down hill soon. -she is scheduled to get therapeutic thoracentesis today for dyspnea.---- if this provides good relief of dyspnea and she appears that she could tolerate a pleurex catheter placement then it could be considered. -if patient appears to be going clinically downhill rapidly and/or there is no significant benefit from therapeutic thoracentesis --would feel less strongly about placing pleurex catheter. --prayed for patient. Patient is grateful for all her cares.   I spent 20 minutes  counseling the patient face to face. The total time spent in the appointment was 25 minutes and more than 50% was on counseling and direct patient cares.    Sullivan Lone MD [place] AAHIVMS [facility] Hematology/Oncology Physician [facility]  (Office):       [phone] (Work cell):  [phone] (Fax):           [phone]

## 2015-12-22 NOTE — Procedures (Signed)
Ultrasound-guided therapeutic right thoracentesis performed yielding 1 liter of yellow  fluid. No immediate complications. Follow-up chest x-ray pending. If pleurx cath desired can pursue placement next week if sufficient pleural fluid present to allow for safe placement and pt stable enough to undergo IV conscious sedation.

## 2015-12-22 NOTE — Progress Notes (Signed)
PT Cancellation Note  Patient Details Name: Rachel Chapman MRN: [id number] DOB: December 28, 1940   Cancelled Treatment:    Reason Eval/Treat Not Completed: Medical issues which prohibited therapy (per RN, check back tomorrow.)   Claretha Cooper 12/22/2015, 8:40 AM Tresa Endo PT [phone]

## 2015-12-22 NOTE — Progress Notes (Deleted)
[facility] on call. Paged to report pt having bouts of diarrhea.

## 2015-12-22 NOTE — Progress Notes (Signed)
Date:  December 22, 2015 Chart reviewed for concurrent status and case management needs. Will continue to follow patient progress: Patient transferred to [facility] due to increased sob and wob, 02 needs increased and became hypotensive. Discharge Planning: following for needs Expected discharge date: [id number] Velva Harman, BSN, [place], [place]

## 2015-12-22 NOTE — Consult Note (Signed)
Consultation Note Date: 12/22/2015   Patient Name: Rachel Chapman  DOB: 1940/04/09  MRN: [id number]  Age / Sex: 75 y.o., female  PCP: [name], NP Referring Physician: Hosie Poisson, MD  Reason for Consultation: Disposition, Establishing goals of care and Terminal Care  HPI/Patient Profile: 75 y.o. female  with past medical history of lung ca (s/p chemo, radtx) admitted on 12/20/2015 with worsening shortness of breath. Workup reveals recurrent malignant pleural effusion refractory to thoracentesis. She was discharged from hospital on 10/30 for pleural effusion  and then readmitted on 11/8 for recurrence of pleural effusion and worsening SOB. Per Oncology patient is not candidate for further chemotherapy due to poor functional status. Palliative medicine consulted for GOC and end of life care.   Clinical Assessment and Goals of Care: Met with patient, her son, and daughter in law. All agree that their goals for patient are comfort. They understand that patient's lifetime is limited and they would like whatever interventions are necessary for comfort. They are requesting patient be discharged home with Hospice support. Patient has had recent significant declines in respiratory and functional status. Patient states her goals are to go home, be comfortable, and die peaceful natural death. She does not want to know "what's going on" or when she is going to die. Family and patient requesting pleurex catheter evaluation and placement if possible.    Primary Decision Maker NEXT OF KIN son- Daleyssa Loiselle    SUMMARY OF RECOMMENDATIONS -Discharge home with Hospice support after pleurex cath is placed -Comfort measures:  ---SOB:   -Morphine intensol (53m/0.5mL) 513mSL q1hr prn              moderate SOB and as premed for any exertional activity  -Morphine 65m13mV prn severe SOB  -Fan blowing directly in  face  -Keep room cool --Anxiety/Agitation:  -lorazepam 1mg9m or IV q 4 hours prn  -haloperidol .5mg 59m IV, SL q 4hrs prn --Pulmonary edema/Audible Terminal Secretions:  -Robinul 1 mg po or .65mg I53mr SL q 4 hrs prn --Stop IV fluids --Stop O2 via [place] --Stop all meds that do not focus on comfort --Consult to SW for residential hospice placement     Code Status/Advance Care Planning:  DNR    Symptom Management:   As above  Palliative Prophylaxis:   frequent assessment for SOB  Additional Recommendations (Limitations, Scope, Preferences):  Avoid Hospitalization, Minimize Medications, Initiate Comfort Feeding, No Artificial Feeding, No Blood Transfusions, No Chemotherapy, No Diagnostics, No Glucose Monitoring, No Hemodialysis, No IV Antibiotics, No IV Fluids, No Lab Draws, No Radiation, No Surgical Procedures and No Tracheostomy  Psycho-social/Spiritual:   Desire for further Chaplaincy support:No  Additional Recommendations: Education on Hospice  Prognosis:    < 4 weeks d/t recurrent lung cancer now with recurrent malignant pleural effusions, plan for transiition to comfort measures only  Discharge Planning: Home with Hospice  Primary Diagnoses: Present on Admission: . Recurrent right pleural effusion . Acute respiratory failure with hypoxia (HCC) .[place]-calorie malnutrition, severe (HCC) .[place]  Pleural effusion on right . Symptomatic anemia . 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 The following aspects are pertinent.  Past Medical History:  Diagnosis Date  . Anemia   . Anxiety   . Cancer ([place])   . Diabetes ([place])    Type II  . Dyslipidemia   . History of blood transfusion   . Hypertension   . Hypothyroidism   . Lung cancer ([place]) 8/216   hilar/mediastinum lung scca  . Pneumonia 2007  . Shortness of breath dyspnea    with exertion  . Smoker    1.5 packs  per day for 60 years.  Quit one month ago   Social History   Social History  . Marital status: Widowed    Spouse name: N/A  . Number of children: N/A  . Years of education: N/A   Social History Main Topics  . Smoking status: Former Smoker    Packs/day: 1.50    Years: 60.00    Quit date: 09/12/2014  . Smokeless tobacco: Never Used  . Alcohol use No  . Drug use: No  . Sexual activity: No   Other Topics Concern  . None   Social History Narrative   She has moved from [place]   She is retired from working with special education    Was married widowed twice.          Family History  Problem Relation Age of Onset  . Cancer Mother     lung non smoker  . Diabetes Mother   . Cancer Sister     colon  . Diabetes Sister   . Clotting disorder Neg Hx    Scheduled Meds: . citalopram  10 mg Oral Daily  . enoxaparin (LOVENOX) injection  40 mg Subcutaneous Q24H  . glycopyrrolate  0.1 mg Intravenous NOW  . insulin aspart  0-20 Units Subcutaneous TID WC  . insulin aspart  0-5 Units Subcutaneous QHS  . insulin aspart  3 Units Subcutaneous TID WC  . ipratropium-albuterol  3 mL Nebulization Q6H  . levothyroxine  112 mcg Oral 2 times per day  . mouth rinse  15 mL Mouth Rinse BID  . mirtazapine  30 mg Oral QHS  . morphine CONCENTRATE  5 mg Sublingual NOW  . predniSONE  40 mg Oral QAC breakfast  . sodium chloride  500 mL Intravenous Once  . sodium chloride flush  10-40 mL Intracatheter Q12H  . vitamin B-12  1,000 mcg Oral Daily   Continuous Infusions: PRN Meds:.albuterol, dextromethorphan-guaiFENesin, HYDROcodone-acetaminophen, LORazepam, morphine injection, morphine CONCENTRATE, sodium chloride flush Medications Prior to Admission:  Prior to Admission medications   Medication Sig Start Date End Date Taking? Authorizing Provider  albuterol (PROVENTIL HFA;VENTOLIN HFA) 108 (90 Base) MCG/ACT inhaler Inhale 2 puffs into the lungs every 6 (six) hours as needed for shortness of breath. 12/16/15   Yes Debbe Odea, MD  cholecalciferol (VITAMIN D) 1000 UNITS tablet Take 1 tablet (1,000 Units total) by mouth daily. 11/01/14  Yes [name], NP  citalopram (CELEXA) 10 MG tablet Take 1 tablet (10 mg total) by mouth daily. 12/15/15  Yes Debbe Odea, MD  dextromethorphan-guaiFENesin (MUCINEX DM) 30-600 MG 12hr tablet Take 1 tablet by mouth 2 (two) times daily as needed for cough (congestion). 12/15/15  Yes Debbe Odea, MD  HYDROcodone-acetaminophen (NORCO/VICODIN) 5-325 MG tablet Take 1-2 tablets by mouth every 6 (six) hours as needed for  moderate pain. 11/05/15  Yes Lajean Saver, MD  IRON PO Take 1 tablet by mouth daily.   Yes Historical Provider, MD  JANUVIA 100 MG tablet TAKE ONE TABLET BY MOUTH ONCE DAILY 05/11/15  Yes [name], NP  levothyroxine (SYNTHROID, LEVOTHROID) 112 MCG tablet TAKE ONE TABLET BY MOUTH TWICE DAILY 05/11/15  Yes [name], NP  mirtazapine (REMERON) 30 MG tablet Take 1 tablet (30 mg total) by mouth at bedtime. 12/20/15  Yes [name], NP  Multiple Vitamin (MULTIVITAMIN WITH MINERALS) TABS tablet Take 1 tablet by mouth daily.   Yes Historical Provider, MD  predniSONE (DELTASONE) 10 MG tablet Take 4 tablets (40 mg total) by mouth daily with breakfast. 40 mg the next day, 30 mg the next day, 20 mg the next day, 10 mg the last day, 5 mg the last 2 days 12/16/15  Yes Debbe Odea, MD  simvastatin (ZOCOR) 20 MG tablet Take 1 tablet (20 mg total) by mouth at bedtime. 11/01/14  Yes [name], NP  vitamin B-12 (CYANOCOBALAMIN) 1000 MCG tablet Take 1 tablet (1,000 mcg total) by mouth daily. 11/01/14  Yes [name], NP  vitamin C (ASCORBIC ACID) 500 MG tablet Take 500 mg by mouth daily.   Yes Historical Provider, MD   Allergies  Allergen Reactions  . Tradjenta [Linagliptin] Rash   Review of Systems  Constitutional: Positive for activity change, appetite change and fatigue.  HENT: Negative.   Eyes: Negative.   Respiratory: Positive for cough, shortness of breath  and wheezing.   Cardiovascular: Negative.   Gastrointestinal: Negative.   Endocrine: Negative.   Genitourinary: Negative.   Musculoskeletal: Positive for myalgias.  Skin: Negative.   Allergic/Immunologic: Negative.   Neurological: Negative.   Hematological: Negative.   Psychiatric/Behavioral: The patient is nervous/anxious.     Physical Exam  Vital Signs: BP (!) 97/47   Pulse (!) 127   Temp 98.1 F (36.7 C) (Oral)   Resp 20   Ht 5' 3"  (1.6 m)   Wt 73.4 kg (161 lb 14.4 oz)   SpO2 (!) 81%   BMI 28.68 kg/m  Pain Assessment: 0-10   Pain Score: 3    SpO2: SpO2: (!) 81 % O2 Device:SpO2: (!) 81 % O2 Flow Rate: .O2 Flow Rate (L/min): 3 L/min  IO: Intake/output summary:  Intake/Output Summary (Last 24 hours) at 12/22/15 1301 Last data filed at 12/22/15 0908  Gross per 24 hour  Intake              490 ml  Output                0 ml  Net              490 ml    LBM: Last BM Date: 12/21/15 Baseline Weight: Weight: 69.9 kg (154 lb) Most recent weight: Weight: 73.4 kg (161 lb 14.4 oz)     Palliative Assessment/Data: PPS: 30%     Thank you for this consult. Palliative medicine will continue to follow and assist as needed.   Time In: 1200 Time Out: 1315 Time Total: 75 minutes Greater than 50%  of this time was spent counseling and coordinating care related to the above assessment and plan.  Signed by: Mariana Kaufman, AGNP-C Palliative Medicine    Please contact Palliative Medicine Team phone at [phone] for questions and concerns.  For individual provider: See Shea Evans

## 2015-12-23 DIAGNOSIS — R0602 Shortness of breath: Secondary | ICD-10-CM

## 2015-12-23 MED ORDER — POLYETHYLENE GLYCOL 3350 17 G PO PACK
17.0000 g | PACK | Freq: Every day | ORAL | Status: DC
  Administered 2015-12-24 – 2015-12-26 (×3): 17 g via ORAL
  Filled 2015-12-23 (×3): qty 1

## 2015-12-23 MED ORDER — SENNOSIDES-DOCUSATE SODIUM 8.6-50 MG PO TABS
2.0000 | ORAL_TABLET | Freq: Two times a day (BID) | ORAL | Status: DC
  Administered 2015-12-24 – 2015-12-26 (×5): 2 via ORAL
  Filled 2015-12-23 (×5): qty 2

## 2015-12-23 MED ORDER — ALBUTEROL SULFATE (2.5 MG/3ML) 0.083% IN NEBU
2.5000 mg | INHALATION_SOLUTION | RESPIRATORY_TRACT | Status: DC | PRN
  Administered 2015-12-24 (×2): 2.5 mg via RESPIRATORY_TRACT
  Filled 2015-12-23 (×2): qty 3

## 2015-12-23 NOTE — Progress Notes (Signed)
O2 extension given to patient. Patient not placed on BiPAP at this time due to no indication at this time. RT will continue to monitor.

## 2015-12-23 NOTE — Progress Notes (Signed)
PT Cancellation Note  Patient Details Name: Rachel Chapman MRN: [id number] DOB: 08/04/1940   Cancelled Treatment:     PT deferred this pm on advice of RN 2* pt fatigue.  Will follow.   Kersten Salmons 12/23/2015, 4:23 PM

## 2015-12-23 NOTE — Progress Notes (Signed)
PROGRESS NOTE    Rachel Chapman  YOV:[id number] DOB: 1940/12/27 DOA: 12/20/2015 PCP: [name], NP    Brief Narrative: Rachel Chapman is a 75 y.o. female with medical history significant of SCLC s/p radiation and chemo,COPD, DM type 2,HTN, HLD, hypothyroidism, anemia, and anxiety, recently discharged from the hospital on 11/3 after being treated for right pleural effusion/ hcap, presents today for worsening sob. She had a  recent PET 10/11/15 showing new mediastinal lymphadenopathy consistent with recurrent carcinoma-. She underwent a lymph node biopsy on 11/3, shows metastatic small cell lung carcinoma. She underwent thoracentesis on 11/8 and fluid sent for analysis. Palliative care consulted and recommendations.   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 Worsening breathing on 11/9, repeat CXR, complete opacification on the right, .  Transferred the pateint to step down, ordered CT chest showed  Moderate large right-sided pleural effusion, increased compared to prior with interval progression of diffuse airspace consolidation throughout the right thorax. Previously noted large perihilar lung mass is difficult to separate from adjacent consolidated lung. The right bronchus is severely narrowed by tumor. There is narrowing of the SVC by adenopathy and mass lesion. Extensive mediastinal and supraclavicular adenopathy . Small left sided pleural effusion, and small to moderate  pericardial effusion. Discussed wth results with the patient and the family and Dr Irene Limbo her oncologist. Palliative care consulted and pt and family wanted to consider home hospice and the patient did not want any aggressive measures.  They have decided to pursue comfort measures for dignity and quality of life, but would like a pleurex catheter placement for recurrent effusions. Request IR consult to see if it can be done. Plan for pleurex catheter placement next week.      Metastatic small cell lung ca:  Further management as per Dr Irene Limbo.  Suspect she will not tolerate chemo at this point.    Severe protein calorie malnutrition.    Acute kidney injury: Unclear etiology.  dehydration ? Gentle hydration  Repeat renal parameters in am show improvement.    Hypotension, tachycardia: Tachycardia, from increased work of breathing, suspect low bp from narcotic pain  Meds.  Resolved.    Diabetes mellitus: CBG (last 3)   Recent Labs  12/21/15 2306 12/22/15 0748 12/22/15 1210  GLUCAP 203* 150* 327*    On resistant SSI we will probably d/c checking cbgs' on discharge.    DVT prophylaxis: (Lovenox/ Code Status: DNR Family Communication:  No family members at bedside.  Disposition Plan: HOME hospice evntually.    Consultants:   Oncology.   Palliative care  IR.    Procedures: thoracentesis.   Antimicrobials: none.    Subjective: Comfortable  Today.  Objective: Vitals:   12/22/15 2123 12/23/15 0033 12/23/15 0626 12/23/15 0730  BP: 104/66  128/64   Pulse: (!) 110  (!) 110   Resp: 16  16   Temp: 98.6 F (37 C)  98.5 F (36.9 C)   TempSrc: Oral  Oral  SpO2: 99% 96% 97% 92%  Weight: 79.8 kg (175 lb 14.8 oz)     Height: '5\' 3"'$  (1.6 m)       Intake/Output Summary (Last 24 hours) at 12/23/15 1406 Last data filed at 12/23/15 1200  Gross per 24 hour  Intake              350 ml  Output             1576 ml  Net            -1226 ml   Filed Weights   12/20/15  2345 12/21/15 1649 12/22/15 2123  Weight: 69.9 kg (154 lb) 73.4 kg (161 lb 14.4 oz) 79.8 kg (175 lb 14.8 oz)    Examination:  General exam: comfortable Respiratory system: bilateral rhonchi, diminished on the right.  Cardiovascular system: tachycardic. , No pedal edema. Gastrointestinal system: Abdomen is nondistended, soft and nontender. No organomegaly or masses felt. Normal bowel sounds heard. Central nervous system: Alert and oriented. No focal neurological deficits. Extremities: Symmetric 5 x 5 power. Skin: No rashes, lesions or ulcers      Data Reviewed: I have personally reviewed following labs and imaging studies  CBC:  Recent Labs Lab 12/20/15 1536 12/22/15 1153  WBC 8.7 10.6*  NEUTROABS 7.7 9.5*  HGB 10.4* 9.7*  HCT 32.5* 30.5*  MCV 101.9* 101.3*  PLT 157 086   Basic Metabolic Panel:  Recent Labs Lab 12/20/15 1536 12/21/15 1300 12/22/15 1153  NA 138 137 136  K 4.7 4.7 4.7  CL 101 100* 100*  CO2 34* 31 32  GLUCOSE 341* 475* 335*  BUN 22* 30* 24*  CREATININE 0.83 1.28* 1.09*  CALCIUM 8.5* 8.3* 8.2*   GFR: Estimated Creatinine Clearance: 43.3 mL/min (by C-G formula based on SCr of 1.09 mg/dL (H)). Liver Function Tests:  Recent Labs Lab 12/20/15 1536  AST 14*  ALT 28  ALKPHOS 63  BILITOT 0.6  PROT 6.1*  ALBUMIN 3.4*   No results for input(s): LIPASE, AMYLASE in the last 168 hours. No results for input(s): AMMONIA in the last 168 hours. Coagulation Profile: No results for input(s): INR, PROTIME in the last 168 hours. Cardiac Enzymes:  Recent Labs Lab 12/21/15 0535  CKTOTAL 24*   BNP (last 3 results) No results for input(s): PROBNP in the last 8760 hours. HbA1C: No results for input(s): HGBA1C in the last 72 hours. CBG:  Recent Labs Lab 12/21/15 1621 12/21/15 1701 12/21/15 2306 12/22/15 0748 12/22/15 1210  GLUCAP 392* 348* 203* 150* 327*   Lipid Profile: No results for input(s): CHOL, HDL, LDLCALC, TRIG, CHOLHDL, LDLDIRECT  in the last 72 hours. Thyroid Function Tests: No results for input(s): TSH, T4TOTAL, FREET4, T3FREE, THYROIDAB in the last 72 hours. Anemia Panel: No results for input(s): VITAMINB12, FOLATE, FERRITIN, TIBC, IRON, RETICCTPCT in the last 72 hours. Sepsis Labs:  Recent Labs Lab 12/20/15 1549  LATICACIDVEN 0.83    Recent Results (from the past 240 hour(s))  Body fluid culture (includes gram stain)     Status: None (Preliminary result)   Collection Time: 12/20/15  8:07 PM  Result Value Ref Range Status   Specimen Description FLUID RIGHT PLEURAL  Final   Special Requests NONE  Final   Gram Stain   Final    ABUNDANT WBC PRESENT, PREDOMINANTLY MONONUCLEAR NO ORGANISMS SEEN    Culture   Final    NO GROWTH 3 DAYS Performed at [facility]    Report Status PENDING  Incomplete  Radiology Studies: Ct Chest W Contrast  Result Date: 12/21/2015 CLINICAL DATA:  History of small cell lung carcinoma status post radiation and chemo. Presents with worsening shortness of breath EXAM: CT CHEST WITH CONTRAST TECHNIQUE: Multidetector CT imaging of the chest was performed during intravenous contrast administration. CONTRAST:  55m ISOVUE-300 IOPAMIDOL (ISOVUE-300) INJECTION 61% COMPARISON:  12/13/2015, 09/14/2015 FINDINGS: Cardiovascular: Atherosclerosis of the aorta. Aorta is non aneurysmal. There are coronary artery calcifications. There is a small small to moderate pericardial effusion, similar compared to previous exam. Central venous catheter is present on the right, the tip terminates in the distal SVC. There is severe narrowed appearance of the superior vena cava. Mediastinum/Nodes: Again visualized are multiple enlarged supraclavicular lymph nodes. Current exam does not image as superiorly as the prior study. Index right paratracheal lymph node measures 1.7 cm compared with 1.6 cm previously. Series 2, image number 28. Index prevascular lymph node measures 2.2 cm, series 2, image  number 33, compared with 2.2 cm previously. No axillary adenopathy. Trachea is midline. There is severe narrowing of the distal right mainstem bronchus. The esophagus is grossly unremarkable. Enlarged lymph node anterior to the distal esophagus measures 1.5 cm compare with 1.4 cm previously, series 2, image number 98. Lungs/Pleura: There is a small left-sided pleural effusion, this is increased. There is a moderate right-sided pleural effusion, also slightly increased. There is interval increase in diffuse consolidation throughout the right lung with minimal peripheral aerated lung noted. A large right perihilar lung mass is again visualized but difficult to separate from adjacent consolidated lung. Portions of the mass appear contiguous with ill-defined adenopathy in the pretracheal space. There is severe narrowing of the right bronchus by tumor. Upper Abdomen: Multiple calcified gallstones. Adrenal glands within normal limits. Kidneys appear atrophic. Musculoskeletal: Bones are osteopenic. No acute osseous abnormality. Severe chronic deformity of the proximal right humerus. IMPRESSION: 1. Moderate large right-sided pleural effusion, increased compared to prior with interval progression of diffuse airspace consolidation throughout the right thorax. Previously noted large perihilar lung mass is difficult to separate from adjacent consolidated lung. The right bronchus is severely narrowed by tumor. There is narrowing of the SVC by adenopathy and mass lesion. 2. Extensive mediastinal and supraclavicular adenopathy, grossly unchanged. 3. New small left-sided pleural effusion. Stable small focus of scarring in the apical left upper lobe. 4. Small to moderate pericardial effusion 5. Gallstones Electronically Signed   By: KDonavan FoilM.D.   On: 12/21/2015 23:10   Dg Chest Port 1 View  Result Date: 12/22/2015 CLINICAL DATA:  Status post thoracentesis.  Shortness of Breath EXAM: PORTABLE CHEST 1 VIEW COMPARISON:   December 21, 2015 chest radiograph and chest CT December 21, 2015 FINDINGS: Significant diminution right pleural effusion following thoracentesis. No pneumothorax. There is consolidation with sizable mass in the right perihilar/right upper lobe regions. Left lung is clear. Heart is upper normal in size with pulmonary vascularity within normal limits. There is atherosclerotic calcification in the aorta. Port-A-Cath tip is in the superior vena cava. There is a pathologic appearing fracture in the proximal right humerus. Areas of adenopathy are much better seen on CT than on current radiographic examination. IMPRESSION: No pneumothorax. Significantly less effusion on the right following thoracentesis. Sizable right perihilar region mass with right upper lobe consolidation. Left lung remains clear. Stable cardiac silhouette. Aortic atherosclerosis noted. Suspect pathologic fracture proximal right humerus, stable. Electronically Signed   By: WLowella GripIII M.D.   On: 12/22/2015 17:20   UKoreaThoracentesis Asp Pleural Space  W/img Guide  Result Date: 12/22/2015 INDICATION: Metastatic small cell lung cancer, dyspnea, recurrent right pleural effusion. Request made for therapeutic right thoracentesis. EXAM: ULTRASOUND GUIDED THERAPEUTIC RIGHT THORACENTESIS MEDICATIONS: None. COMPLICATIONS: None immediate. PROCEDURE: An ultrasound guided thoracentesis was thoroughly discussed with the patient and questions answered. The benefits, risks, alternatives and complications were also discussed. The patient understands and wishes to proceed with the procedure. Written consent was obtained. Ultrasound was performed to localize and mark an adequate pocket of fluid in the right chest. The area was then prepped and draped in the normal sterile fashion. 1% Lidocaine was used for local anesthesia. Under ultrasound guidance a Safe-T-Centesis catheter was introduced. Thoracentesis was performed. The catheter was removed and a dressing  applied. FINDINGS: A total of approximately 1 liter of yellow fluid was removed. IMPRESSION: Successful ultrasound guided therapeutic right thoracentesis yielding 1 liter of pleural fluid. Read by: Rowe Robert, PA-C Electronically Signed   By: Jerilynn Mages.  Shick M.D.   On: 12/22/2015 17:26        Scheduled Meds: . citalopram  10 mg Oral Daily  . ipratropium-albuterol  3 mL Nebulization Q6H  . levothyroxine  112 mcg Oral 2 times per day  . mouth rinse  15 mL Mouth Rinse BID  . mirtazapine  30 mg Oral QHS  . sodium chloride  500 mL Intravenous Once  . sodium chloride flush  10-40 mL Intracatheter Q12H  . sodium chloride flush  3 mL Intravenous Q12H   Continuous Infusions: . sodium chloride 10 mL/hr at 12/22/15 1300     LOS: 3 days    Time spent: 25 min    Samul Mcinroy, MD [facility] Pager [phone]  If 7PM-7AM, please contact night-coverage [email or web address] Password [email or web address] 12/23/2015, 2:06 PM

## 2015-12-23 NOTE — Progress Notes (Signed)
Pt's daughter presents with belittling staff. States that she wants her mother to have rehab every day. Demanded that her mother receive pain medicines even though pt denied pain. Daughter continued to demand Morphine and RN explained that Morphine was written for respiratory issues and that it could not be given because pt was breathing well and O2 sat 99% on 3L.Marland KitchenMarland Kitchenplus she had no c/o pain. Daughter requested stool softeners. MD notified and stool softeners ordered. Daughter wanted pt to have pajamas on. Pt told RN that she did not want her pajamas on and daughter stated "Yes she does!" RN asked daughter where the pjs were and she stated "I don't know...that's not my job." Lobbyist to have a witness go in when family is present.

## 2015-12-24 LAB — BODY FLUID CULTURE: Culture: NO GROWTH

## 2015-12-24 MED ORDER — MORPHINE SULFATE (PF) 2 MG/ML IV SOLN
2.0000 mg | INTRAVENOUS | Status: DC | PRN
  Administered 2015-12-24 – 2015-12-26 (×2): 2 mg via INTRAVENOUS
  Filled 2015-12-24 (×2): qty 1

## 2015-12-24 MED ORDER — FUROSEMIDE 10 MG/ML IJ SOLN
20.0000 mg | Freq: Once | INTRAMUSCULAR | Status: AC
  Administered 2015-12-24: 20 mg via INTRAVENOUS
  Filled 2015-12-24: qty 2

## 2015-12-24 NOTE — Progress Notes (Signed)
PROGRESS NOTE    Rachel Chapman  MVH:[id number] DOB: June 10, 1940 DOA: 12/20/2015 PCP: [name], NP    Brief Narrative: Rachel Chapman is a 75 y.o. female with medical history significant of SCLC s/p radiation and chemo,COPD, DM type 2,HTN, HLD, hypothyroidism, anemia, and anxiety, recently discharged from the hospital on 11/3 after being treated for right pleural effusion/ hcap, presents today for worsening sob. She had a  recent PET 10/11/15 showing new mediastinal lymphadenopathy consistent with recurrent carcinoma-. She underwent a lymph node biopsy on 11/3, shows metastatic small cell lung carcinoma. She underwent thoracentesis on 11/8 and fluid sent for analysis. Palliative care consulted and recommendations given.   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 Worsening breathing on 11/9, repeat CXR, complete opacification on the right, .  Transferred the pateint to step down, ordered CT chest showed  Moderate large right-sided pleural effusion, increased compared to prior with interval progression of diffuse airspace consolidation throughout the right thorax. Previously noted large perihilar lung mass is difficult to separate from adjacent consolidated lung. The right bronchus is severely narrowed by tumor. There is narrowing of the SVC by adenopathy and mass lesion. Extensive mediastinal and supraclavicular adenopathy . Small left sided pleural effusion, and small to  moderate pericardial effusion. Discussed wth results with the patient and the family and Dr Irene Limbo her oncologist. Palliative care consulted and pt and family wanted to consider home hospice and the patient did not want any aggressive measures.  They have decided to pursue comfort measures for dignity and quality of life, but would like a pleurex catheter placement for recurrent effusions. Request IR consult to see if it can be done. Plan for pleurex catheter placement next week by radiology     Metastatic small cell lung ca:  Further management as per Dr Irene Limbo.  Suspect she will not tolerate chemo at this point. Family and patient has opted for comfort care.   Severe protein calorie malnutrition. Supplementation given.    Acute kidney injury: Unclear etiology.  dehydration ? Gentle hydration  Repeat renal parameters in am show improvement. No more blood work .    Hypotension, tachycardia: Tachycardia, from increased work of breathing, suspect low bp from narcotic pain  Meds.  Resolved.    Diabetes mellitus: CBG (last 3)   Recent Labs  12/21/15 2306 12/22/15 0748 12/22/15 1210  GLUCAP 203* 150* 327*    On resistant SSI we will probably d/c checking cbgs' on discharge.   Constipation: resolved.    DVT prophylaxis: (Lovenox/ Code Status: DNR Family Communication:  No family members at bedside.  Disposition Plan: HOME hospice evntually.    Consultants:   Oncology.   Palliative care  IR.    Procedures: thoracentesis.   Antimicrobials: none.    Subjective: No new complaints.   Objective: Vitals:   12/24/15 0818 12/24/15 1004 12/24/15 1400 12/24/15 1457  BP:   126/64   Pulse:   (!) 107   Resp:  Temp:   98.4 F (36.9 C)   TempSrc:   Oral   SpO2: 97% 95% 96% 97%  Weight:      Height:        Intake/Output Summary (Last 24 hours) at 12/24/15 1743 Last data filed at 12/24/15 1400  Gross per 24 hour  Intake              120 ml  Output              2500 ml  Net            -2380 ml   Filed Weights   12/20/15 2345 12/21/15 1649 12/22/15 2123  Weight: 69.9 kg (154 lb) 73.4 kg (161 lb 14.4 oz) 79.8 kg (175 lb 14.8 oz)    Examination:  General exam: comfortable on 3 lit [place]  Respiratory system: bilateral rhonchi, diminished on the right.  Cardiovascular system: tachycardic. , No pedal edema. Gastrointestinal system: Abdomen is nondistended, soft and nontender. No organomegaly or masses felt. Normal bowel sounds heard. Central nervous system: Alert and oriented. No focal neurological deficits. Extremities: Symmetric 5 x 5 power. Skin: No rashes, lesions or ulcers      Data Reviewed: I have personally reviewed following labs and imaging studies  CBC:  Recent Labs Lab 12/20/15 1536 12/22/15 1153  WBC 8.7 10.6*  NEUTROABS 7.7 9.5*  HGB 10.4* 9.7*  HCT 32.5* 30.5*  MCV 101.9* 101.3*  PLT 157 573   Basic Metabolic Panel:  Recent Labs Lab 12/20/15 1536 12/21/15 1300 12/22/15 1153  NA 138 137 136  K 4.7 4.7 4.7  CL 101 100* 100*  CO2 34* 31 32  GLUCOSE 341* 475* 335*  BUN 22* 30* 24*  CREATININE 0.83 1.28* 1.09*  CALCIUM 8.5* 8.3* 8.2*   GFR: Estimated Creatinine Clearance: 43.3 mL/min (by C-G formula based on SCr of 1.09 mg/dL (H)). Liver Function Tests:  Recent Labs Lab 12/20/15 1536  AST 14*  ALT 28  ALKPHOS 63  BILITOT 0.6  PROT 6.1*  ALBUMIN 3.4*   No results for input(s): LIPASE, AMYLASE in the last 168 hours. No results for input(s): AMMONIA in the last 168 hours. Coagulation Profile: No results for input(s): INR, PROTIME in the last 168 hours. Cardiac Enzymes:  Recent Labs Lab 12/21/15 0535  CKTOTAL 24*   BNP (last 3 results) No results for input(s): PROBNP in the last 8760 hours. HbA1C: No results for input(s): HGBA1C in the last 72 hours. CBG:  Recent Labs Lab 12/21/15 1621 12/21/15 1701 12/21/15 2306 12/22/15 0748 12/22/15 1210  GLUCAP 392* 348* 203* 150* 327*   Lipid  Profile: No results for input(s): CHOL, HDL, LDLCALC, TRIG, CHOLHDL, LDLDIRECT in the last 72 hours. Thyroid Function Tests: No results for input(s): TSH, T4TOTAL, FREET4, T3FREE, THYROIDAB in the last 72 hours. Anemia Panel: No results for input(s): VITAMINB12, FOLATE, FERRITIN, TIBC, IRON, RETICCTPCT in the last 72 hours. Sepsis Labs:  Recent Labs Lab 12/20/15 1549  LATICACIDVEN 0.83    Recent Results (from the past 240 hour(s))  Body fluid culture (includes gram stain)     Status: None   Collection Time: 12/20/15  8:07 PM  Result Value Ref Range Status   Specimen Description FLUID RIGHT PLEURAL  Final   Special Requests NONE  Final   Gram Stain   Final    ABUNDANT WBC PRESENT, PREDOMINANTLY MONONUCLEAR NO ORGANISMS SEEN    Culture   Final    NO GROWTH 3 DAYS Performed at [facility]  [facility]    Report Status 12/24/2015 FINAL  Final         Radiology Studies: No results found.      Scheduled Meds: . citalopram  10 mg Oral Daily  . ipratropium-albuterol  3 mL Nebulization Q6H  . levothyroxine  112 mcg Oral 2 times per day  . mouth rinse  15 mL Mouth Rinse BID  . mirtazapine  30 mg Oral QHS  . polyethylene glycol  17 g Oral Daily  . senna-docusate  2 tablet Oral BID  . sodium chloride  500 mL Intravenous Once  . sodium chloride flush  10-40 mL Intracatheter Q12H  . sodium chloride flush  3 mL Intravenous Q12H   Continuous Infusions: . sodium chloride 10 mL/hr at 12/22/15 1300     LOS: 4 days    Time spent: 25 min    Louvina Cleary, MD [facility] Pager [phone]  If 7PM-7AM, please contact night-coverage [email or web address] Password [facility] 12/24/2015, 5:43 PM

## 2015-12-24 NOTE — Clinical Social Work Note (Addendum)
Chart reviewed. Currently no change in d/c plan- will return home with Hospice care services.  CSW services will  Continue to monitor for any changes in d/c plan.  Lorie Phenix. Pauline Good, [place] (weekend coverage)

## 2015-12-24 NOTE — Progress Notes (Signed)
Pt SOB, lungs sounded wet.  Pt received breathing tx, resp tech advised pt needed to some fluid removed from her lungs.  Notified provider on call, obtained order for 20 mg Lasix IV.  Will continue to montor.

## 2015-12-24 NOTE — Progress Notes (Signed)
Attempted to ambulate patient to restroom per previous request of family. Patients O2 sat dropped to 80% Quick recovery noted once patient back to bed sats in 90's with no distress noted.

## 2015-12-24 NOTE — Progress Notes (Signed)
After pt had administered 20 mg Lasix, her output increased by 500cc.

## 2015-12-24 NOTE — Progress Notes (Signed)
Pt refused to take her scheduled stool softener and Miralax because she stated that she had a large bm earlier this evening. It was confirmed with NT Annette.  Will continue to monitor.

## 2015-12-24 NOTE — Evaluation (Signed)
Physical Therapy Evaluation Patient Details Name: Rachel Chapman MRN: [id number] DOB: 31-Mar-1940 Today's Date: 12/24/2015   History of Present Illness  Pt admitted with SOB and dx with recurrent R pleural effusion and resp failure.  Pt with hx of DM. COPD, and Lung CA  Clinical Impression  Pt admitted as above and presenting with functional mobility limitations 2* generalized weakness, ambulatory balance deficits and limited endurance.  Pt plans dc home with family assist and would benefit from follow up [place].    Follow Up Recommendations Home health PT    Equipment Recommendations  None recommended by PT (Pt refusing use of RW at this time)    Recommendations for Other Services OT consult     Precautions / Restrictions Precautions Precautions: Fall Precaution Comments: Pt reports dizziness and hx of fall at home Restrictions Weight Bearing Restrictions: No      Mobility  Bed Mobility Overal bed mobility: Modified Independent Bed Mobility: Supine to Sit     Supine to sit: Modified independent (Device/Increase time)     General bed mobility comments: Increased time but manouvered to EOB unassisted  Transfers Overall transfer level: Needs assistance Equipment used: None Transfers: Sit to/from Stand Sit to Stand: Min assist         General transfer comment: cues for safe transition position with min assist to steady with initial standing  Ambulation/Gait Ambulation/Gait assistance: Min assist Ambulation Distance (Feet): 450 Feet Assistive device:  (Pt refuses to use RW but willing to balance with IV pole) Gait Pattern/deviations: Step-through pattern;Decreased step length - right;Decreased step length - left;Shuffle;Wide base of support Gait velocity: decr Gait velocity interpretation: Below normal speed for age/gender General Gait Details: Min assist for balance with pt using IV pole to assist but refusing to use RW.  3 sitting rest breaks to complete task  with SaO2 down to 83% on 3L and with HR elevated to max of 120  Stairs            Wheelchair Mobility    Modified Rankin (Stroke Patients Only)       Balance Overall balance assessment: Needs assistance Sitting-balance support: No upper extremity supported;Feet supported Sitting balance-Leahy Scale: Good     Standing balance support: No upper extremity supported Standing balance-Leahy Scale: Fair                               Pertinent Vitals/Pain Pain Assessment: 0-10 Pain Score: 4  Pain Location: lower rib cage Pain Descriptors / Indicators: Aching;Dull Pain Intervention(s): Limited activity within patient's tolerance;Monitored during session;Patient requesting pain meds-RN notified    Home Living Family/patient expects to be discharged to:: Private residence Living Arrangements: Children Available Help at Discharge: Family;Available PRN/intermittently Type of Home: House       Home Layout: One level Home Equipment: None Additional Comments: Pt resistant to use of RW    Prior Function Level of Independence: Independent               Hand Dominance   Dominant Hand: Right    Extremity/Trunk Assessment   Upper Extremity Assessment: Generalized weakness           Lower Extremity Assessment: Generalized weakness         Communication   Communication: No difficulties  Cognition Arousal/Alertness: Awake/alert Behavior During Therapy: WFL for tasks assessed/performed Overall Cognitive Status: Within Functional Limits for tasks assessed  General Comments      Exercises     Assessment/Plan    PT Assessment Patient needs continued PT services  PT Problem List Decreased strength;Decreased range of motion;Decreased activity tolerance;Decreased balance;Decreased mobility;Decreased knowledge of use of DME          PT Treatment Interventions DME instruction;Gait training;Stair training;Functional  mobility training;Therapeutic activities;Therapeutic exercise;Patient/family education    PT Goals (Current goals can be found in the Care Plan section)  Acute Rehab PT Goals Patient Stated Goal: Regain strength and become as IND as possible PT Goal Formulation: With patient Time For Goal Achievement: 01-05-16 Potential to Achieve Goals: Fair    Frequency Min 3X/week   Barriers to discharge        Co-evaluation               End of Session Equipment Utilized During Treatment: Gait belt;Oxygen Activity Tolerance: Patient tolerated treatment well;Patient limited by fatigue Patient left: in chair;with call bell/phone within reach;with family/visitor present;with chair alarm set Nurse Communication: Mobility status         Time: 7673-4193 PT Time Calculation (min) (ACUTE ONLY): 30 min   Charges:   PT Evaluation $PT Eval Low Complexity: 1 Procedure PT Treatments $Gait Training: 8-22 mins   PT G Codes:        Treavor Blomquist 12/26/2015, 3:13 PM

## 2015-12-25 MED ORDER — CEFAZOLIN SODIUM-DEXTROSE 2-4 GM/100ML-% IV SOLN
2.0000 g | INTRAVENOUS | Status: AC
  Administered 2015-12-26: 2 g via INTRAVENOUS
  Filled 2015-12-25 (×2): qty 100

## 2015-12-25 MED ORDER — CEFAZOLIN SODIUM-DEXTROSE 2-4 GM/100ML-% IV SOLN: 2.0000 g | INTRAVENOUS | Status: DC

## 2015-12-25 MED ORDER — FUROSEMIDE 20 MG PO TABS: 40.0000 mg | ORAL_TABLET | Freq: Every day | ORAL | Status: DC | PRN

## 2015-12-25 NOTE — Progress Notes (Signed)
Notified by Sunday Spillers, CMRN of family request for Hospice and [place] services at home after discharge. Chart and patient information currently under review to confirm hospice eligibility.  Spoke with pt, son Jenny Reichmann, daughter Langley Gauss and granddaughter Denton Ar, at bedside to initiate education related to hospice philosophy, services and team approach to care. Family verbalized understanding of the information provided. Per discussion plan is for discharge to home by PTAR after pleurex catheter placement anticipated for tomorrow 12/26/15.   Please send signed completed DNR form home with patient.   Patient will need prescriptions for discharge comfort medications.   DME needs discussed and family requested hospital bed with 1/2 rails, over the bed table, 3 in 1 commode, extended shower chair, wheelchair and O2 concentrator. Pt currently has concentrator and filler in the home, though filler will be picked up when the rest of the equipment is delivered. Family aware.   Equipment ordered by phone with Aaron Edelman at [facility] and he will be contacting the family to arrange delivery tomorrow morning. [facility] Academic librarian notified.   The home address has been verified and is correct in the chart; Langley Gauss (daughter) to be contacted to arrange time of delivery.   [facility] aware of the above.   Completed discharge summary will need to be faxed to [facility] at [phone] when final.   Please notify [facility] when patient is ready to leave unit at discharge-call [phone].  [facility] information and contact numbers have been given to patient, son and daughter during visit.  Above information shared with Sunday Spillers, [facility].  Please call with any questions.  Thank you, Margaretmary Eddy, [facility] Liaison [phone]

## 2015-12-25 NOTE — Progress Notes (Signed)
PT Cancellation Note  Patient Details Name: Rachel Chapman MRN: [id number] DOB: 02-Oct-1940   Cancelled Treatment:     PT tx attempted this am but deferred at request of pt 2* increased fatigue and c/o shoulder pain.  Rn aware.   Kenzo Ozment 12/25/2015, 12:11 PM

## 2015-12-25 NOTE — Progress Notes (Signed)
Spoke with patient and family at bedside. Patient and family wish to go home with hospice. Provided list for Home Hospice agencies, chose [facility]. Contacted Tracy with [facility] for referral, she will f/u with patient and family today.

## 2015-12-25 NOTE — Progress Notes (Signed)
Handout given to family on Lymphedema. Pt's Rt. Arm is edematous. Elevated with pillow

## 2015-12-25 NOTE — Progress Notes (Signed)
Patient ID: Rachel Chapman, female   DOB: [birth year], 75 y.o.   MRN: [id number]    Referring Physician(s): Hosie Poisson  Supervising Physician: Sandi Mariscal  Patient Status:  [facility] - In-pt  Chief Complaint: Recurrent right-sided pleural effusion/lung cancer  Subjective:  Ajanee Calcaterrais a 76 y.o.femalewith medical history significant of SCLC s/p radiation and chemo,COPD, DM type 2,HTN, HLD, hypothyroidism, anemia, and anxiety, who presents for right PleurX catheter placement for recurrent right pleural effusion. She has previously had three thoracenteses  since 10/30 with drainage of approximately 1 liter of fluid each time. Port-a-cath and supraclavicular lymph node biopsy have also been performed by radiology. Patient will be discharged to home with hospice with focus of comfort care. She currently denies fever, headache, abdominal pain, nausea, vomiting or abnormal bleeding. She does have occasional chest discomfort, dyspnea and cough as well as intermittent low back pain. Past Medical History:  Diagnosis Date  . Anemia   . Anxiety   . Cancer ([place])   . Diabetes ([place])    Type II  . Dyslipidemia   . History of blood transfusion   . Hypertension   . Hypothyroidism   . Lung cancer ([place]) 8/216   hilar/mediastinum lung scca  . Pneumonia 2007  . Shortness of breath dyspnea    with exertion  . Smoker    1.5 packs per day for 60 years.  Quit one month ago   Past Surgical History:  Procedure Laterality Date  . CESAREAN SECTION    . EYE SURGERY Bilateral    Cataract     Allergies: Tradjenta [linagliptin]  Medications: Prior to Admission medications   Medication Sig Start Date End Date Taking? Authorizing Provider  albuterol (PROVENTIL HFA;VENTOLIN HFA) 108 (90 Base) MCG/ACT inhaler Inhale 2 puffs into the lungs every 6 (six) hours as needed for shortness of breath. 12/16/15  Yes Debbe Odea, MD  cholecalciferol (VITAMIN D) 1000 UNITS tablet Take 1 tablet (1,000  Units total) by mouth daily. 11/01/14  Yes [name], NP  citalopram (CELEXA) 10 MG tablet Take 1 tablet (10 mg total) by mouth daily. 12/15/15  Yes Debbe Odea, MD  dextromethorphan-guaiFENesin (MUCINEX DM) 30-600 MG 12hr tablet Take 1 tablet by mouth 2 (two) times daily as needed for cough (congestion). 12/15/15  Yes Debbe Odea, MD  HYDROcodone-acetaminophen (NORCO/VICODIN) 5-325 MG tablet Take 1-2 tablets by mouth every 6 (six) hours as needed for moderate pain. 11/05/15  Yes Lajean Saver, MD  IRON PO Take 1 tablet by mouth daily.   Yes Historical Provider, MD  JANUVIA 100 MG tablet TAKE ONE TABLET BY MOUTH ONCE DAILY 05/11/15  Yes [name], NP  levothyroxine (SYNTHROID, LEVOTHROID) 112 MCG tablet TAKE ONE TABLET BY MOUTH TWICE DAILY 05/11/15  Yes [name], NP  mirtazapine (REMERON) 30 MG tablet Take 1 tablet (30 mg total) by mouth at bedtime. 12/20/15  Yes [name], NP  Multiple Vitamin (MULTIVITAMIN WITH MINERALS) TABS tablet Take 1 tablet by mouth daily.   Yes Historical Provider, MD  predniSONE (DELTASONE) 10 MG tablet Take 4 tablets (40 mg total) by mouth daily with breakfast. 40 mg the next day, 30 mg the next day, 20 mg the next day, 10 mg the last day, 5 mg the last 2 days 12/16/15  Yes Debbe Odea, MD  simvastatin (ZOCOR) 20 MG tablet Take 1 tablet (20 mg total) by mouth at bedtime. 11/01/14  Yes [name], NP  vitamin B-12 (CYANOCOBALAMIN) 1000 MCG tablet Take 1 tablet (1,000 mcg total) by  mouth daily. 11/01/14  Yes [name], NP  vitamin C (ASCORBIC ACID) 500 MG tablet Take 500 mg by mouth daily.   Yes Historical Provider, MD     Vital Signs: BP 125/74 (BP Location: Right Arm)   Pulse (!) 102   Temp 97.8 F (36.6 C) (Oral)   Resp 18   Ht '5\' 3"'$  (1.6 m)   Wt 175 lb 14.8 oz (79.8 kg)   SpO2 96%   BMI 31.16 kg/m   Physical Exam  Alert and oriented Pulm: Diminished on right side. rhonchi CV: tachy, regular rhythm Abd: soft, nontender, nondistended. Bowel  sounds normal.  Ext: No edema  Imaging: Ct Chest W Contrast  Result Date: 12/21/2015 CLINICAL DATA:  History of small cell lung carcinoma status post radiation and chemo. Presents with worsening shortness of breath EXAM: CT CHEST WITH CONTRAST TECHNIQUE: Multidetector CT imaging of the chest was performed during intravenous contrast administration. CONTRAST:  65m ISOVUE-300 IOPAMIDOL (ISOVUE-300) INJECTION 61% COMPARISON:  12/13/2015, 09/14/2015 FINDINGS: Cardiovascular: Atherosclerosis of the aorta. Aorta is non aneurysmal. There are coronary artery calcifications. There is a small small to moderate pericardial effusion, similar compared to previous exam. Central venous catheter is present on the right, the tip terminates in the distal SVC. There is severe narrowed appearance of the superior vena cava. Mediastinum/Nodes: Again visualized are multiple enlarged supraclavicular lymph nodes. Current exam does not image as superiorly as the prior study. Index right paratracheal lymph node measures 1.7 cm compared with 1.6 cm previously. Series 2, image number 28. Index prevascular lymph node measures 2.2 cm, series 2, image number 33, compared with 2.2 cm previously. No axillary adenopathy. Trachea is midline. There is severe narrowing of the distal right mainstem bronchus. The esophagus is grossly unremarkable. Enlarged lymph node anterior to the distal esophagus measures 1.5 cm compare with 1.4 cm previously, series 2, image number 98. Lungs/Pleura: There is a small left-sided pleural effusion, this is increased. There is a moderate right-sided pleural effusion, also slightly increased. There is interval increase in diffuse consolidation throughout the right lung with minimal peripheral aerated lung noted. A large right perihilar lung mass is again visualized but difficult to separate from adjacent consolidated lung. Portions of the mass appear contiguous with ill-defined adenopathy in the pretracheal space.  There is severe narrowing of the right bronchus by tumor. Upper Abdomen: Multiple calcified gallstones. Adrenal glands within normal limits. Kidneys appear atrophic. Musculoskeletal: Bones are osteopenic. No acute osseous abnormality. Severe chronic deformity of the proximal right humerus. IMPRESSION: 1. Moderate large right-sided pleural effusion, increased compared to prior with interval progression of diffuse airspace consolidation throughout the right thorax. Previously noted large perihilar lung mass is difficult to separate from adjacent consolidated lung. The right bronchus is severely narrowed by tumor. There is narrowing of the SVC by adenopathy and mass lesion. 2. Extensive mediastinal and supraclavicular adenopathy, grossly unchanged. 3. New small left-sided pleural effusion. Stable small focus of scarring in the apical left upper lobe. 4. Small to moderate pericardial effusion 5. Gallstones Electronically Signed   By: KDonavan FoilM.D.   On: 12/21/2015 23:10   Dg Chest Port 1 View  Result Date: 12/22/2015 CLINICAL DATA:  Status post thoracentesis.  Shortness of Breath EXAM: PORTABLE CHEST 1 VIEW COMPARISON:  December 21, 2015 chest radiograph and chest CT December 21, 2015 FINDINGS: Significant diminution right pleural effusion following thoracentesis. No pneumothorax. There is consolidation with sizable mass in the right perihilar/right upper lobe regions. Left lung is clear. Heart  is upper normal in size with pulmonary vascularity within normal limits. There is atherosclerotic calcification in the aorta. Port-A-Cath tip is in the superior vena cava. There is a pathologic appearing fracture in the proximal right humerus. Areas of adenopathy are much better seen on CT than on current radiographic examination. IMPRESSION: No pneumothorax. Significantly less effusion on the right following thoracentesis. Sizable right perihilar region mass with right upper lobe consolidation. Left lung remains clear.  Stable cardiac silhouette. Aortic atherosclerosis noted. Suspect pathologic fracture proximal right humerus, stable. Electronically Signed   By: Lowella Grip III M.D.   On: 12/22/2015 17:20   US Thoracentesis Asp Pleural Space W/img Guide  Result Date: 12/22/2015 INDICATION: Metastatic small cell lung cancer, dyspnea, recurrent right pleural effusion. Request made for therapeutic right thoracentesis. EXAM: ULTRASOUND GUIDED THERAPEUTIC RIGHT THORACENTESIS MEDICATIONS: None. COMPLICATIONS: None immediate. PROCEDURE: An ultrasound guided thoracentesis was thoroughly discussed with the patient and questions answered. The benefits, risks, alternatives and complications were also discussed. The patient understands and wishes to proceed with the procedure. Written consent was obtained. Ultrasound was performed to localize and mark an adequate pocket of fluid in the right chest. The area was then prepped and draped in the normal sterile fashion. 1% Lidocaine was used for local anesthesia. Under ultrasound guidance a Safe-T-Centesis catheter was introduced. Thoracentesis was performed. The catheter was removed and a dressing applied. FINDINGS: A total of approximately 1 liter of yellow fluid was removed. IMPRESSION: Successful ultrasound guided therapeutic right thoracentesis yielding 1 liter of pleural fluid. Read by: Rowe Robert, PA-C Electronically Signed   By: Jerilynn Mages.  Shick M.D.   On: 12/22/2015 17:26    Labs:  CBC:  Recent Labs  12/11/15 0415 12/16/15 0545 12/20/15 1536 12/22/15 1153  WBC 10.8* 12.0* 8.7 10.6*  HGB 9.6* 10.3* 10.4* 9.7*  HCT 29.9* 31.7* 32.5* 30.5*  PLT 154 186 157 163    COAGS:  Recent Labs  10/31/15 1405 12/11/15 0415  INR 1.05 0.97  APTT 28 25    BMP:  Recent Labs  12/16/15 0545 12/20/15 1536 12/21/15 1300 12/22/15 1153  NA 138 138 137 136  K 4.2 4.7 4.7 4.7  CL 101 101 100* 100*  CO2 30 34* 31 32  GLUCOSE 128* 341* 475* 335*  BUN 38* 22* 30* 24*    CALCIUM 8.4* 8.5* 8.3* 8.2*  CREATININE 0.97 0.83 1.28* 1.09*  GFRNONAA 55* >60 39* 48*  GFRAA >60 >60 46* 55*    LIVER FUNCTION TESTS:  Recent Labs  09/14/15 0859 10/31/15 1405 12/11/15 0137 12/20/15 1536  BILITOT 0.38 0.5 0.4 0.6  AST '24 25 17 '$ 14*  ALT 36 37 16 28  ALKPHOS 94 75 81 63  PROT 7.2 6.3* 6.4* 6.1*  ALBUMIN 3.7 3.6 3.4* 3.4*    Assessment and Plan:  Patrena Calcaterrais a 75 y.o.femalewith medical history significant of tobacco use, SCLC s/p radiation and chemo,COPD, DM type 2,HTN, HLD, hypothyroidism, anemia, and anxiety, who presents for right PleurX catheter placement for recurrent right-sided pleural effusion. She has previously had three thoracenteses  since 10/30 with removal of approximately 1L of fluid each time. Port-a-cath and supraclavicular lymph node biopsy have also been performed by radiology. Patient will be discharged to home with  hospice with focus of comfort care.  Risks and benefits of PleurX catheter placement were discussed with the patient and family, including but not limited to bleeding and infection. All of the patient's and family's questions were answered, patient and family are agreeable to  proceed with PleurX catheter placement tomorrow, 10/14.  Consent signed and in chart.  Electronically Signed: D. Rowe Robert 12/25/2015, 1:16 PM   I spent a total of 25 Minutes at the the patient's bedside AND on the patient's hospital floor or unit, greater than 50% of which was counseling/coordinating care for right  PleurX catheter placement.

## 2015-12-25 NOTE — Progress Notes (Signed)
Physical Therapy Treatment Patient Details Name: Rachel Chapman MRN: [id number] DOB: 09-21-1940 Today's Date: 12/25/2015    History of Present Illness Pt admitted with SOB and dx with recurrent R pleural effusion and resp failure.  Pt with hx of DM. COPD, and Lung CA    PT Comments    Pt motivated to mobilize this pm but with noted decreased stability with ambulation and requiring increased rest time to complete task.  Follow Up Recommendations  Home health PT     Equipment Recommendations  None recommended by PT    Recommendations for Other Services OT consult     Precautions / Restrictions Precautions Precautions: Fall Precaution Comments: Pt reports dizziness and hx of fall at home Restrictions Weight Bearing Restrictions: No    Mobility  Bed Mobility Overal bed mobility: Modified Independent Bed Mobility: Supine to Sit     Supine to sit: HOB elevated;Modified independent (Device/Increase time)     General bed mobility comments: Increased time but manouvered to EOB unassisted  Transfers Overall transfer level: Needs assistance Equipment used: Rolling walker (2 wheeled) Transfers: Sit to/from Stand Sit to Stand: Min assist         General transfer comment: cues for safe transition position with min assist to steady with initial standing  Ambulation/Gait Ambulation/Gait assistance: Min assist Ambulation Distance (Feet): 450 Feet Assistive device: Rolling walker (2 wheeled);None Gait Pattern/deviations: Step-through pattern;Decreased step length - right;Decreased step length - left;Shuffle;Trunk flexed Gait velocity: decr Gait velocity interpretation: Below normal speed for age/gender General Gait Details: Min assist for balance and RW management with cues for posture and position from RW.  Pt required 4 seated rest breaks to complete task with SaO2 as low as 85% on 3L and HR maintained in the 80s except for 2 episodes elevated to ~140 ?Reading.  RN  aware   Stairs            Wheelchair Mobility    Modified Rankin (Stroke Patients Only)       Balance Overall balance assessment: Needs assistance Sitting-balance support: No upper extremity supported Sitting balance-Leahy Scale: Good     Standing balance support: No upper extremity supported Standing balance-Leahy Scale: Fair                      Cognition Arousal/Alertness: Awake/alert Behavior During Therapy: WFL for tasks assessed/performed Overall Cognitive Status: Within Functional Limits for tasks assessed                      Exercises      General Comments        Pertinent Vitals/Pain Pain Assessment: 0-10 Pain Score: 5  Pain Location: R shoulder Pain Descriptors / Indicators: Aching;Sore Pain Intervention(s): Limited activity within patient's tolerance;Monitored during session;Premedicated before session    Home Living                      Prior Function            PT Goals (current goals can now be found in the care plan section) Acute Rehab PT Goals Patient Stated Goal: Regain strength and become as IND as possible PT Goal Formulation: With patient Time For Goal Achievement: January 11, 2016 Potential to Achieve Goals: Fair Progress towards PT goals: Progressing toward goals    Frequency    Min 3X/week      PT Plan Current plan remains appropriate    Co-evaluation  End of Session Equipment Utilized During Treatment: Gait belt;Oxygen Activity Tolerance: Patient tolerated treatment well;Patient limited by fatigue Patient left: in bed;with call bell/phone within reach;with family/visitor present     Time: 8616-8372 PT Time Calculation (min) (ACUTE ONLY): 38 min  Charges:  $Gait Training: 23-37 mins $Therapeutic Activity: 8-22 mins                    G Codes:      Yoshie Kosel 2016/01/14, 2:40 PM

## 2015-12-25 NOTE — Progress Notes (Signed)
PROGRESS NOTE    Shacoya Burkhammer  PJS:[id number] DOB: May 09, 1940 DOA: 12/20/2015 PCP: [name], NP    Brief Narrative: Kahlyn Gladwell is a 75 y.o. female with medical history significant of SCLC s/p radiation and chemo,COPD, DM type 2,HTN, HLD, hypothyroidism, anemia, and anxiety, recently discharged from the hospital on 11/3 after being treated for right pleural effusion/ hcap, presents today for worsening sob. She had a  recent PET 10/11/15 showing new mediastinal lymphadenopathy consistent with recurrent carcinoma-. She underwent a lymph node biopsy on 11/3, shows metastatic small cell lung carcinoma. She underwent thoracentesis on 11/8 and fluid sent for analysis. Palliative care consulted and recommendations given.   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 Worsening breathing on 11/9, repeat CXR, complete opacification on the right, .  Transferred the pateint to step down, ordered CT chest showed  Moderate large right-sided pleural effusion, increased compared to prior with interval progression of diffuse airspace consolidation throughout the right thorax. Previously noted large perihilar lung mass is difficult to separate from adjacent consolidated lung. The right bronchus is severely narrowed by tumor. There is narrowing of the SVC by adenopathy and mass lesion. Extensive mediastinal and supraclavicular adenopathy . Small left sided pleural effusion, and small to  moderate pericardial effusion. Discussed wth results with the patient and the family and Dr Irene Limbo her oncologist. Palliative care consulted and pt and family wanted to consider home hospice and the patient did not want any aggressive measures.  They have decided to pursue comfort measures for dignity and quality of life, but would like a pleurex catheter placement for recurrent effusions. Request IR consult to see if it can be done. Plan for pleurex catheter placement tomorrow by radiology.      Metastatic small cell lung ca:  Further management as per Dr Irene Limbo.  Suspect she will not tolerate chemo at this point. Family and patient has opted for comfort care.   Severe protein calorie malnutrition. Supplementation given.    Acute kidney injury: Unclear etiology.  dehydration ? Gentle hydration  Repeat renal parameters in am show improvement. No more blood work .    Hypotension, tachycardia: Tachycardia, from increased work of breathing, suspect low bp from narcotic pain  Meds.  Resolved.    Diabetes mellitus: CBG (last 3)  No results for input(s): GLUCAP in the last 72 hours.  D/c checking CBG'S   Constipation: resolved.    DVT prophylaxis: (Lovenox/ Code Status: DNR Family Communication:  No family members at bedside.  Disposition Plan: HOME hospice evntually.    Consultants:   Oncology.   Palliative care  IR.    Procedures: thoracentesis.   Antimicrobials: none.    Subjective: No new complaints.   Objective: Vitals:   12/25/15 0558 12/25/15 0822 12/25/15 1359 12/25/15 1500  BP: 125/74   131/68  Pulse: (!) 102   89  Resp: 18   18  Temp: 97.8 F (36.6 C)   98.4 F (36.9 C)  TempSrc: Oral   Oral  SpO2: 97% 96% 95% 98%  Weight:      Height:        Intake/Output Summary (Last 24 hours) at 12/25/15 1728 Last data filed at 12/25/15 1200  Gross per 24 hour  Intake              780 ml  Output             1700 ml  Net             -920 ml   Filed  Weights   12/20/15 2345 12/21/15 1649 12/22/15 2123  Weight: 69.9 kg (154 lb) 73.4 kg (161 lb 14.4 oz) 79.8 kg (175 lb 14.8 oz)    Examination:  General exam: comfortable on 3 lit [place]  Respiratory system: bilateral rhonchi, diminished on the right.  Cardiovascular system: tachycardic. , No pedal edema. Gastrointestinal system: Abdomen is nondistended, soft and nontender. No organomegaly or masses felt. Normal bowel sounds heard. Central nervous system: Alert and oriented. No focal neurological deficits. Extremities: Symmetric 5 x 5 power. Skin: No rashes, lesions or ulcers      Data Reviewed: I have personally reviewed following labs and imaging studies  CBC:  Recent Labs Lab 12/20/15 1536 12/22/15 1153  WBC 8.7 10.6*  NEUTROABS 7.7 9.5*  HGB 10.4* 9.7*  HCT 32.5* 30.5*  MCV 101.9* 101.3*  PLT 157 347   Basic Metabolic Panel:  Recent Labs Lab 12/20/15 1536 12/21/15 1300 12/22/15 1153  NA 138 137 136  K 4.7 4.7 4.7  CL 101 100* 100*  CO2 34* 31 32  GLUCOSE 341* 475* 335*  BUN 22* 30* 24*  CREATININE 0.83 1.28* 1.09*  CALCIUM 8.5* 8.3* 8.2*   GFR: Estimated Creatinine Clearance: 43.3 mL/min (by C-G formula based on SCr of 1.09 mg/dL (H)). Liver Function Tests:  Recent Labs Lab 12/20/15 1536  AST 14*  ALT 28  ALKPHOS 63  BILITOT 0.6  PROT 6.1*  ALBUMIN 3.4*   No results for input(s): LIPASE, AMYLASE in the last 168 hours. No results for input(s): AMMONIA in the last 168 hours. Coagulation Profile: No results for input(s): INR, PROTIME in the last 168 hours. Cardiac Enzymes:  Recent Labs Lab 12/21/15 0535  CKTOTAL 24*   BNP (last 3 results) No results for input(s): PROBNP in the last 8760 hours. HbA1C: No results for input(s): HGBA1C in the last 72 hours. CBG:  Recent Labs Lab 12/21/15 1621 12/21/15 1701 12/21/15 2306 12/22/15 0748 12/22/15 1210  GLUCAP 392* 348* 203* 150* 327*   Lipid Profile: No results for input(s): CHOL, HDL,  LDLCALC, TRIG, CHOLHDL, LDLDIRECT in the last 72 hours. Thyroid Function Tests: No results for input(s): TSH, T4TOTAL, FREET4, T3FREE, THYROIDAB in the last 72 hours. Anemia Panel: No results for input(s): VITAMINB12, FOLATE, FERRITIN, TIBC, IRON, RETICCTPCT in the last 72 hours. Sepsis Labs:  Recent Labs Lab 12/20/15 1549  LATICACIDVEN 0.83    Recent Results (from the past 240 hour(s))  Body fluid culture (includes gram stain)     Status: None   Collection Time: 12/20/15  8:07 PM  Result Value Ref Range Status   Specimen Description FLUID RIGHT PLEURAL  Final   Special Requests NONE  Final   Gram Stain   Final    ABUNDANT WBC PRESENT, PREDOMINANTLY MONONUCLEAR NO ORGANISMS SEEN    Culture   Final    NO GROWTH 3 DAYS Performed at [facility]    Report Status 12/24/2015  FINAL  Final         Radiology Studies: No results found.      Scheduled Meds: . [START ON 12/26/2015]  ceFAZolin (ANCEF) IV  2 g Intravenous to XRAY  . citalopram  10 mg Oral Daily  . ipratropium-albuterol  3 mL Nebulization Q6H  . levothyroxine  112 mcg Oral 2 times per day  . mouth rinse  15 mL Mouth Rinse BID  . mirtazapine  30 mg Oral QHS  . polyethylene glycol  17 g Oral Daily  . senna-docusate  2 tablet Oral BID  . sodium chloride  500 mL Intravenous Once  . sodium chloride flush  10-40 mL Intracatheter Q12H  . sodium chloride flush  3 mL Intravenous Q12H   Continuous Infusions: . sodium chloride 10 mL/hr at 12/22/15 1300     LOS: 5 days    Time spent: 25 min    Daisy Mcneel, MD [facility] Pager [phone]  If 7PM-7AM, please contact night-coverage [email or web address] Password [facility] 12/25/2015, 5:28 PM

## 2015-12-26 ENCOUNTER — Inpatient Hospital Stay (HOSPITAL_COMMUNITY): Payer: Medicare Other

## 2015-12-26 ENCOUNTER — Encounter (HOSPITAL_COMMUNITY): Payer: Self-pay | Admitting: Interventional Radiology

## 2015-12-26 ENCOUNTER — Telehealth: Payer: Self-pay | Admitting: Adult Health

## 2015-12-26 HISTORY — PX: IR GENERIC HISTORICAL: IMG1180011

## 2015-12-26 LAB — CBC WITH DIFFERENTIAL/PLATELET
BASOS ABS: 0 10*3/uL (ref 0.0–0.1)
BASOS PCT: 0 %
Eosinophils Absolute: 0.1 10*3/uL (ref 0.0–0.7)
Eosinophils Relative: 1 %
HEMATOCRIT: 31.6 % — AB (ref 36.0–46.0)
HEMOGLOBIN: 9.9 g/dL — AB (ref 12.0–15.0)
LYMPHS PCT: 5 %
Lymphs Abs: 0.4 10*3/uL — ABNORMAL LOW (ref 0.7–4.0)
MCH: 32.7 pg (ref 26.0–34.0)
MCHC: 31.3 g/dL (ref 30.0–36.0)
MCV: 104.3 fL — AB (ref 78.0–100.0)
Monocytes Absolute: 0.7 10*3/uL (ref 0.1–1.0)
Monocytes Relative: 7 %
NEUTROS ABS: 8.3 10*3/uL — AB (ref 1.7–7.7)
NEUTROS PCT: 87 %
Platelets: 163 10*3/uL (ref 150–400)
RBC: 3.03 MIL/uL — ABNORMAL LOW (ref 3.87–5.11)
RDW: 15.8 % — ABNORMAL HIGH (ref 11.5–15.5)
WBC: 9.6 10*3/uL (ref 4.0–10.5)

## 2015-12-26 LAB — BASIC METABOLIC PANEL
ANION GAP: 6 (ref 5–15)
BUN: 31 mg/dL — ABNORMAL HIGH (ref 6–20)
CHLORIDE: 97 mmol/L — AB (ref 101–111)
CO2: 33 mmol/L — AB (ref 22–32)
Calcium: 8.4 mg/dL — ABNORMAL LOW (ref 8.9–10.3)
Creatinine, Ser: 0.93 mg/dL (ref 0.44–1.00)
GFR calc non Af Amer: 58 mL/min — ABNORMAL LOW (ref >60)
GLUCOSE: 293 mg/dL — AB (ref 65–99)
POTASSIUM: 5.3 mmol/L — AB (ref 3.5–5.1)
Sodium: 136 mmol/L (ref 135–145)

## 2015-12-26 MED ORDER — HALOPERIDOL 0.5 MG PO TABS: 0.5000 mg | ORAL_TABLET | ORAL | 0 refills | Status: AC | PRN

## 2015-12-26 MED ORDER — IPRATROPIUM-ALBUTEROL 0.5-2.5 (3) MG/3ML IN SOLN
3.0000 mL | Freq: Three times a day (TID) | RESPIRATORY_TRACT | Status: DC
  Administered 2015-12-26 (×2): 3 mL via RESPIRATORY_TRACT
  Filled 2015-12-26 (×3): qty 3

## 2015-12-26 MED ORDER — LORAZEPAM 1 MG PO TABS: 1.0000 mg | ORAL_TABLET | ORAL | 0 refills | Status: AC | PRN

## 2015-12-26 MED ORDER — LIDOCAINE-EPINEPHRINE (PF) 2 %-1:200000 IJ SOLN
INTRAMUSCULAR | Status: AC | PRN
  Administered 2015-12-26: 10 mL

## 2015-12-26 MED ORDER — MIDAZOLAM HCL 2 MG/2ML IJ SOLN
INTRAMUSCULAR | Status: AC
  Filled 2015-12-26: qty 6

## 2015-12-26 MED ORDER — MORPHINE SULFATE (CONCENTRATE) 10 MG/0.5ML PO SOLN: 5.0000 mg | ORAL | 0 refills | Status: AC | PRN

## 2015-12-26 MED ORDER — FENTANYL CITRATE (PF) 100 MCG/2ML IJ SOLN
INTRAMUSCULAR | Status: AC
  Filled 2015-12-26: qty 4

## 2015-12-26 MED ORDER — FUROSEMIDE 40 MG PO TABS: 40.0000 mg | ORAL_TABLET | Freq: Every day | ORAL | 0 refills | Status: AC | PRN

## 2015-12-26 MED ORDER — LIDOCAINE-EPINEPHRINE (PF) 2 %-1:200000 IJ SOLN
INTRAMUSCULAR | Status: AC
  Filled 2015-12-26: qty 20

## 2015-12-26 MED ORDER — IPRATROPIUM-ALBUTEROL 0.5-2.5 (3) MG/3ML IN SOLN: 3.0000 mL | Freq: Three times a day (TID) | RESPIRATORY_TRACT | 1 refills | Status: AC

## 2015-12-26 MED ORDER — SODIUM POLYSTYRENE SULFONATE 15 GM/60ML PO SUSP
30.0000 g | Freq: Once | ORAL | Status: DC
  Filled 2015-12-26: qty 120

## 2015-12-26 MED ORDER — HEPARIN SOD (PORK) LOCK FLUSH 100 UNIT/ML IV SOLN
500.0000 [IU] | INTRAVENOUS | Status: AC | PRN
  Administered 2015-12-26: 500 [IU]

## 2015-12-26 MED ORDER — MIDAZOLAM HCL 2 MG/2ML IJ SOLN
INTRAMUSCULAR | Status: AC | PRN
  Administered 2015-12-26: 1 mg via INTRAVENOUS

## 2015-12-26 MED ORDER — POLYETHYLENE GLYCOL 3350 17 G PO PACK: 17.0000 g | PACK | Freq: Every day | ORAL | 0 refills | Status: AC

## 2015-12-26 MED ORDER — ALTEPLASE 2 MG IJ SOLR
2.0000 mg | Freq: Once | INTRAMUSCULAR | Status: AC
  Administered 2015-12-26: 2 mg
  Filled 2015-12-26: qty 2

## 2015-12-26 MED ORDER — FENTANYL CITRATE (PF) 100 MCG/2ML IJ SOLN
INTRAMUSCULAR | Status: AC | PRN
  Administered 2015-12-26: 25 ug via INTRAVENOUS

## 2015-12-26 MED ORDER — ONDANSETRON 4 MG PO TBDP: 4.0000 mg | ORAL_TABLET | Freq: Four times a day (QID) | ORAL | 0 refills | Status: AC | PRN

## 2015-12-26 NOTE — Telephone Encounter (Signed)
Please advise 

## 2015-12-26 NOTE — Telephone Encounter (Signed)
I am ok with Hospice taking over care

## 2015-12-26 NOTE — Sedation Documentation (Signed)
Pt with O2 Sats in low 80% range. Pt placed on NRB.

## 2015-12-26 NOTE — Progress Notes (Signed)
MEDICATION RELATED CONSULT NOTE   IR Procedure Consult - Anticoagulant/Antiplatelet PTA/Inpatient Med List Review by Pharmacist    Procedure: right sided pleural drainage cath placement    Completed: 12/26/15 at 0900  Post-Procedural bleeding risk per IR MD assessment:  standard  Antithrombotic medications on inpatient or PTA profile prior to procedure:   none    Plan:     - no action needed - pharmacy will sign off  Dia Sitter, PharmD, BCPS 12/26/2015 11:00 AM

## 2015-12-26 NOTE — Care Management Important Message (Signed)
Important Message  Patient Details  Name: Rachel Chapman MRN: [id number] Date of Birth: [birth year]   Medicare Important Message Given:  Yes    Camillo Flaming 12/26/2015, 10:08 AMImportant Message  Patient Details  Name: Rachel Chapman MRN: [id number] Date of Birth: 19-Aug-1940   Medicare Important Message Given:  Yes    Camillo Flaming 12/26/2015, 10:08 AM

## 2015-12-26 NOTE — Progress Notes (Signed)
Spoke with patient and family at bedside. They are requesting a nebulizer for home use. Contacted [facility], they will arrange. Plan for d/c home today via [facility]. Notified [facility] of plan.

## 2015-12-26 NOTE — Sedation Documentation (Signed)
Pt with Sats 100% on NRB.

## 2015-12-26 NOTE — Telephone Encounter (Signed)
Pt is being discharged today from the hospital. Dewaine Oats with [facility] would like to know if [name] will be the attending physician and sigh the orders/

## 2015-12-26 NOTE — Procedures (Signed)
Successful placement of a right sided pleural drainage catheter. Approximately 1.3 L of serous pleural fluid aspirated after catheter placement. EBL: None No immediate complications.  Ronny Bacon, MD Pager #: [phone]

## 2015-12-26 NOTE — Sedation Documentation (Signed)
Pt placed back on 4L [place]. Sats 100%. Will continue to wean at pt tolerates.

## 2015-12-26 NOTE — Sedation Documentation (Signed)
Pt weaned to 3L . Sats 98-99%

## 2015-12-26 NOTE — Progress Notes (Signed)
PT Cancellation Note  Patient Details Name: Lady Wisham MRN: [id number] DOB: [birth year]   Cancelled Treatment:     PT deferred this am, pt for pleural drain.  Will follow.   Neda Willenbring 12/26/2015, 11:38 AM

## 2015-12-26 NOTE — Discharge Summary (Signed)
Physician Discharge Summary  Rachel Chapman WUJ:[id number] DOB: 1940-11-03 DOA: 12/20/2015  PCP: Rachel Peng, NP  Admit date: 12/20/2015 Discharge date: 12/26/2015  Admitted From: Home  Disposition:  [facility] hospice.   Recommendations for Outpatient Follow-up:  1. Follow up with hospice MD as needed.     Discharge Condition:stable  CODE STATUS:comfort care.  Diet recommendation: comfort feeds.   Brief/Interim Summary: Rachel Chapman a 75 y.o.femalewith medical history significant of SCLC s/p radiation and chemo,COPD, DM type 2,HTN, HLD, hypothyroidism, anemia, and anxiety, recently discharged from the hospital on 11/3 after being treated for right pleural effusion/ hcap, presents today for worsening sob. She had a recent PET 10/11/15 showing new mediastinal lymphadenopathy consistent with recurrent carcinoma-. She underwent a lymph node biopsy on 11/3, shows metastatic small cell lung carcinoma. She underwent thoracentesis on 11/8 and fluid sent for analysis. Palliative care consulted and recommendations given. Family has decided for comfort approach for dignity and quality of life.   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 Worsening breathing on 11/9, repeat CXR, complete opacification on the right, .  Transferred the pateint to step down, ordered CT chest showed  Moderate large right-sided pleural effusion, increased compared to prior with interval progression of  diffuse airspace consolidation throughout the right thorax. Previously noted large perihilar lung mass is difficult to separate from adjacent consolidated lung. The right bronchus is severely narrowed by tumor. There is narrowing of the SVC by adenopathy and mass lesion. Extensive mediastinal and supraclavicular adenopathy . Small left sided pleural effusion, and small to moderate pericardial effusion. Discussed wth results with the patient and the family and Dr Irene Limbo her oncologist. Palliative care consulted and pt and family wanted to consider home hospice and the patient did not want any aggressive measures.  They have decided to pursue comfort measures for dignity and quality of life, but would like a pleurex catheter placement for recurrent effusions. Request IR consult to see if it can be done.  Pleurex catheter placed on 11/14.    Metastatic small cell lung ca:  Further management as per Dr Irene Limbo.  Suspect she will not tolerate chemo at this point. Family and patient has opted for comfort care.   Severe protein calorie malnutrition. Supplementation given.    Acute kidney injury: Unclear etiology.  dehydration ? Gentle hydration  Repeat renal parameters in am show improvement. No more blood work .    Hypotension, tachycardia: Tachycardia, from increased work of breathing, suspect low bp from narcotic pain  Meds.  Resolved.    Diabetes mellitus: No need to check cbgs on discharge. We have discontinued diabetic medications.   Constipation: resolved.     Discharge Instructions  Discharge Instructions    Discharge instructions    Complete by:  As directed    Please follow up with hospice MD as needed.  Please follow up with oncology as recommended.       Medication List    STOP taking these medications   IRON  PO   JANUVIA 100 MG tablet Generic drug:  sitaGLIPtin   predniSONE 10 MG tablet Commonly known as:  DELTASONE   simvastatin 20 MG tablet Commonly  known as:  ZOCOR   vitamin B-12 1000 MCG tablet Commonly known as:  CYANOCOBALAMIN   vitamin C 500 MG tablet Commonly known as:  ASCORBIC ACID     TAKE these medications   albuterol 108 (90 Base) MCG/ACT inhaler Commonly known as:  PROVENTIL HFA;VENTOLIN HFA Inhale 2 puffs into the lungs every 6 (six) hours as needed for shortness of breath.   cholecalciferol 1000 units tablet Commonly known as:  VITAMIN D Take 1 tablet (1,000 Units total) by mouth daily.   citalopram 10 MG tablet Commonly known as:  CELEXA Take 1 tablet (10 mg total) by mouth daily.   dextromethorphan-guaiFENesin 30-600 MG 12hr tablet Commonly known as:  MUCINEX DM Take 1 tablet by mouth 2 (two) times daily as needed for cough (congestion).   furosemide 40 MG tablet Commonly known as:  LASIX Take 1 tablet (40 mg total) by mouth daily as needed for fluid or edema.   haloperidol 0.5 MG tablet Commonly known as:  HALDOL Take 1 tablet (0.5 mg total) by mouth every 4 (four) hours as needed for agitation (or delirium).   HYDROcodone-acetaminophen 5-325 MG tablet Commonly known as:  NORCO/VICODIN Take 1-2 tablets by mouth every 6 (six) hours as needed for moderate pain.   ipratropium-albuterol 0.5-2.5 (3) MG/3ML Soln Commonly known as:  DUONEB Take 3 mLs by nebulization 3 (three) times daily.   levothyroxine 112 MCG tablet Commonly known as:  SYNTHROID, LEVOTHROID TAKE ONE TABLET BY MOUTH TWICE DAILY   LORazepam 1 MG tablet Commonly known as:  ATIVAN Take 1 tablet (1 mg total) by mouth every 4 (four) hours as needed for anxiety.   mirtazapine 30 MG tablet Commonly known as:  REMERON Take 1 tablet (30 mg total) by mouth at bedtime.   morphine CONCENTRATE 10 MG/0.5ML Soln concentrated solution Place 0.25 mLs (5 mg total) under the tongue every hour as needed for shortness of breath.   multivitamin with minerals Tabs tablet Take 1 tablet by mouth daily.   ondansetron 4 MG disintegrating  tablet Commonly known as:  ZOFRAN-ODT Take 1 tablet (4 mg total) by mouth every 6 (six) hours as needed for nausea.   polyethylene glycol packet Commonly known as:  MIRALAX / GLYCOLAX Take 17 g by mouth daily. Start taking on:  12/27/2015      Follow-up Information    [facility] Follow up.   Specialty:  Hospice and Palliative Medicine Contact information: [place] [address] [phone]        Rachel Peng, NP. Schedule an appointment as soon as possible for a visit in 1 week(s).   Specialty:  Family Medicine Contact information: [place] [address] [phone]          Allergies  Allergen Reactions  . Tradjenta [Linagliptin] Rash    Consultations:  IR  Palliative care   Procedures/Studies: Dg Chest 2 View  Result Date: 12/21/2015 CLINICAL DATA:  Chest pain and shortness of Breath, history of lung carcinoma EXAM: CHEST  2 VIEW COMPARISON:  12/20/2015 FINDINGS: Cardiac shadow is stable. Right chest wall port is again seen. There is now complete opacification of the right hemi thorax increased from the prior exam likely related to pleural effusion as well as consolidation in the right lung. The left lung remains clear. No  acute bony abnormality is noted. Prior fracture of the proximal right humerus is seen. IMPRESSION: Significant opacification of the right hemi thorax increased from the previous day. This is likely related to a combination of consolidation and pleural effusion. Stable right humeral fracture Electronically Signed   By: Inez Catalina M.D.   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 Presents with worsening shortness of breath EXAM: CT CHEST WITH CONTRAST TECHNIQUE: Multidetector CT imaging of the chest was performed during intravenous contrast administration. CONTRAST:  60m  ISOVUE-300 IOPAMIDOL (ISOVUE-300) INJECTION 61% COMPARISON:  12/13/2015, 09/14/2015 FINDINGS: Cardiovascular: Atherosclerosis of the aorta. Aorta is non aneurysmal. There are coronary artery calcifications. There is a small small to moderate pericardial effusion, similar compared to previous exam. Central venous catheter is present on the right, the tip terminates in the distal SVC. There is severe narrowed appearance of the superior vena cava. Mediastinum/Nodes: Again visualized are multiple enlarged supraclavicular lymph nodes. Current exam does not image as superiorly as the prior study. Index right paratracheal lymph node measures 1.7 cm compared with 1.6 cm previously. Series 2, image number 28. Index prevascular lymph node measures 2.2 cm, series 2, image number 33, compared with 2.2 cm previously. No axillary adenopathy. Trachea is midline. There is severe narrowing of the distal right mainstem bronchus. The esophagus is grossly unremarkable. Enlarged lymph node anterior to the distal esophagus measures 1.5 cm compare with 1.4 cm previously, series 2, image number 98. Lungs/Pleura: There is a small left-sided pleural effusion, this is increased. There is a moderate right-sided pleural effusion, also slightly increased. There is interval increase in diffuse consolidation throughout the right lung with minimal peripheral aerated lung noted. A large right perihilar lung mass is again visualized but difficult to separate from adjacent consolidated lung. Portions of the mass appear contiguous with ill-defined adenopathy in the pretracheal space. There is severe narrowing of the right bronchus by tumor. Upper Abdomen: Multiple calcified gallstones. Adrenal glands within normal limits. Kidneys appear atrophic. Musculoskeletal: Bones are osteopenic. No acute osseous abnormality. Severe chronic deformity of the proximal right humerus. IMPRESSION: 1. Moderate large right-sided pleural effusion, increased compared to  prior with interval progression of diffuse airspace consolidation throughout the right thorax. Previously noted large perihilar lung mass is difficult to separate from adjacent consolidated lung. The right bronchus is severely narrowed by tumor. There is narrowing of the SVC by adenopathy and mass lesion. 2. Extensive mediastinal and supraclavicular adenopathy, grossly unchanged. 3. New small left-sided pleural effusion. Stable small focus of scarring in the apical left upper lobe. 4. Small to moderate pericardial effusion 5. Gallstones Electronically Signed   By: KDonavan FoilM.D.   On: 12/21/2015 23:10   Ct Chest W Contrast  Result Date: 12/13/2015 CLINICAL DATA:  Lung cancer recurrence. EXAM: CT CHEST WITH CONTRAST TECHNIQUE: Multidetector CT imaging of the chest was performed during intravenous contrast administration. CONTRAST:  764mISOVUE-300 IOPAMIDOL (ISOVUE-300) INJECTION 61% COMPARISON:  09/14/2015 FINDINGS: Cardiovascular: The heart size is normal. The heart size is normal. There is a new small pericardial effusion. Aortic atherosclerosis noted. Calcification within the LAD coronary artery is identified. Mediastinum/Nodes: The trachea appears patent and is midline. Normal appearance of the esophagus. Progression of mediastinal adenopathy. Index right paratracheal lymph node measures 1.6 cm, image 34 of series 2. Previously 1.0 cm. Index pre-vascular lymph node measures 2.2 cm, image 43 of series 2. Previously  0.9 cm. New bilateral supraclavicular adenopathy. Index right supraclavicular node, image number 12 of series 2. Measures 1.6 cm index left supraclavicular lymph node measures 1.4 cm. Lungs/Pleura: Large central right perihilar mass is identified and is new from previous exam compatible with local tumor recurrence. The mass measures 9 x 10.9 by 8.2 cm, image 62 of series 2. There is narrowing of the right lower lobe, right middle lobe and right upper lobe airways. There is also significant mass  effect upon the branches of the right pulmonary artery. New right upper lobe pulmonary nodule measures 9 mm, image number 49 of series 4. Perifissural nodule within the right middle lobe measures 11 mm, image number 60 of series 4. New from previous exam. There is a moderate to large right pleural effusion which is new from previous exam. Upper Abdomen: Multiple small stones identified within the gallbladder. The adrenal glands appear normal. No acute findings noted within the upper abdomen. Musculoskeletal: The bones appear diffusely osteopenic. Chronic fracture deformity involving the proximal right humerus noted. IMPRESSION: 1. Examination positive for recurrence of disease. 2. Compared with 09/14/2015 there has been marked progression of mediastinal and bilateral supraclavicular nodal metastases. 3. Large right perihilar lung mass is new from the previous exam. 4. New right pleural effusion with perifissural nodularity suspicious for pleural spread of tumor. 5. Small pericardial effusion is new from previous exam. Cannot rule out malignant pericardial effusion. 6. Gallstones Electronically Signed   By: Kerby Moors M.D.   On: 12/13/2015 16:36   Ir Guided Niel Hummer W Catheter Placement  Result Date: 12/26/2015 CLINICAL DATA:  History of lung cancer, now with recurrent symptomatic right-sided pleural effusion. Patient has undergone multiple (at least 3) large volume right-sided thoracentesis. The patient is now being transferred for hospice management and request has been made for ultrasound and fluoroscopic guided placement of a right-sided PleurX drainage catheter. EXAM: INSERTION OF TUNNELED RIGHT SIDED PLEURAL DRAINAGE CATHETER COMPARISON:  Ultrasound-guided thoracentesis - 12/22/2015; 12/20/2015; 12/11/2015; PET-CT - 10/11/2015; chest CT - 12/21/2015 MEDICATIONS: Ancef 2 gm IV; Antibiotic was administered in an appropriate time interval for the procedure. ANESTHESIA/SEDATION: Moderate (conscious) sedation  was employed during this procedure. A total of Versed 1 mg and Fentanyl 25 mcg was administered intravenously. Moderate Sedation Time: 15 minutes. The patient's level of consciousness and vital signs were monitored continuously by radiology nursing throughout the procedure under my direct supervision. FLUOROSCOPY TIME:  FLUOROSCOPY TIME 18 seconds (5.7 mGy) COMPLICATIONS: None immediate. PROCEDURE: The procedure, risks, benefits, and alternatives were explained to the patient, who wish to proceed with the placement of this permanent pleural catheter as the patient is seeking palliative care. The patient understand and consent to the procedure. The right lateral chest and upper abdomen were prepped with Chlorhexidine in a sterile fashion, and a sterile drape was applied covering the operative field. A sterile gown and sterile gloves were used for the procedure. Initial ultrasound scanning and fluoroscopic imaging demonstrates a recurrent moderate to large pleural effusion. Under direct ultrasound guidance, the right inferior lateral pleural space was accessed with a Yueh sheath needle after the overlying soft tissues were anesthetized with 1% lidocaine with epinephrine. An Amplatz super stiff wire was then advanced under fluoroscopy into the pleural space. A 15.5 French tunneled Pleur-X catheter was tunneled from an incision within the right upper abdominal quadrant to the access site. The pleural access site was serially dilated under fluoroscopy, ultimately allowing placement of a peel-away sheath. The catheter was advanced through the peel-away sheath.  The sheath was then removed. Final catheter positioning was confirmed with a fluoroscopic radiographic image. The access incision was closed with subcutaneous subcuticular 4-0 Vicryl, Dermabond and Steri-Strips. A Prolene retention suture was applied at the catheter exit site. Large volume thoracentesis was performed through the new catheter utilizing provided bulb  vacuum assisted drainage bag. The patient tolerated the above procedure well without immediate postprocedural complication. FINDINGS: Preprocedural ultrasound scanning demonstrates a recurrent moderate sized right sided pleural effusion. After ultrasound and fluoroscopic guided placement, the catheter is directed towards the right lung apex. Following catheter placement, approximately 1.3 L of serous pleural fluid was removed. IMPRESSION: Successful placement of permanent, tunneled right pleural drainage catheter via lateral approach. Approximately 1.3 L of serous pleural fluid was removed after catheter placement. Electronically Signed   By: Sandi Mariscal M.D.   On: 12/26/2015 10:06   US Biopsy  Addendum Date: 12/20/2015   ADDENDUM REPORT: 12/20/2015 17:19 ADDENDUM: Addendum created to address the exam title. EXAM: ULTRASOUND GUIDED SUPRACLAVICULAR NODE BIOPSY Electronically Signed   By: Corrie Mckusick D.O.   On: 12/20/2015 17:19   Result Date: 12/20/2015 INDICATION: 75 year old female with a history of small cell carcinoma previously treated. Evidence of recurrence with new supraclavicular adenopathy. EXAM: ULTRASOUND BIOPSY CORE LIVER MEDICATIONS: None. ANESTHESIA/SEDATION: Moderate (conscious) sedation was employed during this procedure. A total of Versed 1.0 mg and Fentanyl 50 mcg was administered intravenously. Moderate Sedation Time: 10 minutes. The patient's level of consciousness and vital signs were monitored continuously by radiology nursing throughout the procedure under my direct supervision. FLUOROSCOPY TIME:  None COMPLICATIONS: None PROCEDURE: Informed written consent was obtained from the patient after a thorough discussion of the procedural risks, benefits and alternatives. All questions were addressed. Maximal Sterile Barrier Technique was utilized including caps, mask, sterile gowns, sterile gloves, sterile drape, hand hygiene and skin antiseptic. A timeout was performed prior to the  initiation of the procedure. Patient positioned supine position on the ultrasound table, with ultrasound survey of the right supraclavicular region and images stored sent to PACs. The patient is then prepped and draped in the usual sterile fashion. The skin and subcutaneous tissues were generously infiltrated 1% lidocaine for local anesthesia. Using ultrasound guidance, multiple core biopsy were achieved of target lymph node. Final image was stored. Patient tolerated the procedure well and remained hemodynamically stable throughout. No complications were encountered and no significant blood loss. IMPRESSION: Status post ultrasound-guided core biopsy of right supraclavicular node. Tissue specimen sent to pathology for complete histopathologic analysis. Signed, Dulcy Fanny. Earleen Newport, DO [facility] Radiology Electronically Signed: By: Corrie Mckusick D.O. On: 12/15/2015 15:34   Dg Chest Port 1 View  Result Date: 12/26/2015 CLINICAL DATA:  75 year old female with lung cancer and pleural effusion status post ultrasound-guided right side thoracentesis on 12/22/2015. Initial encounter. EXAM: PORTABLE CHEST 1 VIEW COMPARISON:  Post thoracentesis chest radiograph 12/22/2015, chest CT 12/21/2015, and earlier. FINDINGS: Portable AP semi upright view at 0438 hours. Re-accumulation of the right pleural effusion now with recurrent complete opacification of the right hemi thorax. Left lung ventilation appears stable. Visible mediastinal contours are stable. Stable right chest porta cath, currently accessed. Stable visualized osseous structures. Non healed proximal right humerus fracture. IMPRESSION: Recurrent complete opacification of the right hemi-thorax, likely due to re-accumulation of right pleural effusion superimposed on the widespread right lung consolidation seen by CT on 12/21/2015. Electronically Signed   By: Genevie Ann M.D.   On: 12/26/2015 07:10   Dg Chest [facility]  1  View  Result Date: 12/22/2015 CLINICAL DATA:  Status post thoracentesis.  Shortness of Breath EXAM: PORTABLE CHEST 1 VIEW COMPARISON:  December 21, 2015 chest radiograph and chest CT December 21, 2015 FINDINGS: Significant diminution right pleural effusion following thoracentesis. No pneumothorax. There is consolidation with sizable mass in the right perihilar/right upper lobe regions. Left lung is clear. Heart is upper normal in size with pulmonary vascularity within normal limits. There is atherosclerotic calcification in the aorta. Port-A-Cath tip is in the superior vena cava. There is a pathologic appearing fracture in the proximal right humerus. Areas of adenopathy are much better seen on CT than on current radiographic examination. IMPRESSION: No pneumothorax. Significantly less effusion on the right following thoracentesis. Sizable right perihilar region mass with right upper lobe consolidation. Left lung remains clear. Stable cardiac silhouette. Aortic atherosclerosis noted. Suspect pathologic fracture proximal right humerus, stable. Electronically Signed   By: Lowella Grip III M.D.   On: 12/22/2015 17:20   Dg Chest Port 1 View  Result Date: 12/20/2015 CLINICAL DATA:  Post right thoracentesis, lung cancer EXAM: PORTABLE CHEST 1 VIEW COMPARISON:  12/20/2015 FINDINGS: Cardiomediastinal silhouette is stable. Persistent consolidation in the right upper hemi thorax. There is improvement in aeration right mid and lower lung. Residual right basilar atelectasis or infiltrate. No pneumothorax. Left lung is clear. Again noted old fracture deformity of right proximal humerus. Stable right IJ Port-A-Cath position IMPRESSION: Persistent consolidation in the right upper hemi thorax. There is improvement in aeration right mid and lower lung. Residual right basilar atelectasis or infiltrate. No pneumothorax. Left lung is clear. Electronically Signed   By: Lahoma Crocker M.D.   On: 12/20/2015 17:00   Dg Chest Port 1  View  Result Date: 12/20/2015 CLINICAL DATA:  Increased shortness of breath after recently being discharged with pneumonia. EXAM: PORTABLE CHEST 1 VIEW COMPARISON:  12/11/2015 FINDINGS: A right jugular Port-A-Cath remains in place with tip overlying the lower SVC. The right heart border is obscured. The visualized cardiac silhouette is unchanged. Aortic atherosclerosis is noted. There is new complete opacification of the right hemithorax without significant mediastinal shift. No evidence of left-sided lung consolidation or left pleural effusion. No pneumothorax. Proximal right humerus fracture again noted. IMPRESSION: 1. New complete opacification of the right hemithorax consistent with re-accumulation of large pleural effusion. 2. Aortic atherosclerosis. Electronically Signed   By: Logan Bores M.D.   On: 12/20/2015 15:25   Dg Chest Port 1 View  Result Date: 12/11/2015 CLINICAL DATA:  75 year old female with a history of right-sided small cell carcinoma. Status post right-sided thoracentesis EXAM: PORTABLE CHEST 1 VIEW COMPARISON:  PET-CT 10/11/2015, chest x-ray 12/11/2015 1:16 a.m. FINDINGS: Cardiomediastinal silhouette unchanged. Dense opacity persists in the right hilar region. Calcifications of the aortic arch. Since the prior there is improved aeration on the right with improved visualization the right hemidiaphragm. No visualized pneumothorax. Trace thickening of the minor fissure persists. No evidence of left-sided pleural effusion. Unchanged position of right IJ port catheter. Right humeral fracture again noted, better seen on plain film of September 2017. IMPRESSION: Status post right-sided thoracentesis, with improved aeration and no visualized pneumothorax. Hilar opacity in this patient with known small cell carcinoma, may represent a combination of treatment effect, adenopathy, residual pleural fluid, atelectasis/consolidation, or parenchymal involvement. Unchanged right IJ port catheter. Re-  demonstration of known right humerus fracture. Signed, Dulcy Fanny. Earleen Newport, DO [facility] Radiology Electronically Signed   By: Corrie Mckusick D.O.   On:  12/11/2015 11:04   Dg Chest Port 1 View  Result Date: 12/11/2015 CLINICAL DATA:  Acute onset of shortness of breath. Recently diagnosed with bronchitis. Initial encounter. EXAM: PORTABLE CHEST 1 VIEW COMPARISON:  PET/CT performed 10/11/2015 FINDINGS: A moderate right-sided pleural effusion is noted, with right-sided airspace opacity. This may reflect pneumonia. Underlying recurrent malignancy is a concern. The left lung appears relatively clear.  No pneumothorax is seen. The cardiomediastinal silhouette is enlarged. Right perihilar postradiation change is again noted. A right-sided chest port is noted ending about the mid SVC. No acute osseous abnormalities are seen. IMPRESSION: Moderate right-sided pleural effusion, with right-sided airspace opacity. This may reflect pneumonia. Underlying recurrent malignancy is a concern. Diagnostic thoracentesis could be considered for further evaluation, or PET/CT could be considered 3-4 weeks after completion of treatment for pneumonia. Electronically Signed   By: Garald Balding M.D.   On: 12/11/2015 01:35   US Thoracentesis Asp Pleural Space W/img Guide  Result Date: 12/22/2015 INDICATION: Metastatic small cell lung cancer, dyspnea, recurrent right pleural effusion. Request made for therapeutic right thoracentesis. EXAM: ULTRASOUND GUIDED THERAPEUTIC RIGHT THORACENTESIS MEDICATIONS: None. COMPLICATIONS: None immediate. PROCEDURE: An ultrasound guided thoracentesis was thoroughly discussed with the patient and questions answered. The benefits, risks, alternatives and complications were also discussed. The patient understands and wishes to proceed with the procedure. Written consent was obtained. Ultrasound was performed to localize and mark an adequate pocket of fluid in  the right chest. The area was then prepped and draped in the normal sterile fashion. 1% Lidocaine was used for local anesthesia. Under ultrasound guidance a Safe-T-Centesis catheter was introduced. Thoracentesis was performed. The catheter was removed and a dressing applied. FINDINGS: A total of approximately 1 liter of yellow fluid was removed. IMPRESSION: Successful ultrasound guided therapeutic right thoracentesis yielding 1 liter of pleural fluid. Read by: Rowe Robert, PA-C Electronically Signed   By: Jerilynn Mages.  Shick M.D.   On: 12/22/2015 17:26   US Thoracentesis Asp Pleural Space W/img Guide  Result Date: 12/20/2015 INDICATION: Metastatic small cell lung cancer, dyspnea, recurrent right pleural effusion. Request made for therapeutic right thoracentesis. EXAM: ULTRASOUND GUIDED THERAPEUTIC RIGHT THORACENTESIS MEDICATIONS: None. COMPLICATIONS: None immediate. PROCEDURE: An ultrasound guided thoracentesis was thoroughly discussed with the patient and questions answered. The benefits, risks, alternatives and complications were also discussed. The patient understands and wishes to proceed with the procedure. Written consent was obtained. Ultrasound was performed to localize and mark an adequate pocket of fluid in the right chest. The area was then prepped and draped in the normal sterile fashion. 1% Lidocaine was used for local anesthesia. Under ultrasound guidance a Safe-T-Centesis catheter was introduced. Thoracentesis was performed. The catheter was removed and a dressing applied. FINDINGS: A total of approximately 1.1 liters of yellow fluid was removed. IMPRESSION: Successful ultrasound guided therapeutic right thoracentesis yielding 1.1 liters of pleural fluid. Read by: Rowe Robert, PA-C Electronically Signed   By: Jerilynn Mages.  Shick M.D.   On: 12/20/2015 16:50   US Thoracentesis Asp Pleural Space W/img Guide  Result Date: 12/11/2015 INDICATION: History of treated small cell lung cancer and recent diagnosis of  bronchitis. Now admitted with dyspnea and a moderate right pleural effusion. Request is made for diagnostic and therapeutic thoracentesis. EXAM: ULTRASOUND GUIDED DIAGNOSTIC AND THERAPEUTIC THORACENTESIS MEDICATIONS: 1% lidocaine COMPLICATIONS: None immediate. PROCEDURE: An ultrasound guided thoracentesis was thoroughly discussed with the patient and questions answered. The benefits, risks, alternatives and complications were also discussed. The patient understands and wishes to proceed with the procedure. Written consent  was obtained. Ultrasound was performed to localize and mark an adequate pocket of fluid in the right chest. The area was then prepped and draped in the normal sterile fashion. 1% Lidocaine was used for local anesthesia. Under ultrasound guidance a Safe-T-Centesis catheter was introduced. Thoracentesis was performed. The catheter was removed and a dressing applied. FINDINGS: A total of approximately 1 L of clear yellow fluid was removed. Samples were sent to the laboratory as requested by the clinical team. IMPRESSION: Successful ultrasound guided right thoracentesis yielding 1 L of pleural fluid. Read by: Saverio Danker, PA-C Electronically Signed   By: Jerilynn Mages.  Shick M.D.   On: 12/11/2015 10:48       Subjective: No new complaints.   Discharge Exam: Vitals:   12/26/15 0855 12/26/15 0901  BP: 124/78 99/77  Pulse: (!) 102 (!) 106  Resp: 16 17  Temp:     Vitals:   12/26/15 0850 12/26/15 0855 12/26/15 0901 12/26/15 1235  BP: 106/74 124/78 99/77   Pulse: (!) 101 (!) 102 (!) 106   Resp: '12 16 17   '$ Temp:      TempSrc:      SpO2: (!) 81% 98% 100% 95%  Weight:      Height:        General: Pt is alert, awake, not in acute distress, on 2 to 3 liters of [place] oxygen.  Cardiovascular: RRR, S1/S2 +, no rubs, no gallops Respiratory: bilateral rhonchi, scattered wheezing, diminished at bases.  Abdominal: Soft, NT, ND, bowel sounds + Extremities: no edema, no cyanosis    The results of  significant diagnostics from this hospitalization (including imaging, microbiology, ancillary and laboratory) are listed below for reference.     Microbiology: Recent Results (from the past 240 hour(s))  Body fluid culture (includes gram stain)     Status: None   Collection Time: 12/20/15  8:07 PM  Result Value Ref Range Status   Specimen Description FLUID RIGHT PLEURAL  Final   Special Requests NONE  Final   Gram Stain   Final    ABUNDANT WBC PRESENT, PREDOMINANTLY MONONUCLEAR NO ORGANISMS SEEN    Culture   Final    NO GROWTH 3 DAYS Performed at [facility]    Report Status 12/24/2015 FINAL  Final     Labs: BNP (last 3 results)  Recent Labs  12/11/15 0137 12/20/15 1536  BNP 135.3* 962.8*   Basic Metabolic Panel:  Recent Labs Lab 12/20/15 1536 12/21/15 1300 12/22/15 1153 12/26/15 0620  NA 138 137 136 136  K 4.7 4.7 4.7 5.3*  CL 101 100* 100* 97*  CO2 34* 31 32 33*  GLUCOSE 341* 475* 335* 293*  BUN 22* 30* 24* 31*  CREATININE 0.83 1.28* 1.09* 0.93  CALCIUM 8.5* 8.3* 8.2* 8.4*   Liver Function Tests:  Recent Labs Lab 12/20/15 1536  AST 14*  ALT 28  ALKPHOS 63  BILITOT 0.6  PROT 6.1*  ALBUMIN 3.4*   No results for input(s): LIPASE, AMYLASE in the last 168 hours. No results for input(s): AMMONIA in the last 168 hours. CBC:  Recent Labs Lab 12/20/15 1536 12/22/15 1153 12/26/15 0620  WBC 8.7 10.6* 9.6  NEUTROABS 7.7 9.5* 8.3*  HGB 10.4* 9.7* 9.9*  HCT 32.5* 30.5* 31.6*  MCV 101.9* 101.3* 104.3*  PLT 157 163 163   Cardiac Enzymes:  Recent Labs Lab 12/21/15 0535  CKTOTAL 24*   BNP: Invalid input(s): POCBNP CBG:  Recent Labs Lab 12/21/15 1621 12/21/15 1701 12/21/15 2306 12/22/15  0748 12/22/15 1210  GLUCAP 392* 348* 203* 150* 327*   D-Dimer No results for input(s): DDIMER in the last 72 hours. Hgb A1c No results for input(s): HGBA1C in the last 72 hours. Lipid Profile No results for input(s): CHOL, HDL, LDLCALC, TRIG,  CHOLHDL, LDLDIRECT in the last 72 hours. Thyroid function studies No results for input(s): TSH, T4TOTAL, T3FREE, THYROIDAB in the last 72 hours.  Invalid input(s): FREET3 Anemia work up No results for input(s): VITAMINB12, FOLATE, FERRITIN, TIBC, IRON, RETICCTPCT in the last 72 hours. Urinalysis    Component Value Date/Time   COLORURINE YELLOW 11/25/2014 [place] 11/25/2014 1526   LABSPEC 1.006 11/25/2014 1526   PHURINE 6.5 11/25/2014 1526   GLUCOSEU NEGATIVE 11/25/2014 1526   HGBUR NEGATIVE 11/25/2014 [place] 11/25/2014 [place] 11/25/2014 1526   PROTEINUR NEGATIVE 11/25/2014 1526   UROBILINOGEN 0.2 11/25/2014 1526   NITRITE NEGATIVE 11/25/2014 1526   LEUKOCYTESUR TRACE (A) 11/25/2014 1526   Sepsis Labs Invalid input(s): PROCALCITONIN,  WBC,  LACTICIDVEN Microbiology Recent Results (from the past 240 hour(s))  Body fluid culture (includes gram stain)     Status: None   Collection Time: 12/20/15  8:07 PM  Result Value Ref Range Status   Specimen Description FLUID RIGHT PLEURAL  Final   Special Requests NONE  Final   Gram Stain   Final    ABUNDANT WBC PRESENT, PREDOMINANTLY MONONUCLEAR NO ORGANISMS SEEN    Culture   Final    NO GROWTH 3 DAYS Performed at [facility]    Report Status 12/24/2015 FINAL  Final     Time coordinating discharge: Over 30 minutes  SIGNED:   Hosie Poisson, MD  [facility] 12/26/2015, 1:38 PM Pager   If 7PM-7AM, please contact night-coverage [email or web address] Password [email or web address]

## 2015-12-26 NOTE — Telephone Encounter (Signed)
° °  Spoke with Clancy said yes he will be the attending.

## 2015-12-28 ENCOUNTER — Telehealth: Payer: Self-pay | Admitting: Adult Health

## 2015-12-28 NOTE — Telephone Encounter (Signed)
Please advise what meds you would like her to stay on, and I will call Hospice and notify them.

## 2015-12-28 NOTE — Telephone Encounter (Signed)
Nunzio Cory, nurse with [facility] [place], would like a call back to discuss pt's med list and the one's [name] would like her to stay on.

## 2015-12-29 ENCOUNTER — Telehealth: Payer: Self-pay | Admitting: Adult Health

## 2015-12-29 NOTE — Telephone Encounter (Signed)
RN need orders for pt of how much and how often to drain the pluer ex drain.  And also have question on pts medication.  Would like to have a call back.

## 2015-12-29 NOTE — Telephone Encounter (Signed)
Please see message. °

## 2016-01-12 DEATH — deceased

## 2016-03-04 ENCOUNTER — Encounter: Payer: Self-pay | Admitting: Radiation Therapy

## 2016-03-04 NOTE — Progress Notes (Signed)
   Clip from [employer]

## 2016-05-23 ENCOUNTER — Other Ambulatory Visit: Payer: Self-pay | Admitting: Nurse Practitioner

## 2016-11-04 IMAGING — MR MR HEAD WO/W CM
10 of 13 series · 33 of 48 positions shown · IV contrast (Yes)
Comparison: MRI brain 10/19/2014.

CLINICAL DATA: Small cell lung cancer diagnosed is 5 months ago.
Restaging. Half dose contrast was administered due to renal
insufficiency with a GFR of 44.

EXAM:
MRI HEAD WITHOUT AND WITH CONTRAST
TECHNIQUE: Multiplanar, multiecho pulse sequences of the brain and surrounding
structures were obtained without and with intravenous contrast.
CONTRAST:  7mL MULTIHANCE GADOBENATE DIMEGLUMINE 529 MG/ML IV SOLN

[Series 3: T1 · sagittal · 5.0mm · 0.47mm/px · 2 of 24 slices shown]
[im 1/24]
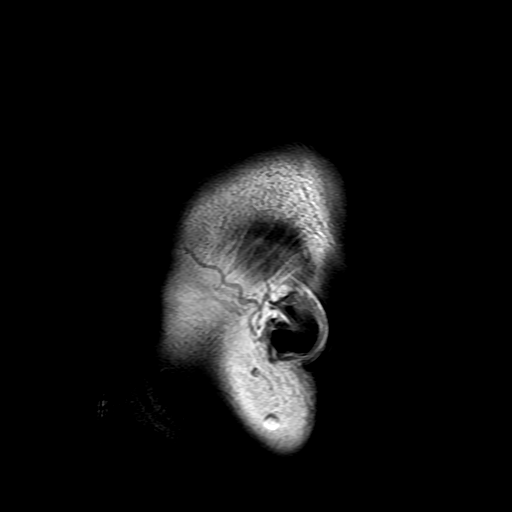
[im 12/24]
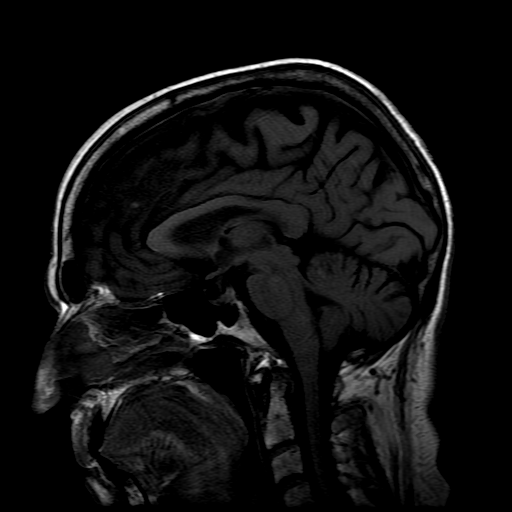

[Series 4: DWI · axial · 3.0mm · 1.09mm/px · z∈[-36,+108]mm · 8 of 98 slices shown (1 of 4)]
[im 1/98]
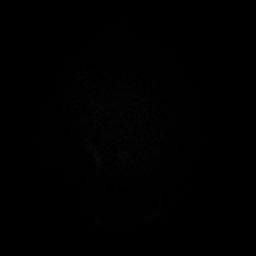
[im 11/98]
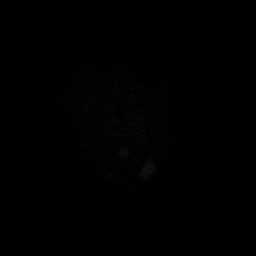
[im 33/98]
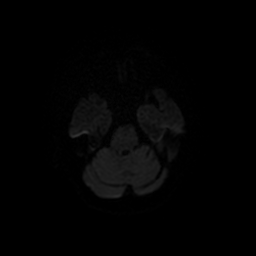
[im 44/98]
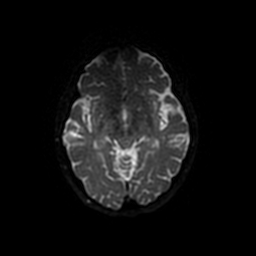
[im 54/98]
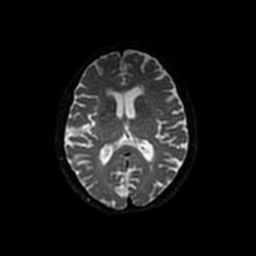
[im 65/98]
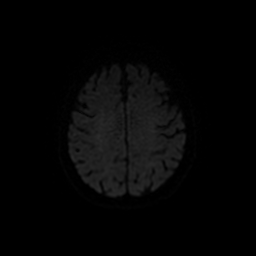
[im 87/98]
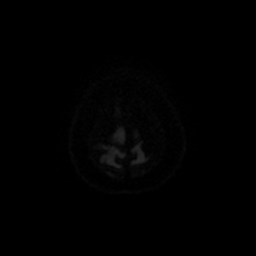
[im 98/98]
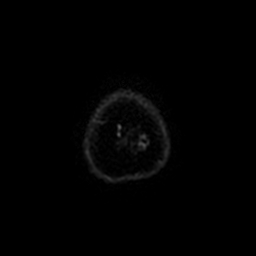

[Series 5: DWI · coronal · 5.0mm · 1.09mm/px · 6 of 68 slices shown (2 of 4)]
[im 1/68]
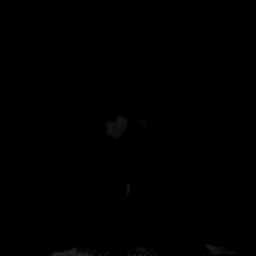
[im 14/68]
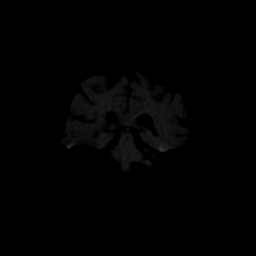
[im 27/68]
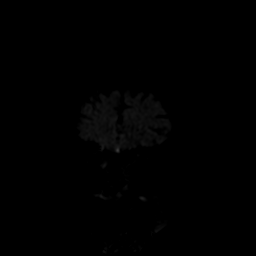
[im 41/68]
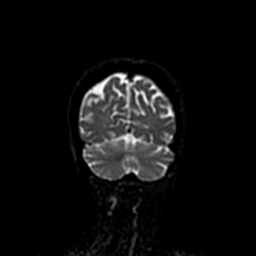
[im 54/68]
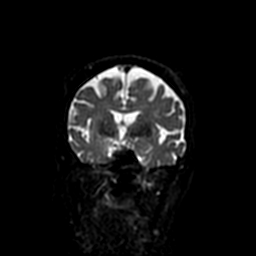
[im 68/68]
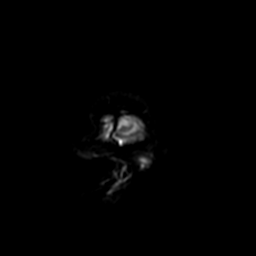

[Series 6: T2 · axial · 5.0mm · 0.43mm/px · z∈[-60,+87]mm · 2 of 24 slices shown]
[im 1/24]
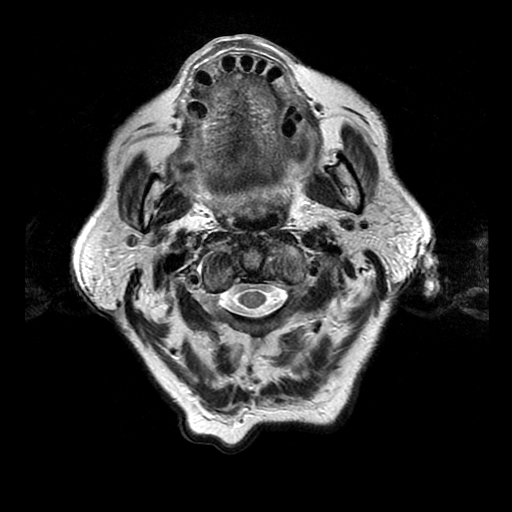
[im 24/24]
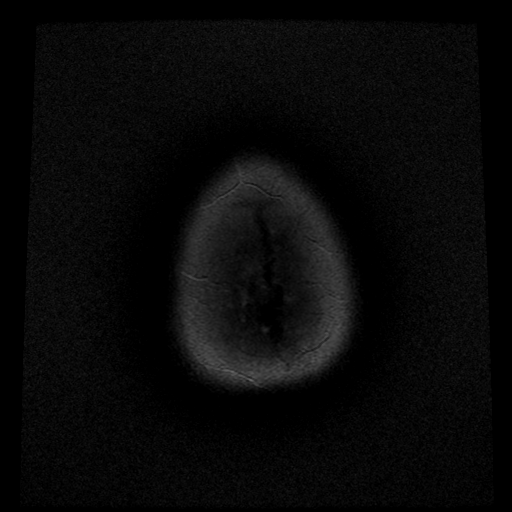

[Series 7: FLAIR · axial · 5.0mm · 0.43mm/px · z∈[-66,+93]mm · 2 of 24 slices shown]
[im 1/24]
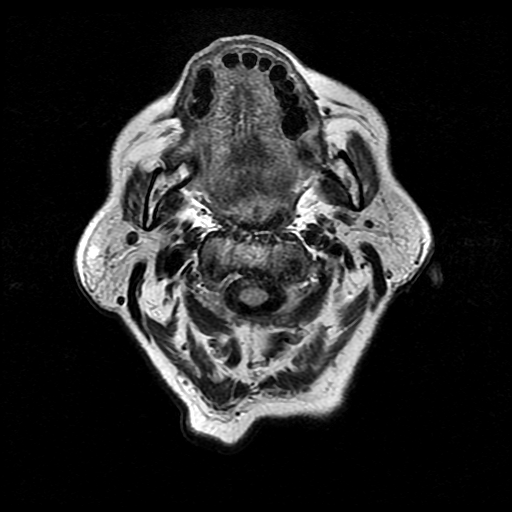
[im 24/24]
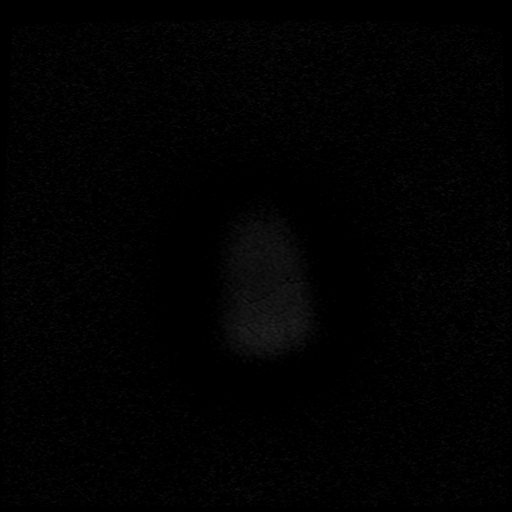

[Series 10: T2 post-contrast · coronal · 5.0mm · 0.45mm/px · 2 of 24 slices shown]
[im 1/24]
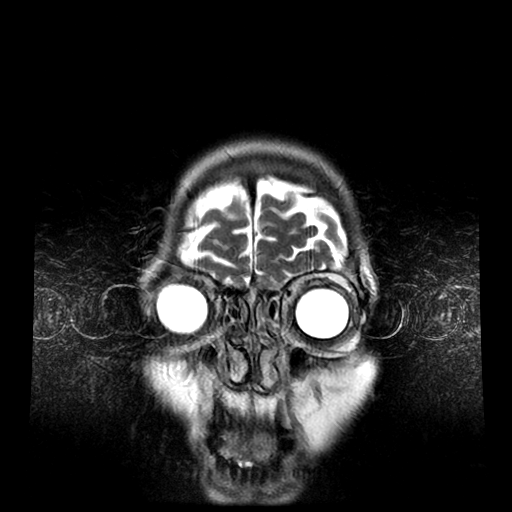
[im 24/24]
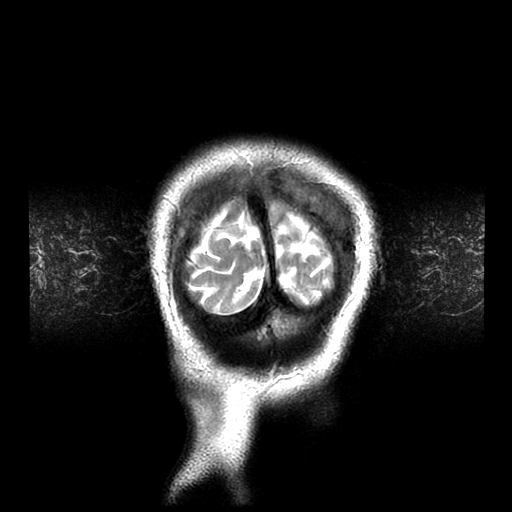

[Series 12: T1 post-contrast · coronal · 5.0mm · 0.45mm/px · 2 of 24 slices shown (1 of 2)]
[im 1/24]
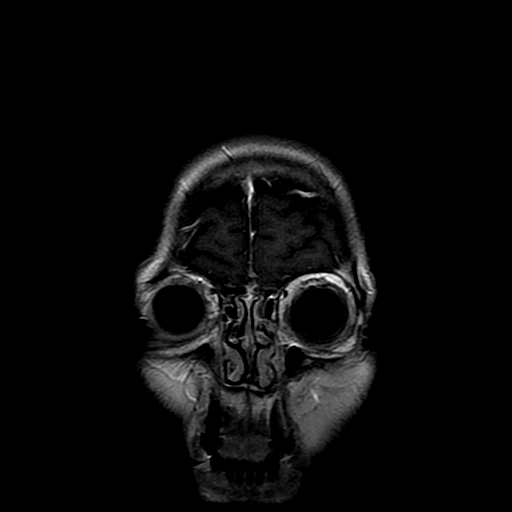
[im 24/24]
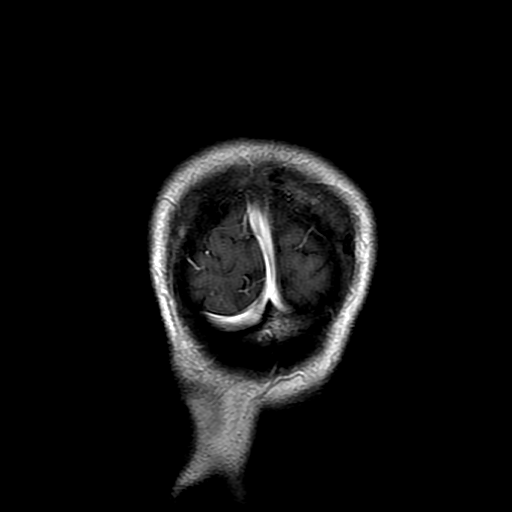

[Series 13: T1 post-contrast · sagittal · 5.0mm · 0.47mm/px · 2 of 24 slices shown (2 of 2)]
[im 1/24]
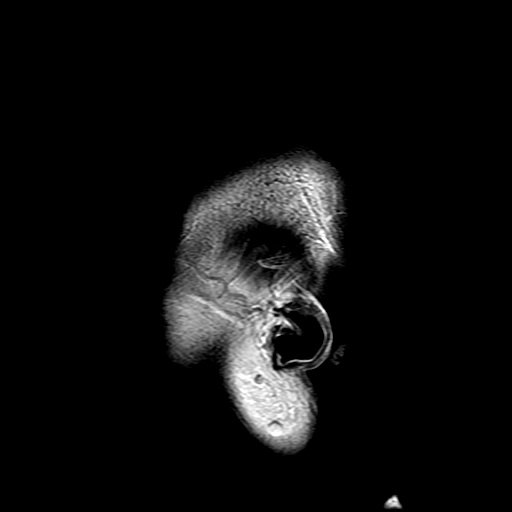
[im 24/24]
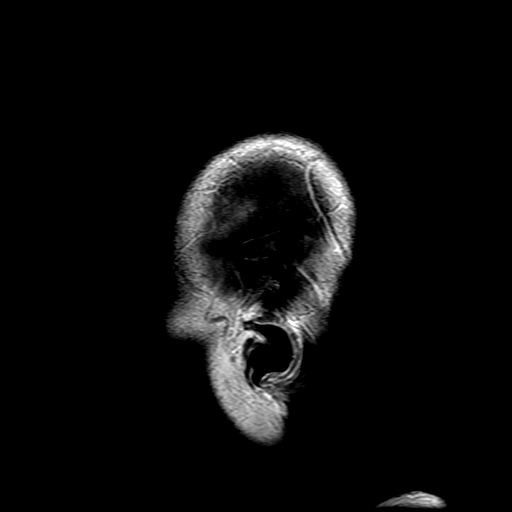

[Series 400: DWI · axial · 3.0mm · 1.09mm/px · z∈[-36,+108]mm · 4 of 49 slices shown (3 of 4)]
[im 1/49]
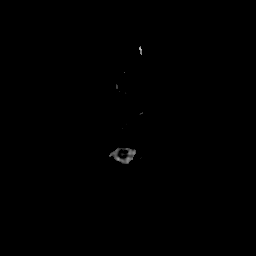
[im 17/49]
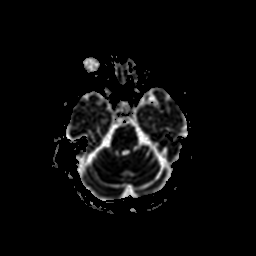
[im 33/49]
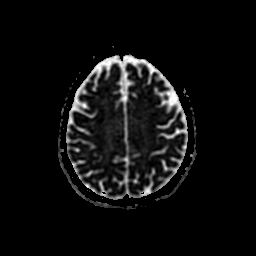
[im 49/49]
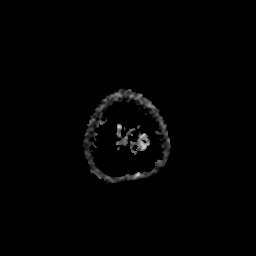

[Series 500: DWI · coronal · 5.0mm · 1.09mm/px · 3 of 34 slices shown (4 of 4)]
[im 1/34]
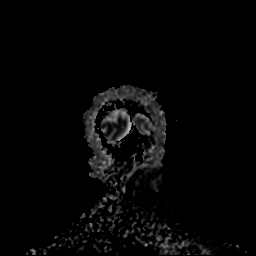
[im 17/34]
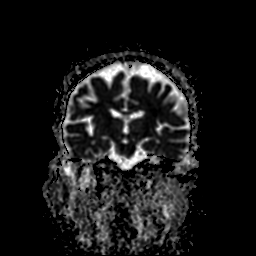
[im 34/34]
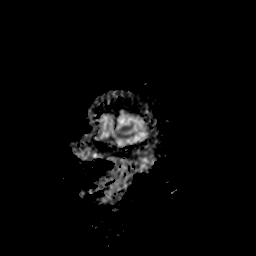

[33 of 48 positions shown; findings below may reference images not displayed]

FINDINGS: The previously noted area of enhancement along the left superior
frontal gyrus is not evident on today's study. No focal enhancement
is present to suggest metastatic disease to the brain or meninges.

Mild atrophy and white matter disease is stable. A focal
nonenhancing extra-axial lesion adjacent to the left parietal lobe
on image 11 of series 6 is stable measuring 5 mm. This likely
represents a small meningioma or osteoma.

Flow is present in the major intracranial arteries. The bilateral
lens replacements are present. The paranasal sinuses and mastoid air
cells are clear.
IMPRESSION: 1. No evidence for metastatic disease to the brain or meninges.
2. Stable 5 mm extra-axial lesion adjacent to the left parietal
lobe, likely calcified. This likely represents a small meningioma or
osteoma of the inner table. There is no significant associated
enhancement.
3. Mild atrophy and white matter disease.

## 2017-08-26 IMAGING — US IR ABSCESS DRAINAGE
1 series · 2 of 2 positions shown · non-contrast
Comparison: Ultrasound-guided thoracentesis - 12/22/2015;

CLINICAL DATA: History of lung cancer, now with recurrent
symptomatic right-sided pleural effusion. Patient has undergone
multiple (at least 3) large volume right-sided thoracentesis. The
patient is now being transferred for hospice management and request
has been made for ultrasound and fluoroscopic guided placement of a
right-sided PleurX drainage catheter.

EXAM:
INSERTION OF TUNNELED RIGHT SIDED PLEURAL DRAINAGE CATHETER

[Series 1: ir (id) (id)/(id)/(id) ir · 2 of 2 slices shown]
[im 1/2]
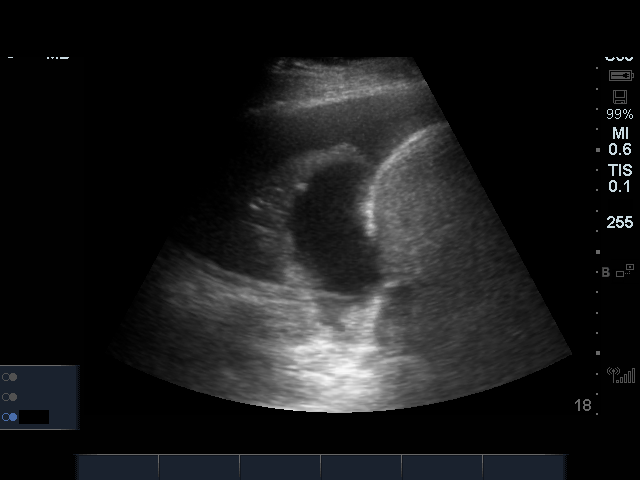
[im 2/2]
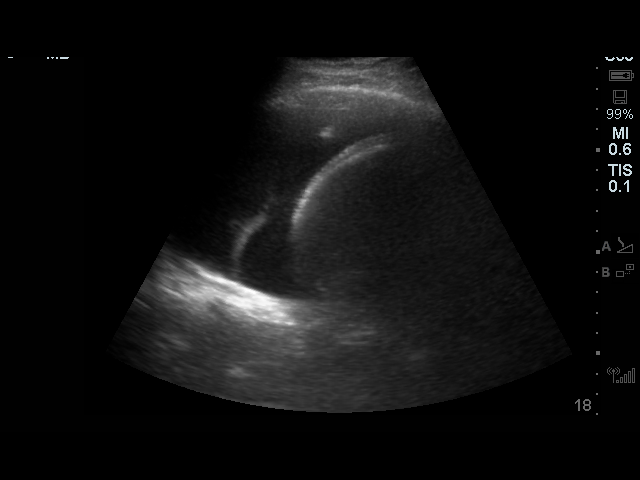

[2 of 2 positions shown; findings below may reference images not displayed]

12/20/2015; 12/11/2015; PET-CT - 10/11/2015; chest CT - 12/21/2015

MEDICATIONS:
Ancef 2 gm IV; Antibiotic was administered in an appropriate time
interval for the procedure.

ANESTHESIA/SEDATION:
Moderate (conscious) sedation was employed during this procedure. A
total of Versed 1 mg and Fentanyl 25 mcg was administered
intravenously.

Moderate Sedation Time: 15 minutes. The patient's level of
consciousness and vital signs were monitored continuously by
radiology nursing throughout the procedure under my direct
supervision.

FLUOROSCOPY TIME:  FLUOROSCOPY TIME
18 seconds (5.7 mGy)

COMPLICATIONS:
None immediate.

PROCEDURE:
The procedure, risks, benefits, and alternatives were explained to
the patient, who wish to proceed with the placement of this
permanent pleural catheter as the patient is seeking palliative
care. The patient understand and consent to the procedure.

The right lateral chest and upper abdomen were prepped with
Chlorhexidine in a sterile fashion, and a sterile drape was applied
covering the operative field. A sterile gown and sterile gloves were
used for the procedure. Initial ultrasound scanning and fluoroscopic
imaging demonstrates a recurrent moderate to large pleural effusion.

Under direct ultrasound guidance, the right inferior lateral pleural
space was accessed with Giorgi Jumper needle after the overlying
soft tissues were anesthetized with 1% lidocaine with epinephrine.
An Amplatz super stiff wire was then advanced under fluoroscopy into
the pleural space.

A 15.5 French tunneled Pleur-X catheter was tunneled from an
incision within the right upper abdominal quadrant to the access
site. The pleural access site was serially dilated under
fluoroscopy, ultimately allowing placement of a peel-away sheath.
The catheter was advanced through the peel-away sheath. The sheath
was then removed. Final catheter positioning was confirmed with a
fluoroscopic radiographic image.

The access incision was closed with subcutaneous subcuticular 4-0
Vicryl, Dermabond and Ologo. A Prolene retention suture was
applied at the catheter exit site. Large volume thoracentesis was
performed through the new catheter utilizing provided bulb vacuum
assisted drainage bag. The patient tolerated the above procedure
well without immediate postprocedural complication.
FINDINGS: Preprocedural ultrasound scanning demonstrates a recurrent moderate
sized right sided pleural effusion.

After ultrasound and fluoroscopic guided placement, the catheter is
directed towards the right lung apex.

Following catheter placement, approximately 1.3 L of serous pleural
fluid was removed.
IMPRESSION: Successful placement of permanent, tunneled right pleural drainage
catheter via lateral approach. Approximately 1.3 L of serous pleural
fluid was removed after catheter placement.

## 2017-08-26 IMAGING — DX DG CHEST 1V PORT
1 series · 1 of 1 positions shown · non-contrast
Comparison: Post thoracentesis chest radiograph 12/22/2015, chest
CT 12/21/2015, and earlier.

CLINICAL DATA: 75-year-old female with lung cancer and pleural
effusion status post ultrasound-guided right side thoracentesis on
12/22/2015. Initial encounter.

EXAM:
PORTABLE CHEST 1 VIEW

[chest ap]
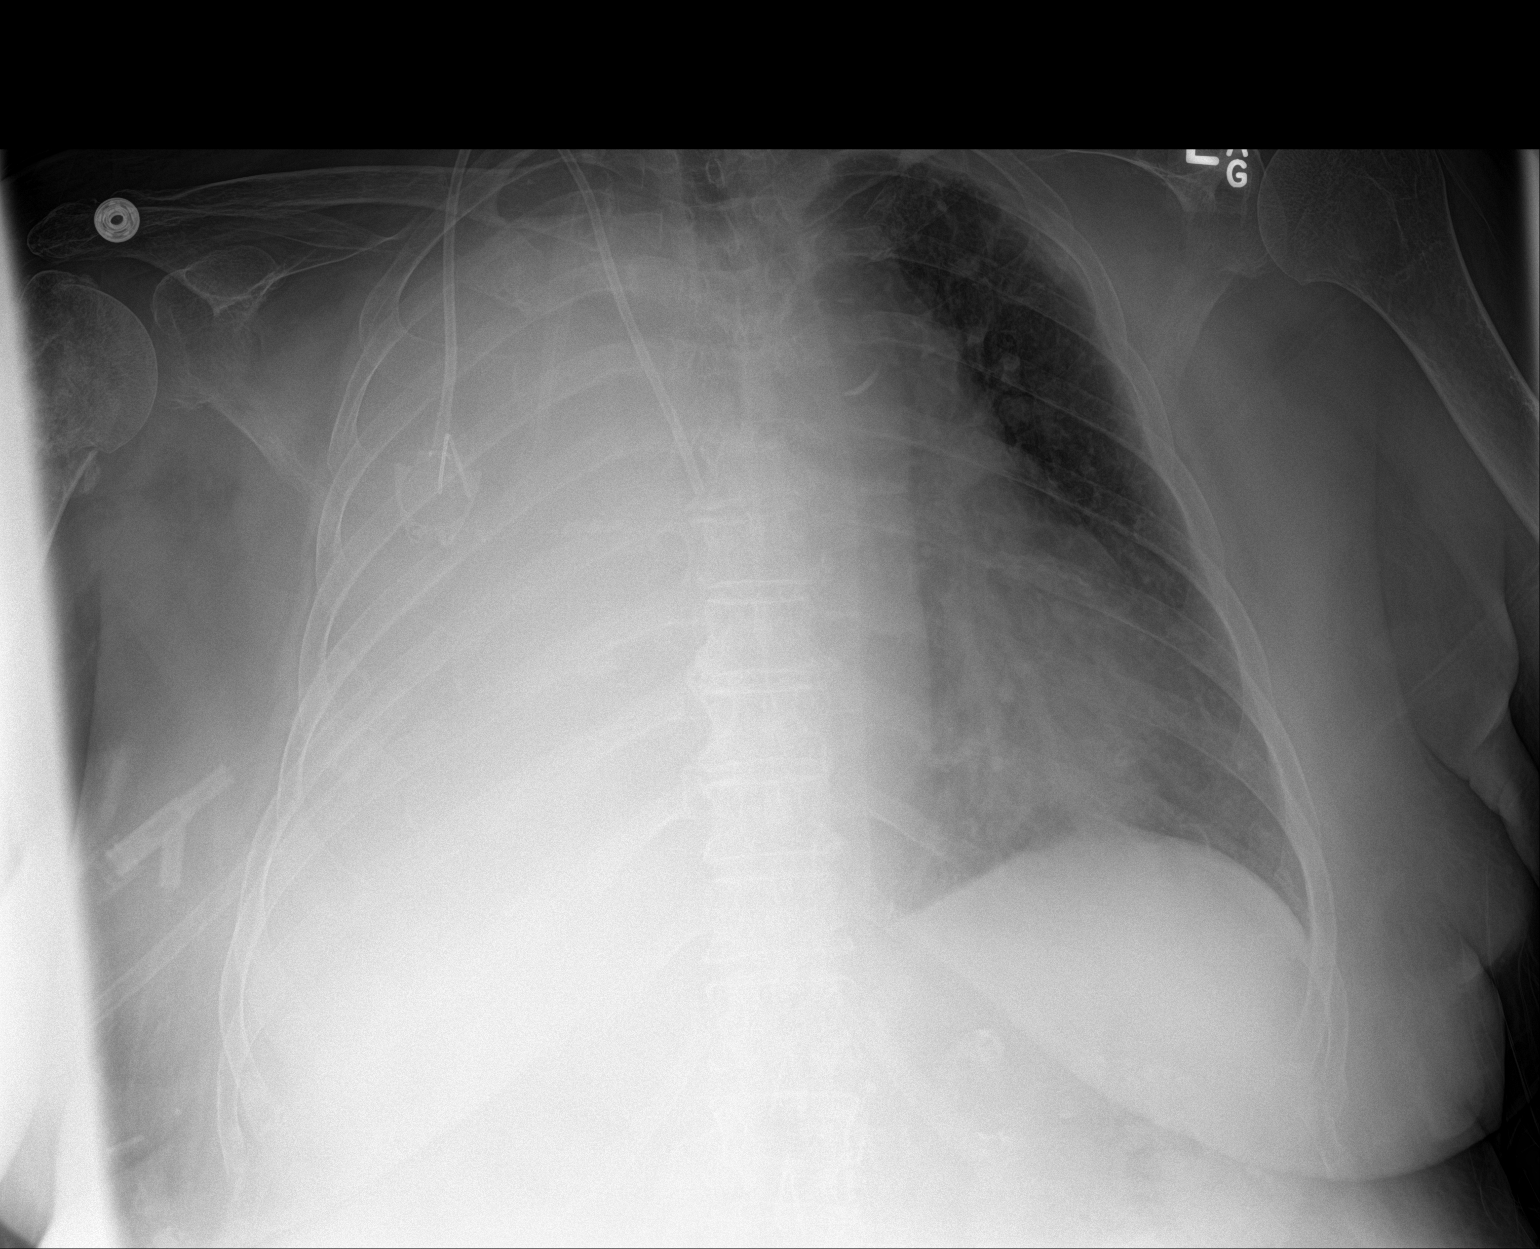

[1 of 1 positions shown; findings below may reference images not displayed]

FINDINGS: Portable AP semi upright view at 1422 hours. Re-accumulation of the
right pleural effusion now with recurrent complete opacification of
the right hemi thorax. Left lung ventilation appears stable. Visible
mediastinal contours are stable. Stable right chest porta cath,
currently accessed. Stable visualized osseous structures. Non healed
proximal right humerus fracture.
IMPRESSION: Recurrent complete opacification of the right hemi-thorax, likely
due to re-accumulation of right pleural effusion superimposed on the
widespread right lung consolidation seen by CT on 12/21/2015.
